# Patient Record
Sex: Female | Born: 1940
Health system: Southern US, Community
[De-identification: ages and names within clinical notes are randomized; demographics above are authoritative.]

## PROBLEM LIST (undated history)

## (undated) DIAGNOSIS — J301 Allergic rhinitis due to pollen: Secondary | ICD-10-CM

## (undated) DIAGNOSIS — H61019 Acute perichondritis of external ear, unspecified ear: Secondary | ICD-10-CM

## (undated) DIAGNOSIS — M899 Disorder of bone, unspecified: Secondary | ICD-10-CM

## (undated) DIAGNOSIS — Z79899 Other long term (current) drug therapy: Secondary | ICD-10-CM

## (undated) DIAGNOSIS — E039 Hypothyroidism, unspecified: Secondary | ICD-10-CM

## (undated) DIAGNOSIS — M949 Disorder of cartilage, unspecified: Secondary | ICD-10-CM

## (undated) DIAGNOSIS — F410 Panic disorder [episodic paroxysmal anxiety] without agoraphobia: Secondary | ICD-10-CM

## (undated) DIAGNOSIS — M1990 Unspecified osteoarthritis, unspecified site: Secondary | ICD-10-CM

## (undated) DIAGNOSIS — Z9289 Personal history of other medical treatment: Secondary | ICD-10-CM

## (undated) DIAGNOSIS — E785 Hyperlipidemia, unspecified: Secondary | ICD-10-CM

## (undated) DIAGNOSIS — L578 Other skin changes due to chronic exposure to nonionizing radiation: Secondary | ICD-10-CM

## (undated) HISTORY — DX: Disorder of cartilage, unspecified: M94.9

## (undated) HISTORY — DX: Other skin changes due to chronic exposure to nonionizing radiation: L57.8

## (undated) HISTORY — PX: OTHER SURGICAL HISTORY: SHX169

## (undated) HISTORY — DX: Allergic rhinitis due to pollen: J30.1

## (undated) HISTORY — DX: Personal history of other medical treatment: Z92.89

## (undated) HISTORY — DX: Hypothyroidism, unspecified: E03.9

## (undated) HISTORY — DX: Hyperlipidemia, unspecified: E78.5

## (undated) HISTORY — DX: Unspecified osteoarthritis, unspecified site: M19.90

## (undated) HISTORY — DX: Acute perichondritis of external ear, unspecified ear: H61.019

## (undated) HISTORY — DX: Other long term (current) drug therapy: Z79.899

## (undated) HISTORY — PX: COLONOSCOPY: SHX174

## (undated) HISTORY — DX: Panic disorder (episodic paroxysmal anxiety): F41.0

## (undated) HISTORY — DX: Disorder of bone, unspecified: M89.9

---
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
## 2009-01-03 LAB — HM DEXA SCAN

## 2011-02-03 LAB — HM MAMMOGRAPHY: HM Mammogram: NEGATIVE

## 2012-12-30 ENCOUNTER — Encounter: Payer: Self-pay | Admitting: *Deleted

## 2013-01-02 ENCOUNTER — Ambulatory Visit (INDEPENDENT_AMBULATORY_CARE_PROVIDER_SITE_OTHER): Payer: Medicare Other | Admitting: Internal Medicine

## 2013-01-02 ENCOUNTER — Other Ambulatory Visit: Payer: Self-pay | Admitting: *Deleted

## 2013-01-02 ENCOUNTER — Encounter: Payer: Self-pay | Admitting: Internal Medicine

## 2013-01-02 VITALS — BP 142/80 | HR 72 | Temp 97.5°F | Resp 18 | Ht 65.0 in | Wt 169.0 lb

## 2013-01-02 DIAGNOSIS — IMO0002 Reserved for concepts with insufficient information to code with codable children: Secondary | ICD-10-CM

## 2013-01-02 DIAGNOSIS — M170 Bilateral primary osteoarthritis of knee: Secondary | ICD-10-CM | POA: Insufficient documentation

## 2013-01-02 DIAGNOSIS — E785 Hyperlipidemia, unspecified: Secondary | ICD-10-CM | POA: Insufficient documentation

## 2013-01-02 DIAGNOSIS — M1710 Unilateral primary osteoarthritis, unspecified knee: Secondary | ICD-10-CM

## 2013-01-02 DIAGNOSIS — F329 Major depressive disorder, single episode, unspecified: Secondary | ICD-10-CM

## 2013-01-02 DIAGNOSIS — E039 Hypothyroidism, unspecified: Secondary | ICD-10-CM | POA: Insufficient documentation

## 2013-01-02 DIAGNOSIS — F3289 Other specified depressive episodes: Secondary | ICD-10-CM

## 2013-01-02 DIAGNOSIS — E559 Vitamin D deficiency, unspecified: Secondary | ICD-10-CM | POA: Insufficient documentation

## 2013-01-02 MED ORDER — LEVOTHYROXINE SODIUM 25 MCG PO TABS: ORAL_TABLET | ORAL | Status: DC

## 2013-01-02 NOTE — Progress Notes (Signed)
Patient ID: Patricia Cantu, female   DOB: 1941/05/01, 72 y.o.   MRN: [id number] Code Status: Has living will and husband, Rocky Link is designated HCPOA with her daughter as secondary  Allergies  Allergen Reactions  . Seasonal Ic (Cholestatin)     Chief Complaint  Patient presents with  . Knee Pain    for one month    HPI: Patient is a 72 y.o. female seen in the office today for bilateral knee pain for a month.  Used to be able to stoop down to weed.  A few days ago she was unable to get up without holding onto something.  Thinks now that maybe she overused her muscles during the snow with sledding with grandchildren.  Discussed whether this could be due to statin.  Knees also bothered her a little bit during her tai chi class.    Was concerned that her bp was at 142/80 here again today.  Has historically been in 120s.  Has not checked outside of the office--meant to but forgot.    Is up to date on pneumonia, tetanus and had her flu shot for the season.  No longer requires pap smears.  Is set up with [facility] to get her yellow fever, hepatitis series.    Review of Systems:  Review of Systems  Constitutional: Negative for fever, chills and weight loss.  Eyes: Negative for blurred vision.  Respiratory: Negative for cough and shortness of breath.   Cardiovascular: Negative for chest pain and palpitations.  Gastrointestinal: Negative for heartburn, nausea, vomiting, abdominal pain, diarrhea and constipation.  Genitourinary: Negative for dysuria, urgency and frequency.  Musculoskeletal: Positive for joint pain.  Skin: Negative for rash.       Only aging skin changes  Neurological: Negative for dizziness and headaches.  Psychiatric/Behavioral: Negative for depression.  All other systems reviewed and are negative.     Past Medical History  Diagnosis Date  . Acute perichondritis of pinna   . Unspecified hypothyroidism   . Mixed hyperlipidemia   . Anxiety state, unspecified   . Allergic  rhinitis due to pollen   . Unspecified dermatitis due to sun   . Disorder of bone and cartilage, unspecified   . Encounter for long-term (current) use of other medications    Past Surgical History  Procedure Laterality Date  . Tonsillectomy  1949    removed   Social History:   reports that she has never smoked. She has never used smokeless tobacco. She reports that she drinks about 1.2 ounces of alcohol per week. She reports that she does not use illicit drugs.  Family History  Problem Relation Age of Onset  . Stroke Mother   . Heart disease Father   . Hyperlipidemia Sister   . Heart disease Sister   . Thyroid disease Sister   . Heart disease Son   . Thyroid disease Son   . Thyroid disease Daughter     Medications: Patient's Medications  New Prescriptions   No medications on file  Previous Medications   ATORVASTATIN (LIPITOR) 10 MG TABLET    Take 10 mg by mouth daily. Take one tablet once a day at bedtime for cholesterol   CALCIUM CARBONATE 1250 MG CAPSULE    Take 1,250 mg by mouth 2 (two) times daily with a meal. Take one tablet once a day for calcium supplement   CHOLECALCIFEROL (VITAMIN D) 1000 UNITS TABLET    Take 1,000 Units by mouth daily.   CITALOPRAM (CELEXA) 20 MG TABLET  Take 20 mg by mouth daily. Take one tablet once a day for depression   FISH OIL-OMEGA-3 FATTY ACIDS 1000 MG CAPSULE    Take 2 g by mouth daily. Take one tablet once a day.   FLUTICASONE (FLONASE) 50 MCG/ACT NASAL SPRAY    Place 2 sprays into the nose daily. Use two sprays into each nostril daily   LEVOTHYROXINE (SYNTHROID, LEVOTHROID) 25 MCG TABLET    Take 25 mcg by mouth daily. Take one tablet twice a day for thyroid   MULTIPLE VITAMIN (MULTIVITAMIN) TABLET    Take 1 tablet by mouth daily. Take one tablet once a day  Modified Medications   No medications on file  Discontinued Medications   No medications on file     Physical Exam: Physical Exam  Constitutional: She is oriented to person,  place, and time. She appears well-developed and well-nourished. No distress.  HENT:  Head: Normocephalic and atraumatic.  Cardiovascular: Normal rate, regular rhythm, normal heart sounds and intact distal pulses.   Pulmonary/Chest: Effort normal and breath sounds normal.  Abdominal: Soft. Bowel sounds are normal.  Musculoskeletal: Normal range of motion. She exhibits no edema and no tenderness.  Bilateral knees with crepitus present but able to squat down and stand up again today without use of the counter.    Neurological: She is alert and oriented to person, place, and time.  Skin: Skin is warm and dry.  Psychiatric: She has a normal mood and affect. Her behavior is normal. Judgment and thought content normal.    Filed Vitals:   01/02/13 0853  BP: 142/80  Pulse: 72  Temp: 97.5 F (36.4 C)  TempSrc: Oral  Resp: 18  Height: 5\' 5"  (1.651 m)  Weight: 169 lb (76.658 kg)  SpO2: 99%    Labs reviewed:  All previous labs were in misys: 10/06/2012 CBC WBC 5.2, RBC 4.31, HGB 13.3 CMP; Glucose 86, BUN 22, Creatinine 0.99 Lipid Panel; Cholesterol 227, Triglycerides 113, HDL 63, LDL 141 TSH 2.750   Procedures:   2005-Colonoscopy--did have benign polyps, irritation, done in [place] 2010-Mammogram: Negative 03/08/2009-Pelvic Ultrasound: No significant abnormality identified on pelvic ultrasonography  2011-Pap Smear 01/2009-Bone Density- osteopenia 08/23/2010-Stress Test: No evidence of myocardial ischemia by exercise nuclear stress testing. Normal regional wall motion, calculated LVEF 66%. Exercise protocol reported separately. 2011-Echocardiogram 06/10/2010-Carotid Study: No evidence of significant stenosis of the internal carotid arteries. 02/2011-Mammogram: Negative 04/06/2012 - Mammogram  Assessment/Plan Osteoarthritis of both knees Increased stiffness after sledding with grandchildren.  Has now resolved.  I highly doubt this is statin-related due to its time-limited nature and lack of  associated pain.  Has known b/l knee osteoarthritis.  Is using aleve for the short-term for pain.  Should not be used long term  Hyperlipidemia LDL goal < 100 Continue on statin.  Recheck lipids as planned prior to July visit.  Has not been doing very well with her diet and exercise b/c she is saying she is stuck with the medicine anyway.  Says she will get back to it.  Unspecified vitamin D deficiency Continues on vitamin D.  Monitor levels.  Balance is fabulous between this and tai chi.    Unspecified hypothyroidism Continue synthroid po daily and f/u levels before routine visit in July.  Depressive disorder, not elsewhere classified Has improved dramatically, but she is afraid to go off her citalopram completely for fear of returning to having periods of anxiety and panic.  She has reduced to every other day with continued benefit and  no return of symptoms.     Labs/tests ordered:  CMP, FLP ordered before July visit from last routine visit.

## 2013-01-02 NOTE — Assessment & Plan Note (Signed)
Increased stiffness after sledding with grandchildren.  Has now resolved.  I highly doubt this is statin-related due to its time-limited nature and lack of associated pain.  Has known b/l knee osteoarthritis.  Is using aleve for the short-term for pain.  Should not be used long term

## 2013-01-02 NOTE — Assessment & Plan Note (Signed)
Continue synthroid po daily and f/u levels before routine visit in July.

## 2013-01-02 NOTE — Assessment & Plan Note (Signed)
Continues on vitamin D.  Monitor levels.  Balance is fabulous between this and tai chi.

## 2013-01-02 NOTE — Assessment & Plan Note (Signed)
Has improved dramatically, but she is afraid to go off her citalopram completely for fear of returning to having periods of anxiety and panic.  She has reduced to every other day with continued benefit and no return of symptoms.

## 2013-01-02 NOTE — Progress Notes (Deleted)
Patient ID: Patricia Cantu, female   DOB: 30-Nov-1940, 72 y.o.   MRN: [id number] Physical Exam

## 2013-01-02 NOTE — Assessment & Plan Note (Signed)
Continue on statin.  Recheck lipids as planned prior to July visit.  Has not been doing very well with her diet and exercise b/c she is saying she is stuck with the medicine anyway.  Says she will get back to it.

## 2013-02-10 ENCOUNTER — Other Ambulatory Visit: Payer: Self-pay | Admitting: *Deleted

## 2013-02-10 DIAGNOSIS — E782 Mixed hyperlipidemia: Secondary | ICD-10-CM

## 2013-02-10 DIAGNOSIS — Z0000 Encounter for general adult medical examination without abnormal findings: Secondary | ICD-10-CM

## 2013-02-10 DIAGNOSIS — E039 Hypothyroidism, unspecified: Secondary | ICD-10-CM

## 2013-02-17 ENCOUNTER — Ambulatory Visit (INDEPENDENT_AMBULATORY_CARE_PROVIDER_SITE_OTHER): Payer: Self-pay | Admitting: Internal Medicine

## 2013-02-17 DIAGNOSIS — Z789 Other specified health status: Secondary | ICD-10-CM

## 2013-02-17 DIAGNOSIS — Z23 Encounter for immunization: Secondary | ICD-10-CM

## 2013-02-17 MED ORDER — TYPHOID VACCINE PO CPDR: 1.0000 | DELAYED_RELEASE_CAPSULE | ORAL | Status: DC

## 2013-02-17 MED ORDER — AZITHROMYCIN 500 MG PO TABS: 500.0000 mg | ORAL_TABLET | Freq: Every day | ORAL | Status: DC

## 2013-02-17 NOTE — Progress Notes (Signed)
[facility] NOTE  RFV: [place] and [place] trip inc 14 day cruise Subjective:    Patient ID: Patricia Cantu, female    DOB: 1940-11-05, 72 y.o.   MRN: [id number]  HPI 72yo F leaving with her husband for a trip to [place] and [place] from oct 24th throu nov 10th, which includes a 14 day cruise.  Previous vac: flu, hep b  All: nkma Meds: citalopram, levothyroxin, artovastatin, mvi  Previous travel [place] [address], [place], [place], [place], [place], [place], [place], [place], [place], [place], [place],     Review of Systems     Objective:   Physical Exam        Assessment & Plan:  Provided pre travel counseling plus hep A and typhoid vaccination. No need for malaria or yellow fever  Traveler's diarrhea = gave rx for azithromycin and gave tips sheet

## 2013-02-20 ENCOUNTER — Encounter: Payer: Self-pay | Admitting: Internal Medicine

## 2013-02-20 ENCOUNTER — Ambulatory Visit (INDEPENDENT_AMBULATORY_CARE_PROVIDER_SITE_OTHER): Payer: Medicare Other | Admitting: Internal Medicine

## 2013-02-20 VITALS — BP 140/80 | HR 78 | Temp 98.2°F | Resp 18 | Ht 65.5 in | Wt 169.0 lb

## 2013-02-20 DIAGNOSIS — R55 Syncope and collapse: Secondary | ICD-10-CM

## 2013-02-20 DIAGNOSIS — I491 Atrial premature depolarization: Secondary | ICD-10-CM | POA: Insufficient documentation

## 2013-02-20 DIAGNOSIS — I499 Cardiac arrhythmia, unspecified: Secondary | ICD-10-CM

## 2013-02-20 DIAGNOSIS — T671XXA Heat syncope, initial encounter: Secondary | ICD-10-CM

## 2013-02-20 NOTE — Assessment & Plan Note (Signed)
Noted on EKG.  Doubt this is related to her near syncopal episode.  She is currently asymptomatic and does not even note palpitations.  Q-T interval was normal.

## 2013-02-20 NOTE — Assessment & Plan Note (Signed)
Suspect this was related to heat, overactivity and hypovolemia at the time.  No carotid bruits were noted.  I did note PACs during cardiac exam and EKG was done that revealed the same.  I encouraged hydration and not overdoing it when she goes to the gym.  She is to notify me if she has the return of any of the visual changes and feeling that she has to sit down.

## 2013-02-20 NOTE — Progress Notes (Signed)
Patient ID: Patricia Cantu, female   DOB: 1941-03-11, 72 y.o.   MRN: [id number] Code Status:  Has living will, husband is HCPOA  Allergies  Allergen Reactions  . Seasonal Ic (Cholestatin)     Chief Complaint  Patient presents with  . Acute Visit    hot spell and tunnel vision after exercising    HPI: Patient is a 72 y.o. white female seen in the office today for acute visit with hot spell and tunnel vision after exercising.  Walked 3 miles, worked in yard, ate, didn't drink a lot, went to tai chi ball, then a lot of upper body at her regular 45 min tai chi class at the club at [facility].  Felt like she had a black cloud around her eyes.  Went to bathroom to cool down.  Went back and it happened again.  Drank OJ and ate, sat a while. They wanted her to see someone about it.  They didn't have a bp cuff.  No dizziness, no pain.  No visual changes otherwise.  No speech changes.    Review of Systems:  Review of Systems  Constitutional: Negative for fever, chills, weight loss, malaise/fatigue and diaphoresis.  Eyes: Negative for blurred vision and double vision.  Respiratory: Negative for shortness of breath.   Cardiovascular: Negative for chest pain, palpitations, orthopnea, leg swelling and PND.  Gastrointestinal: Negative for constipation.  Genitourinary: Negative for dysuria.  Musculoskeletal: Negative for myalgias and falls.  Skin: Negative for rash.  Neurological: Negative for dizziness, loss of consciousness, weakness and headaches.       Presyncope  Psychiatric/Behavioral: Negative for depression and memory loss. The patient is not nervous/anxious and does not have insomnia.      Past Medical History  Diagnosis Date  . Acute perichondritis of pinna   . Unspecified hypothyroidism   . Mixed hyperlipidemia   . Anxiety state, unspecified   . Allergic rhinitis due to pollen   . Unspecified dermatitis due to sun   . Disorder of bone and cartilage, unspecified   . Encounter for  long-term (current) use of other medications    Past Surgical History  Procedure Laterality Date  . Tonsillectomy  1949    removed   Social History:   reports that she has never smoked. She has never used smokeless tobacco. She reports that she drinks about 1.2 ounces of alcohol per week. She reports that she does not use illicit drugs.  Family History  Problem Relation Age of Onset  . Stroke Mother   . Heart disease Father   . Hyperlipidemia Sister   . Heart disease Sister   . Thyroid disease Sister   . Heart disease Son   . Thyroid disease Son   . Thyroid disease Daughter     Medications: Patient's Medications  New Prescriptions   No medications on file  Previous Medications   ATORVASTATIN (LIPITOR) 10 MG TABLET    Take 10 mg by mouth daily. Take one tablet once a day at bedtime for cholesterol   AZITHROMYCIN (ZITHROMAX) 500 MG TABLET    Take 1 tablet (500 mg total) by mouth daily.   CALCIUM CARBONATE 1250 MG CAPSULE    Take 1,250 mg by mouth 2 (two) times daily with a meal. Take one tablet once a day for calcium supplement   CHOLECALCIFEROL (VITAMIN D) 1000 UNITS TABLET    Take 1,000 Units by mouth daily.   CITALOPRAM (CELEXA) 20 MG TABLET    Take 20 mg by  mouth daily. Take one tablet once a day for depression   FISH OIL-OMEGA-3 FATTY ACIDS 1000 MG CAPSULE    Take 2 g by mouth daily. Take one tablet once a day.   FLUTICASONE (FLONASE) 50 MCG/ACT NASAL SPRAY    Place 2 sprays into the nose daily. Use two sprays into each nostril daily   MULTIPLE VITAMIN (MULTIVITAMIN) TABLET    Take 1 tablet by mouth daily. Take one tablet once a day   TYPHOID (VIVOTIF BERNA VACCINE) DR CAPSULE    Take 1 capsule by mouth every other day.  Modified Medications   Modified Medication Previous Medication   LEVOTHYROXINE (SYNTHROID, LEVOTHROID) 25 MCG TABLET levothyroxine (SYNTHROID, LEVOTHROID) 25 MCG tablet      Take one and one half tablet once a day for thyroid    Take one tablet twice a day  for thyroid  Discontinued Medications   No medications on file    Physical Exam:  Filed Vitals:   02/20/13 1157  BP: 140/80  Pulse: 78  Temp: 98.2 F (36.8 C)  TempSrc: Oral  Resp: 18  Height: 5' 5.5" (1.664 m)  Weight: 169 lb (76.658 kg)  SpO2: 97%   Physical Exam  Constitutional: She is oriented to person, place, and time. She appears well-developed and well-nourished. No distress.  HENT:  Head: Normocephalic and atraumatic.  Eyes: Pupils are equal, round, and reactive to light.  Neck: No JVD present.  Cardiovascular: Normal rate, normal heart sounds and intact distal pulses.   No carotid bruits audible,  PACs audible  Pulmonary/Chest: Effort normal and breath sounds normal. No respiratory distress. She has no wheezes. She has no rales. She exhibits no tenderness.  Abdominal: Soft. Bowel sounds are normal.  Musculoskeletal: Normal range of motion.  Neurological: She is alert and oriented to person, place, and time. She has normal reflexes. No cranial nerve deficit.  Skin: Skin is warm and dry.  Psychiatric: She has a normal mood and affect. Her behavior is normal. Judgment and thought content normal.   Assessment/Plan Heat causing syncope Suspect this was related to heat, overactivity and hypovolemia at the time.  No carotid bruits were noted.  I did note PACs during cardiac exam and EKG was done that revealed the same.  I encouraged hydration and not overdoing it when she goes to the gym.  She is to notify me if she has the return of any of the visual changes and feeling that she has to sit down.    Premature atrial complexes Noted on EKG.  Doubt this is related to her near syncopal episode.  She is currently asymptomatic and does not even note palpitations.  Q-T interval was normal.   Labs/tests ordered:  EKG was done here today.  No further testing ordered at present.  Keep routine appt in July.

## 2013-03-06 ENCOUNTER — Other Ambulatory Visit: Payer: Self-pay | Admitting: *Deleted

## 2013-03-06 MED ORDER — ATORVASTATIN CALCIUM 10 MG PO TABS: 10.0000 mg | ORAL_TABLET | Freq: Every day | ORAL | Status: DC

## 2013-04-25 ENCOUNTER — Other Ambulatory Visit: Payer: Medicare Other

## 2013-04-25 ENCOUNTER — Other Ambulatory Visit: Payer: Self-pay | Admitting: Internal Medicine

## 2013-04-27 ENCOUNTER — Encounter: Payer: Self-pay | Admitting: Internal Medicine

## 2013-04-27 ENCOUNTER — Ambulatory Visit (INDEPENDENT_AMBULATORY_CARE_PROVIDER_SITE_OTHER): Payer: Medicare Other | Admitting: Internal Medicine

## 2013-04-27 VITALS — BP 138/78 | HR 64 | Temp 98.2°F | Resp 18 | Ht 65.5 in | Wt 165.8 lb

## 2013-04-27 DIAGNOSIS — E785 Hyperlipidemia, unspecified: Secondary | ICD-10-CM

## 2013-04-27 DIAGNOSIS — E039 Hypothyroidism, unspecified: Secondary | ICD-10-CM

## 2013-04-27 MED ORDER — ATORVASTATIN CALCIUM 10 MG PO TABS: 10.0000 mg | ORAL_TABLET | Freq: Every day | ORAL | Status: DC

## 2013-04-27 NOTE — Progress Notes (Signed)
Patient ID: Patricia Cantu, female   DOB: 05-05-1941, 72 y.o.   MRN: [id number] Location:  [facility] [place]  Code Status: Has living will and [name] is husband  Allergies  Allergen Reactions  . Seasonal Ic (Cholestatin)     Chief Complaint  Patient presents with  . Follow-up    HPI: Patient is a 72 y.o. white female seen in the office today for routine med mgt of chronic conditions. Had mammogram Monday--was normal. Had precancerous place on left cheek removed.   Lost a few lbs.  Goal is 155 lbs, she says.   BP borderline.   Review of Systems:  Review of Systems  Constitutional: Positive for weight loss. Negative for fever, chills and malaise/fatigue.  HENT: Negative for congestion.   Eyes: Negative for blurred vision.  Respiratory: Negative for shortness of breath.   Cardiovascular: Positive for palpitations. Negative for chest pain and leg swelling.  Gastrointestinal: Negative for abdominal pain, diarrhea, constipation, blood in stool and melena.  Genitourinary: Negative for dysuria.  Musculoskeletal: Negative for myalgias, back pain and falls.       Left foot toes overlap   Skin: Negative for rash.  Neurological: Negative for dizziness, weakness and headaches.  Psychiatric/Behavioral: Negative for depression and memory loss. The patient is not nervous/anxious and does not have insomnia.     Past Medical History  Diagnosis Date  . Acute perichondritis of pinna   . Unspecified hypothyroidism   . Mixed hyperlipidemia   . Anxiety state, unspecified   . Allergic rhinitis due to pollen   . Unspecified dermatitis due to sun   . Disorder of bone and cartilage, unspecified   . Encounter for long-term (current) use of other medications     Past Surgical History  Procedure Laterality Date  . Tonsillectomy  1949    removed    Social History:   reports that she has never smoked. She has never used smokeless tobacco. She reports that she  drinks about 1.2 ounces of alcohol per week. She reports that she does not use illicit drugs.  Family History  Problem Relation Age of Onset  . Stroke Mother   . Heart disease Father   . Hyperlipidemia Sister   . Heart disease Sister   . Thyroid disease Sister   . Heart disease Son   . Thyroid disease Son   . Thyroid disease Daughter     Medications: Patient's Medications  New Prescriptions   No medications on file  Previous Medications   ATORVASTATIN (LIPITOR) 10 MG TABLET    Take 1 tablet (10 mg total) by mouth daily. For cholesterol   AZITHROMYCIN (ZITHROMAX) 500 MG TABLET    Take 1 tablet (500 mg total) by mouth daily.   CALCIUM CARBONATE 1250 MG CAPSULE    Take 1,250 mg by mouth 2 (two) times daily with a meal. Take one tablet once a day for calcium supplement   CHOLECALCIFEROL (VITAMIN D) 1000 UNITS TABLET    Take 1,000 Units by mouth daily.   CITALOPRAM (CELEXA) 20 MG TABLET    Take 20 mg by mouth daily. Take one tablet once a day for depression   FISH OIL-OMEGA-3 FATTY ACIDS 1000 MG CAPSULE    Take 2 g by mouth daily. Take one tablet once a day.   FLUTICASONE (FLONASE) 50 MCG/ACT NASAL SPRAY    Place 2 sprays into the nose daily. Use two sprays into each nostril daily   LEVOTHYROXINE (SYNTHROID, LEVOTHROID) 25  MCG TABLET    Take one and one half tablet once a day for thyroid   MULTIPLE VITAMIN (MULTIVITAMIN) TABLET    Take 1 tablet by mouth daily. Take one tablet once a day   TYPHOID (VIVOTIF BERNA VACCINE) DR CAPSULE    Take 1 capsule by mouth every other day.  Modified Medications   No medications on file  Discontinued Medications   No medications on file     Physical Exam: Filed Vitals:   04/27/13 0821  BP: 138/78  Pulse: 64  Temp: 98.2 F (36.8 C)  TempSrc: Oral  Resp: 18  Height: 5' 5.5" (1.664 m)  Weight: 165 lb 12.8 oz (75.206 kg)  SpO2: 99%  Physical Exam  Constitutional: She is oriented to person, place, and time. She appears well-developed and  well-nourished. No distress.  HENT:  Head: Normocephalic and atraumatic.  Cardiovascular: Normal rate, regular rhythm, normal heart sounds and intact distal pulses.   Pulmonary/Chest: Effort normal and breath sounds normal. No respiratory distress.  Abdominal: Soft. Bowel sounds are normal. She exhibits no distension. There is no tenderness.  Musculoskeletal: Normal range of motion. She exhibits no edema and no tenderness.  Left second toe overlaps first  Neurological: She is alert and oriented to person, place, and time.  Skin: Skin is warm and dry.  Psychiatric: She has a normal mood and affect.   Labs reviewed:   04/25/13:  Cbc nl, cmp nl, FLP:  Tc 166, TG 133, HDL 65, LDL 74  Past Procedures: Mammogram Mon, 7/21--normal  Assessment/Plan 1. Hyperlipidemia LDL goal < 100 -at goal with medication, exercise - atorvastatin (LIPITOR) 10 MG tablet; Take 1 tablet (10 mg total) by mouth daily. For cholesterol  Dispense: 90 tablet; Refill: 5 - Basic metabolic panel; Future  2. Unspecified hypothyroidism - continue synthroid - TSH; Future  Labs/tests ordered:  Bmp, tsh before next visit Next appt:  6 mos

## 2013-05-04 LAB — COMPREHENSIVE METABOLIC PANEL
ALT: 14 IU/L (ref 0–32)
AST: 21 IU/L (ref 0–40)
Albumin/Globulin Ratio: 1.9 (ref 1.1–2.5)
Albumin: 4.1 g/dL (ref 3.5–4.8)
Alkaline Phosphatase: 54 IU/L (ref 39–117)
BUN/Creatinine Ratio: 20 (ref 11–26)
BUN: 20 mg/dL (ref 8–27)
CO2: 25 mmol/L (ref 18–29)
Calcium: 9.3 mg/dL (ref 8.6–10.2)
Chloride: 101 mmol/L (ref 97–108)
Creatinine, Ser: 0.99 mg/dL (ref 0.57–1.00)
GFR calc Af Amer: 67 mL/min/{1.73_m2} (ref >59)
GFR calc non Af Amer: 58 mL/min/{1.73_m2} — ABNORMAL LOW (ref >59)
Globulin, Total: 2.2 g/dL (ref 1.5–4.5)
Glucose: 82 mg/dL (ref 65–99)
Potassium: 4.3 mmol/L (ref 3.5–5.2)
Sodium: 140 mmol/L (ref 134–144)
Total Bilirubin: 0.7 mg/dL (ref 0.0–1.2)
Total Protein: 6.3 g/dL (ref 6.0–8.5)

## 2013-05-04 LAB — CBC WITH DIFFERENTIAL
Basophils Absolute: 0 10*3/uL (ref 0.0–0.2)
Basos: 1 % (ref 0–3)
Eos: 3 % (ref 0–5)
Eosinophils Absolute: 0.2 10*3/uL (ref 0.0–0.4)
HCT: 41 % (ref 34.0–46.6)
Hemoglobin: 13.9 g/dL (ref 11.1–15.9)
Immature Grans (Abs): 0 10*3/uL (ref 0.0–0.1)
Immature Granulocytes: 0 % (ref 0–2)
Lymphocytes Absolute: 1.9 10*3/uL (ref 0.7–3.1)
Lymphs: 24 % (ref 14–46)
MCH: 31.4 pg (ref 26.6–33.0)
MCHC: 33.9 g/dL (ref 31.5–35.7)
MCV: 93 fL (ref 79–97)
Monocytes Absolute: 0.8 10*3/uL (ref 0.1–0.9)
Monocytes: 10 % (ref 4–12)
Neutrophils Absolute: 4.8 10*3/uL (ref 1.4–7.0)
Neutrophils Relative %: 62 % (ref 40–74)
Platelets: 256 10*3/uL (ref 150–379)
RBC: 4.42 x10E6/uL (ref 3.77–5.28)
RDW: 13.8 % (ref 12.3–15.4)
WBC: 7.7 10*3/uL (ref 3.4–10.8)

## 2013-05-04 LAB — LIPID PANEL WITH LDL/HDL RATIO
Cholesterol, Total: 166 mg/dL (ref 100–199)
HDL: 65 mg/dL (ref >39)
LDL Calculated: 74 mg/dL (ref 0–99)
LDl/HDL Ratio: 1.1 ratio units (ref 0.0–3.2)
Triglycerides: 133 mg/dL (ref 0–149)
VLDL Cholesterol Cal: 27 mg/dL (ref 5–40)

## 2013-05-16 ENCOUNTER — Encounter: Payer: Self-pay | Admitting: Internal Medicine

## 2013-06-13 ENCOUNTER — Ambulatory Visit: Payer: Medicare Other

## 2013-06-15 ENCOUNTER — Ambulatory Visit (INDEPENDENT_AMBULATORY_CARE_PROVIDER_SITE_OTHER): Payer: Medicare Other

## 2013-06-15 DIAGNOSIS — Z23 Encounter for immunization: Secondary | ICD-10-CM

## 2013-09-11 ENCOUNTER — Other Ambulatory Visit: Payer: Self-pay | Admitting: *Deleted

## 2013-09-11 ENCOUNTER — Other Ambulatory Visit: Payer: Self-pay | Admitting: Internal Medicine

## 2013-09-11 MED ORDER — LEVOTHYROXINE SODIUM 25 MCG PO TABS: ORAL_TABLET | ORAL | Status: DC

## 2013-09-21 ENCOUNTER — Other Ambulatory Visit: Payer: Self-pay | Admitting: Nurse Practitioner

## 2013-10-10 ENCOUNTER — Other Ambulatory Visit: Payer: Medicare Other

## 2013-10-12 ENCOUNTER — Ambulatory Visit: Payer: Medicare Other | Admitting: Internal Medicine

## 2013-11-21 ENCOUNTER — Other Ambulatory Visit: Payer: Medicare Other

## 2013-11-23 ENCOUNTER — Ambulatory Visit (INDEPENDENT_AMBULATORY_CARE_PROVIDER_SITE_OTHER): Payer: Medicare Other | Admitting: Internal Medicine

## 2013-11-23 ENCOUNTER — Encounter: Payer: Self-pay | Admitting: Internal Medicine

## 2013-11-23 ENCOUNTER — Ambulatory Visit: Payer: Medicare Other | Admitting: Internal Medicine

## 2013-11-23 VITALS — BP 138/82 | HR 93 | Temp 99.0°F | Resp 18 | Ht 65.5 in | Wt 173.0 lb

## 2013-11-23 DIAGNOSIS — F3289 Other specified depressive episodes: Secondary | ICD-10-CM

## 2013-11-23 DIAGNOSIS — E785 Hyperlipidemia, unspecified: Secondary | ICD-10-CM

## 2013-11-23 DIAGNOSIS — B379 Candidiasis, unspecified: Secondary | ICD-10-CM

## 2013-11-23 DIAGNOSIS — E039 Hypothyroidism, unspecified: Secondary | ICD-10-CM

## 2013-11-23 DIAGNOSIS — G4700 Insomnia, unspecified: Secondary | ICD-10-CM | POA: Insufficient documentation

## 2013-11-23 DIAGNOSIS — E663 Overweight: Secondary | ICD-10-CM

## 2013-11-23 DIAGNOSIS — M1710 Unilateral primary osteoarthritis, unspecified knee: Secondary | ICD-10-CM

## 2013-11-23 DIAGNOSIS — M170 Bilateral primary osteoarthritis of knee: Secondary | ICD-10-CM

## 2013-11-23 DIAGNOSIS — E559 Vitamin D deficiency, unspecified: Secondary | ICD-10-CM

## 2013-11-23 DIAGNOSIS — F329 Major depressive disorder, single episode, unspecified: Secondary | ICD-10-CM

## 2013-11-23 DIAGNOSIS — IMO0002 Reserved for concepts with insufficient information to code with codable children: Secondary | ICD-10-CM

## 2013-11-23 MED ORDER — ZOLPIDEM TARTRATE 5 MG PO TABS: 2.5000 mg | ORAL_TABLET | Freq: Every evening | ORAL | Status: DC | PRN

## 2013-11-23 MED ORDER — NYSTATIN 100000 UNIT/GM EX CREA: 1.0000 "application " | TOPICAL_CREAM | Freq: Two times a day (BID) | CUTANEOUS | Status: DC

## 2013-11-23 NOTE — Progress Notes (Signed)
Patient ID: Patricia Cantu, female   DOB: 1941/04/12, 73 y.o.   MRN: [id number]   Location:  [facility]  Code Status: has living will and hcpoa--reviewed today  Allergies  Allergen Reactions  . Seasonal Ic [Cholestatin]     Chief Complaint  Patient presents with  . Medical Managment of Chronic Issues  . Acute Visit    needs sleeping pill    HPI: Patient is a 73 y.o. white female seen in the office today for medical mgt of chronic diseases.  Feels fine. Is doing well.  Had a bug, but got rid of it.   Asks about laser hair removal. Asks about rashes from bras.  Discussed nystatin cream. Left second toe hammer toe. Weight up a little, but is using fitbit--doing 10000 steps a day (5x per week), losing weight and eating right.  Was up 5 lbs over the holiday.   Did not sleep on plane on way back Benadryl and pms don't work Going to [place] next week, and wants to take a 1/2 [place] and see if she can sleep.  Needs to help with tour of people.   Concerned about lack of intimacy at this point.   Rarely using celexa now, but feels like walking more is helping her.  Sometimes uses 3 1/2 pills, sometimes none at all.   Needs physical next time. Knees improved with walking more and doing more knee-focused tai chi.  Going down steps is sometimes painful.  Review of Systems:  Review of Systems  Constitutional: Negative for fever, chills, weight loss and malaise/fatigue.  HENT: Negative for hearing loss.   Eyes: Negative for blurred vision.  Respiratory: Negative for shortness of breath.   Cardiovascular: Negative for chest pain.  Gastrointestinal: Negative for constipation.  Genitourinary: Negative for dysuria, urgency and frequency.  Musculoskeletal: Positive for joint pain. Negative for falls.       Knees  Skin: Negative for rash.  Neurological: Negative for dizziness and weakness.  Psychiatric/Behavioral: Negative for depression and memory  loss. The patient has insomnia.        During travel    Past Medical History  Diagnosis Date  . Acute perichondritis of pinna   . Unspecified hypothyroidism   . Mixed hyperlipidemia   . Anxiety state, unspecified   . Allergic rhinitis due to pollen   . Unspecified dermatitis due to sun   . Disorder of bone and cartilage, unspecified   . Encounter for long-term (current) use of other medications     Past Surgical History  Procedure Laterality Date  . Tonsillectomy  1949    removed    Social History:   reports that she has never smoked. She has never used smokeless tobacco. She reports that she drinks about 1.2 ounces of alcohol per week. She reports that she does not use illicit drugs.  Family History  Problem Relation Age of Onset  . Stroke Mother   . Heart disease Father   . Hyperlipidemia Sister   . Heart disease Sister   . Thyroid disease Sister   . Heart disease Son   . Thyroid disease Son   . Thyroid disease Daughter     Medications: Patient's Medications  New Prescriptions   NYSTATIN CREAM (MYCOSTATIN)    Apply 1 application topically 2 (two) times daily. To area beneath breasts as needed for rash   ZOLPIDEM (AMBIEN) 5 MG TABLET    Take 0.5 tablets (2.5 mg total) by mouth at  bedtime as needed for sleep.  Previous Medications   ATORVASTATIN (LIPITOR) 10 MG TABLET    Take 1 tablet (10 mg total) by mouth daily. For cholesterol   CALCIUM CARBONATE 1250 MG CAPSULE    Take 1,250 mg by mouth 2 (two) times daily with a meal. Take one tablet once a day for calcium supplement   CHOLECALCIFEROL (VITAMIN D) 1000 UNITS TABLET    Take 1,000 Units by mouth daily.   CITALOPRAM (CELEXA) 20 MG TABLET    TAKE 1 TABLET BY MOUTH ONCE DAILY.   FISH OIL-OMEGA-3 FATTY ACIDS 1000 MG CAPSULE    Take 2 g by mouth daily. Take one tablet once a day.   FLUTICASONE (FLONASE) 50 MCG/ACT NASAL SPRAY    INSTILL 2 SPRAYS IN EACH NOSTRIL ONCE DAILY.   LEVOTHYROXINE (SYNTHROID, LEVOTHROID) 25 MCG  TABLET    Take one and one half tablet once a day for thyroid   MULTIPLE VITAMIN (MULTIVITAMIN) TABLET    Take 1 tablet by mouth daily. Take one tablet once a day  Modified Medications   No medications on file  Discontinued Medications   AZITHROMYCIN (ZITHROMAX) 500 MG TABLET    Take 1 tablet (500 mg total) by mouth daily.   TYPHOID (VIVOTIF BERNA VACCINE) DR CAPSULE    Take 1 capsule by mouth every other day.     Physical Exam: Filed Vitals:   11/23/13 0738  BP: 138/82  Pulse: 93  Temp: 99 F (37.2 C)  TempSrc: Oral  Resp: 18  Height: 5' 5.5" (1.664 m)  Weight: 173 lb (78.472 kg)  SpO2: 96%  Physical Exam  Constitutional: She is oriented to person, place, and time. She appears well-developed and well-nourished. No distress.  Cardiovascular: Normal rate, regular rhythm, normal heart sounds and intact distal pulses.   Pulmonary/Chest: Effort normal and breath sounds normal. No respiratory distress.  Musculoskeletal: Normal range of motion. She exhibits no edema and no tenderness.  Hammer toe left second toe onto left great toe; crepitus of bilateral knees  Neurological: She is alert and oriented to person, place, and time.  Skin: Skin is warm and dry. There is pallor.  Psychiatric: She has a normal mood and affect.    Labs reviewed: Basic Metabolic Panel:  Recent Labs  04/25/13 0818  NA 140  K 4.3  CL 101  CO2 25  GLUCOSE 82  BUN 20  CREATININE 0.99  CALCIUM 9.3   Liver Function Tests:  Recent Labs  04/25/13 0818  AST 21  ALT 14  ALKPHOS 54  BILITOT 0.7  PROT 6.3   CBC:  Recent Labs  04/25/13 0818  WBC 7.7  NEUTROABS 4.8  HGB 13.9  HCT 41.0  MCV 93  PLT 256   Lipid Panel:  Recent Labs  04/25/13 0818  HDL 65  LDLCALC 74  TRIG 133   Past Procedures: Mammogram normal  Assessment/Plan Osteoarthritis of both knees Doing much better with increased walking.  Continue this for lubrication and cont tai chi also.    Hyperlipidemia LDL goal <  100 F/u flp today.  Had a bit of weight gain over the holidays but is now back on her diet. Cont atorvastatin.  Unspecified vitamin D deficiency F/u level today.  Con ca with D, and vit D 1000 units daily.  May add another 1000 units if levels remain below 40.  Unspecified hypothyroidism F/u tsh.  Continue current dose of synthroid.  Depressive disorder, not elsewhere classified Mood has been quite good  since walking more also and has not needed celexa.  She may stop this altogether due to rare use anyway--reviewed that this is not typically effective prn anyway.  Insomnia Unable to sleep on the plane when traveling to other countries--has upcoming trip where she needs to be alert and help with the tour in [place].  She requests just a few ambien tablets to take for that purpose only--otherwise sleeps well.     Labs/tests ordered:   Orders Placed This Encounter  Procedures  . HM MAMMOGRAPHY    This external order was created through the Results Console.  Marland Kitchen HM DEXA SCAN    This external order was created through the Results Console.  . TSH  . Lipid panel    Order Specific Question:  Has the patient fasted?    Answer:  Yes  . Hemoglobin A1c  . Comprehensive metabolic panel    Order Specific Question:  Has the patient fasted?    Answer:  Yes  . CBC with Differential  . Vitamin D, 25-hydroxy   Next appt:  6 months EV

## 2013-11-23 NOTE — Assessment & Plan Note (Signed)
F/u level today.  Con ca with D, and vit D 1000 units daily.  May add another 1000 units if levels remain below 40.

## 2013-11-23 NOTE — Assessment & Plan Note (Signed)
Unable to sleep on the plane when traveling to other countries--has upcoming trip where she needs to be alert and help with the tour in [place].  She requests just a few ambien tablets to take for that purpose only--otherwise sleeps well.

## 2013-11-23 NOTE — Assessment & Plan Note (Addendum)
F/u flp today.  Had a bit of weight gain over the holidays but is now back on her diet. Cont atorvastatin.

## 2013-11-23 NOTE — Assessment & Plan Note (Signed)
F/u tsh.  Continue current dose of synthroid.

## 2013-11-23 NOTE — Assessment & Plan Note (Signed)
Mood has been quite good since walking more also and has not needed celexa.  She may stop this altogether due to rare use anyway--reviewed that this is not typically effective prn anyway.

## 2013-11-23 NOTE — Assessment & Plan Note (Signed)
Doing much better with increased walking.  Continue this for lubrication and cont tai chi also.

## 2013-11-24 LAB — CBC WITH DIFFERENTIAL/PLATELET
Basophils Absolute: 0 10*3/uL (ref 0.0–0.2)
Basos: 1 %
Eos: 3 %
Eosinophils Absolute: 0.2 10*3/uL (ref 0.0–0.4)
HCT: 39.8 % (ref 34.0–46.6)
Hemoglobin: 13.6 g/dL (ref 11.1–15.9)
Immature Grans (Abs): 0 10*3/uL (ref 0.0–0.1)
Immature Granulocytes: 0 %
Lymphocytes Absolute: 1.4 10*3/uL (ref 0.7–3.1)
Lymphs: 23 %
MCH: 31.1 pg (ref 26.6–33.0)
MCHC: 34.2 g/dL (ref 31.5–35.7)
MCV: 91 fL (ref 79–97)
Monocytes Absolute: 0.8 10*3/uL (ref 0.1–0.9)
Monocytes: 13 %
Neutrophils Absolute: 3.7 10*3/uL (ref 1.4–7.0)
Neutrophils Relative %: 60 %
RBC: 4.38 x10E6/uL (ref 3.77–5.28)
RDW: 13.8 % (ref 12.3–15.4)
WBC: 6.1 10*3/uL (ref 3.4–10.8)

## 2013-11-24 LAB — COMPREHENSIVE METABOLIC PANEL
ALT: 10 IU/L (ref 0–32)
AST: 14 IU/L (ref 0–40)
Albumin/Globulin Ratio: 2 (ref 1.1–2.5)
Albumin: 4.1 g/dL (ref 3.5–4.8)
Alkaline Phosphatase: 62 IU/L (ref 39–117)
BUN/Creatinine Ratio: 15 (ref 11–26)
BUN: 14 mg/dL (ref 8–27)
CO2: 26 mmol/L (ref 18–29)
Calcium: 9.1 mg/dL (ref 8.7–10.3)
Chloride: 97 mmol/L (ref 97–108)
Creatinine, Ser: 0.96 mg/dL (ref 0.57–1.00)
GFR calc Af Amer: 69 mL/min/{1.73_m2} (ref >59)
GFR calc non Af Amer: 60 mL/min/{1.73_m2} (ref >59)
Globulin, Total: 2.1 g/dL (ref 1.5–4.5)
Glucose: 93 mg/dL (ref 65–99)
Potassium: 4.4 mmol/L (ref 3.5–5.2)
Sodium: 138 mmol/L (ref 134–144)
Total Bilirubin: 0.4 mg/dL (ref 0.0–1.2)
Total Protein: 6.2 g/dL (ref 6.0–8.5)

## 2013-11-24 LAB — LIPID PANEL
Chol/HDL Ratio: 2.7 ratio units (ref 0.0–4.4)
Cholesterol, Total: 167 mg/dL (ref 100–199)
HDL: 62 mg/dL (ref >39)
LDL Calculated: 68 mg/dL (ref 0–99)
Triglycerides: 184 mg/dL — ABNORMAL HIGH (ref 0–149)
VLDL Cholesterol Cal: 37 mg/dL (ref 5–40)

## 2013-11-24 LAB — TSH: TSH: 2.12 u[IU]/mL (ref 0.450–4.500)

## 2013-11-24 LAB — VITAMIN D 25 HYDROXY (VIT D DEFICIENCY, FRACTURES): Vit D, 25-Hydroxy: 29.1 ng/mL — ABNORMAL LOW (ref 30.0–100.0)

## 2013-11-24 LAB — HEMOGLOBIN A1C
Est. average glucose Bld gHb Est-mCnc: 120 mg/dL
Hgb A1c MFr Bld: 5.8 % — ABNORMAL HIGH (ref 4.8–5.6)

## 2013-12-13 ENCOUNTER — Other Ambulatory Visit: Payer: Medicare Other

## 2013-12-15 ENCOUNTER — Ambulatory Visit: Payer: Medicare Other | Admitting: Internal Medicine

## 2014-01-02 ENCOUNTER — Ambulatory Visit: Payer: Medicare Other

## 2014-01-18 ENCOUNTER — Ambulatory Visit (INDEPENDENT_AMBULATORY_CARE_PROVIDER_SITE_OTHER): Payer: Medicare Other | Admitting: *Deleted

## 2014-01-18 DIAGNOSIS — Z23 Encounter for immunization: Secondary | ICD-10-CM

## 2014-03-13 ENCOUNTER — Encounter (HOSPITAL_COMMUNITY): Payer: Self-pay | Admitting: Emergency Medicine

## 2014-03-13 ENCOUNTER — Emergency Department (HOSPITAL_COMMUNITY)
Admission: EM | Admit: 2014-03-13 | Discharge: 2014-03-13 | Disposition: A | Discharge status: Home / Self Care | Payer: Medicare Other | Attending: Emergency Medicine | Admitting: Emergency Medicine

## 2014-03-13 DIAGNOSIS — R110 Nausea: Secondary | ICD-10-CM | POA: Insufficient documentation

## 2014-03-13 DIAGNOSIS — R55 Syncope and collapse: Secondary | ICD-10-CM

## 2014-03-13 DIAGNOSIS — Z872 Personal history of diseases of the skin and subcutaneous tissue: Secondary | ICD-10-CM | POA: Insufficient documentation

## 2014-03-13 DIAGNOSIS — E860 Dehydration: Secondary | ICD-10-CM | POA: Insufficient documentation

## 2014-03-13 DIAGNOSIS — E039 Hypothyroidism, unspecified: Secondary | ICD-10-CM | POA: Insufficient documentation

## 2014-03-13 DIAGNOSIS — Z8659 Personal history of other mental and behavioral disorders: Secondary | ICD-10-CM | POA: Insufficient documentation

## 2014-03-13 DIAGNOSIS — Z8739 Personal history of other diseases of the musculoskeletal system and connective tissue: Secondary | ICD-10-CM | POA: Insufficient documentation

## 2014-03-13 DIAGNOSIS — E785 Hyperlipidemia, unspecified: Secondary | ICD-10-CM | POA: Insufficient documentation

## 2014-03-13 DIAGNOSIS — Z79899 Other long term (current) drug therapy: Secondary | ICD-10-CM | POA: Insufficient documentation

## 2014-03-13 LAB — CBC
HCT: 38.7 % (ref 36.0–46.0)
HEMOGLOBIN: 13.4 g/dL (ref 12.0–15.0)
MCH: 31.2 pg (ref 26.0–34.0)
MCHC: 34.6 g/dL (ref 30.0–36.0)
MCV: 90 fL (ref 78.0–100.0)
Platelets: 257 10*3/uL (ref 150–400)
RBC: 4.3 MIL/uL (ref 3.87–5.11)
RDW: 13.3 % (ref 11.5–15.5)
WBC: 12 10*3/uL — ABNORMAL HIGH (ref 4.0–10.5)

## 2014-03-13 LAB — URINALYSIS, ROUTINE W REFLEX MICROSCOPIC
GLUCOSE, UA: NEGATIVE mg/dL
HGB URINE DIPSTICK: NEGATIVE
Ketones, ur: 15 mg/dL — AB
Nitrite: NEGATIVE
PROTEIN: NEGATIVE mg/dL
SPECIFIC GRAVITY, URINE: 1.023 (ref 1.005–1.030)
UROBILINOGEN UA: 0.2 mg/dL (ref 0.0–1.0)
pH: 5 (ref 5.0–8.0)

## 2014-03-13 LAB — URINE MICROSCOPIC-ADD ON

## 2014-03-13 LAB — BASIC METABOLIC PANEL WITH GFR
BUN: 24 mg/dL — ABNORMAL HIGH (ref 6–23)
CO2: 26 meq/L (ref 19–32)
Calcium: 10.3 mg/dL (ref 8.4–10.5)
Chloride: 102 meq/L (ref 96–112)
Creatinine, Ser: 1.12 mg/dL — ABNORMAL HIGH (ref 0.50–1.10)
GFR calc Af Amer: 56 mL/min — ABNORMAL LOW
GFR calc non Af Amer: 48 mL/min — ABNORMAL LOW
Glucose, Bld: 88 mg/dL (ref 70–99)
Potassium: 4.2 meq/L (ref 3.7–5.3)
Sodium: 141 meq/L (ref 137–147)

## 2014-03-13 MED ORDER — SODIUM CHLORIDE 0.9 % IV BOLUS (SEPSIS): 1000.0000 mL | Freq: Once | INTRAVENOUS | Status: DC

## 2014-03-13 NOTE — Discharge Instructions (Signed)
Dehydration, Adult Dehydration is when you lose more fluids from the body than you take in. Vital organs like the kidneys, brain, and heart cannot function without a proper amount of fluids and salt. Any loss of fluids from the body can cause dehydration.  CAUSES   Vomiting.  Diarrhea.  Excessive sweating.  Excessive urine output.  Fever. SYMPTOMS  Mild dehydration  Thirst.  Dry lips.  Slightly dry mouth. Moderate dehydration  Very dry mouth.  Sunken eyes.  Skin does not bounce back quickly when lightly pinched and released.  Dark urine and decreased urine production.  Decreased tear production.  Headache. Severe dehydration  Very dry mouth.  Extreme thirst.  Rapid, weak pulse (more than 100 beats per minute at rest).  Cold hands and feet.  Not able to sweat in spite of heat and temperature.  Rapid breathing.  Blue lips.  Confusion and lethargy.  Difficulty being awakened.  Minimal urine production.  No tears. DIAGNOSIS  Your caregiver will diagnose dehydration based on your symptoms and your exam. Blood and urine tests will help confirm the diagnosis. The diagnostic evaluation should also identify the cause of dehydration. TREATMENT  Treatment of mild or moderate dehydration can often be done at home by increasing the amount of fluids that you drink. It is best to drink small amounts of fluid more often. Drinking too much at one time can make vomiting worse. Refer to the home care instructions below. Severe dehydration needs to be treated at the hospital where you will probably be given intravenous (IV) fluids that contain water and electrolytes. HOME CARE INSTRUCTIONS   Ask your caregiver about specific rehydration instructions.  Drink enough fluids to keep your urine clear or pale yellow.  Drink small amounts frequently if you have nausea and vomiting.  Eat as you normally do.  Avoid:  Foods or drinks high in sugar.  Carbonated  drinks.  Juice.  Extremely hot or cold fluids.  Drinks with caffeine.  Fatty, greasy foods.  Alcohol.  Tobacco.  Overeating.  Gelatin desserts.  Wash your hands well to avoid spreading bacteria and viruses.  Only take over-the-counter or prescription medicines for pain, discomfort, or fever as directed by your caregiver.  Ask your caregiver if you should continue all prescribed and over-the-counter medicines.  Keep all follow-up appointments with your caregiver. SEEK MEDICAL CARE IF:  You have abdominal pain and it increases or stays in one area (localizes).  You have a rash, stiff neck, or severe headache.  You are irritable, sleepy, or difficult to awaken.  You are weak, dizzy, or extremely thirsty. SEEK IMMEDIATE MEDICAL CARE IF:   You are unable to keep fluids down or you get worse despite treatment.  You have frequent episodes of vomiting or diarrhea.  You have blood or green matter (bile) in your vomit.  You have blood in your stool or your stool looks black and tarry.  You have not urinated in 6 to 8 hours, or you have only urinated a small amount of very dark urine.  You have a fever.  You faint. MAKE SURE YOU:   Understand these instructions.  Will watch your condition.  Will get help right away if you are not doing well or get worse. Document Released: 09/21/2005 Document Revised: 12/14/2011 Document Reviewed: 05/11/2011 [employer] Patient Information 2014 [employer].  

## 2014-03-13 NOTE — ED Provider Notes (Signed)
CSN: [id number]     Arrival date & time 03/13/14  1347 History   First MD Initiated Contact with Patient 03/13/14 1503     Chief Complaint  Patient presents with  . Near Syncope     (Consider location/radiation/quality/duration/timing/severity/associated sxs/prior Treatment) HPI 73 year old female presents about 1-2 hours after having near-syncope. She was playing 9 holes of golf (walking while pulling bag behind her). On 6th hole she started to feel overall weak and tired. Had not drank any water so tried this and finished the round. Was more weak and feeling lightheaded at the end of the round. Went inside and people told her to lay flat, put cold compresses on head and gave her fluids and fruit. She feels improved and no longer feels like she's going to pass out. MIldly weak now but is significantly improved. Temperature outside has been ~85 today. No chest pain, dyspnea, headaches or focal weakness. No urinary symptoms.   Past Medical History  Diagnosis Date  . Acute perichondritis of pinna   . Unspecified hypothyroidism   . Mixed hyperlipidemia   . Anxiety state, unspecified   . Allergic rhinitis due to pollen   . Unspecified dermatitis due to sun   . Disorder of bone and cartilage, unspecified   . Encounter for long-term (current) use of other medications    Past Surgical History  Procedure Laterality Date  . Tonsillectomy  1949    removed   Family History  Problem Relation Age of Onset  . Stroke Mother   . Heart disease Father   . Hyperlipidemia Sister   . Heart disease Sister   . Thyroid disease Sister   . Heart disease Son   . Thyroid disease Son   . Thyroid disease Daughter    History  Substance Use Topics  . Smoking status: Never Smoker   . Smokeless tobacco: Never Used  . Alcohol Use: 1.2 oz/week    2 Glasses of wine per week   OB History   Grav Para Term Preterm Abortions TAB SAB Ect Mult Living                 Review of Systems  Constitutional:  Positive for fatigue. Negative for fever.  Respiratory: Negative for shortness of breath.   Cardiovascular: Negative for chest pain.  Gastrointestinal: Positive for nausea (during the episode, none now). Negative for vomiting and abdominal pain.  Genitourinary: Negative for dysuria.  Neurological: Positive for weakness and light-headedness.  All other systems reviewed and are negative.     Allergies  Seasonal ic  Home Medications   Prior to Admission medications   Medication Sig Start Date End Date Taking? Authorizing Provider  atorvastatin (LIPITOR) 10 MG tablet Take 1 tablet (10 mg total) by mouth daily. For cholesterol 04/27/13  Yes Tiffany L Reed, DO  CALCIUM PO Take 1 tablet by mouth daily.   Yes Historical Provider, MD  cholecalciferol (VITAMIN D) 1000 UNITS tablet Take 1,000 Units by mouth daily.   Yes Historical Provider, MD  fish oil-omega-3 fatty acids 1000 MG capsule Take 2 g by mouth daily. Take one tablet once a day.   Yes Historical Provider, MD  levothyroxine (SYNTHROID, LEVOTHROID) 25 MCG tablet Take 25 mcg by mouth daily before breakfast.   Yes Historical Provider, MD  Multiple Vitamin (MULTIVITAMIN) tablet Take 1 tablet by mouth daily.    Yes Historical Provider, MD  nystatin cream (MYCOSTATIN) Apply 1 application topically 2 (two) times daily. To area beneath breasts  as needed for rash 11/23/13  Yes Tiffany L Reed, DO   BP 122/59  Pulse 64  Temp(Src) 97.7 F (36.5 C) (Oral)  Resp 15  Ht 5' 5.5" (1.664 m)  Wt 175 lb (79.379 kg)  BMI 28.67 kg/m2  SpO2 98% Physical Exam  Nursing note and vitals reviewed. Constitutional: She is oriented to person, place, and time. She appears well-developed and well-nourished. No distress.  HENT:  Head: Normocephalic and atraumatic.  Right Ear: External ear normal.  Left Ear: External ear normal.  Nose: Nose normal.  Mildly dry mucous membranes  Eyes: EOM are normal. Pupils are equal, round, and reactive to light. Right eye  exhibits no discharge. Left eye exhibits no discharge.  Cardiovascular: Normal rate, regular rhythm and normal heart sounds.   No murmur heard. Pulmonary/Chest: Effort normal and breath sounds normal.  Abdominal: Soft. There is no tenderness.  Neurological: She is alert and oriented to person, place, and time.  CN 2-12 grossly intact. 5/5 strength in all 4 extremities. Normal gait  Skin: Skin is warm and dry.    ED Course  Procedures (including critical care time) Labs Review Labs Reviewed  CBC - Abnormal; Notable for the following:    WBC 12.0 (*)    All other components within normal limits  BASIC METABOLIC PANEL - Abnormal; Notable for the following:    BUN 24 (*)    Creatinine, Ser 1.12 (*)    GFR calc non Af Amer 48 (*)    GFR calc Af Amer 56 (*)    All other components within normal limits  URINALYSIS, ROUTINE W REFLEX MICROSCOPIC - Abnormal; Notable for the following:    Color, Urine AMBER (*)    APPearance CLOUDY (*)    Bilirubin Urine SMALL (*)    Ketones, ur 15 (*)    Leukocytes, UA TRACE (*)    All other components within normal limits  URINE MICROSCOPIC-ADD ON - Abnormal; Notable for the following:    Squamous Epithelial / LPF FEW (*)    Casts GRANULAR CAST (*)    All other components within normal limits    Imaging Review No results found.   EKG Interpretation   Date/Time:  Tuesday March 13 2014 13:53:49 EDT Ventricular Rate:  69 PR Interval:  173 QRS Duration: 72 QT Interval:  383 QTC Calculation: 410 R Axis:   48 Text Interpretation:  Sinus rhythm `no acute st/t changes No previous  tracing Confirmed by Ashok Cordia  MD, Lennette Bihari ([id number]) on 03/13/2014 2:08:03 PM      MDM   Final diagnoses:  Near syncope  Dehydration    Patient's symptoms are consistent with near-syncope due to dehydration and heat exposure. The patient feels better after eating and drinking in the ER. I believe that at this time she can orally rehydrate. She has a mild bump in her  creatinine from 0.9-1.2, and I will encourage her to increase her fluid intake at home. No other concerning findings on her workup. Is not consistent with a cardiac or neurologic cause of near-syncope. Patient was advised to return precautions and will follow up with her PCP.    Ephraim Hamburger, MD 03/13/14 [phone]

## 2014-03-13 NOTE — ED Notes (Signed)
Pt to department via EMS- pt reports that she played 9 holes of golf and then started feeling dizzy and light headed. States that she felt like she was going to pass out. Bp was in the 100's but improved. Denies any pain at this time. HR-80 29g LAC.

## 2014-03-13 NOTE — ED Notes (Signed)
Pt given iced water and lunch bag

## 2014-03-13 NOTE — ED Notes (Signed)
Pt comfortable with discharge and follow up instructions. No prescriptions. 

## 2014-04-05 ENCOUNTER — Telehealth: Payer: Self-pay

## 2014-04-05 DIAGNOSIS — E559 Vitamin D deficiency, unspecified: Secondary | ICD-10-CM

## 2014-04-05 DIAGNOSIS — E785 Hyperlipidemia, unspecified: Secondary | ICD-10-CM

## 2014-04-05 DIAGNOSIS — T887XXA Unspecified adverse effect of drug or medicament, initial encounter: Secondary | ICD-10-CM

## 2014-04-05 DIAGNOSIS — E039 Hypothyroidism, unspecified: Secondary | ICD-10-CM

## 2014-04-05 NOTE — Telephone Encounter (Signed)
Please schedule her a lab appointment.

## 2014-04-05 NOTE — Telephone Encounter (Signed)
Patient called triage line indicating she has a pending appointment for a annual exam in August and does not have an appointment for labs. Patient questions if she should have labs prior to appointment? Patient states she was in the ER early June for dehydration and had some labs. Patient would like for Dr.Reed to take that into consideration when ordering labs for annual.  I have pended some orders, Dr.Reed please advise if any additional labs to be drawn?

## 2014-04-05 NOTE — Telephone Encounter (Signed)
Spoke with patient's husband. Scheduled appointment for Wednesday 05/16/14

## 2014-04-18 ENCOUNTER — Other Ambulatory Visit: Payer: Self-pay | Admitting: Internal Medicine

## 2014-04-25 ENCOUNTER — Telehealth: Payer: Self-pay | Admitting: *Deleted

## 2014-04-25 NOTE — Telephone Encounter (Signed)
Patient called and stated that she had an episode of a panic attack this morning. She has stopped taking Celexa. She use to be on Ativan. States that she needs to go back on it. She took some of her daughters this morning and it calmed her down. Patient is leaving to go out of town next Tuesday. Please Advise.

## 2014-04-26 MED ORDER — LORAZEPAM 0.5 MG PO TABS: ORAL_TABLET | ORAL | Status: DC

## 2014-04-26 NOTE — Telephone Encounter (Signed)
Per Dr. Herbert Pun 0.5mg  by mouth daily as needed for panic attacks.  Patient Notified and phoned in Rx into pharmacy.

## 2014-04-26 NOTE — Telephone Encounter (Signed)
Let's put her back on what she was taking in misys.  It appears she has not been on it since we've been on epic.  Just give a 30 day supply.

## 2014-04-26 NOTE — Telephone Encounter (Signed)
Printed Patient's Misy's medication list and gave to Dr. Mariea Clonts to review.

## 2014-05-12 ENCOUNTER — Other Ambulatory Visit: Payer: Self-pay | Admitting: Internal Medicine

## 2014-05-16 ENCOUNTER — Other Ambulatory Visit: Payer: Medicare Other

## 2014-05-16 DIAGNOSIS — E039 Hypothyroidism, unspecified: Secondary | ICD-10-CM

## 2014-05-16 DIAGNOSIS — E559 Vitamin D deficiency, unspecified: Secondary | ICD-10-CM

## 2014-05-16 DIAGNOSIS — E785 Hyperlipidemia, unspecified: Secondary | ICD-10-CM

## 2014-05-16 DIAGNOSIS — T887XXA Unspecified adverse effect of drug or medicament, initial encounter: Secondary | ICD-10-CM

## 2014-05-17 LAB — CBC WITH DIFFERENTIAL/PLATELET
Basophils Absolute: 0 10*3/uL (ref 0.0–0.2)
Basos: 1 %
Eos: 5 %
Eosinophils Absolute: 0.3 10*3/uL (ref 0.0–0.4)
HCT: 41.1 % (ref 34.0–46.6)
Hemoglobin: 13.8 g/dL (ref 11.1–15.9)
Immature Grans (Abs): 0 10*3/uL (ref 0.0–0.1)
Immature Granulocytes: 0 %
Lymphocytes Absolute: 1.9 10*3/uL (ref 0.7–3.1)
Lymphs: 34 %
MCH: 31.1 pg (ref 26.6–33.0)
MCHC: 33.6 g/dL (ref 31.5–35.7)
MCV: 93 fL (ref 79–97)
Monocytes Absolute: 0.5 10*3/uL (ref 0.1–0.9)
Monocytes: 9 %
Neutrophils Absolute: 2.8 10*3/uL (ref 1.4–7.0)
Neutrophils Relative %: 51 %
RBC: 4.44 x10E6/uL (ref 3.77–5.28)
RDW: 14.2 % (ref 12.3–15.4)
WBC: 5.6 10*3/uL (ref 3.4–10.8)

## 2014-05-17 LAB — COMPREHENSIVE METABOLIC PANEL
ALT: 17 IU/L (ref 0–32)
AST: 19 IU/L (ref 0–40)
Albumin/Globulin Ratio: 2.1 (ref 1.1–2.5)
Albumin: 4.2 g/dL (ref 3.5–4.8)
Alkaline Phosphatase: 56 IU/L (ref 39–117)
BUN/Creatinine Ratio: 19 (ref 11–26)
BUN: 18 mg/dL (ref 8–27)
CO2: 24 mmol/L (ref 18–29)
Calcium: 9.3 mg/dL (ref 8.7–10.3)
Chloride: 99 mmol/L (ref 97–108)
Creatinine, Ser: 0.95 mg/dL (ref 0.57–1.00)
GFR calc Af Amer: 70 mL/min/{1.73_m2} (ref >59)
GFR calc non Af Amer: 60 mL/min/{1.73_m2} (ref >59)
Globulin, Total: 2 g/dL (ref 1.5–4.5)
Glucose: 86 mg/dL (ref 65–99)
Potassium: 4.3 mmol/L (ref 3.5–5.2)
Sodium: 139 mmol/L (ref 134–144)
Total Bilirubin: 0.7 mg/dL (ref 0.0–1.2)
Total Protein: 6.2 g/dL (ref 6.0–8.5)

## 2014-05-17 LAB — LIPID PANEL
Chol/HDL Ratio: 2.4 ratio units (ref 0.0–4.4)
Cholesterol, Total: 170 mg/dL (ref 100–199)
HDL: 70 mg/dL (ref >39)
LDL Calculated: 76 mg/dL (ref 0–99)
Triglycerides: 119 mg/dL (ref 0–149)
VLDL Cholesterol Cal: 24 mg/dL (ref 5–40)

## 2014-05-17 LAB — TSH: TSH: 2.98 u[IU]/mL (ref 0.450–4.500)

## 2014-05-17 LAB — VITAMIN D 25 HYDROXY (VIT D DEFICIENCY, FRACTURES): Vit D, 25-Hydroxy: 37.7 ng/mL (ref 30.0–100.0)

## 2014-05-18 ENCOUNTER — Encounter: Payer: Medicare Other | Admitting: Internal Medicine

## 2014-05-18 ENCOUNTER — Other Ambulatory Visit: Payer: Self-pay | Admitting: Internal Medicine

## 2014-05-21 ENCOUNTER — Encounter: Payer: Self-pay | Admitting: Internal Medicine

## 2014-05-21 ENCOUNTER — Ambulatory Visit (INDEPENDENT_AMBULATORY_CARE_PROVIDER_SITE_OTHER): Payer: Medicare Other | Admitting: Internal Medicine

## 2014-05-21 VITALS — BP 126/68 | HR 85 | Temp 97.9°F | Ht 64.5 in | Wt 181.0 lb

## 2014-05-21 DIAGNOSIS — E039 Hypothyroidism, unspecified: Secondary | ICD-10-CM

## 2014-05-21 DIAGNOSIS — R55 Syncope and collapse: Secondary | ICD-10-CM

## 2014-05-21 DIAGNOSIS — Z9289 Personal history of other medical treatment: Secondary | ICD-10-CM

## 2014-05-21 DIAGNOSIS — E559 Vitamin D deficiency, unspecified: Secondary | ICD-10-CM

## 2014-05-21 DIAGNOSIS — J309 Allergic rhinitis, unspecified: Secondary | ICD-10-CM

## 2014-05-21 DIAGNOSIS — Z1211 Encounter for screening for malignant neoplasm of colon: Secondary | ICD-10-CM

## 2014-05-21 DIAGNOSIS — Z9189 Other specified personal risk factors, not elsewhere classified: Secondary | ICD-10-CM

## 2014-05-21 DIAGNOSIS — R739 Hyperglycemia, unspecified: Secondary | ICD-10-CM

## 2014-05-21 DIAGNOSIS — E785 Hyperlipidemia, unspecified: Secondary | ICD-10-CM

## 2014-05-21 DIAGNOSIS — R7309 Other abnormal glucose: Secondary | ICD-10-CM

## 2014-05-21 MED ORDER — LEVOTHYROXINE SODIUM 25 MCG PO TABS: ORAL_TABLET | ORAL | Status: DC

## 2014-05-21 MED ORDER — FLUTICASONE PROPIONATE 50 MCG/ACT NA SUSP: 1.0000 | Freq: Every day | NASAL | Status: DC

## 2014-05-21 MED ORDER — ATORVASTATIN CALCIUM 10 MG PO TABS: ORAL_TABLET | ORAL | Status: DC

## 2014-05-21 NOTE — Progress Notes (Signed)
Patient ID: Patricia Cantu, female   DOB: [birth year], 73 y.o.   MRN: [id number]   Location:  [facility]  Code Status: has 5 wishes scanned into her chart from 11/28/13  Allergies  Allergen Reactions  . Seasonal Ic [Cholestatin]     Chief Complaint  Patient presents with  . Annual Exam    physical & discuss labs (printed), due for colonoscopy(2005 last one)  . other    still having feeling faint/awful when walking during the heat (what's going on ?)    HPI: Patient is a 73 y.o.  seen in the office today for annual exam.    She is due for her colonoscopy.  Routine screening.  Her main concern is a second episode of presyncope when out golfing.  First episode sent her to ED.  Has played gold many times w/o problems.  Both episodes, she was walking.  First time, was getting hot.  Could not pick up golf clubs to put them in the car.  Was sweating profusely.  Went to hospital.  EKG was normal at the time.  Tuesday was second episode, it wasn't even 80 degrees--decided to walk again and drinking lots of water and some gatorade.  Played great, got off golf course, sweated profusely, was told she looked awful, had some feeling of darkness.  Grabbed lunch, went home and laid down.  No palpitations, does not feel like panic attack, but feels out of energy.  Did not wait an unusual time between breakfast and lunch when golfing.  Out 2-2.5 hrs (9 holes).  Last hba1c was 5.8 in February.    Week of 7/20, she and her husband had a bug--headache and diarrhea.  Got worn out.  Had a panic attack, lorazepam helped 1x.  No longer needed after that.  Had also stopped her celexa.  Drove to [facility] and back.  Had another bout of diarrhea 8/3.  Then had second presyncopal episode.  Had diarrhea again on 8/14 and this morning.  Has been eating more fruit.  Had a brother-in-law suddenly die after choking on a piece of candy.  Her sister found him in the garage, was devastated and she  didn't handle it well either.   Has gained weight and not eating right things with stress with her brother in [place] death.    Asks about [facility] diets.  Also uses my [employer].    Goes to [facility] for mammograms.  Wants to switch to a local place.  Agrees to switch to [employer], [facility].     Review of Systems:  Review of Systems  Constitutional: Negative for fever, chills, weight loss, malaise/fatigue and diaphoresis.  HENT: Negative for congestion and hearing loss.   Eyes: Negative for blurred vision.       New glasses  Respiratory: Negative for cough and shortness of breath.   Cardiovascular: Negative for chest pain, palpitations and leg swelling.  Gastrointestinal: Negative for heartburn, diarrhea, constipation, blood in stool and melena.  Genitourinary: Negative for dysuria, urgency and frequency.  Musculoskeletal: Negative for falls and myalgias.  Skin: Negative for rash.  Neurological: Positive for dizziness. Negative for sensory change, loss of consciousness, weakness and headaches.       Presyncope  Endo/Heme/Allergies: Does not bruise/bleed easily.  Psychiatric/Behavioral: Negative for depression and memory loss.    Past Medical History  Diagnosis Date  . Acute perichondritis of pinna   . Unspecified hypothyroidism   . Mixed hyperlipidemia   .  Anxiety state, unspecified   . Allergic rhinitis due to pollen   . Unspecified dermatitis due to sun   . Disorder of bone and cartilage, unspecified   . Encounter for long-term (current) use of other medications     Past Surgical History  Procedure Laterality Date  . Tonsillectomy  1949    removed    Social History:   reports that she has never smoked. She has never used smokeless tobacco. She reports that she drinks about 1.2 ounces of alcohol per week. She reports that she does not use illicit drugs.  Family History  Problem Relation Age of Onset  . Stroke Mother   . Heart disease  Father   . Hyperlipidemia Sister   . Heart disease Sister   . Thyroid disease Sister   . Heart disease Son   . Thyroid disease Son   . Thyroid disease Daughter     Medications: Patient's Medications  New Prescriptions   No medications on file  Previous Medications   CALCIUM PO    Take 1 tablet by mouth daily.   CHOLECALCIFEROL (VITAMIN D) 1000 UNITS TABLET    1,000 Units. Take 2 tablets daily   FISH OIL-OMEGA-3 FATTY ACIDS 1000 MG CAPSULE    Take 2 g by mouth daily. Take one tablet once a day.   LORAZEPAM (ATIVAN) 0.5 MG TABLET    Take one tablet by mouth once daily as needed for panic attacks   MULTIPLE VITAMIN (MULTIVITAMIN) TABLET    Take 1 tablet by mouth daily.    NYSTATIN CREAM (MYCOSTATIN)    Apply 1 application topically 2 (two) times daily. To area beneath breasts as needed for rash  Modified Medications   Modified Medication Previous Medication   ATORVASTATIN (LIPITOR) 10 MG TABLET atorvastatin (LIPITOR) 10 MG tablet      TAKE 1 TABLET BY MOUTH DAILY FOR CHOLESTEROL    TAKE 1 TABLET BY MOUTH DAILY FOR CHOLESTEROL   FLUTICASONE (FLONASE) 50 MCG/ACT NASAL SPRAY fluticasone (FLONASE) 50 MCG/ACT nasal spray      Place 1 spray into both nostrils daily.    Place into both nostrils daily.   LEVOTHYROXINE (SYNTHROID, LEVOTHROID) 25 MCG TABLET levothyroxine (SYNTHROID, LEVOTHROID) 25 MCG tablet      TAKE 1 AND 1/2 TABLETS BY MOUTH ONCE DAILY FOR THYROID.    TAKE 1 AND 1/2 TABLETS BY MOUTH ONCE DAILY FOR THYROID.  Discontinued Medications   LEVOTHYROXINE (SYNTHROID, LEVOTHROID) 25 MCG TABLET    Take 25 mcg by mouth daily before breakfast.     Physical Exam: Filed Vitals:   05/21/14 1349  BP: 126/68  Pulse: 85  Temp: 97.9 F (36.6 C)  TempSrc: Oral  Height: 5' 4.5" (1.638 m)  Weight: 181 lb (82.101 kg)  SpO2: 97%  Physical Exam  Constitutional: She is oriented to person, place, and time. She appears well-developed and well-nourished. No distress.  HENT:  Head:  Normocephalic and atraumatic.  Right Ear: External ear normal.  Left Ear: External ear normal.  Nose: Nose normal.  Mouth/Throat: Oropharynx is clear and moist. No oropharyngeal exudate.  Eyes: Conjunctivae and EOM are normal. Pupils are equal, round, and reactive to light.  Neck: Normal range of motion. Neck supple. No JVD present. No tracheal deviation present. No thyromegaly present.  Cardiovascular: Normal rate, regular rhythm, normal heart sounds and intact distal pulses.   Pulmonary/Chest: Effort normal and breath sounds normal. No respiratory distress. She exhibits no mass. Right breast exhibits no inverted nipple, no mass,  no nipple discharge, no skin change and no tenderness. Left breast exhibits no inverted nipple, no mass, no nipple discharge, no skin change and no tenderness.  Abdominal: Soft. Bowel sounds are normal. She exhibits no distension and no mass. There is no tenderness.  Musculoskeletal: Normal range of motion. She exhibits no edema and no tenderness.  Lymphadenopathy:    She has no cervical adenopathy.  Neurological: She is alert and oriented to person, place, and time. She has normal reflexes. No cranial nerve deficit.  Skin: Skin is warm and dry.  Psychiatric: She has a normal mood and affect. Her behavior is normal. Judgment and thought content normal.     Labs reviewed: Basic Metabolic Panel:  Recent Labs  11/23/13 0820 03/13/14 1532 05/16/14 0829  NA 138 141 139  K 4.4 4.2 4.3  CL 97 102 99  CO2 26 26 24   GLUCOSE 93 88 86  BUN 14 24* 18  CREATININE 0.96 1.12* 0.95  CALCIUM 9.1 10.3 9.3  TSH 2.120  --  2.980   Liver Function Tests:  Recent Labs  11/23/13 0820 05/16/14 0829  AST 14 19  ALT 10 17  ALKPHOS 62 56  BILITOT 0.4 0.7  PROT 6.2 6.2  CBC:  Recent Labs  11/23/13 0820 03/13/14 1532 05/16/14 0829  WBC 6.1 12.0* 5.6  NEUTROABS 3.7  --  2.8  HGB 13.6 13.4 13.8  HCT 39.8 38.7 41.1  MCV 91 90.0 93  PLT  --  257  --    Lipid  Panel:  Recent Labs  11/23/13 0820 05/16/14 0829  HDL 62 70  LDLCALC 68 76  TRIG 184* 119  CHOLHDL 2.7 2.4   Lab Results  Component Value Date   HGBA1C 5.9* 05/21/2014   Assessment/Plan 1. Near syncope - went to ED and workup was unremarkable there with normal EKG, labs -seems to me that she needed to eat something midway through the morning with her hyperglycemia on labs -f/u those labs before next visit: - Hemoglobin A1c; Future - CBC With differential/Platelet; Future - TSH; Future  2. Unspecified hypothyroidism -clinically euthyroid, cont current med - levothyroxine (SYNTHROID, LEVOTHROID) 25 MCG tablet; TAKE 1 AND 1/2 TABLETS BY MOUTH ONCE DAILY FOR THYROID.  Dispense: 135 tablet; Refill: 3 - TSH; Future  3. Other and unspecified hyperlipidemia - was at goal with lipitor, has been gaining weight--eating poorly  - atorvastatin (LIPITOR) 10 MG tablet; TAKE 1 TABLET BY MOUTH DAILY FOR CHOLESTEROL  Dispense: 90 tablet; Refill: 3 - Lipid panel; Future  4. Unspecified vitamin D deficiency -cont calcium and vitamin D supplements  5. Colon cancer screening - is due for her regular screening colonoscopy - Ambulatory referral to Gastroenterology  6. History of mammogram - also due for her mammogram - MM DIGITAL SCREENING BILATERAL; Future  7. Allergic rhinitis, unspecified allergic rhinitis type - continue nasal spray  - fluticasone (FLONASE) 50 MCG/ACT nasal spray; Place 1 spray into both nostrils daily.  Dispense: 16 g; Refill: 3  8. Hyperglycemia -agrees to work on her diet and exercise regimen to help with her weight and sugar -suspect she had a low glucose at the time of her presyncope, but she does not recall this being tested - Comprehensive metabolic panel; Future - Hemoglobin A1c; Future - Lipid panel; Future  Labs/tests ordered: Orders Placed This Encounter  Procedures  . MM DIGITAL SCREENING BILATERAL    [facility];04/24/2013, [facility] (IMAGES REQUESTED)            No  needs Fd/Dorothy        MEDICARE    Standing Status: Future     Number of Occurrences:      Standing Expiration Date: 07/21/2015    Order Specific Question:  Reason for exam:    Answer:  screening, previously went to [facility] in [place] Specific Question:  Preferred imaging location?    Answer:  [facility]  . Hemoglobin A1c    Standing Status: Future     Number of Occurrences: 1     Standing Expiration Date: 05/22/2015  . CBC With differential/Platelet    Standing Status: Future     Number of Occurrences:      Standing Expiration Date: 05/22/2015  . Comprehensive metabolic panel    Standing Status: Future     Number of Occurrences:      Standing Expiration Date: 05/22/2015  . Hemoglobin A1c    Standing Status: Future     Number of Occurrences:      Standing Expiration Date: 05/22/2015  . Lipid panel    Standing Status: Future     Number of Occurrences:      Standing Expiration Date: 05/22/2015  . TSH    Standing Status: Future     Number of Occurrences:      Standing Expiration Date: 05/22/2015  . Ambulatory referral to Gastroenterology    Referral Priority:  Routine    Referral Type:  Consultation    Referral Reason:  Specialty Services Required    Requested Specialty:  Gastroenterology    Number of Visits Requested:  1    Next appt:  6 mos with labs before

## 2014-05-21 NOTE — Patient Instructions (Signed)
Hydrate adequately (at least 6-8 8oz glasses of water per day) Take a small snack along when you go golfing like a banana or some watermelon

## 2014-05-22 LAB — HEMOGLOBIN A1C
Est. average glucose Bld gHb Est-mCnc: 123 mg/dL
Hgb A1c MFr Bld: 5.9 % — ABNORMAL HIGH (ref 4.8–5.6)

## 2014-05-25 ENCOUNTER — Encounter: Payer: Self-pay | Admitting: Internal Medicine

## 2014-06-13 ENCOUNTER — Ambulatory Visit
Admission: RE | Admit: 2014-06-13 | Discharge: 2014-06-13 | Disposition: A | Discharge status: Home / Self Care | Payer: Medicare Other | Source: Ambulatory Visit | Attending: Internal Medicine | Admitting: Internal Medicine

## 2014-06-13 DIAGNOSIS — Z9289 Personal history of other medical treatment: Secondary | ICD-10-CM

## 2014-07-11 ENCOUNTER — Ambulatory Visit (AMBULATORY_SURGERY_CENTER): Payer: Self-pay

## 2014-07-11 VITALS — Ht 64.75 in | Wt 184.4 lb

## 2014-07-11 DIAGNOSIS — Z1211 Encounter for screening for malignant neoplasm of colon: Secondary | ICD-10-CM

## 2014-07-11 MED ORDER — MOVIPREP 100 G PO SOLR: 1.0000 | Freq: Once | ORAL | Status: DC

## 2014-07-11 NOTE — Progress Notes (Signed)
No allergies to eggs or soy No past problems with anesthesia No home oxygen No diet/weight loss meds  Has email  Emmi instructions given for colonoscopy 

## 2014-07-17 ENCOUNTER — Encounter: Payer: Self-pay | Admitting: Internal Medicine

## 2014-07-25 ENCOUNTER — Ambulatory Visit (AMBULATORY_SURGERY_CENTER): Payer: Medicare Other | Admitting: Internal Medicine

## 2014-07-25 ENCOUNTER — Encounter: Payer: Self-pay | Admitting: Internal Medicine

## 2014-07-25 VITALS — BP 113/54 | HR 67 | Temp 97.6°F | Resp 18 | Ht 64.75 in | Wt 184.0 lb

## 2014-07-25 DIAGNOSIS — D124 Benign neoplasm of descending colon: Secondary | ICD-10-CM

## 2014-07-25 DIAGNOSIS — Z1211 Encounter for screening for malignant neoplasm of colon: Secondary | ICD-10-CM

## 2014-07-25 DIAGNOSIS — D123 Benign neoplasm of transverse colon: Secondary | ICD-10-CM

## 2014-07-25 DIAGNOSIS — K635 Polyp of colon: Secondary | ICD-10-CM

## 2014-07-25 MED ORDER — SODIUM CHLORIDE 0.9 % IV SOLN: 500.0000 mL | INTRAVENOUS | Status: DC

## 2014-07-25 NOTE — Op Note (Signed)
[facility]  [address] [place], [id number]   COLONOSCOPY PROCEDURE REPORT  PATIENT: Patricia, Cantu  MR#: [id number] BIRTHDATE: 10-15-1940 , 73  yrs. old GENDER: female ENDOSCOPIST: Jerene Bears, MD REFERRED DX:[id number] Reed, M.D. PROCEDURE DATE:  07/25/2014 PROCEDURE:   Colonoscopy with snare polypectomy First Screening Colonoscopy - Avg.  risk and is 50 yrs.  old or older - No.  Prior Negative Screening - Now for repeat screening. 10 or more years since last screening  History of Adenoma - Now for follow-up colonoscopy & has been > or = to 3 yrs.  N/A  Polyps Removed Today? Yes. ASA CLASS:   Class II INDICATIONS:average risk for colorectal cancer and last colonoscopy completed  10 years ago. MEDICATIONS: Propofol 350 mg IV and Monitored anesthesia care, lidocaine 40 mg IV  DESCRIPTION OF PROCEDURE:   After the risks benefits and alternatives of the procedure were thoroughly explained, informed consent was obtained.  The digital rectal exam revealed no rectal mass.   The LB [id number]  endoscope was introduced through the anus and advanced to the cecum, which was identified by both the appendix and ileocecal valve. No adverse events experienced. The quality of the prep was good, using MoviPrep  The instrument was then slowly withdrawn as the colon was fully examined.  COLON FINDINGS: A sessile polyp measuring 3 mm in size was found in the transverse colon.  A polypectomy was performed with a cold snare.   A flat polyp measuring 6 mm in size with a mucous cap was found in the descending colon.  A polypectomy was performed using snare cautery.   The examination was otherwise normal. Retroflexion was not performed due to a narrow rectal vault. The time to cecum=3 minutes 06 seconds.  Withdrawal time=12 minutes 26 seconds.  The scope was withdrawn and the procedure completed. COMPLICATIONS: There were no immediate complications.  ENDOSCOPIC IMPRESSION: 1.    Sessile polyp was found in the transverse colon; polypectomy was performed with a cold snare 2.   Flat polyp was found in the descending colon; polypectomy was performed using snare cautery 3.   The examination was otherwise normal  RECOMMENDATIONS: 1.  Avoid all NSAIDS for the next 2 weeks. 2.  Await pathology results 3.  If the polyps removed today are proven to be adenomatous (pre-cancerous) polyps, you will need a repeat colonoscopy in 5 years.  Otherwise you should continue to follow colorectal cancer screening guidelines for "routine risk" patients with colonoscopy in 10 years.  You will receive a letter within 1-2 weeks with the results of your biopsy as well as final recommendations.  Please call my office if you have not received a letter after 3 weeks.  eSigned:  Jerene Bears, MD 07/25/2014 11:53 AM  cc:  The Patient, [name]

## 2014-07-25 NOTE — Patient Instructions (Signed)
YOU HAD AN ENDOSCOPIC PROCEDURE TODAY AT [facility]: Refer to the procedure report that was given to you for any specific questions about what was found during the examination.  If the procedure report does not answer your questions, please call your gastroenterologist to clarify.  If you requested that your care partner not be given the details of your procedure findings, then the procedure report has been included in a sealed envelope for you to review at your convenience later.  YOU SHOULD EXPECT: Some feelings of bloating in the abdomen. Passage of more gas than usual.  Walking can help get rid of the air that was put into your GI tract during the procedure and reduce the bloating. If you had a lower endoscopy (such as a colonoscopy or flexible sigmoidoscopy) you may notice spotting of blood in your stool or on the toilet paper. If you underwent a bowel prep for your procedure, then you may not have a normal bowel movement for a few days.  DIET: Your first meal following the procedure should be a light meal and then it is ok to progress to your normal diet.  A half-sandwich or bowl of soup is an example of a good first meal.  Heavy or fried foods are harder to digest and may make you feel nauseous or bloated.  Likewise meals heavy in dairy and vegetables can cause extra gas to form and this can also increase the bloating.  Drink plenty of fluids but you should avoid alcoholic beverages for 24 hours.  ACTIVITY: Your care partner should take you home directly after the procedure.  You should plan to take it easy, moving slowly for the rest of the day.  You can resume normal activity the day after the procedure however you should NOT DRIVE or use heavy machinery for 24 hours (because of the sedation medicines used during the test).    SYMPTOMS TO REPORT IMMEDIATELY: A gastroenterologist can be reached at any hour.  During normal business hours, 8:30 AM to 5:00 PM Monday through Friday,  call [phone].  After hours and on weekends, please call the GI answering service at [phone] who will take a message and have the physician on call contact you.   Following lower endoscopy (colonoscopy or flexible sigmoidoscopy):  Excessive amounts of blood in the stool  Significant tenderness or worsening of abdominal pains  Swelling of the abdomen that is new, acute  Fever of 100F or higher   If any biopsies were taken you will be contacted by phone or by letter within the next 1-3 weeks.  Call your gastroenterologist if you have not heard about the biopsies in 3 weeks.  Our staff will call the home number listed on your records the next business day following your procedure to check on you and address any questions or concerns that you may have at that time regarding the information given to you following your procedure. This is a courtesy call and so if there is no answer at the home number and we have not heard from you through the emergency physician on call, we will assume that you have returned to your regular daily activities without incident.  SIGNATURES/CONFIDENTIALITY: You and/or your care partner have signed paperwork which will be entered into your electronic medical record.  These signatures attest to the fact that that the information above on your After Visit Summary has been reviewed and is understood.  Full responsibility of the confidentiality of this discharge  information lies with you and/or your care-partner.  Polyp information given.  No NSAIDS for 2 weeks.

## 2014-07-25 NOTE — Progress Notes (Signed)
Called to room to assist during endoscopic procedure.  Patient ID and intended procedure confirmed with present staff. Received instructions for my participation in the procedure from the performing physician.  

## 2014-07-25 NOTE — Progress Notes (Signed)
Stable to RR 

## 2014-07-26 ENCOUNTER — Telehealth: Payer: Self-pay

## 2014-07-26 NOTE — Telephone Encounter (Signed)
  Follow up Call-  Call back number 07/25/2014  Post procedure Call Back phone  # [phone]  Permission to leave phone message Yes     Patient questions:  Do you have a fever, pain , or abdominal swelling? No. Pain Score  0 *  Have you tolerated food without any problems? Yes.    Have you been able to return to your normal activities? Yes.    Do you have any questions about your discharge instructions: Diet   No. Medications  No. Follow up visit  No.  Do you have questions or concerns about your Care? No.  Actions: * If pain score is 4 or above: No action needed, pain <4.  No problems per the pt. maw

## 2014-08-01 ENCOUNTER — Encounter: Payer: Self-pay | Admitting: Internal Medicine

## 2014-08-03 ENCOUNTER — Ambulatory Visit (INDEPENDENT_AMBULATORY_CARE_PROVIDER_SITE_OTHER): Payer: Medicare Other

## 2014-08-03 DIAGNOSIS — Z23 Encounter for immunization: Secondary | ICD-10-CM

## 2014-09-13 ENCOUNTER — Telehealth: Payer: Self-pay | Admitting: *Deleted

## 2014-09-13 NOTE — Telephone Encounter (Signed)
It sounds like she needs an appointment so I can get more history about the diarrhea.  If her nerves are worse, we can restart the celexa and ativan.

## 2014-09-13 NOTE — Telephone Encounter (Signed)
Patient called and stated that she had another "episode" [place] [employer]. EMS checked her out and everything was fine. Patient stated that she has had more diarrhea these past 6 months. Patient wants to know if she should go back on the Celexa and Ativan. Patient does have the Rx's for those but wants to know if you think she should start taking again. Would also like your opinion regarding the diarrhea she has had on and off for the last 6 months. Please Advise.

## 2014-09-17 ENCOUNTER — Ambulatory Visit (INDEPENDENT_AMBULATORY_CARE_PROVIDER_SITE_OTHER): Payer: Medicare Other | Admitting: Internal Medicine

## 2014-09-17 ENCOUNTER — Encounter: Payer: Self-pay | Admitting: Internal Medicine

## 2014-09-17 VITALS — BP 130/80 | HR 80 | Temp 98.1°F | Resp 18 | Ht 64.0 in | Wt 188.4 lb

## 2014-09-17 DIAGNOSIS — F411 Generalized anxiety disorder: Secondary | ICD-10-CM

## 2014-09-17 DIAGNOSIS — K589 Irritable bowel syndrome without diarrhea: Secondary | ICD-10-CM

## 2014-09-17 DIAGNOSIS — F410 Panic disorder [episodic paroxysmal anxiety] without agoraphobia: Secondary | ICD-10-CM

## 2014-09-17 NOTE — Progress Notes (Signed)
Patient ID: Patricia Cantu, female   DOB: 1940/12/20, 73 y.o.   MRN: [id number]   Location:  [facility] / [facility]   No Known Allergies  Chief Complaint  Patient presents with  . Acute Visit    panic or anxiety, diarrhea     HPI: Patient is a 73 y.o. female seen in the office today for return of her panic attacks and anxiety.    Thursday, got up feeling antsy. Had to sit down 1/2way through tai chi.  Felt sense of passing out coming on.  EMTs checked her out and she was ok after eating lunch with her husband.  EKG, CBG and VS were all fine.    1995-2000 took celexa and lorazepam off and on, but stayed on for a while.  Tried to ease herself off 1.5 years ago.  In 01-25-2023 when her brother in law died.  Has granddaughter's wedding tomorrow. Brings up bad memories.  Has also had a lot of diarrhea past few months.  Had it just before the episode last thursday.  Has been unable to make a food correlation.  Wonders if she has diverticuli like her father or a nervous stomach.  Took an ativan 0.56m and 1/2 celexa also.   Also has another trip out of country upcoming.  Actually gets scared at times.  Was a patient of the [facility] for anxiety and panic disorder.    Diarrhea has mostly been in past 3-4 mos.   C scope was fine 07/25/14.    Has had a pinch off and on beneath left breast almost midline.  Feels warm.    Review of Systems:  Review of Systems  Gastrointestinal: Positive for diarrhea.  Psychiatric/Behavioral: Positive for depression. The patient is nervous/anxious.    Past Medical History  Diagnosis Date  . Acute perichondritis of pinna   . Unspecified hypothyroidism   . Mixed hyperlipidemia   . Anxiety state, unspecified   . Allergic rhinitis due to pollen   . Unspecified dermatitis due to sun   . Disorder of bone and cartilage, unspecified   . Encounter for long-term (current) use of other medications     Past Surgical History    Procedure Laterality Date  . Tonsillectomy  1949    removed  . Colonoscopy      x2  . Childbirth      x2 "blocks"    Social History:   reports that she has never smoked. She has never used smokeless tobacco. She reports that she drinks about 1.2 oz of alcohol per week. She reports that she does not use illicit drugs.  Family History  Problem Relation Age of Onset  . Stroke Mother   . Heart disease Father   . Hyperlipidemia Sister   . Heart disease Sister   . Thyroid disease Sister   . Heart disease Son   . Thyroid disease Son   . Thyroid disease Daughter   . Colon cancer Neg Hx     Medications: Patient's Medications  New Prescriptions   No medications on file  Previous Medications   ATORVASTATIN (LIPITOR) 10 MG TABLET    TAKE 1 TABLET BY MOUTH DAILY FOR CHOLESTEROL   CALCIUM PO    Take 1 tablet by mouth daily.   CHOLECALCIFEROL (VITAMIN D) 1000 UNITS TABLET    1,000 Units. Take 2 tablets daily   CITALOPRAM (CELEXA) 20 MG TABLET    Take 10 mg by mouth daily.  FISH OIL-OMEGA-3 FATTY ACIDS 1000 MG CAPSULE    Take 2 g by mouth daily. Take one tablet once a day.   FLUTICASONE (FLONASE) 50 MCG/ACT NASAL SPRAY    Place 1 spray into both nostrils daily.   LEVOTHYROXINE (SYNTHROID, LEVOTHROID) 25 MCG TABLET    TAKE 1 AND 1/2 TABLETS BY MOUTH ONCE DAILY FOR THYROID.   LORAZEPAM (ATIVAN) 0.5 MG TABLET    Take 0.5 mg by mouth every 8 (eight) hours.   MULTIPLE VITAMIN (MULTIVITAMIN) TABLET    Take 1 tablet by mouth daily.    NYSTATIN CREAM (MYCOSTATIN)    Apply 1 application topically 2 (two) times daily. To area beneath breasts as needed for rash  Modified Medications   No medications on file  Discontinued Medications   No medications on file     Physical Exam: Filed Vitals:   09/17/14 1327  BP: 130/80  Pulse: 80  Temp: 98.1 F (36.7 C)  TempSrc: Oral  Resp: 18  Height: 5' 4"  (1.626 m)  Weight: 188 lb 6.4 oz (85.458 kg)  SpO2: 96%  Physical Exam  Constitutional: She  is oriented to person, place, and time. She appears well-developed and well-nourished. No distress.  Cardiovascular: Normal rate, regular rhythm, normal heart sounds and intact distal pulses.   Pulmonary/Chest: Effort normal and breath sounds normal. No respiratory distress.  Abdominal: Soft. Bowel sounds are normal. She exhibits no distension and no mass. There is no tenderness.  Musculoskeletal: Normal range of motion.  Neurological: She is alert and oriented to person, place, and time.  Psychiatric:  Little bit of tremor (anxiety-related)    Labs reviewed: Basic Metabolic Panel:  Recent Labs  11/23/13 0820 03/13/14 1532 05/16/14 0829  NA 138 141 139  K 4.4 4.2 4.3  CL 97 102 99  CO2 26 26 24   GLUCOSE 93 88 86  BUN 14 24* 18  CREATININE 0.96 1.12* 0.95  CALCIUM 9.1 10.3 9.3  TSH 2.120  --  2.980   Liver Function Tests:  Recent Labs  11/23/13 0820 05/16/14 0829  AST 14 19  ALT 10 17  ALKPHOS 62 56  BILITOT 0.4 0.7  PROT 6.2 6.2  CBC:  Recent Labs  11/23/13 0820 03/13/14 1532 05/16/14 0829  WBC 6.1 12.0* 5.6  NEUTROABS 3.7  --  2.8  HGB 13.6 13.4 13.8  HCT 39.8 38.7 41.1  MCV 91 90.0 93  PLT  --  257  --    Lipid Panel:  Recent Labs  11/23/13 0820 05/16/14 0829  HDL 62 70  LDLCALC 68 76  TRIG 184* 119  CHOLHDL 2.7 2.4   Lab Results  Component Value Date   HGBA1C 5.9* 05/21/2014   Assessment/Plan 1. Panic attacks -restart ativan and celexa  2. Generalized anxiety disorder -restart ativan and celexa due to her recurrent panic attacks--seems all of this was brought on by her brother-in-law's death and visiting his empty home more recently, also has some other stressors with a younger relative with dyslexia and OCD  3. Irritable bowel syndrome -seems worsened with increased anxiety, but otherwise does not have symptoms -cscope was normal this year  Labs/tests ordered:  Reschedule for when return from trip Next appt: reschedule when  return  [place]. Tyneshia Stivers, D.O. [facility] [address], [place] [address] Cell Phone (Mon-Fri 8am-5pm):  [phone] On Call:  [phone] & follow prompts after 5pm & weekends Office Phone:  [phone] Office Fax:  [phone]

## 2014-09-17 NOTE — Telephone Encounter (Signed)
Appointment scheduled for today at 1:30 with Dr. Mariea Clonts.

## 2014-10-11 ENCOUNTER — Telehealth: Payer: Self-pay | Admitting: *Deleted

## 2014-10-11 NOTE — Telephone Encounter (Signed)
Patient called and stated the Celexa is not working. Taking Celexa 20mg  1/2 tablet daily for Panic Attack. It is not working. Please Advise. I spoke with Dr. Mariea Clonts and she informed me that patient can go ahead and take ONE tablet by mouth daily. Patient Notified and Agreed.

## 2014-10-15 ENCOUNTER — Other Ambulatory Visit: Payer: Self-pay | Admitting: Nurse Practitioner

## 2014-11-13 ENCOUNTER — Other Ambulatory Visit: Payer: Medicare Other

## 2014-11-15 ENCOUNTER — Ambulatory Visit: Payer: Medicare Other | Admitting: Internal Medicine

## 2014-11-26 ENCOUNTER — Other Ambulatory Visit: Payer: Medicare Other

## 2014-11-26 DIAGNOSIS — R739 Hyperglycemia, unspecified: Secondary | ICD-10-CM

## 2014-11-26 DIAGNOSIS — E785 Hyperlipidemia, unspecified: Secondary | ICD-10-CM

## 2014-11-26 DIAGNOSIS — E039 Hypothyroidism, unspecified: Secondary | ICD-10-CM

## 2014-11-26 DIAGNOSIS — R55 Syncope and collapse: Secondary | ICD-10-CM

## 2014-11-27 LAB — CBC WITH DIFFERENTIAL
Basophils Absolute: 0.1 10*3/uL (ref 0.0–0.2)
Basos: 1 %
Eos: 5 %
Eosinophils Absolute: 0.3 10*3/uL (ref 0.0–0.4)
HCT: 42.5 % (ref 34.0–46.6)
Hemoglobin: 14.6 g/dL (ref 11.1–15.9)
Immature Grans (Abs): 0 10*3/uL (ref 0.0–0.1)
Immature Granulocytes: 0 %
Lymphocytes Absolute: 1.9 10*3/uL (ref 0.7–3.1)
Lymphs: 32 %
MCH: 31.2 pg (ref 26.6–33.0)
MCHC: 34.4 g/dL (ref 31.5–35.7)
MCV: 91 fL (ref 79–97)
Monocytes Absolute: 0.6 10*3/uL (ref 0.1–0.9)
Monocytes: 10 %
Neutrophils Absolute: 3 10*3/uL (ref 1.4–7.0)
Neutrophils Relative %: 52 %
RBC: 4.68 x10E6/uL (ref 3.77–5.28)
RDW: 14 % (ref 12.3–15.4)
WBC: 5.8 10*3/uL (ref 3.4–10.8)

## 2014-11-27 LAB — HEMOGLOBIN A1C
Est. average glucose Bld gHb Est-mCnc: 117 mg/dL
Hgb A1c MFr Bld: 5.7 % — ABNORMAL HIGH (ref 4.8–5.6)

## 2014-11-27 LAB — COMPREHENSIVE METABOLIC PANEL
ALT: 19 IU/L (ref 0–32)
AST: 20 IU/L (ref 0–40)
Albumin/Globulin Ratio: 1.9 (ref 1.1–2.5)
Albumin: 4 g/dL (ref 3.5–4.8)
Alkaline Phosphatase: 50 IU/L (ref 39–117)
BUN/Creatinine Ratio: 14 (ref 11–26)
BUN: 15 mg/dL (ref 8–27)
Bilirubin Total: 0.6 mg/dL (ref 0.0–1.2)
CO2: 24 mmol/L (ref 18–29)
Calcium: 9 mg/dL (ref 8.7–10.3)
Chloride: 99 mmol/L (ref 97–108)
Creatinine, Ser: 1.04 mg/dL — ABNORMAL HIGH (ref 0.57–1.00)
GFR calc Af Amer: 62 mL/min/{1.73_m2} (ref >59)
GFR calc non Af Amer: 54 mL/min/{1.73_m2} — ABNORMAL LOW (ref >59)
Globulin, Total: 2.1 g/dL (ref 1.5–4.5)
Glucose: 86 mg/dL (ref 65–99)
Potassium: 4.4 mmol/L (ref 3.5–5.2)
Sodium: 138 mmol/L (ref 134–144)
Total Protein: 6.1 g/dL (ref 6.0–8.5)

## 2014-11-27 LAB — LIPID PANEL
Chol/HDL Ratio: 2.1 ratio units (ref 0.0–4.4)
Cholesterol, Total: 157 mg/dL (ref 100–199)
HDL: 76 mg/dL (ref >39)
LDL Calculated: 57 mg/dL (ref 0–99)
Triglycerides: 120 mg/dL (ref 0–149)
VLDL Cholesterol Cal: 24 mg/dL (ref 5–40)

## 2014-11-27 LAB — TSH: TSH: 4.65 u[IU]/mL — ABNORMAL HIGH (ref 0.450–4.500)

## 2014-11-29 ENCOUNTER — Ambulatory Visit (INDEPENDENT_AMBULATORY_CARE_PROVIDER_SITE_OTHER): Payer: Medicare Other | Admitting: Internal Medicine

## 2014-11-29 ENCOUNTER — Encounter: Payer: Self-pay | Admitting: Internal Medicine

## 2014-11-29 VITALS — BP 140/70 | HR 80 | Temp 98.1°F | Resp 18 | Ht 64.0 in | Wt 186.8 lb

## 2014-11-29 DIAGNOSIS — Z23 Encounter for immunization: Secondary | ICD-10-CM

## 2014-11-29 DIAGNOSIS — F411 Generalized anxiety disorder: Secondary | ICD-10-CM

## 2014-11-29 DIAGNOSIS — R739 Hyperglycemia, unspecified: Secondary | ICD-10-CM

## 2014-11-29 DIAGNOSIS — E039 Hypothyroidism, unspecified: Secondary | ICD-10-CM

## 2014-11-29 DIAGNOSIS — E785 Hyperlipidemia, unspecified: Secondary | ICD-10-CM

## 2014-11-29 MED ORDER — LEVOTHYROXINE SODIUM 50 MCG PO TABS: 50.0000 ug | ORAL_TABLET | Freq: Every day | ORAL | Status: DC

## 2014-11-29 NOTE — Progress Notes (Signed)
Patient ID: Patricia Cantu, female   DOB: [birth year], 74 y.o.   MRN: [id number]   Location:  [facility]  Code Status: Full Code.  No Known Allergies  Chief Complaint  Patient presents with  . Medical Management of Chronic Issues    HPI: Patient is a 74 y.o. female seen in the office today for followup of chronic medical management.   Noted recent labs showed elevated creatine. Denies urinary urgency, frequency, hematuria, cloudy or dark urine.   TSH was elevated. Patricia Cantu states that Patricia Cantu did not take Patricia Cantu synthroid on the day of testing. Denies missing doses or taking with food. Patricia Cantu had also just returned from a month long cruise to [place] and was readjusting to the time changes. Denies fatigue, malaise, constipation or appetite changes.   Mood is stable now with increase of Celexa. Denies depression, anxiety, suicidal ideations.   Hgb A1c decreased from 5.7 to 5.9. Patricia Cantu took a cruise but was careful not to gain weight. Patricia Cantu continues to exercise and wants to lose weight, but not really a priority right now.   Lipid levels were stable. Tolerating lipitor. No myalgias or joint pains.  C/o itching and dry skin in Patricia Cantu ears. Patricia Cantu has had this before and was given some type of oil for it.   Review of Systems:  Review of Systems  Constitutional: Negative for fever, chills, weight loss and malaise/fatigue.  Respiratory: Negative for cough and hemoptysis.   Cardiovascular: Negative for chest pain and palpitations.  Gastrointestinal: Negative for heartburn, nausea, vomiting, abdominal pain, diarrhea and constipation.  Genitourinary: Negative for dysuria, urgency, frequency and hematuria.  Musculoskeletal: Negative for myalgias, back pain and neck pain.  Neurological: Negative for headaches.    Past Medical History  Diagnosis Date  . Acute perichondritis of pinna   . Unspecified hypothyroidism   . Mixed hyperlipidemia   . Anxiety state,  unspecified   . Allergic rhinitis due to pollen   . Unspecified dermatitis due to sun   . Disorder of bone and cartilage, unspecified   . Encounter for long-term (current) use of other medications     Past Surgical History  Procedure Laterality Date  . Tonsillectomy  1949    removed  . Colonoscopy      x2  . Childbirth      x2 "blocks"    Social History:   reports that Patricia Cantu has never smoked. Patricia Cantu has never used smokeless tobacco. Patricia Cantu reports that Patricia Cantu drinks about 1.2 oz of alcohol per week. Patricia Cantu reports that Patricia Cantu does not use illicit drugs.  Family History  Problem Relation Age of Onset  . Stroke Mother   . Heart disease Father   . Hyperlipidemia Sister   . Heart disease Sister   . Thyroid disease Sister   . Heart disease Son   . Thyroid disease Son   . Thyroid disease Daughter   . Colon cancer Neg Hx     Medications: Patient's Medications  New Prescriptions   No medications on file  Previous Medications   ATORVASTATIN (LIPITOR) 10 MG TABLET    TAKE 1 TABLET BY MOUTH DAILY FOR CHOLESTEROL   CALCIUM PO    Take 1 tablet by mouth daily.   CHOLECALCIFEROL (VITAMIN D) 1000 UNITS TABLET    1,000 Units. Take 2 tablets daily   CITALOPRAM (CELEXA) 20 MG TABLET    TAKE 1 TABLET BY MOUTH ONCE DAILY.   FISH OIL-OMEGA-3 FATTY ACIDS 1000 MG CAPSULE  Take 2 g by mouth daily. Take one tablet once a day.   FLUTICASONE (FLONASE) 50 MCG/ACT NASAL SPRAY    Place 1 spray into both nostrils daily.   LEVOTHYROXINE (SYNTHROID, LEVOTHROID) 25 MCG TABLET    TAKE 1 AND 1/2 TABLETS BY MOUTH ONCE DAILY FOR THYROID.   LORAZEPAM (ATIVAN) 0.5 MG TABLET    Take 0.5 mg by mouth as needed.    MULTIPLE VITAMIN (MULTIVITAMIN) TABLET    Take 1 tablet by mouth daily.    NYSTATIN CREAM (MYCOSTATIN)    Apply 1 application topically 2 (two) times daily. To area beneath breasts as needed for rash  Modified Medications   No medications on file  Discontinued Medications   CITALOPRAM (CELEXA) 20 MG TABLET    Take  10 mg by mouth daily.     Physical Exam: Filed Vitals:   11/29/14 1545  BP: 140/70  Pulse: 80  Temp: 98.1 F (36.7 C)  TempSrc: Oral  Resp: 18  Height: 5\' 4"  (1.626 m)  Weight: 186 lb 12.8 oz (84.732 kg)  SpO2: 98%   Physical Exam  Constitutional: Patricia Cantu is oriented to person, place, and time. Patricia Cantu appears well-developed and well-nourished.  Cardiovascular: Normal rate, regular rhythm, normal heart sounds and intact distal pulses.  Exam reveals no gallop and no friction rub.   No murmur heard. Pulmonary/Chest: Effort normal and breath sounds normal. No respiratory distress. Patricia Cantu has no wheezes. Patricia Cantu has no rales. Patricia Cantu exhibits no tenderness.  Abdominal: Soft. Bowel sounds are normal. Patricia Cantu exhibits no distension. There is no tenderness.  Neurological: Patricia Cantu is alert and oriented to person, place, and time.  Skin: Skin is warm and dry.  Some flaky skin noted in left ear canal  Psychiatric: Patricia Cantu has a normal mood and affect. Patricia Cantu behavior is normal. Judgment and thought content normal.    Labs reviewed: Basic Metabolic Panel:  Recent Labs  03/13/14 1532 05/16/14 0829 11/26/14 0818  NA 141 139 138  K 4.2 4.3 4.4  CL 102 99 99  CO2 26 24 24   GLUCOSE 88 86 86  BUN 24* 18 15  CREATININE 1.12* 0.95 1.04*  CALCIUM 10.3 9.3 9.0  TSH  --  2.980 4.650*   Liver Function Tests:  Recent Labs  05/16/14 0829 11/26/14 0818  AST 19 20  ALT 17 19  ALKPHOS 56 50  BILITOT 0.7 0.6  PROT 6.2 6.1   No results for input(s): LIPASE, AMYLASE in the last 8760 hours. No results for input(s): AMMONIA in the last 8760 hours. CBC:  Recent Labs  03/13/14 1532 05/16/14 0829 11/26/14 0818  WBC 12.0* 5.6 5.8  NEUTROABS  --  2.8 3.0  HGB 13.4 13.8 14.6  HCT 38.7 41.1 42.5  MCV 90.0 93 91  PLT 257  --   --    Lipid Panel:  Recent Labs  05/16/14 0829 11/26/14 0818  HDL 70 76  LDLCALC 76 57  TRIG 119 120  CHOLHDL 2.4 2.1   Lab Results  Component Value Date   HGBA1C 5.7* 11/26/2014      Past Procedures:  Assessment/Plan 1. Hypothyroidism, unspecified hypothyroidism type TSH is elevated. Increase synthroid from 37.5 to 34mcg. Recheck TSH in six weeks.  Patricia Cantu has a 3 month supply of the 61mcg pills and will take two until Patricia Cantu uses them up.  2. Generalized anxiety disorder Improved. Continue current dose of celexa--has only used 2 ativan since I saw Patricia Cantu last.  3. Hyperglycemia Improved. Continue diet and exercise  interventions.  4. Hyperlipidemia with target LDL less than 100 Stable. Continue lipitor and exercise.  5.  Need for prevnar:  Given today  Labs/tests ordered:   Orders Placed This Encounter  Procedures  . Pneumococcal conjugate vaccine 13-valent  . TSH    Standing Status: Future     Number of Occurrences:      Standing Expiration Date: 11/30/2015  . CBC with Differential/Platelet    Standing Status: Future     Number of Occurrences:      Standing Expiration Date: 11/30/2015  . Comprehensive metabolic panel    Standing Status: Future     Number of Occurrences:      Standing Expiration Date: 11/30/2015    Order Specific Question:  Has the patient fasted?    Answer:  Yes  . Hemoglobin A1c    Standing Status: Future     Number of Occurrences:      Standing Expiration Date: 11/30/2015  . Lipid panel    Standing Status: Future     Number of Occurrences:      Standing Expiration Date: 11/30/2015    Order Specific Question:  Has the patient fasted?    Answer:  Yes  . TSH    Standing Status: Future     Number of Occurrences:      Standing Expiration Date: 11/30/2015    Next appt:  6 wks for tsh, then 6 mos for routine visit with labs before  Khyli Swaim L. Ziyon Cedotal, D.O. [facility] [address], [place] [address] Cell Phone (Mon-Fri 8am-5pm):  [phone] On Call:  [phone] & follow prompts after 5pm & weekends Office Phone:  [phone] Office Fax:  [phone]

## 2015-01-09 ENCOUNTER — Other Ambulatory Visit: Payer: Medicare Other

## 2015-01-09 DIAGNOSIS — E039 Hypothyroidism, unspecified: Secondary | ICD-10-CM

## 2015-01-10 LAB — TSH: TSH: 2.66 u[IU]/mL (ref 0.450–4.500)

## 2015-02-12 ENCOUNTER — Other Ambulatory Visit: Payer: Self-pay | Admitting: Internal Medicine

## 2015-03-22 ENCOUNTER — Telehealth: Payer: Self-pay | Admitting: *Deleted

## 2015-03-22 NOTE — Telephone Encounter (Signed)
Patient called with concerns with her arm. Stated that right arm is painful around wrist. Hurts when she works on computer but not when she golfs. Scheduled an appointment for Friday. Patient agreed.

## 2015-03-29 ENCOUNTER — Ambulatory Visit (INDEPENDENT_AMBULATORY_CARE_PROVIDER_SITE_OTHER): Payer: Medicare Other | Admitting: Internal Medicine

## 2015-03-29 ENCOUNTER — Encounter: Payer: Self-pay | Admitting: Internal Medicine

## 2015-03-29 ENCOUNTER — Other Ambulatory Visit: Payer: Self-pay | Admitting: Internal Medicine

## 2015-03-29 VITALS — BP 142/84 | HR 70 | Temp 97.6°F | Wt 189.0 lb

## 2015-03-29 DIAGNOSIS — R030 Elevated blood-pressure reading, without diagnosis of hypertension: Secondary | ICD-10-CM

## 2015-03-29 DIAGNOSIS — G5621 Lesion of ulnar nerve, right upper limb: Secondary | ICD-10-CM

## 2015-03-29 DIAGNOSIS — M25511 Pain in right shoulder: Secondary | ICD-10-CM | POA: Diagnosis not present

## 2015-03-29 NOTE — Progress Notes (Signed)
Patient ID: Patricia Cantu, female   DOB: [birth year], 74 y.o.   MRN: [id number]   Location:  [facility]  Goals of Care: Advanced Directive information Does patient have an advance directive?: Yes, Type of Advance Directive: [place];Living will, Does patient want to make changes to advanced directive?: No - Patient declined   Chief Complaint  Patient presents with  . Arm Problem    with right arm hurting after using it writing or using computer, for about month. Advil helps some.  Here with daughter Gwenette Greet    HPI: Patient is a 74 y.o. white femaleseen in the office today for acute visit due to arm pain.  Right arm and goes down into last three fingers, soemtimes on "bottom of arm" and sometimes into shoulder Hurts when writes or uses the computer for a while Can still golf Not tingly or numb Uncomfortable, just plain hurts Fingers start to bother her first--last two fingers get shaky at first Started about a month ago Takes advil some for pain Right handed Can feel a pull in the right shoulder when put her purse down on the floor Can't herself find a localized location for her pain Middle of forearm yesterday After gardening and using clippers for that was painful Right shoulder tender when she pushes on the superior aspect  Review of Systems:  Review of Systems  Musculoskeletal: Positive for myalgias and joint pain. Negative for falls.  Neurological: Positive for sensory change.    Past Medical History  Diagnosis Date  . Acute perichondritis of pinna   . Unspecified hypothyroidism   . Mixed hyperlipidemia   . Anxiety state, unspecified   . Allergic rhinitis due to pollen   . Unspecified dermatitis due to sun   . Disorder of bone and cartilage, unspecified   . Encounter for long-term (current) use of other medications     Past Surgical History  Procedure Laterality Date  . Tonsillectomy  1949    removed    . Colonoscopy      x2  . Childbirth      x2 "blocks"    No Known Allergies Medications: Patient's Medications  New Prescriptions   No medications on file  Previous Medications   ATORVASTATIN (LIPITOR) 10 MG TABLET    TAKE 1 TABLET BY MOUTH DAILY FOR CHOLESTEROL   CALCIUM PO    Take 1 tablet by mouth daily.   CHOLECALCIFEROL (VITAMIN D) 1000 UNITS TABLET    1,000 Units. Take 2 tablets daily   CITALOPRAM (CELEXA) 20 MG TABLET    TAKE 1 TABLET BY MOUTH ONCE DAILY.   FISH OIL-OMEGA-3 FATTY ACIDS 1000 MG CAPSULE    Take 2 g by mouth daily. Take one tablet once a day.   FLUTICASONE (FLONASE) 50 MCG/ACT NASAL SPRAY    Place 1 spray into both nostrils daily.   LEVOTHYROXINE (SYNTHROID, LEVOTHROID) 50 MCG TABLET    Take 1 tablet (50 mcg total) by mouth daily before breakfast.   LORAZEPAM (ATIVAN) 0.5 MG TABLET    Take 0.5 mg by mouth as needed.    MULTIPLE VITAMIN (MULTIVITAMIN) TABLET    Take 1 tablet by mouth daily.    NYSTATIN CREAM (MYCOSTATIN)    APPLY TO AREA BENEATH BREAST AS NEEDED FOR RASH TWICE A DAY.  Modified Medications   No medications on file  Discontinued Medications   No medications on file    Physical Exam: Filed Vitals:   03/29/15 0827  BP: 142/84  Pulse: 70  Temp: 97.6 F (36.4 C)  TempSrc: Oral  Weight: 189 lb (85.73 kg)  SpO2: 95%   Physical Exam  Constitutional: She is oriented to person, place, and time. She appears well-developed and well-nourished. No distress.  HENT:  Head: Normocephalic and atraumatic.  Musculoskeletal: Normal range of motion. She exhibits tenderness.  Over anterior aspect of right shoulder rotator cuff insertion, also small tender area on medial forearm in musculature; could not reproduce pain in ulnar region with typing or writing this am  Neurological: She is alert and oriented to person, place, and time. No cranial nerve deficit.    Labs reviewed: Basic Metabolic Panel:  Recent Labs  05/16/14 0829 11/26/14 0818  01/09/15 0813  NA 139 138  --   K 4.3 4.4  --   CL 99 99  --   CO2 24 24  --   GLUCOSE 86 86  --   BUN 18 15  --   CREATININE 0.95 1.04*  --   CALCIUM 9.3 9.0  --   TSH 2.980 4.650* 2.660   Liver Function Tests:  Recent Labs  05/16/14 0829 11/26/14 0818  AST 19 20  ALT 17 19  ALKPHOS 56 50  BILITOT 0.7 0.6  PROT 6.2 6.1   No results for input(s): LIPASE, AMYLASE in the last 8760 hours. No results for input(s): AMMONIA in the last 8760 hours. CBC:  Recent Labs  05/16/14 0829 11/26/14 0818  WBC 5.6 5.8  NEUTROABS 2.8 3.0  HGB 13.8 14.6  HCT 41.1 42.5  MCV 93 91   Lipid Panel:  Recent Labs  05/16/14 0829 11/26/14 0818  CHOL 170 157  HDL 70 76  LDLCALC 76 57  TRIG 119 120  CHOLHDL 2.4 2.1   Lab Results  Component Value Date   HGBA1C 5.7* 11/26/2014    Assessment/Plan 1. Right anterior shoulder pain - suspect she may have OA in shoulder with bone spurs causing irritation of rotator cuff tendons and possibly some tendonitis and nerve compression - DG Shoulder Right; Future    2. Ulnar neuropathy of right upper extremity -suspect the elbow region may be source of symptoms due to her golfing--? Tendonitis from this, but again, will r/o bony pathology first - DG Elbow Complete Right; Future  3. Blood pressure elevated without history of HTN -check bp some at home, avoid high sodium foods (ate chips last night which she normally doesn't eat)  Labs/tests ordered:   Orders Placed This Encounter  Procedures  . DG Shoulder Right    Standing Status: Future     Number of Occurrences:      Standing Expiration Date: 05/28/2016    Order Specific Question:  Reason for Exam (SYMPTOM  OR DIAGNOSIS REQUIRED)    Answer:  right shoulder tenderness on abduction; also painful last 3 digits with typing    Order Specific Question:  Preferred imaging location?    Answer:  [facility]  . DG Elbow Complete Right    Standing Status: Future     Number of  Occurrences:      Standing Expiration Date: 05/28/2016    Order Specific Question:  Reason for Exam (SYMPTOM  OR DIAGNOSIS REQUIRED)    Answer:  right last three digit pain with typing; pt golfs    Order Specific Question:  Preferred imaging location?    Answer:  [facility]    Next appt:  Keep August labs and appt  Freja Faro L.  Yamilee Harmes, D.O. [facility] [address], [place] [address] Cell Phone (Mon-Fri 8am-5pm):  [phone] On Call:  [phone] & follow prompts after 5pm & weekends Office Phone:  [phone] Office Fax:  [phone]

## 2015-03-30 ENCOUNTER — Encounter: Payer: Self-pay | Admitting: Internal Medicine

## 2015-04-01 ENCOUNTER — Ambulatory Visit
Admission: RE | Admit: 2015-04-01 | Discharge: 2015-04-01 | Disposition: A | Discharge status: Home / Self Care | Payer: Medicare Other | Source: Ambulatory Visit | Attending: Internal Medicine | Admitting: Internal Medicine

## 2015-04-01 DIAGNOSIS — M25511 Pain in right shoulder: Secondary | ICD-10-CM

## 2015-04-01 DIAGNOSIS — G5621 Lesion of ulnar nerve, right upper limb: Secondary | ICD-10-CM

## 2015-04-02 ENCOUNTER — Other Ambulatory Visit: Payer: Self-pay | Admitting: *Deleted

## 2015-04-16 ENCOUNTER — Other Ambulatory Visit: Payer: Self-pay | Admitting: *Deleted

## 2015-04-16 ENCOUNTER — Telehealth: Payer: Self-pay | Admitting: *Deleted

## 2015-04-16 DIAGNOSIS — M25511 Pain in right shoulder: Secondary | ICD-10-CM

## 2015-04-16 NOTE — Telephone Encounter (Signed)
Called patient regarding her MRI of her shoulder(right) informed her that the order has been placed and she should be getting a call to schedule this MRI.

## 2015-04-24 ENCOUNTER — Encounter: Payer: Self-pay | Admitting: Internal Medicine

## 2015-04-25 ENCOUNTER — Other Ambulatory Visit: Payer: Self-pay | Admitting: Internal Medicine

## 2015-04-25 DIAGNOSIS — E663 Overweight: Secondary | ICD-10-CM

## 2015-04-29 ENCOUNTER — Other Ambulatory Visit: Payer: Self-pay | Admitting: Internal Medicine

## 2015-05-01 ENCOUNTER — Ambulatory Visit
Admission: RE | Admit: 2015-05-01 | Discharge: 2015-05-01 | Disposition: A | Discharge status: Home / Self Care | Payer: Medicare Other | Source: Ambulatory Visit | Attending: Internal Medicine | Admitting: Internal Medicine

## 2015-05-01 ENCOUNTER — Other Ambulatory Visit: Payer: Self-pay | Admitting: *Deleted

## 2015-05-01 DIAGNOSIS — M25511 Pain in right shoulder: Secondary | ICD-10-CM

## 2015-05-01 DIAGNOSIS — S46811A Strain of other muscles, fascia and tendons at shoulder and upper arm level, right arm, initial encounter: Secondary | ICD-10-CM

## 2015-05-06 ENCOUNTER — Other Ambulatory Visit: Payer: Self-pay | Admitting: Internal Medicine

## 2015-05-06 DIAGNOSIS — S46211A Strain of muscle, fascia and tendon of other parts of biceps, right arm, initial encounter: Secondary | ICD-10-CM

## 2015-05-20 ENCOUNTER — Other Ambulatory Visit: Payer: Medicare Other

## 2015-05-20 ENCOUNTER — Other Ambulatory Visit: Payer: Self-pay

## 2015-05-20 DIAGNOSIS — R739 Hyperglycemia, unspecified: Secondary | ICD-10-CM

## 2015-05-20 DIAGNOSIS — E785 Hyperlipidemia, unspecified: Secondary | ICD-10-CM

## 2015-05-20 DIAGNOSIS — E039 Hypothyroidism, unspecified: Secondary | ICD-10-CM

## 2015-05-20 DIAGNOSIS — Z1231 Encounter for screening mammogram for malignant neoplasm of breast: Secondary | ICD-10-CM

## 2015-05-21 LAB — COMPREHENSIVE METABOLIC PANEL
ALT: 15 IU/L (ref 0–32)
AST: 18 IU/L (ref 0–40)
Albumin/Globulin Ratio: 2.1 (ref 1.1–2.5)
Albumin: 4 g/dL (ref 3.5–4.8)
Alkaline Phosphatase: 46 IU/L (ref 39–117)
BUN/Creatinine Ratio: 18 (ref 11–26)
BUN: 16 mg/dL (ref 8–27)
Bilirubin Total: 0.6 mg/dL (ref 0.0–1.2)
CO2: 23 mmol/L (ref 18–29)
Calcium: 9.1 mg/dL (ref 8.7–10.3)
Chloride: 102 mmol/L (ref 97–108)
Creatinine, Ser: 0.91 mg/dL (ref 0.57–1.00)
GFR calc Af Amer: 73 mL/min/{1.73_m2} (ref >59)
GFR calc non Af Amer: 63 mL/min/{1.73_m2} (ref >59)
Globulin, Total: 1.9 g/dL (ref 1.5–4.5)
Glucose: 93 mg/dL (ref 65–99)
Potassium: 4.8 mmol/L (ref 3.5–5.2)
Sodium: 140 mmol/L (ref 134–144)
Total Protein: 5.9 g/dL — ABNORMAL LOW (ref 6.0–8.5)

## 2015-05-21 LAB — CBC WITH DIFFERENTIAL/PLATELET
Basophils Absolute: 0.1 10*3/uL (ref 0.0–0.2)
Basos: 1 %
EOS (ABSOLUTE): 0.2 10*3/uL (ref 0.0–0.4)
Eos: 5 %
Hematocrit: 40.7 % (ref 34.0–46.6)
Hemoglobin: 13.5 g/dL (ref 11.1–15.9)
Immature Grans (Abs): 0 10*3/uL (ref 0.0–0.1)
Immature Granulocytes: 0 %
Lymphocytes Absolute: 1.5 10*3/uL (ref 0.7–3.1)
Lymphs: 31 %
MCH: 30.5 pg (ref 26.6–33.0)
MCHC: 33.2 g/dL (ref 31.5–35.7)
MCV: 92 fL (ref 79–97)
Monocytes Absolute: 0.5 10*3/uL (ref 0.1–0.9)
Monocytes: 11 %
Neutrophils Absolute: 2.5 10*3/uL (ref 1.4–7.0)
Neutrophils: 52 %
Platelets: 227 10*3/uL (ref 150–379)
RBC: 4.42 x10E6/uL (ref 3.77–5.28)
RDW: 13.9 % (ref 12.3–15.4)
WBC: 4.7 10*3/uL (ref 3.4–10.8)

## 2015-05-21 LAB — LIPID PANEL
Chol/HDL Ratio: 2.5 ratio units (ref 0.0–4.4)
Cholesterol, Total: 139 mg/dL (ref 100–199)
HDL: 56 mg/dL (ref >39)
LDL Calculated: 58 mg/dL (ref 0–99)
Triglycerides: 123 mg/dL (ref 0–149)
VLDL Cholesterol Cal: 25 mg/dL (ref 5–40)

## 2015-05-21 LAB — TSH: TSH: 1.58 u[IU]/mL (ref 0.450–4.500)

## 2015-05-21 LAB — HEMOGLOBIN A1C
Est. average glucose Bld gHb Est-mCnc: 114 mg/dL
Hgb A1c MFr Bld: 5.6 % (ref 4.8–5.6)

## 2015-05-23 ENCOUNTER — Other Ambulatory Visit: Payer: Self-pay | Admitting: Internal Medicine

## 2015-05-23 ENCOUNTER — Encounter: Payer: Self-pay | Admitting: Internal Medicine

## 2015-05-23 ENCOUNTER — Ambulatory Visit (INDEPENDENT_AMBULATORY_CARE_PROVIDER_SITE_OTHER): Payer: Medicare Other | Admitting: Internal Medicine

## 2015-05-23 VITALS — BP 130/78 | HR 72 | Temp 97.6°F | Resp 20 | Ht 64.0 in | Wt 179.5 lb

## 2015-05-23 DIAGNOSIS — G5621 Lesion of ulnar nerve, right upper limb: Secondary | ICD-10-CM

## 2015-05-23 DIAGNOSIS — E785 Hyperlipidemia, unspecified: Secondary | ICD-10-CM

## 2015-05-23 DIAGNOSIS — E039 Hypothyroidism, unspecified: Secondary | ICD-10-CM

## 2015-05-23 DIAGNOSIS — F411 Generalized anxiety disorder: Secondary | ICD-10-CM

## 2015-05-23 DIAGNOSIS — R739 Hyperglycemia, unspecified: Secondary | ICD-10-CM

## 2015-05-23 DIAGNOSIS — M25511 Pain in right shoulder: Secondary | ICD-10-CM | POA: Diagnosis not present

## 2015-05-23 MED ORDER — LEVOTHYROXINE SODIUM 50 MCG PO TABS: 50.0000 ug | ORAL_TABLET | Freq: Every day | ORAL | Status: DC

## 2015-05-23 NOTE — Progress Notes (Signed)
Patient ID: Patricia Cantu, female   DOB: 1941/01/13, 74 y.o.   MRN: [id number]   Location:  [facility]  Code Status: DNR Goals of Care: Advanced Directive information Does patient have an advance directive?: Yes, Type of Advance Directive: [place];Living will, Does patient want to make changes to advanced directive?: No - Patient declined   Chief Complaint  Patient presents with  . Medical Management of Chronic Issues    6 month follow-up, labs printed    HPI: Patient is a 74 y.o. white female seen in the office today for medical mgt of chronic diseases.    Went to see Dr. Mardelle Matte. When he reviewed the MRI, there was not a tear in the biceps tendon or her rotator cuff at this time.  He suspected it was more of a pinched nerve in her neck.  He gave her pain medication that she took two of and it stopped.  Has been working on her computer and drawing a lot.  Is taking celexa every other day.  Feels she was thrown into her anxious episodes due to her brother in [place] death.  Seemed like it got worse when she was about to see her sister.  Also was off the celexa altogether at that time.    Had some flu-like symptoms--was achy in her back.  Took an aleve and it got better.    Sugar average improved from prediabetic to normal range.  Has been taking white sugar out of her diet, husband also.  Had misbehaved after their 50 year anniversary.  Is down 10 lbs.  Would like to lose 10 more.  Eating more fruit.  Loves peaches.    Lipids are at goal.  Is taking lipitor.    Review of Systems:  Review of Systems  Constitutional: Negative for fever, chills and weight loss.  HENT: Negative for congestion and hearing loss.   Eyes: Negative for blurred vision.       Glasses  Respiratory: Negative for shortness of breath.   Cardiovascular: Negative for chest pain, palpitations and leg swelling.  Gastrointestinal: Negative for abdominal pain,  constipation, blood in stool and melena.  Genitourinary: Negative for dysuria, urgency and frequency.  Musculoskeletal: Negative for myalgias and falls.  Skin: Negative for itching and rash.  Neurological: Negative for dizziness, loss of consciousness and weakness.  Endo/Heme/Allergies: Bruises/bleeds easily.  Psychiatric/Behavioral: Negative for depression and memory loss. The patient is not nervous/anxious.        Anxiety currently under good control with celexa every other day    Past Medical History  Diagnosis Date  . Acute perichondritis of pinna   . Unspecified hypothyroidism   . Mixed hyperlipidemia   . Anxiety state, unspecified   . Allergic rhinitis due to pollen   . Unspecified dermatitis due to sun   . Disorder of bone and cartilage, unspecified   . Encounter for long-term (current) use of other medications     Past Surgical History  Procedure Laterality Date  . Tonsillectomy  1949    removed  . Colonoscopy      x2  . Childbirth      x2 "blocks"    No Known Allergies Medications: Patient's Medications  New Prescriptions   No medications on file  Previous Medications   ATORVASTATIN (LIPITOR) 10 MG TABLET    TAKE 1 TABLET BY MOUTH DAILY FOR CHOLESTEROL   CALCIUM PO    Take 1 tablet by mouth  daily.   CHOLECALCIFEROL (VITAMIN D) 1000 UNITS TABLET    1,000 Units. Take 2 tablets daily   CITALOPRAM (CELEXA) 20 MG TABLET    TAKE 1 TABLET BY MOUTH ONCE DAILY.   FISH OIL-OMEGA-3 FATTY ACIDS 1000 MG CAPSULE    Take 2 g by mouth daily. Take one tablet once a day.   FLUTICASONE (FLONASE) 50 MCG/ACT NASAL SPRAY    Place 1 spray into both nostrils daily.   LEVOTHYROXINE (SYNTHROID, LEVOTHROID) 25 MCG TABLET    TAKE 1 AND 1/2 TABLETS BY MOUTH ONCE DAILY FOR THYROID.   LEVOTHYROXINE (SYNTHROID, LEVOTHROID) 50 MCG TABLET    Take 1 tablet (50 mcg total) by mouth daily before breakfast.   LORAZEPAM (ATIVAN) 0.5 MG TABLET    Take 0.5 mg by mouth as needed.    MULTIPLE VITAMIN  (MULTIVITAMIN) TABLET    Take 1 tablet by mouth daily.    NYSTATIN CREAM (MYCOSTATIN)    APPLY TO AREA BENEATH BREAST AS NEEDED FOR RASH TWICE A DAY.  Modified Medications   No medications on file  Discontinued Medications   No medications on file    Physical Exam: Filed Vitals:   05/23/15 0827  BP: 130/78  Pulse: 72  Temp: 97.6 F (36.4 C)  TempSrc: Oral  Resp: 20  Height: 5\' 4"  (1.626 m)  Weight: 179 lb 8 oz (81.421 kg)  SpO2: 96%   Physical Exam  Constitutional: She is oriented to person, place, and time. She appears well-developed and well-nourished. No distress.  Cardiovascular: Normal rate, regular rhythm, normal heart sounds and intact distal pulses.   Pulmonary/Chest: Effort normal and breath sounds normal. No respiratory distress.  Abdominal: Soft. Bowel sounds are normal.  Musculoskeletal: Normal range of motion. She exhibits no edema or tenderness.  Neurological: She is alert and oriented to person, place, and time.  Skin: Skin is warm and dry.  Psychiatric: She has a normal mood and affect. Her behavior is normal. Judgment and thought content normal.    Labs reviewed: Basic Metabolic Panel:  Recent Labs  11/26/14 0818 01/09/15 0813 05/20/15 0813  NA 138  --  140  K 4.4  --  4.8  CL 99  --  102  CO2 24  --  23  GLUCOSE 86  --  93  BUN 15  --  16  CREATININE 1.04*  --  0.91  CALCIUM 9.0  --  9.1  TSH 4.650* 2.660 1.580   Liver Function Tests:  Recent Labs  11/26/14 0818 05/20/15 0813  AST 20 18  ALT 19 15  ALKPHOS 50 46  BILITOT 0.6 0.6  PROT 6.1 5.9*   No results for input(s): LIPASE, AMYLASE in the last 8760 hours. No results for input(s): AMMONIA in the last 8760 hours. CBC:  Recent Labs  11/26/14 0818 05/20/15 0819  WBC 5.8 4.7  NEUTROABS 3.0 2.5  HGB 14.6  --   HCT 42.5 40.7  MCV 91  --    Lipid Panel:  Recent Labs  11/26/14 0818 05/20/15 0813  CHOL 157 139  HDL 76 56  LDLCALC 57 58  TRIG 120 123  CHOLHDL 2.1 2.5    Lab Results  Component Value Date   HGBA1C 5.6 05/20/2015    Procedures since last visit: Reviewed her xrays, MRI and visit with Dr. Mardelle Matte.  Assessment/Plan 1. Right anterior shoulder pain -she does not have this anymore  -seems time and two pills from Dr. Mardelle Matte (unclear which pills) fixed this -still  unclear what brought it on--golf, other exercise? -Dr. Luanna Cole impression was that it was neck related  2. Ulnar neuropathy of right upper extremity -has also resolved -may have also originated from her neck  3. Hypothyroidism, unspecified hypothyroidism type - cont current synthroid 37.40mcg daily - TSH; Future  4. Generalized anxiety disorder -cont celexa 20mg  every other day -well controlled at this time  5. Hyperglycemia - cont dietary changes and appt with dietitian as scheduled  - Hemoglobin A1c; Future  6. Hyperlipidemia with target LDL less than 100 -cont dietary changes and lipitor 10mg  and dietitian appt upcoming - Comprehensive metabolic panel; Future - CBC with Differential/Platelet; Future - Lipid panel; Future  Labs/tests ordered:   Orders Placed This Encounter  Procedures  . Hemoglobin A1c    Standing Status: Future     Number of Occurrences:      Standing Expiration Date: 05/22/2016  . Comprehensive metabolic panel    Standing Status: Future     Number of Occurrences:      Standing Expiration Date: 05/22/2016    Order Specific Question:  Has the patient fasted?    Answer:  Yes  . CBC with Differential/Platelet    Standing Status: Future     Number of Occurrences:      Standing Expiration Date: 05/22/2016  . TSH    Standing Status: Future     Number of Occurrences:      Standing Expiration Date: 05/22/2016  . Lipid panel    Standing Status: Future     Number of Occurrences:      Standing Expiration Date: 05/22/2016    Order Specific Question:  Has the patient fasted?    Answer:  Yes    Next appt:  6 mos for annual exam with labs  before  [place]. Darcelle Herrada, D.O. [facility] [address], [place] [address] Cell Phone (Mon-Fri 8am-5pm):  [phone] On Call:  [phone] & follow prompts after 5pm & weekends Office Phone:  [phone] Office Fax:  [phone]

## 2015-05-24 ENCOUNTER — Other Ambulatory Visit: Payer: Self-pay | Admitting: Internal Medicine

## 2015-06-03 ENCOUNTER — Encounter: Payer: Self-pay | Admitting: Dietician

## 2015-06-03 ENCOUNTER — Encounter: Payer: Medicare Other | Attending: Internal Medicine | Admitting: Dietician

## 2015-06-03 DIAGNOSIS — Z713 Dietary counseling and surveillance: Secondary | ICD-10-CM | POA: Insufficient documentation

## 2015-06-03 DIAGNOSIS — E663 Overweight: Secondary | ICD-10-CM | POA: Insufficient documentation

## 2015-06-03 DIAGNOSIS — Z6830 Body mass index (BMI) 30.0-30.9, adult: Secondary | ICD-10-CM | POA: Insufficient documentation

## 2015-06-03 NOTE — Progress Notes (Signed)
  Medical Nutrition Therapy:  Appt start time: 4888 end time:  1130.   Assessment:  Primary concerns today: Patricia Cantu and her husband are here today to discuss increasing weight. They report that they have tried many diets over the last few decades. They would like to maintain/lose weight. They usually cook at home; they may eat out 1-2x a week. They come with many questions regarding types of sugar and insulin response.  Preferred Learning Style:   No preference indicated   Learning Readiness:   Ready   MEDICATIONS: see list   DIETARY INTAKE:  Usual eating pattern includes 3 meals and 1-2 snacks per day.  24-hr recall:  B ( AM): grapenuts with fruit and 1% milk OR 2 egg whites, toast, bacon, and fruit  Snk ( AM):   L ( PM): Light Greek yogurt with almonds, [name] and cheese sandwich OR salad OR soup/chili Snk ( PM): multigrain crackers D ( PM): pork tenderloin/chicken/scallops, sweet potato, salad OR pizza OR beef tacos OR lean turkey/beef burger Snk ( PM): sugar free popsicle or fresh fruit  Beverages: black coffee, water,   Usual physical activity: none  Estimated energy needs: 1400-1600 calories 158-180 g carbohydrates 105-120 g protein 39-44 g fat  Progress Towards Goal(s):  In progress.   Nutritional Diagnosis:  [place]-3.3 Overweight/obesity As related to large portion sizes and physical inactivity.  As evidenced by physician referral for overweight (patient declined an updated weight today).    Intervention:  Nutrition education provided. Answered the patient's questions regarding meal patterns and macronutrient balance. Discouraged fasting; recommended regular, balanced meals and exercise for weight loss/maintenance. Recommended breaking cycle of dieting and working to maintain a longterm healthy lifestyle.   Teaching Method Utilized:  Visual Auditory Hands on  Handouts given during visit include:  MyPlate  Meal planning card  Barriers to learning/adherence to  lifestyle change: none  Demonstrated degree of understanding via:  Teach Back   Monitoring/Evaluation:  Dietary intake, exercise, and body weight prn.

## 2015-06-06 ENCOUNTER — Encounter: Payer: Self-pay | Admitting: Internal Medicine

## 2015-06-06 ENCOUNTER — Other Ambulatory Visit: Payer: Self-pay

## 2015-06-06 DIAGNOSIS — Z780 Asymptomatic menopausal state: Secondary | ICD-10-CM

## 2015-06-26 ENCOUNTER — Ambulatory Visit: Payer: Medicare Other

## 2015-07-03 ENCOUNTER — Ambulatory Visit
Admission: RE | Admit: 2015-07-03 | Discharge: 2015-07-03 | Disposition: A | Discharge status: Home / Self Care | Payer: Medicare Other | Source: Ambulatory Visit

## 2015-07-03 DIAGNOSIS — Z1231 Encounter for screening mammogram for malignant neoplasm of breast: Secondary | ICD-10-CM

## 2015-07-12 ENCOUNTER — Telehealth: Payer: Self-pay | Admitting: *Deleted

## 2015-07-12 NOTE — Telephone Encounter (Signed)
Patient called and stated that she has Poison Oak/[place]vy since Monday after playing golf. Stated that it started on her legs and she is Itching and wants to know what she can take since she can't get into office today. Please Advise.

## 2015-07-12 NOTE — Telephone Encounter (Signed)
Topically, she can apply hydrocortisone cream over the counter from the pharmacy ie "cortisone 2".  Calamine lotion also can be helpful.   If it is severe, we can send in a prednisone pack to treat it (10mg  pack, use as directed).

## 2015-07-12 NOTE — Telephone Encounter (Signed)
Spoke with patient regarding her poison oak/ivy , I informed her that Dr. Mariea Clonts recommends that she try hydrocortisone cream or calamine lotion that she can get OTC and if severe we could call something in for her. She stated that she would try the OTC medications first and if it gets worse she will give the office a call back.

## 2015-07-16 MED ORDER — PREDNISONE 10 MG (21) PO TBPK: ORAL_TABLET | ORAL | Status: DC

## 2015-07-16 NOTE — Addendum Note (Signed)
Addended by: Rafael Bihari A on: 07/16/2015 10:48 AM   Modules accepted: Orders

## 2015-07-16 NOTE — Telephone Encounter (Signed)
Patient called and stated that she has tried the OTC itching creams and still the itching is persistent. Per Dr. Magdalene Molly note Prednisone Pack can be used. Faxed to pharmacy and patient notified.

## 2015-07-26 ENCOUNTER — Encounter (INDEPENDENT_AMBULATORY_CARE_PROVIDER_SITE_OTHER): Payer: Medicare Other | Admitting: Internal Medicine

## 2015-07-26 DIAGNOSIS — Z23 Encounter for immunization: Secondary | ICD-10-CM | POA: Diagnosis not present

## 2015-07-29 ENCOUNTER — Telehealth: Payer: Self-pay | Admitting: *Deleted

## 2015-07-29 NOTE — Telephone Encounter (Signed)
Recommend she waits on Dr Mariea Clonts for further f/u.

## 2015-07-29 NOTE — Telephone Encounter (Signed)
Patient called and stated that she is still itching around her elbows and knees. Dr. Mariea Clonts had called in a Prednisone pac and patient finished the 7 day coarse, but still complaining about itching. Would like to know what else can be done. Please Advise.

## 2015-07-30 MED ORDER — PREDNISONE 10 MG (21) PO TBPK: ORAL_TABLET | ORAL | Status: DC

## 2015-07-30 NOTE — Telephone Encounter (Signed)
Patient notified and agreed. Faxed Rx to pharmacy.  

## 2015-07-30 NOTE — Telephone Encounter (Signed)
Repeat pred pack for one more course.  If not better, call me back.

## 2015-08-12 ENCOUNTER — Other Ambulatory Visit: Payer: Self-pay | Admitting: Nurse Practitioner

## 2015-08-17 ENCOUNTER — Other Ambulatory Visit: Payer: Self-pay | Admitting: Internal Medicine

## 2015-10-22 ENCOUNTER — Ambulatory Visit
Admission: RE | Admit: 2015-10-22 | Discharge: 2015-10-22 | Disposition: A | Discharge status: Home / Self Care | Payer: Medicare Other | Source: Ambulatory Visit | Attending: Internal Medicine | Admitting: Internal Medicine

## 2015-10-22 DIAGNOSIS — Z780 Asymptomatic menopausal state: Secondary | ICD-10-CM

## 2015-11-14 ENCOUNTER — Other Ambulatory Visit: Payer: Self-pay | Admitting: Internal Medicine

## 2015-11-25 ENCOUNTER — Other Ambulatory Visit: Payer: Medicare Other

## 2015-11-25 DIAGNOSIS — R739 Hyperglycemia, unspecified: Secondary | ICD-10-CM

## 2015-11-25 DIAGNOSIS — E039 Hypothyroidism, unspecified: Secondary | ICD-10-CM | POA: Diagnosis not present

## 2015-11-25 DIAGNOSIS — E785 Hyperlipidemia, unspecified: Secondary | ICD-10-CM

## 2015-11-26 DIAGNOSIS — L570 Actinic keratosis: Secondary | ICD-10-CM | POA: Diagnosis not present

## 2015-11-26 LAB — CBC WITH DIFFERENTIAL/PLATELET
Basophils Absolute: 0.1 10*3/uL (ref 0.0–0.2)
Basos: 1 %
EOS (ABSOLUTE): 0.2 10*3/uL (ref 0.0–0.4)
Eos: 5 %
Hematocrit: 43 % (ref 34.0–46.6)
Hemoglobin: 14.3 g/dL (ref 11.1–15.9)
Immature Grans (Abs): 0 10*3/uL (ref 0.0–0.1)
Immature Granulocytes: 0 %
Lymphocytes Absolute: 1.6 10*3/uL (ref 0.7–3.1)
Lymphs: 32 %
MCH: 30.6 pg (ref 26.6–33.0)
MCHC: 33.3 g/dL (ref 31.5–35.7)
MCV: 92 fL (ref 79–97)
Monocytes Absolute: 0.5 10*3/uL (ref 0.1–0.9)
Monocytes: 10 %
Neutrophils Absolute: 2.7 10*3/uL (ref 1.4–7.0)
Neutrophils: 52 %
Platelets: 248 10*3/uL (ref 150–379)
RBC: 4.68 x10E6/uL (ref 3.77–5.28)
RDW: 13.2 % (ref 12.3–15.4)
WBC: 5.1 10*3/uL (ref 3.4–10.8)

## 2015-11-26 LAB — COMPREHENSIVE METABOLIC PANEL
ALT: 14 IU/L (ref 0–32)
AST: 18 IU/L (ref 0–40)
Albumin/Globulin Ratio: 1.9 (ref 1.1–2.5)
Albumin: 4 g/dL (ref 3.5–4.8)
Alkaline Phosphatase: 50 IU/L (ref 39–117)
BUN/Creatinine Ratio: 24 (ref 11–26)
BUN: 22 mg/dL (ref 8–27)
Bilirubin Total: 0.6 mg/dL (ref 0.0–1.2)
CO2: 27 mmol/L (ref 18–29)
Calcium: 9.2 mg/dL (ref 8.7–10.3)
Chloride: 99 mmol/L (ref 96–106)
Creatinine, Ser: 0.91 mg/dL (ref 0.57–1.00)
GFR calc Af Amer: 72 mL/min/{1.73_m2} (ref >59)
GFR calc non Af Amer: 63 mL/min/{1.73_m2} (ref >59)
Globulin, Total: 2.1 g/dL (ref 1.5–4.5)
Glucose: 93 mg/dL (ref 65–99)
Potassium: 4.5 mmol/L (ref 3.5–5.2)
Sodium: 140 mmol/L (ref 134–144)
Total Protein: 6.1 g/dL (ref 6.0–8.5)

## 2015-11-26 LAB — HEMOGLOBIN A1C
Est. average glucose Bld gHb Est-mCnc: 117 mg/dL
Hgb A1c MFr Bld: 5.7 % — ABNORMAL HIGH (ref 4.8–5.6)

## 2015-11-26 LAB — LIPID PANEL
Chol/HDL Ratio: 2.4 ratio units (ref 0.0–4.4)
Cholesterol, Total: 159 mg/dL (ref 100–199)
HDL: 66 mg/dL (ref >39)
LDL Calculated: 70 mg/dL (ref 0–99)
Triglycerides: 114 mg/dL (ref 0–149)
VLDL Cholesterol Cal: 23 mg/dL (ref 5–40)

## 2015-11-26 LAB — TSH: TSH: 1.63 u[IU]/mL (ref 0.450–4.500)

## 2015-11-28 ENCOUNTER — Ambulatory Visit (INDEPENDENT_AMBULATORY_CARE_PROVIDER_SITE_OTHER): Payer: Medicare Other | Admitting: Internal Medicine

## 2015-11-28 ENCOUNTER — Encounter: Payer: Self-pay | Admitting: Internal Medicine

## 2015-11-28 VITALS — BP 122/78 | HR 81 | Temp 97.7°F | Resp 20 | Ht 64.0 in | Wt 189.8 lb

## 2015-11-28 DIAGNOSIS — Z0000 Encounter for general adult medical examination without abnormal findings: Secondary | ICD-10-CM

## 2015-11-28 DIAGNOSIS — N3941 Urge incontinence: Secondary | ICD-10-CM | POA: Diagnosis not present

## 2015-11-28 DIAGNOSIS — E039 Hypothyroidism, unspecified: Secondary | ICD-10-CM | POA: Diagnosis not present

## 2015-11-28 DIAGNOSIS — E785 Hyperlipidemia, unspecified: Secondary | ICD-10-CM | POA: Diagnosis not present

## 2015-11-28 DIAGNOSIS — T2121XA Burn of second degree of chest wall, initial encounter: Secondary | ICD-10-CM | POA: Insufficient documentation

## 2015-11-28 DIAGNOSIS — F411 Generalized anxiety disorder: Secondary | ICD-10-CM

## 2015-11-28 DIAGNOSIS — R739 Hyperglycemia, unspecified: Secondary | ICD-10-CM

## 2015-11-28 NOTE — Patient Instructions (Signed)
Kegel Exercises  The goal of Kegel exercises is to isolate and exercise your pelvic floor muscles. These muscles act as a hammock that supports the rectum, vagina, small intestine, and uterus. As the muscles weaken, the hammock sags and these organs are displaced from their normal positions. Kegel exercises can strengthen your pelvic floor muscles and help you to improve bladder and bowel control, improve sexual response, and help reduce many problems and some discomfort during pregnancy. Kegel exercises can be done anywhere and at any time.  HOW TO PERFORM KEGEL EXERCISES  1. Locate your pelvic floor muscles. To do this, squeeze (contract) the muscles that you use when you try to stop the flow of urine. You will feel a tightness in the vaginal area (women) and a tight lift in the rectal area (men and women).  2. When you begin, contract your pelvic muscles tight for 2-5 seconds, then relax them for 2-5 seconds. This is one set. Do 4-5 sets with a short pause in between.  3. Contract your pelvic muscles for 8-10 seconds, then relax them for 8-10 seconds. Do 4-5 sets. If you cannot contract your pelvic muscles for 8-10 seconds, try 5-7 seconds and work your way up to 8-10 seconds. Your goal is 4-5 sets of 10 contractions each day.  Keep your stomach, buttocks, and legs relaxed during the exercises. Perform sets of both short and long contractions. Vary your positions. Perform these contractions 3-4 times per day. Perform sets while you are:    · Lying in bed in the morning.  · Standing at lunch.  · Sitting in the late afternoon.  · Lying in bed at night.   You should do 40-50 contractions per day. Do not perform more Kegel exercises per day than recommended. Overexercising can cause muscle fatigue. Continue these exercises for for at least 15-20 weeks or as directed by your caregiver.     This information is not intended to replace advice given to you by your health care provider. Make sure you discuss any questions  you have with your health care provider.     Document Released: 09/07/2012 Document Revised: 10/12/2014 Document Reviewed: 09/07/2012  [employer]vier Interactive Patient Education ©2016 [employer]

## 2015-11-28 NOTE — Progress Notes (Signed)
Patient ID: Patricia Cantu, female   DOB: 09/24/1941, 75 y.o.   MRN: [id number]  [facility] clinic Provider: Takeshi Teasdale L. Mariea Clonts, D.O., C.M.D.  Patient Care Team: Gayland Curry, DO as PCP - General (Geriatric Medicine)  Extended Emergency Contact Information Primary Emergency Contact: Frankie,Kenneth Address: [employer]          [place], [place] [address] [name] of [place] Phone: [phone] Relation: Spouse  Code Status: DNR Goals of Care: Advanced Directive information Advanced Directives 11/28/2015  Does patient have an advance directive? Yes  Type of Advance Directive Out of facility DNR (pink MOST or yellow form)  Does patient want to make changes to advanced directive? No - Patient declined  Copy of advanced directive(s) in chart? Yes   Chief Complaint  Patient presents with  . Annual Exam  . Medical Management of Chronic Issues    HPI: Patient is a 75 y.o. female seen in today for an annual wellness exam and med mgt of chronic diseases.    Depression screen [facility] 2/9 11/28/2015 06/03/2015 11/29/2014 11/23/2013 04/27/2013  Decreased Interest 0 0 0 0 0  Down, Depressed, Hopeless 0 0 0 0 0  PHQ - 2 Score 0 0 0 0 0    Fall Risk  11/28/2015 06/03/2015 05/23/2015 11/29/2014 05/21/2014  Falls in the past year? No No No No No   MMSE - Mini Mental State Exam 11/28/2015  Not completed: (No Data)  Orientation to time 5  Orientation to Place 5  Registration 3  Attention/ Calculation 5  Recall 2  Language- name 2 objects 2  Language- repeat 1  Language- follow 3 step command 3  Language- read & follow direction 1  Write a sentence 1  Copy design 1  Total score 29     Health Maintenance  Topic Date Due  . INFLUENZA VACCINE  05/05/2016  . MAMMOGRAM  07/02/2017  . TETANUS/TDAP  10/05/2017  . DEXA SCAN  Completed  . ZOSTAVAX  Completed  . PNA vac Low Risk Adult  Completed    Urinary incontinence? Small amts of urge incontinence--kegels recommended. Functional Status  Survey: Is the patient deaf or have difficulty hearing?: No Does the patient have difficulty seeing, even when wearing glasses/contacts?: No Does the patient have difficulty concentrating, remembering, or making decisions?: No Does the patient have difficulty walking or climbing stairs?: No Does the patient have difficulty dressing or bathing?: No Does the patient have difficulty doing errands alone such as visiting a doctor's office or shopping?: No Exercise?  Not exercising like she knows she should.   Diet?  Not following completely with her paleo diet.  She is still eating less bread. Didn't gain or lose.  Her husband is completely off the schedule.   Eyes:  Saw ophtho within the year.  Gets her pressure and visual field checks.  Has progressive lenses.     Hearing:  No problem. Dentition:  No difficulty with this. Chewing well.   Pain:  No pains.  [facility] [employer] seemed to be cause of her elbow pain.  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  Doing knee loosening things she learned at tai chi.    Sees [facility] for her face and arm areas--gets a medication on her and then a blue light put on her--[place].  Stays on for 27 mins.  Then there's a fan to keep her cool.    Past Medical History  Diagnosis Date  . Acute  perichondritis of pinna   . Unspecified hypothyroidism   . Mixed hyperlipidemia   . Anxiety state, unspecified   . Allergic rhinitis due to pollen   . Unspecified dermatitis due to sun   . Disorder of bone and cartilage, unspecified   . Encounter for long-term (current) use of other medications     Past Surgical History  Procedure Laterality Date  . Tonsillectomy  1949    removed  . Colonoscopy      x2  . Childbirth      x2 "blocks"    Social History   Social History  . Marital Status: Married    Spouse Name: N/A  . Number of Children: N/A  . Years of Education: N/A   Occupational History  . Not on file.   Social History Main Topics   . Smoking status: Never Smoker   . Smokeless tobacco: Never Used  . Alcohol Use: 1.2 oz/week    2 Glasses of wine per week  . Drug Use: No  . Sexual Activity: Yes   Other Topics Concern  . Not on file   Social History Narrative    No Known Allergies    Medication List       This list is accurate as of: 11/28/15  2:40 PM.  Always use your most recent med list.               atorvastatin 10 MG tablet  Commonly known as:  LIPITOR  TAKE 1 TABLET BY MOUTH DAILY FOR CHOLESTEROL     CALCIUM PO  Take 1 tablet by mouth daily.     cholecalciferol 1000 units tablet  Commonly known as:  VITAMIN D  1,000 Units. Take 2 tablets daily     citalopram 20 MG tablet  Commonly known as:  CELEXA  TAKE 1 TABLET BY MOUTH ONCE DAILY.     fish oil-omega-3 fatty acids 1000 MG capsule  Take 2 g by mouth daily. Take one tablet once a day.     fluticasone 50 MCG/ACT nasal spray  Commonly known as:  FLONASE  PLACE 1 SPRAY INTO BOTH NOSTRILS DAILY.     levothyroxine 50 MCG tablet  Commonly known as:  SYNTHROID, LEVOTHROID  TAKE 1 TABLET (50 MCG TOTAL) BY MOUTH DAILY BEFORE BREAKFAST.     LORazepam 0.5 MG tablet  Commonly known as:  ATIVAN  Take 0.5 mg by mouth as needed.     multivitamin tablet  Take 1 tablet by mouth daily.     nystatin cream  Commonly known as:  MYCOSTATIN  APPLY TO AREA BENEATH BREAST AS NEEDED FOR RASH TWICE A DAY.         Review of Systems:  Review of Systems  Constitutional: Negative for fever, activity change, appetite change, fatigue and unexpected weight change.  HENT: Negative for congestion and hearing loss.   Eyes: Negative for visual disturbance.  Respiratory: Negative for chest tightness and shortness of breath.   Cardiovascular: Negative for chest pain and leg swelling.  Gastrointestinal: Negative for nausea, vomiting, abdominal pain, diarrhea and constipation.  Endocrine: Negative for polyuria.  Genitourinary: Positive for urgency. Negative  for dysuria, frequency and difficulty urinating.  Musculoskeletal: Positive for arthralgias. Negative for myalgias, back pain, joint swelling and gait problem.  Skin: Negative for color change, pallor and rash.       Had actinic keratoses removed  Neurological: Negative for dizziness, weakness and numbness.       Numbness in finger resolved and  pain in elbow as well  Hematological: Negative for adenopathy.  Psychiatric/Behavioral: Negative for confusion and dysphoric mood. The patient is not nervous/anxious.        Anxiety well controlled with celexa    Physical Exam: Filed Vitals:   11/28/15 1345  BP: 122/78  Pulse: 81  Temp: 97.7 F (36.5 C)  TempSrc: Oral  Resp: 20  Height: 5' 4"  (1.626 m)  Weight: 189 lb 12.8 oz (86.093 kg)  SpO2: 97%   Body mass index is 32.56 kg/(m^2). Physical Exam  Constitutional: She is oriented to person, place, and time. She appears well-developed and well-nourished. No distress.  HENT:  Head: Normocephalic and atraumatic.  Right Ear: External ear normal.  Left Ear: External ear normal.  Nose: Nose normal.  Mouth/Throat: Oropharynx is clear and moist. No oropharyngeal exudate.  Eyes: Conjunctivae and EOM are normal. Pupils are equal, round, and reactive to light. No scleral icterus.  Neck: Normal range of motion. Neck supple. No JVD present. No tracheal deviation present. No thyromegaly present.  Cardiovascular: Normal rate, regular rhythm, normal heart sounds and intact distal pulses.  Exam reveals no gallop and no friction rub.   No murmur heard. Pulmonary/Chest: Effort normal and breath sounds normal. No respiratory distress. She has no wheezes. She has no rales. She exhibits no tenderness. Right breast exhibits no inverted nipple, no mass, no nipple discharge, no skin change and no tenderness. Left breast exhibits no inverted nipple, no mass, no nipple discharge, no skin change and no tenderness. Breasts are symmetrical.  Abdominal: Soft. Bowel  sounds are normal. She exhibits no distension and no mass. There is no tenderness. There is no rebound and no guarding.  Musculoskeletal: Normal range of motion. She exhibits no edema or tenderness.  Lymphadenopathy:    She has no cervical adenopathy.    She has no axillary adenopathy.       Right axillary: No pectoral adenopathy present.       Left axillary: No pectoral adenopathy present.      Right: No supraclavicular adenopathy present.       Left: No supraclavicular adenopathy present.  Neurological: She is alert and oriented to person, place, and time. She has normal reflexes. No cranial nerve deficit. She exhibits normal muscle tone. Coordination normal.  Skin: Skin is warm and dry. No rash noted. No erythema. No pallor.  Some areas of hyperpigmentation from sun exposure, AKs on back, chest  Psychiatric: She has a normal mood and affect. Her behavior is normal. Judgment normal.    Labs reviewed: Basic Metabolic Panel:  Recent Labs  01/09/15 0813 05/20/15 0813 11/25/15 0824  NA  --  140 140  K  --  4.8 4.5  CL  --  102 99  CO2  --  23 27  GLUCOSE  --  93 93  BUN  --  16 22  CREATININE  --  0.91 0.91  CALCIUM  --  9.1 9.2  TSH 2.660 1.580 1.630   Liver Function Tests:  Recent Labs  05/20/15 0813 11/25/15 0824  AST 18 18  ALT 15 14  ALKPHOS 46 50  BILITOT 0.6 0.6  PROT 5.9* 6.1  ALBUMIN 4.0 4.0   No results for input(s): LIPASE, AMYLASE in the last 8760 hours. No results for input(s): AMMONIA in the last 8760 hours. CBC:  Recent Labs  05/20/15 0819 11/25/15 0824  WBC 4.7 5.1  NEUTROABS 2.5 2.7  HCT 40.7 43.0  MCV 92 92  PLT 227 248  Lipid Panel:  Recent Labs  05/20/15 0813 11/25/15 0824  CHOL 139 159  HDL 56 66  LDLCALC 58 70  TRIG 123 114  CHOLHDL 2.5 2.4   Lab Results  Component Value Date   HGBA1C 5.7* 11/25/2015   EKG today NSR with HR 58, low voltage, no significant changes since 03/13/14  Assessment/Plan 1. Medicare annual  wellness visit, subsequent -up to date on all preventive care and vaccines -see hpi  2. Hypothyroidism, unspecified hypothyroidism type -stable, cont current synthroid 54mg  3. Generalized anxiety disorder -stable, cont celexa and has ativan on hand but has not needed it  4. Hyperglycemia -hba1c stable -cont to work on diet and exercise  5. Hyperlipidemia with target LDL less than 100 -LDL below goal at 70, HDL is high and protective, cont to work on diet and exercise  6.  Urge incontinence - given kegel exercise handout to work on this  Labs/tests ordered:   Orders Placed This Encounter  Procedures  . TSH    Standing Status: Future     Number of Occurrences:      Standing Expiration Date: 11/27/2016  . Hemoglobin A1c    Standing Status: Future     Number of Occurrences:      Standing Expiration Date: 11/27/2016  . Lipid panel    Standing Status: Future     Number of Occurrences:      Standing Expiration Date: 11/27/2016    Order Specific Question:  Has the patient fasted?    Answer:  Yes  . Basic metabolic panel    Standing Status: Future     Number of Occurrences:      Standing Expiration Date: 11/27/2016    Order Specific Question:  Has the patient fasted?    Answer:  Yes    Next appt:  6 mos with labs before  Leaann Nevils L. Payson Evrard, D.O. [employer] [address] [place] [address]Cell Phone (Mon-Fri 8am-5pm):  [phone] Call:  [phone]& follow prompts after 5pm & weekends Office Phone:  [phone] Fax:  [phone]

## 2015-12-13 ENCOUNTER — Other Ambulatory Visit: Payer: Self-pay

## 2015-12-13 MED ORDER — LEVOTHYROXINE SODIUM 50 MCG PO TABS: ORAL_TABLET | ORAL | Status: DC

## 2015-12-28 NOTE — Progress Notes (Signed)
This encounter was created in error - please disregard.

## 2015-12-30 NOTE — Addendum Note (Signed)
Addended by: Logan Bores on: 12/30/2015 08:47 AM   Modules accepted: Level of Service

## 2016-03-19 ENCOUNTER — Telehealth: Payer: Self-pay | Admitting: *Deleted

## 2016-03-19 MED ORDER — PREDNISONE 5 MG PO TABS: ORAL_TABLET | ORAL | Status: DC

## 2016-03-19 NOTE — Telephone Encounter (Signed)
Prednisone taper with 5mg  pills: 4pills for one day, 3 pills for one day, 2 pills for one day, 1 pill for one day, then stop #10

## 2016-03-19 NOTE — Telephone Encounter (Signed)
Patient notified and agreed.  

## 2016-03-19 NOTE — Telephone Encounter (Signed)
Patient called and stated that she has gotten into some Poison Ivy and itching. Has tried Benadryl and Calamine lotion with no relief. Would like to know if you can call her something in for this. Please Advise.

## 2016-03-24 DIAGNOSIS — L821 Other seborrheic keratosis: Secondary | ICD-10-CM | POA: Diagnosis not present

## 2016-03-24 DIAGNOSIS — L304 Erythema intertrigo: Secondary | ICD-10-CM | POA: Diagnosis not present

## 2016-03-24 DIAGNOSIS — D1801 Hemangioma of skin and subcutaneous tissue: Secondary | ICD-10-CM | POA: Diagnosis not present

## 2016-03-24 DIAGNOSIS — L570 Actinic keratosis: Secondary | ICD-10-CM | POA: Diagnosis not present

## 2016-03-24 DIAGNOSIS — L237 Allergic contact dermatitis due to plants, except food: Secondary | ICD-10-CM | POA: Diagnosis not present

## 2016-03-24 DIAGNOSIS — L814 Other melanin hyperpigmentation: Secondary | ICD-10-CM | POA: Diagnosis not present

## 2016-05-11 ENCOUNTER — Ambulatory Visit (INDEPENDENT_AMBULATORY_CARE_PROVIDER_SITE_OTHER): Payer: Medicare Other | Admitting: Internal Medicine

## 2016-05-11 ENCOUNTER — Encounter: Payer: Self-pay | Admitting: Internal Medicine

## 2016-05-11 VITALS — BP 130/80 | HR 59 | Temp 98.4°F | Wt 195.0 lb

## 2016-05-11 DIAGNOSIS — M7701 Medial epicondylitis, right elbow: Secondary | ICD-10-CM

## 2016-05-11 NOTE — Patient Instructions (Signed)
Medial Epicondylitis With Rehab Medial epicondylitis involves inflammation and pain around the inner (medial) portion of the elbow. This pain is caused by inflammation of the tendons in the forearm that flex (bring down) the wrist. Medial epicondylitis is also called golfer's elbow, because it is common among golfers. However, it may occur in any individual who flexes the wrist regularly. If medial epicondylitis is left untreated, it may become a chronic problem. SYMPTOMS   Pain, tenderness, or inflammation over the inner (medial) side of the elbow.  Pain or weakness with gripping activities.  Pain that increases with wrist twisting motions (using a screwdriver, playing golf, bowling). CAUSES  Medial epicondylitis is caused by inflammation of the tendons that flex the wrist. Causes of injury may include:  Chronic, repetitive stress and strain to the tendons that run from the wrist and forearm to the elbow.  Sudden strain on the forearm, including wrist snap when serving balls with racquet sports, or throwing a baseball. RISK INCREASES WITH:  Sports or occupations that require repetitive and/or strenuous forearm and wrist movements (pitching a baseball, golfing, carpentry).  Poor wrist and forearm strength and flexibility.  Failure to warm up properly before activity.  Resuming activity before healing, rehabilitation, and conditioning are complete. PREVENTION   Warm up and stretch properly before activity.  Maintain physical fitness:  Strength, flexibility, and endurance.  Cardiovascular fitness.  Wear and use properly fitted equipment.  Learn and use proper technique and have a coach correct improper technique.  Wear a tennis elbow (counterforce) brace. PROGNOSIS  The course of this condition depends on the degree of the injury. If treated properly, acute cases (symptoms lasting less than 4 weeks) are often resolved in 2 to 6 weeks. Chronic (longer lasting cases) often resolve  in 3 to 6 months, but may require physical therapy. RELATED COMPLICATIONS   Frequently recurring symptoms, resulting in a chronic problem. Properly treating the problem the first time decreases frequency of recurrence.  Chronic inflammation, scarring, and partial tendon tear, requiring surgery.  Delayed healing or resolution of symptoms. TREATMENT  Treatment first involves the use of ice and medicine, to reduce pain and inflammation. Strengthening and stretching exercises may reduce discomfort, if performed regularly. These exercises may be performed at home, if the condition is an acute injury. Chronic cases may require a referral to a physical therapist for evaluation and treatment. Your caregiver may advise a corticosteroid injection to help reduce inflammation. Rarely, surgery is needed. MEDICATION  If pain medicine is needed, nonsteroidal anti-inflammatory medicines (aspirin and ibuprofen), or other minor pain relievers (acetaminophen), are often advised.  Do not take pain medicine for 7 days before surgery.  Prescription pain relievers may be given, if your caregiver thinks they are needed. Use only as directed and only as much as you need.  Corticosteroid injections may be recommended. These injections should be reserved only for the most severe cases, because they can only be given a certain number of times. HEAT AND COLD  Cold treatment (icing) should be applied for 10 to 15 minutes every 2 to 3 hours for inflammation and pain, and immediately after activity that aggravates your symptoms. Use ice packs or an ice massage.  Heat treatment may be used before performing stretching and strengthening activities prescribed by your caregiver, physical therapist, or athletic trainer. Use a heat pack or a warm water soak. SEEK MEDICAL CARE IF: Symptoms get worse or do not improve in 2 weeks, despite treatment. EXERCISES  RANGE OF MOTION (ROM) AND   STRETCHING EXERCISES - Epicondylitis, Medial  (Golfer's Elbow) These exercises may help you when beginning to rehabilitate your injury. Your symptoms may go away with or without further involvement from your physician, physical therapist or athletic trainer. While completing these exercises, remember:   Restoring tissue flexibility helps normal motion to return to the joints. This allows healthier, less painful movement and activity.  An effective stretch should be held for at least 30 seconds.  A stretch should never be painful. You should only feel a gentle lengthening or release in the stretched tissue. RANGE OF MOTION - Wrist Flexion, Active-Assisted  Extend your right / left elbow with your fingers pointing down.*  Gently pull the back of your hand towards you, until you feel a gentle stretch on the top of your forearm.  Hold this position for __________ seconds. Repeat __________ times. Complete this exercise __________ times per day.  *If directed by your physician, physical therapist or athletic trainer, complete this stretch with your elbow bent, rather than extended. RANGE OF MOTION - Wrist Extension, Active-Assisted  Extend your right / left elbow and turn your palm upwards.*  Gently pull your palm and fingertips back, so your wrist extends and your fingers point more toward the ground.  You should feel a gentle stretch on the inside of your forearm.  Hold this position for __________ seconds. Repeat __________ times. Complete this exercise __________ times per day. *If directed by your physician, physical therapist or athletic trainer, complete this stretch with your elbow bent, rather than extended. STRETCH - Wrist Extension   Place your right / left fingertips on a tabletop leaving your elbow slightly bent. Your fingers should point backwards.  Gently press your fingers and palm down onto the table, by straightening your elbow. You should feel a stretch on the inside of your forearm.  Hold this position for  __________ seconds. Repeat __________ times. Complete this stretch __________ times per day.  STRENGTHENING EXERCISES - Epicondylitis, Medial (Golfer's Elbow) These exercises may help you when beginning to rehabilitate your injury. They may resolve your symptoms with or without further involvement from your physician, physical therapist or athletic trainer. While completing these exercises, remember:   Muscles can gain both the endurance and the strength needed for everyday activities through controlled exercises.  Complete these exercises as instructed by your physician, physical therapist or athletic trainer. Increase the resistance and repetitions only as guided.  You may experience muscle soreness or fatigue, but the pain or discomfort you are trying to eliminate should never worsen during these exercises. If this pain does get worse, stop and make sure you are following the directions exactly. If the pain is still present after adjustments, discontinue the exercise until you can discuss the trouble with your caregiver. STRENGTH - Wrist Flexors  Sit with your right / left forearm palm-up, and fully supported on a table or countertop. Your elbow should be resting below the height of your shoulder. Allow your wrist to extend over the edge of the surface.  Loosely holding a __________ weight, or a piece of rubber exercise band or tubing, slowly curl your hand up toward your forearm.  Hold this position for __________ seconds. Slowly lower the wrist back to the starting position in a controlled manner. Repeat __________ times. Complete this exercise __________ times per day.  STRENGTH - Wrist Extensors  Sit with your right / left forearm palm-down and fully supported. Your elbow should be resting below the height of your shoulder. Allow your  wrist to extend over the edge of the surface.  Loosely holding a __________ weight, or a piece of rubber exercise band or tubing, slowly curl your hand up  toward your forearm.  Hold this position for __________ seconds. Slowly lower the wrist back to the starting position in a controlled manner. Repeat __________ times. Complete this exercise __________ times per day.  STRENGTH - Ulnar Deviators  Stand with a ____________________ weight in your right / left hand, or sit while holding a rubber exercise band or tubing, with your healthy arm supported on a table or countertop.  Move your wrist so that your pinkie travels toward your forearm and your thumb moves away from your forearm.  Hold this position for __________ seconds and then slowly lower the wrist back to the starting position. Repeat __________ times. Complete this exercise __________ times per day STRENGTH - Grip   Grasp a tennis ball, a dense sponge, or a large, rolled sock in your hand.  Squeeze as hard as you can, without increasing any pain.  Hold this position for __________ seconds. Release your grip slowly. Repeat __________ times. Complete this exercise __________ times per day.  STRENGTH - Forearm Supinators   Sit with your right / left forearm supported on a table, keeping your elbow below shoulder height. Rest your hand over the edge, palm down.  Gently grip a hammer or a soup ladle.  Without moving your elbow, slowly turn your palm and hand upward to a "thumbs-up" position.  Hold this position for __________ seconds. Slowly return to the starting position. Repeat __________ times. Complete this exercise __________ times per day.  STRENGTH - Forearm Pronators  Sit with your right / left forearm supported on a table, keeping your elbow below shoulder height. Rest your hand over the edge, palm up.  Gently grip a hammer or a soup ladle.  Without moving your elbow, slowly turn your palm and hand upward to a "thumbs-up" position.  Hold this position for __________ seconds. Slowly return to the starting position. Repeat __________ times. Complete this exercise  __________ times per day.    This information is not intended to replace advice given to you by your health care provider. Make sure you discuss any questions you have with your health care provider.   Document Released: 09/21/2005 Document Revised: 10/12/2014 Document Reviewed: 01/03/2009 Elsevier Interactive Patient Education [employer].

## 2016-05-11 NOTE — Progress Notes (Signed)
Location:  [facility] clinic Provider: Keonta Alsip L. Mariea Clonts, D.O., C.M.D.  Code Status: DNR Goals of Care:  Advanced Directives 11/28/2015  Does patient have an advance directive? Yes  Type of Advance Directive Out of facility DNR (pink MOST or yellow form)  Does patient want to make changes to advanced directive? No - Patient declined  Copy of advanced directive(s) in chart? Yes     Chief Complaint  Patient presents with  . Acute Visit    right elbow pain since x4 days    HPI: Patient is a 75 y.o. female seen today for an acute visit for right elbow pain x 4 days. Was golfing, felt it on Friday, then bothered her all weekend.  Sneaks down to hand or up to shoulder sometimes.  Has never had that pain before.  Each time she hit the ball, she felt it.  She did not play Friday or Sat and used cold compresses, ibuprofen.  On Fri night, she felt she might go through the ceiling.    Past Medical History:  Diagnosis Date  . Acute perichondritis of pinna   . Allergic rhinitis due to pollen   . Anxiety state, unspecified   . Disorder of bone and cartilage, unspecified   . Encounter for long-term (current) use of other medications   . Mixed hyperlipidemia   . Unspecified dermatitis due to sun   . Unspecified hypothyroidism     Past Surgical History:  Procedure Laterality Date  . childbirth     x2 "blocks"  . COLONOSCOPY     x2  . TONSILLECTOMY  1949   removed    No Known Allergies    Medication List       Accurate as of 05/11/16 11:38 AM. Always use your most recent med list.          atorvastatin 10 MG tablet Commonly known as:  LIPITOR TAKE 1 TABLET BY MOUTH DAILY FOR CHOLESTEROL   CALCIUM PO Take 1 tablet by mouth daily.   cholecalciferol 1000 units tablet Commonly known as:  VITAMIN D 1,000 Units. Take 2 tablets daily   citalopram 20 MG tablet Commonly known as:  CELEXA Take 10-20 mg by mouth daily.   fish oil-omega-3 fatty acids 1000 MG capsule Take 2 g by mouth  daily. Take one tablet once a day.   fluticasone 50 MCG/ACT nasal spray Commonly known as:  FLONASE PLACE 1 SPRAY INTO BOTH NOSTRILS DAILY.   levothyroxine 50 MCG tablet Commonly known as:  SYNTHROID, LEVOTHROID Take one tablet 30 minutes prior to breakfast for thyroid   LORazepam 0.5 MG tablet Commonly known as:  ATIVAN Take 0.5 mg by mouth as needed.   multivitamin tablet Take 1 tablet by mouth daily.   nystatin cream Commonly known as:  MYCOSTATIN APPLY TO AREA BENEATH BREAST AS NEEDED FOR RASH TWICE A DAY.   triamcinolone cream 0.1 % Commonly known as:  KENALOG       Review of Systems:  Review of Systems  Constitutional: Negative for chills and fever.       Wt gain  Musculoskeletal: Positive for joint pain.       Right elbow  Psychiatric/Behavioral: Negative for depression.    Health Maintenance  Topic Date Due  . INFLUENZA VACCINE  05/05/2016  . MAMMOGRAM  07/02/2017  . TETANUS/TDAP  10/05/2017  . DEXA SCAN  Completed  . ZOSTAVAX  Completed  . PNA vac Low Risk Adult  Completed    Physical Exam: Vitals:  05/11/16 1056  BP: 130/80  Pulse: (!) 59  Temp: 98.4 F (36.9 C)  TempSrc: Oral  SpO2: 95%  Weight: 195 lb (88.5 kg)   Body mass index is 33.47 kg/m. Physical Exam  Constitutional: She is oriented to person, place, and time. She appears well-developed and well-nourished. No distress.  Musculoskeletal: Normal range of motion.  Pain over olecranon process and medial epicondyle with extension of her right arm posteriorly  Neurological: She is alert and oriented to person, place, and time.   Labs reviewed: Basic Metabolic Panel:  Recent Labs  05/20/15 0813 11/25/15 0824  NA 140 140  K 4.8 4.5  CL 102 99  CO2 23 27  GLUCOSE 93 93  BUN 16 22  CREATININE 0.91 0.91  CALCIUM 9.1 9.2  TSH 1.580 1.630   Liver Function Tests:  Recent Labs  05/20/15 0813 11/25/15 0824  AST 18 18  ALT 15 14  ALKPHOS 46 50  BILITOT 0.6 0.6  PROT 5.9*  6.1  ALBUMIN 4.0 4.0   No results for input(s): LIPASE, AMYLASE in the last 8760 hours. No results for input(s): AMMONIA in the last 8760 hours. CBC:  Recent Labs  05/20/15 0819 11/25/15 0824  WBC 4.7 5.1  NEUTROABS 2.5 2.7  HCT 40.7 43.0  MCV 92 92  PLT 227 248   Lipid Panel:  Recent Labs  05/20/15 0813 11/25/15 0824  CHOL 139 159  HDL 56 66  LDLCALC 58 70  TRIG 123 114  CHOLHDL 2.5 2.4   Lab Results  Component Value Date   HGBA1C 5.7 (H) 11/25/2015    Assessment/Plan 1. Golfer's elbow, right -medial epicondylitis--advised rest, ice, short term nsaids for about one week -if not getting better, would prefer she see PT before beginning any exercise program -keep regular appt as scheduled  Labs/tests ordered:  No orders of the defined types were placed in this encounter.  Next appt:  05/25/2016  Florena Kozma L. Arnulfo Batson, D.O. [facility] [address],  [address] Cell Phone (Mon-Fri 8am-5pm):  [phone] On Call:  [phone] & follow prompts after 5pm & weekends Office Phone:  [phone] Office Fax:  [phone]

## 2016-05-22 NOTE — Addendum Note (Signed)
Addended by: Kayson Bullis C on: 05/22/2016 03:07 PM   Modules accepted: Orders  

## 2016-05-25 ENCOUNTER — Other Ambulatory Visit: Payer: Medicare Other

## 2016-05-25 DIAGNOSIS — E039 Hypothyroidism, unspecified: Secondary | ICD-10-CM | POA: Diagnosis not present

## 2016-05-25 DIAGNOSIS — R739 Hyperglycemia, unspecified: Secondary | ICD-10-CM | POA: Diagnosis not present

## 2016-05-25 DIAGNOSIS — E785 Hyperlipidemia, unspecified: Secondary | ICD-10-CM

## 2016-05-25 LAB — BASIC METABOLIC PANEL
BUN: 15 mg/dL (ref 7–25)
CO2: 27 mmol/L (ref 20–31)
Calcium: 9.2 mg/dL (ref 8.6–10.4)
Chloride: 101 mmol/L (ref 98–110)
Creat: 1.07 mg/dL — ABNORMAL HIGH (ref 0.60–0.93)
Glucose, Bld: 98 mg/dL (ref 65–99)
Potassium: 4.5 mmol/L (ref 3.5–5.3)
Sodium: 138 mmol/L (ref 135–146)

## 2016-05-25 LAB — LIPID PANEL
Cholesterol: 131 mg/dL (ref 125–200)
HDL: 65 mg/dL (ref >=46)
LDL Cholesterol: 47 mg/dL (ref <130)
Total CHOL/HDL Ratio: 2 Ratio (ref <=5.0)
Triglycerides: 96 mg/dL (ref <150)
VLDL: 19 mg/dL (ref <30)

## 2016-05-25 LAB — TSH: TSH: 1.47 mIU/L

## 2016-05-26 ENCOUNTER — Encounter: Payer: Self-pay | Admitting: *Deleted

## 2016-05-26 ENCOUNTER — Other Ambulatory Visit: Payer: Self-pay | Admitting: Internal Medicine

## 2016-05-26 LAB — HEMOGLOBIN A1C
Hgb A1c MFr Bld: 5.3 % (ref <5.7)
Mean Plasma Glucose: 105 mg/dL

## 2016-05-28 ENCOUNTER — Encounter: Payer: Self-pay | Admitting: Internal Medicine

## 2016-05-28 ENCOUNTER — Ambulatory Visit (INDEPENDENT_AMBULATORY_CARE_PROVIDER_SITE_OTHER): Payer: Medicare Other | Admitting: Internal Medicine

## 2016-05-28 VITALS — BP 112/70 | HR 69 | Temp 98.0°F | Wt 188.0 lb

## 2016-05-28 DIAGNOSIS — Z23 Encounter for immunization: Secondary | ICD-10-CM

## 2016-05-28 DIAGNOSIS — M7701 Medial epicondylitis, right elbow: Secondary | ICD-10-CM | POA: Diagnosis not present

## 2016-05-28 DIAGNOSIS — E039 Hypothyroidism, unspecified: Secondary | ICD-10-CM

## 2016-05-28 DIAGNOSIS — E785 Hyperlipidemia, unspecified: Secondary | ICD-10-CM | POA: Diagnosis not present

## 2016-05-28 DIAGNOSIS — Z6832 Body mass index (BMI) 32.0-32.9, adult: Secondary | ICD-10-CM

## 2016-05-28 DIAGNOSIS — R739 Hyperglycemia, unspecified: Secondary | ICD-10-CM

## 2016-05-28 NOTE — Progress Notes (Signed)
Location:  [facility] clinic Provider:  Gabrielle Mester L. Mariea Clonts, D.O., C.M.D.  Code Status: DNR Goals of Care:  Advanced Directives 11/28/2015  Does patient have an advance directive? Yes  Type of Advance Directive Out of facility DNR (pink MOST or yellow form)  Does patient want to make changes to advanced directive? No - Patient declined  Copy of advanced directive(s) in chart? Yes   Chief Complaint  Patient presents with  . Medical Management of Chronic Issues    6 mth follow-up    HPI: Patient is a 75 y.o. female seen today for medical management of chronic diseases.    Golfers' elbow:  Elbow is still sore when she pulls up her bra strap.  Has not been playing golf, but still walking with friends and putting.  Says she's going to try to hit a longer club next week.  Also acts up if she is working on her laptop a long time.    Has lost 7 lbs just since 8/7.  Is trying to get more steps on her meter.  Has been eating better and using my fitness pal online.  Eats a lot of fruit and then sugar on the program goes up.    Does hydrate well.  Could not explain cr up to 1.07 from 0.91.  Had taken advil for her elbow.    Past Medical History:  Diagnosis Date  . Acute perichondritis of pinna   . Allergic rhinitis due to pollen   . Anxiety state, unspecified   . Disorder of bone and cartilage, unspecified   . Encounter for long-term (current) use of other medications   . Mixed hyperlipidemia   . Unspecified dermatitis due to sun   . Unspecified hypothyroidism     Past Surgical History:  Procedure Laterality Date  . childbirth     x2 "blocks"  . COLONOSCOPY     x2  . TONSILLECTOMY  1949   removed    No Known Allergies    Medication List       Accurate as of 05/28/16  8:13 AM. Always use your most recent med list.          atorvastatin 10 MG tablet Commonly known as:  LIPITOR TAKE 1 TABLET BY MOUTH DAILY FOR CHOLESTEROL   CALCIUM PO Take 1 tablet by mouth daily.     cholecalciferol 1000 units tablet Commonly known as:  VITAMIN D 1,000 Units. Take 2 tablets daily   citalopram 20 MG tablet Commonly known as:  CELEXA Take 10-20 mg by mouth daily.   fish oil-omega-3 fatty acids 1000 MG capsule Take 2 g by mouth daily. Take one tablet once a day.   fluticasone 50 MCG/ACT nasal spray Commonly known as:  FLONASE PLACE 1 SPRAY INTO BOTH NOSTRILS DAILY.   levothyroxine 50 MCG tablet Commonly known as:  SYNTHROID, LEVOTHROID Take one tablet 30 minutes prior to breakfast for thyroid   LORazepam 0.5 MG tablet Commonly known as:  ATIVAN Take 0.5 mg by mouth as needed.   multivitamin tablet Take 1 tablet by mouth daily.   nystatin cream Commonly known as:  MYCOSTATIN APPLY TO AREA BENEATH BREAST AS NEEDED FOR RASH TWICE A DAY.   triamcinolone cream 0.1 % Commonly known as:  KENALOG       Review of Systems:  Review of Systems  Constitutional: Negative for chills, fever and malaise/fatigue.  HENT: Negative for hearing loss.   Eyes: Negative for blurred vision.  Respiratory: Negative for shortness of  breath.   Cardiovascular: Negative for chest pain and palpitations.  Gastrointestinal: Negative for blood in stool, constipation, melena and nausea.  Genitourinary: Positive for urgency. Negative for dysuria and frequency.  Musculoskeletal: Positive for joint pain. Negative for falls and myalgias.       Right elbow  Skin: Negative for itching and rash.  Neurological: Negative for dizziness and weakness.  Endo/Heme/Allergies: Does not bruise/bleed easily.  Psychiatric/Behavioral: Negative for depression and memory loss. The patient is not nervous/anxious.     Health Maintenance  Topic Date Due  . INFLUENZA VACCINE  05/05/2016  . MAMMOGRAM  07/02/2017  . TETANUS/TDAP  10/05/2017  . DEXA SCAN  Completed  . ZOSTAVAX  Completed  . PNA vac Low Risk Adult  Completed    Physical Exam: Vitals:   05/28/16 0803  BP: 112/70  Pulse: 69  Temp:  98 F (36.7 C)  TempSrc: Oral  SpO2: 94%  Weight: 188 lb (85.3 kg)   Body mass index is 32.27 kg/m. Physical Exam  Constitutional: She is oriented to person, place, and time. She appears well-developed and well-nourished. No distress.  Cardiovascular: Normal rate, regular rhythm, normal heart sounds and intact distal pulses.   Pulmonary/Chest: Effort normal and breath sounds normal. No respiratory distress.  Abdominal: Bowel sounds are normal.  Musculoskeletal: Normal range of motion. She exhibits tenderness.  Of right elbow increased with flexion and lifting bra strap  Neurological: She is alert and oriented to person, place, and time.  Skin: Skin is warm and dry.  Psychiatric: She has a normal mood and affect.    Labs reviewed: Basic Metabolic Panel:  Recent Labs  11/25/15 0824 05/25/16 0829  NA 140 138  K 4.5 4.5  CL 99 101  CO2 27 27  GLUCOSE 93 98  BUN 22 15  CREATININE 0.91 1.07*  CALCIUM 9.2 9.2  TSH 1.630 1.47   Liver Function Tests:  Recent Labs  11/25/15 0824  AST 18  ALT 14  ALKPHOS 50  BILITOT 0.6  PROT 6.1  ALBUMIN 4.0   No results for input(s): LIPASE, AMYLASE in the last 8760 hours. No results for input(s): AMMONIA in the last 8760 hours. CBC:  Recent Labs  11/25/15 0824  WBC 5.1  NEUTROABS 2.7  HCT 43.0  MCV 92  PLT 248   Lipid Panel:  Recent Labs  11/25/15 0824 05/25/16 0829  CHOL 159 131  HDL 66 65  LDLCALC 70 47  TRIG 114 96  CHOLHDL 2.4 2.0   Lab Results  Component Value Date   HGBA1C 5.3 05/25/2016    Assessment/Plan 1. Golfer's elbow, right -some minor ongoing pain and is trying to rest -reports that she is willing to rest from golf for a month or two if needed to recover -discussed medrol dose pak or injection if not improving after another week or two  2. Hypothyroidism, unspecified hypothyroidism type -has been stable with current synthroid, cont to monitor  3. Hyperglycemia - control of glucose has  improved dramatically with dietary changes and walking more - Hemoglobin A1c; Future - Basic metabolic panel; Future  4. Hyperlipidemia with target LDL less than 100 - cont atorvastatin 10mg  po qhs for her cholesterol, also improved with diet and exercise - Lipid panel; Future  5. Need for immunization against influenza - Flu Vaccine QUAD 36+ mos PF IM (Fluarix & Fluzone Quad PF)  6. Body mass index (BMI) of 32.0-32.9 in adult -improving gradually with her weight loss from dietary changes primarily and  some increase in walking  Labs/tests ordered:   Orders Placed This Encounter  Procedures  . Flu Vaccine QUAD 36+ mos PF IM (Fluarix & Fluzone Quad PF)  . Hemoglobin A1c    Standing Status:   Future    Standing Expiration Date:   11/28/2016  . Lipid panel    Standing Status:   Future    Standing Expiration Date:   11/28/2016  . Basic metabolic panel    Standing Status:   Future    Standing Expiration Date:   11/28/2016   Next appt:  6 mos for annual with labs before   [place]. Bear Osten, D.O. [facility] [address], [place] [address] Cell Phone (Mon-Fri 8am-5pm):  [phone] On Call:  [phone] & follow prompts after 5pm & weekends Office Phone:  [phone] Office Fax:  [phone]

## 2016-06-19 ENCOUNTER — Other Ambulatory Visit: Payer: Self-pay | Admitting: Internal Medicine

## 2016-06-19 DIAGNOSIS — H40023 Open angle with borderline findings, high risk, bilateral: Secondary | ICD-10-CM | POA: Diagnosis not present

## 2016-06-19 DIAGNOSIS — Z1231 Encounter for screening mammogram for malignant neoplasm of breast: Secondary | ICD-10-CM

## 2016-06-19 DIAGNOSIS — H25813 Combined forms of age-related cataract, bilateral: Secondary | ICD-10-CM | POA: Diagnosis not present

## 2016-06-19 DIAGNOSIS — H04123 Dry eye syndrome of bilateral lacrimal glands: Secondary | ICD-10-CM | POA: Diagnosis not present

## 2016-07-06 ENCOUNTER — Ambulatory Visit
Admission: RE | Admit: 2016-07-06 | Discharge: 2016-07-06 | Disposition: A | Discharge status: Home / Self Care | Payer: Medicare Other | Source: Ambulatory Visit | Attending: Internal Medicine | Admitting: Internal Medicine

## 2016-07-06 DIAGNOSIS — Z1231 Encounter for screening mammogram for malignant neoplasm of breast: Secondary | ICD-10-CM | POA: Diagnosis not present

## 2016-07-08 ENCOUNTER — Other Ambulatory Visit: Payer: Self-pay | Admitting: Internal Medicine

## 2016-07-08 DIAGNOSIS — R928 Other abnormal and inconclusive findings on diagnostic imaging of breast: Secondary | ICD-10-CM

## 2016-07-13 ENCOUNTER — Other Ambulatory Visit: Payer: Self-pay | Admitting: Internal Medicine

## 2016-07-13 ENCOUNTER — Ambulatory Visit
Admission: RE | Admit: 2016-07-13 | Discharge: 2016-07-13 | Disposition: A | Discharge status: Home / Self Care | Payer: Medicare Other | Source: Ambulatory Visit | Attending: Internal Medicine | Admitting: Internal Medicine

## 2016-07-13 DIAGNOSIS — R928 Other abnormal and inconclusive findings on diagnostic imaging of breast: Secondary | ICD-10-CM

## 2016-07-13 DIAGNOSIS — N6312 Unspecified lump in the right breast, upper inner quadrant: Secondary | ICD-10-CM | POA: Diagnosis not present

## 2016-07-13 DIAGNOSIS — N6310 Unspecified lump in the right breast, unspecified quadrant: Secondary | ICD-10-CM

## 2016-07-14 ENCOUNTER — Ambulatory Visit
Admission: RE | Admit: 2016-07-14 | Discharge: 2016-07-14 | Disposition: A | Discharge status: Home / Self Care | Payer: Medicare Other | Source: Ambulatory Visit | Attending: Internal Medicine | Admitting: Internal Medicine

## 2016-07-14 ENCOUNTER — Other Ambulatory Visit: Payer: Self-pay | Admitting: Internal Medicine

## 2016-07-14 DIAGNOSIS — N6310 Unspecified lump in the right breast, unspecified quadrant: Secondary | ICD-10-CM

## 2016-07-14 DIAGNOSIS — N6312 Unspecified lump in the right breast, upper inner quadrant: Secondary | ICD-10-CM | POA: Diagnosis not present

## 2016-07-14 DIAGNOSIS — N6011 Diffuse cystic mastopathy of right breast: Secondary | ICD-10-CM | POA: Diagnosis not present

## 2016-07-14 HISTORY — PX: BREAST BIOPSY: SHX20

## 2016-07-15 ENCOUNTER — Ambulatory Visit
Admission: RE | Admit: 2016-07-15 | Discharge: 2016-07-15 | Disposition: A | Discharge status: Home / Self Care | Payer: Medicare Other | Source: Ambulatory Visit | Attending: Internal Medicine | Admitting: Internal Medicine

## 2016-07-15 ENCOUNTER — Other Ambulatory Visit: Payer: Self-pay | Admitting: Internal Medicine

## 2016-07-15 DIAGNOSIS — N6310 Unspecified lump in the right breast, unspecified quadrant: Secondary | ICD-10-CM

## 2016-07-15 DIAGNOSIS — N6011 Diffuse cystic mastopathy of right breast: Secondary | ICD-10-CM | POA: Diagnosis not present

## 2016-07-15 DIAGNOSIS — N6489 Other specified disorders of breast: Secondary | ICD-10-CM | POA: Diagnosis not present

## 2016-07-15 HISTORY — PX: BREAST BIOPSY: SHX20

## 2016-07-16 ENCOUNTER — Other Ambulatory Visit: Payer: Medicare Other

## 2016-09-09 DIAGNOSIS — H43393 Other vitreous opacities, bilateral: Secondary | ICD-10-CM | POA: Diagnosis not present

## 2016-09-09 DIAGNOSIS — H2512 Age-related nuclear cataract, left eye: Secondary | ICD-10-CM | POA: Diagnosis not present

## 2016-09-09 DIAGNOSIS — H25043 Posterior subcapsular polar age-related cataract, bilateral: Secondary | ICD-10-CM | POA: Diagnosis not present

## 2016-09-09 DIAGNOSIS — H25013 Cortical age-related cataract, bilateral: Secondary | ICD-10-CM | POA: Diagnosis not present

## 2016-09-09 DIAGNOSIS — H2513 Age-related nuclear cataract, bilateral: Secondary | ICD-10-CM | POA: Diagnosis not present

## 2016-10-08 ENCOUNTER — Other Ambulatory Visit: Payer: Self-pay | Admitting: Internal Medicine

## 2016-10-12 HISTORY — PX: OTHER SURGICAL HISTORY: SHX169

## 2016-10-13 DIAGNOSIS — H2512 Age-related nuclear cataract, left eye: Secondary | ICD-10-CM | POA: Diagnosis not present

## 2016-10-13 DIAGNOSIS — Z961 Presence of intraocular lens: Secondary | ICD-10-CM | POA: Diagnosis not present

## 2016-10-27 DIAGNOSIS — Z961 Presence of intraocular lens: Secondary | ICD-10-CM | POA: Diagnosis not present

## 2016-10-27 DIAGNOSIS — H2511 Age-related nuclear cataract, right eye: Secondary | ICD-10-CM | POA: Diagnosis not present

## 2016-12-10 IMAGING — MR MR SHOULDER*R* W/O CM
4 of 5 series · 19 of 40 positions shown · non-contrast
Comparison: Radiographs dated 04/01/2015

CLINICAL DATA: Shoulder pain.

EXAM:
MRI OF THE RIGHT SHOULDER WITHOUT CONTRAST
TECHNIQUE: Multiplanar, multisequence MR imaging of the shoulder was performed.
No intravenous contrast was administered.

[Series 3: T2 fat-sat · axial · 4.0mm · 0.27mm/px · z∈[-42,+30]mm · 5 of 20 slices shown (1 of 3)]
[im 1/20]
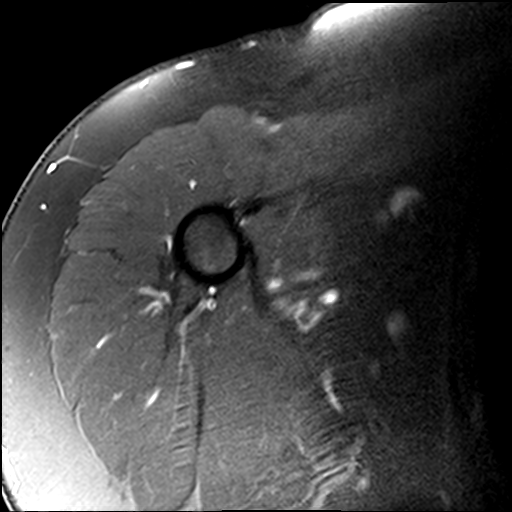
[im 3/20]
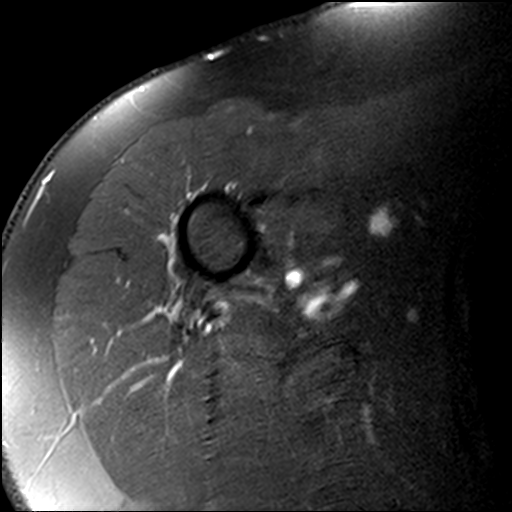
[im 6/20]
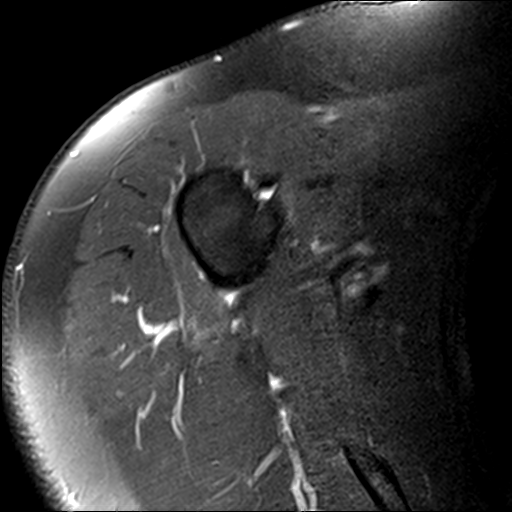
[im 11/20]
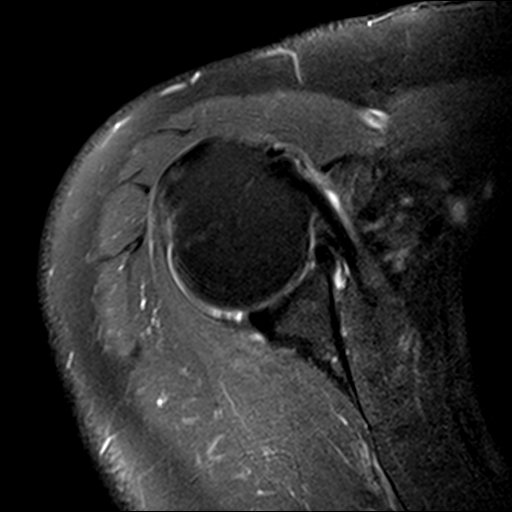
[im 17/20]
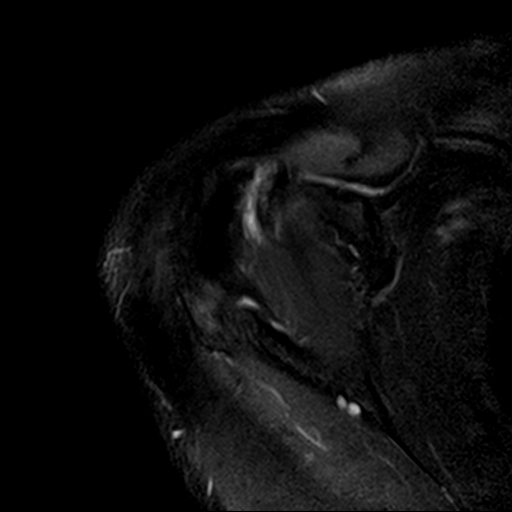

[Series 4: T2 fat-sat · oblique · 4.0mm · 0.31mm/px · 3 of 18 slices shown (2 of 3)]
[im 3/18]
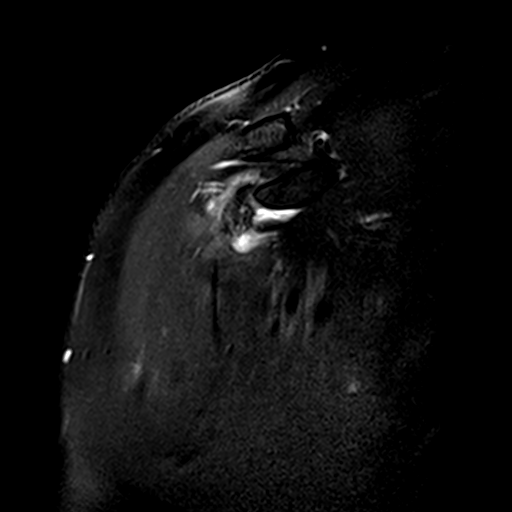
[im 10/18]
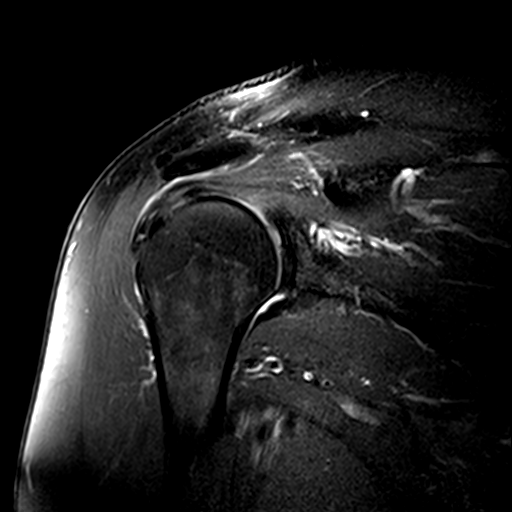
[im 15/18]
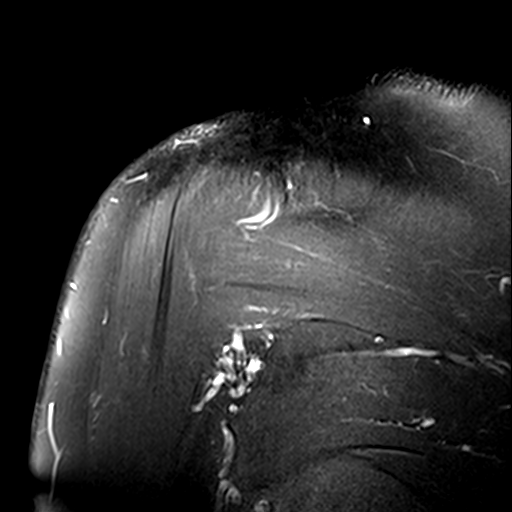

[Series 5: T2 fat-sat · oblique · 4.0mm · 0.31mm/px · 3 of 19 slices shown (3 of 3)]
[im 3/19]
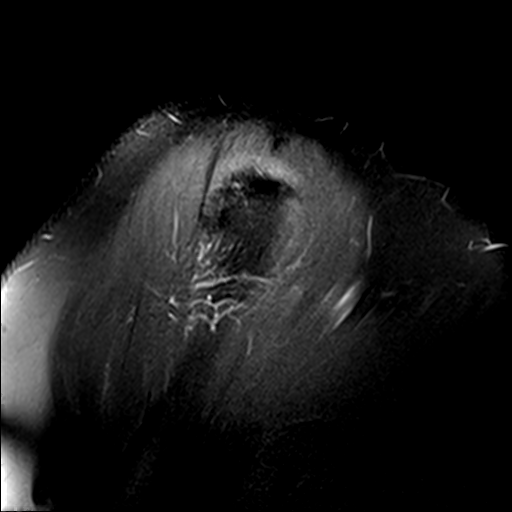
[im 11/19]
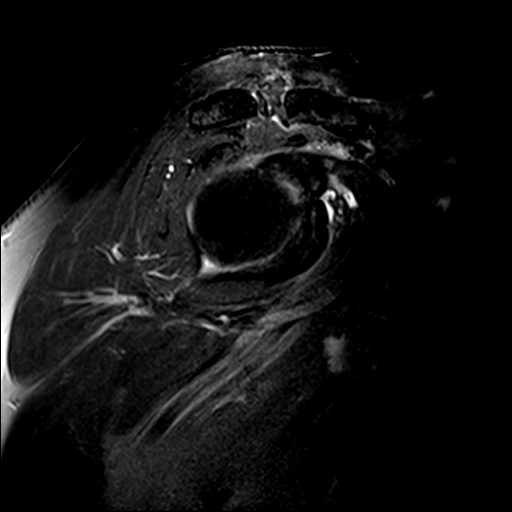
[im 16/19]
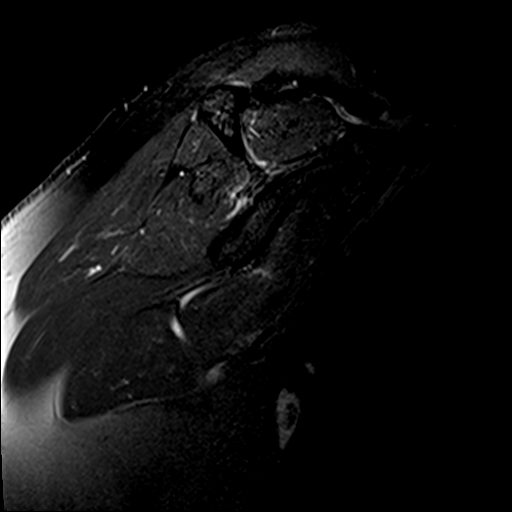

[Series 7: PD · oblique · 4.0mm · 0.27mm/px · 8 of 18 slices shown]
[im 1/18]
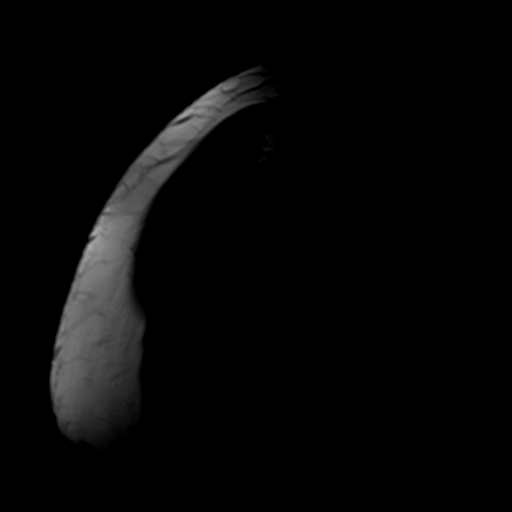
[im 3/18]
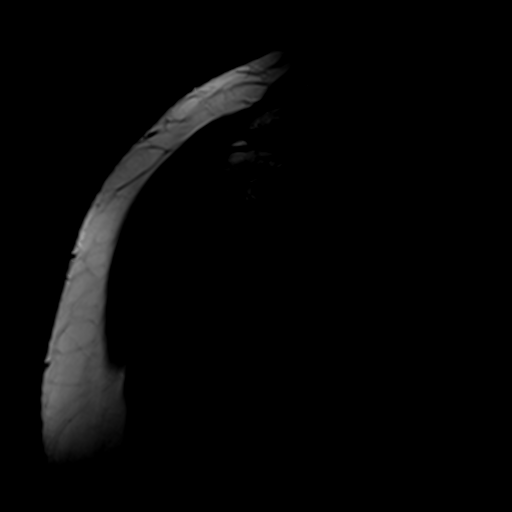
[im 5/18]
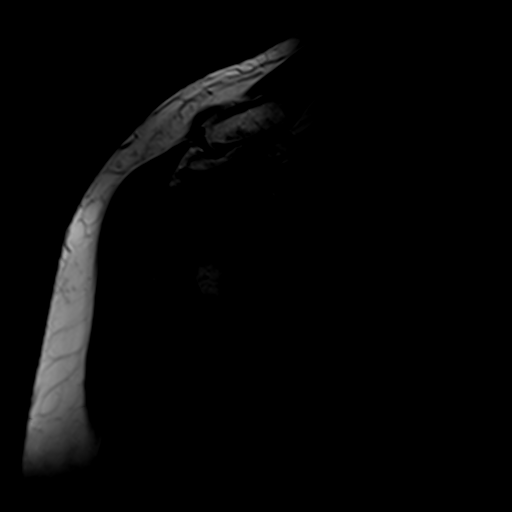
[im 8/18]
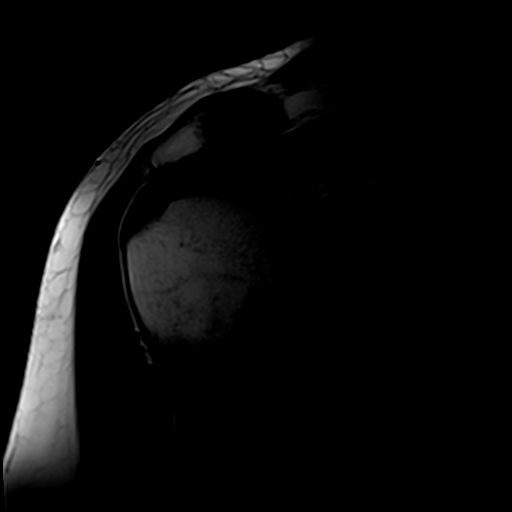
[im 10/18]
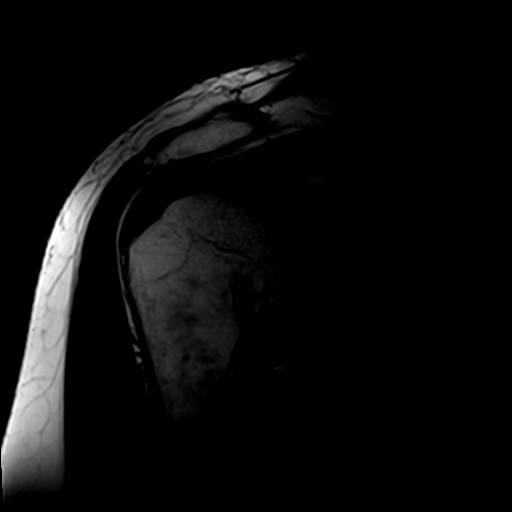
[im 13/18]
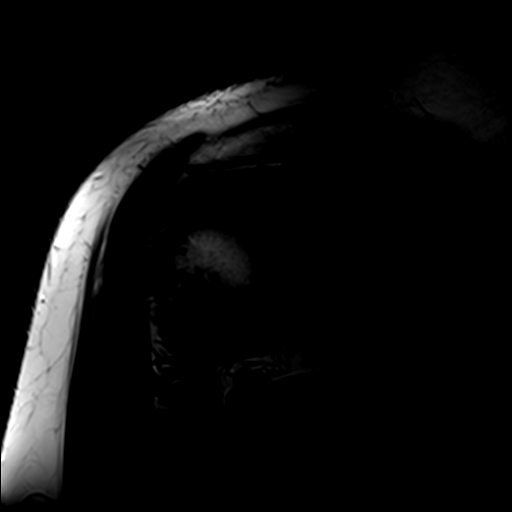
[im 15/18]
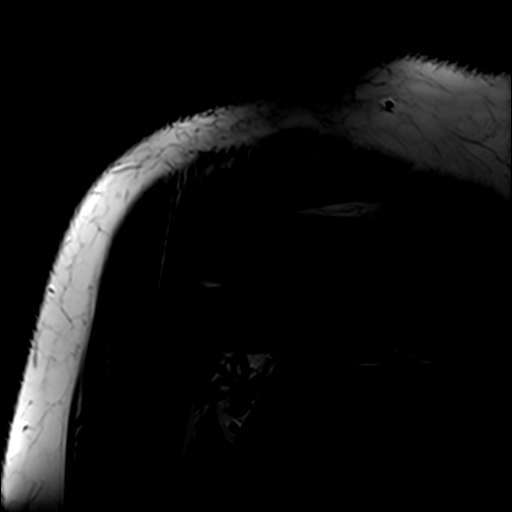
[im 18/18]
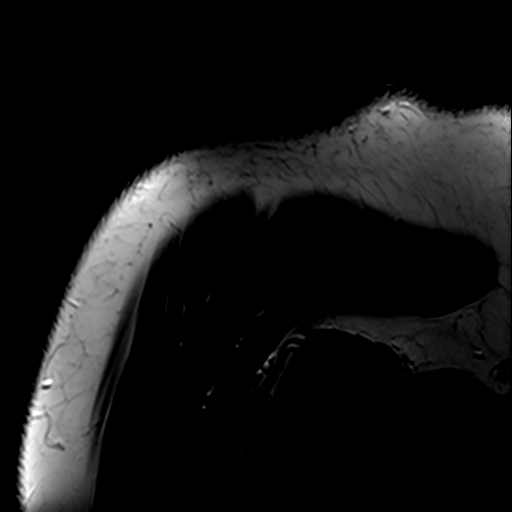

[19 of 40 positions shown; findings below may reference images not displayed]

FINDINGS: Rotator cuff:  Normal.

Muscles:  Normal.

Biceps long head: There appears to be a longitudinal split tear
involving the proximal long head of the biceps tendon at the level
of the superior aspect of the bicipital groove.

Acromioclavicular Joint: Minimal degenerative changes. Type 1
acromion. No bursitis.

Glenohumeral Joint: Diffuse thinning of the articular cartilage. No
joint effusion.

Labrum:  Intact.

Bones: Minimal degenerative changes of the greater tuberosity of the
proximal humerus.
IMPRESSION: Longitudinal split tear of the proximal long head of the biceps
tendon.

Slight arthritis of the glenohumeral joint as indicated by a
cartilage thinning.

Slight arthritic changes at the AC joint.

## 2016-12-14 ENCOUNTER — Other Ambulatory Visit: Payer: Medicare Other

## 2016-12-14 ENCOUNTER — Other Ambulatory Visit: Payer: Self-pay | Admitting: Internal Medicine

## 2016-12-15 ENCOUNTER — Other Ambulatory Visit: Payer: Medicare Other

## 2016-12-15 DIAGNOSIS — E785 Hyperlipidemia, unspecified: Secondary | ICD-10-CM | POA: Diagnosis not present

## 2016-12-15 DIAGNOSIS — R739 Hyperglycemia, unspecified: Secondary | ICD-10-CM | POA: Diagnosis not present

## 2016-12-15 LAB — LIPID PANEL
Cholesterol: 161 mg/dL (ref <200)
HDL: 69 mg/dL (ref >50)
LDL Cholesterol: 70 mg/dL (ref <100)
Total CHOL/HDL Ratio: 2.3 Ratio (ref <5.0)
Triglycerides: 110 mg/dL (ref <150)
VLDL: 22 mg/dL (ref <30)

## 2016-12-15 LAB — BASIC METABOLIC PANEL
BUN: 16 mg/dL (ref 7–25)
CO2: 28 mmol/L (ref 20–31)
Calcium: 9 mg/dL (ref 8.6–10.4)
Chloride: 105 mmol/L (ref 98–110)
Creat: 1.01 mg/dL — ABNORMAL HIGH (ref 0.60–0.93)
Glucose, Bld: 91 mg/dL (ref 65–99)
Potassium: 4.5 mmol/L (ref 3.5–5.3)
Sodium: 140 mmol/L (ref 135–146)

## 2016-12-16 LAB — HEMOGLOBIN A1C
Hgb A1c MFr Bld: 5.1 % (ref <5.7)
Mean Plasma Glucose: 100 mg/dL

## 2016-12-17 ENCOUNTER — Ambulatory Visit (INDEPENDENT_AMBULATORY_CARE_PROVIDER_SITE_OTHER): Payer: Medicare Other | Admitting: Internal Medicine

## 2016-12-17 ENCOUNTER — Encounter: Payer: Self-pay | Admitting: Internal Medicine

## 2016-12-17 ENCOUNTER — Encounter: Payer: Self-pay | Admitting: *Deleted

## 2016-12-17 VITALS — BP 138/78 | HR 65 | Temp 97.9°F | Ht 64.0 in | Wt 185.0 lb

## 2016-12-17 DIAGNOSIS — Z0000 Encounter for general adult medical examination without abnormal findings: Secondary | ICD-10-CM | POA: Diagnosis not present

## 2016-12-17 DIAGNOSIS — F419 Anxiety disorder, unspecified: Secondary | ICD-10-CM

## 2016-12-17 DIAGNOSIS — Z9849 Cataract extraction status, unspecified eye: Secondary | ICD-10-CM | POA: Diagnosis not present

## 2016-12-17 DIAGNOSIS — E785 Hyperlipidemia, unspecified: Secondary | ICD-10-CM

## 2016-12-17 DIAGNOSIS — E669 Obesity, unspecified: Secondary | ICD-10-CM

## 2016-12-17 NOTE — Progress Notes (Signed)
Location:  [facility] clinic Provider: Ranay Ketter L. Mariea Clonts, D.O., C.M.D.  Patient Care Team: Gayland Curry, DO as PCP - General (Geriatric Medicine)  Extended Emergency Contact Information Primary Emergency Contact: Wisnewski,Kenneth Address: [address]          [place], [place] [address] [name] of [place] Phone: [phone] Relation: Spouse  Code Status: DNR Goals of Care: Advanced Directive information Advanced Directives 12/17/2016  Does Patient Have a Medical Advance Directive? Yes  Type of Advance Directive Living will  Does patient want to make changes to medical advance directive? -  Copy of [place] in Chart? -   Chief Complaint  Patient presents with  . Annual Exam    wellness exam  . MMSE    30/30 passed clock    HPI: Patient is a 76 y.o. female seen in today for an annual wellness exam.    Had cataract surgery done and no longer needs any glasses.    Depression screen [facility] 2/9 12/17/2016 05/28/2016 05/11/2016 11/28/2015 06/03/2015  Decreased Interest 0 0 0 0 0  Down, Depressed, Hopeless 0 0 0 0 0  PHQ - 2 Score 0 0 0 0 0    Fall Risk  12/17/2016 05/28/2016 05/11/2016 11/28/2015 06/03/2015  Falls in the past year? No No No No No   MMSE - Mini Mental State Exam 12/17/2016 11/28/2015  Not completed: - (No Data)  Orientation to time 5 5  Orientation to Place 5 5  Registration 3 3  Attention/ Calculation 5 5  Recall 3 2  Language- name 2 objects 2 2  Language- repeat 1 1  Language- follow 3 step command 3 3  Language- read & follow direction 1 1  Write a sentence 1 1  Copy design 1 1  Total score 30 29     Health Maintenance  Topic Date Due  . TETANUS/TDAP  10/05/2017  . MAMMOGRAM  07/14/2018  . INFLUENZA VACCINE  Completed  . DEXA SCAN  Completed  . PNA vac Low Risk Adult  Completed     Functional Status Survey: Is the patient deaf or have difficulty hearing?: No (a touch when using the phone) Does the patient have difficulty  seeing, even when wearing glasses/contacts?: No Does the patient have difficulty concentrating, remembering, or making decisions?: No Does the patient have difficulty walking or climbing stairs?: No Does the patient have difficulty dressing or bathing?: No Does the patient have difficulty doing errands alone such as visiting a doctor's office or shopping?: No Current Exercise Habits: Home exercise routine, Type of exercise: walking, Time (Minutes): 60, Frequency (Times/Week): 5, Weekly Exercise (Minutes/Week): 300, Intensity: Moderate Exercise limited by: None identified  Diet? No exam data present Hearing: Dentition:  Past Medical History:  Diagnosis Date  . Acute perichondritis of pinna   . Allergic rhinitis due to pollen   . Anxiety state, unspecified   . Disorder of bone and cartilage, unspecified   . Encounter for long-term (current) use of other medications   . Mixed hyperlipidemia   . Unspecified dermatitis due to sun   . Unspecified hypothyroidism     Past Surgical History:  Procedure Laterality Date  . childbirth     x2 "blocks"  . COLONOSCOPY     x2  . TONSILLECTOMY  1949   removed    Family History  Problem Relation Age of Onset  . Stroke Mother   . Heart disease Father   . Hyperlipidemia Sister   .  Heart disease Sister   . Thyroid disease Sister   . Heart disease Son   . Thyroid disease Son   . Thyroid disease Daughter   . Colon cancer Neg Hx     Social History   Social History  . Marital status: Married    Spouse name: N/A  . Number of children: N/A  . Years of education: N/A   Social History Main Topics  . Smoking status: Never Smoker  . Smokeless tobacco: Never Used  . Alcohol use 1.2 oz/week    2 Glasses of wine per week  . Drug use: No  . Sexual activity: Yes   Other Topics Concern  . None   Social History Narrative  . None    reports that she has never smoked. She has never used smokeless tobacco. She reports that she drinks  about 1.2 oz of alcohol per week . She reports that she does not use drugs.  No Known Allergies  Allergies as of 12/17/2016   No Known Allergies     Medication List       Accurate as of 12/17/16  9:56 AM. Always use your most recent med list.          atorvastatin 10 MG tablet Commonly known as:  LIPITOR TAKE 1 TABLET BY MOUTH DAILY FOR CHOLESTEROL   CALCIUM PO Take 1 tablet by mouth daily.   cholecalciferol 1000 units tablet Commonly known as:  VITAMIN D 1,000 Units. Take 2 tablets daily   citalopram 20 MG tablet Commonly known as:  CELEXA TAKE 1 TABLET BY MOUTH ONCE DAILY.   fish oil-omega-3 fatty acids 1000 MG capsule Take 2 g by mouth daily. Take one tablet once a day.   fluticasone 50 MCG/ACT nasal spray Commonly known as:  FLONASE PLACE 1 SPRAY INTO BOTH NOSTRILS DAILY.   levothyroxine 50 MCG tablet Commonly known as:  SYNTHROID, LEVOTHROID TAKE 1 TABLET BY MOUTH ONCE DAILY 30 MINUTES PRIOR TO BREAKFAST FOR THYROID   LORazepam 0.5 MG tablet Commonly known as:  ATIVAN Take 0.5 mg by mouth as needed.   multivitamin tablet Take 1 tablet by mouth daily.   nystatin cream Commonly known as:  MYCOSTATIN APPLY TO AREA BENEATH BREAST AS NEEDED FOR RASH TWICE A DAY.   triamcinolone cream 0.1 % Commonly known as:  KENALOG        Review of Systems:  Review of Systems  Constitutional: Negative for chills, fever and malaise/fatigue.  HENT: Negative for congestion and hearing loss.   Eyes: Negative for blurred vision.       S/p cataract surgery  Respiratory: Negative for cough and shortness of breath.   Cardiovascular: Negative for chest pain, palpitations and leg swelling.  Gastrointestinal: Negative for abdominal pain, blood in stool, constipation, diarrhea, heartburn and melena.  Genitourinary: Negative for dysuria and urgency.       Some dripping  Musculoskeletal: Negative for falls, joint pain and myalgias.  Skin: Negative for itching and rash.    Neurological: Negative for dizziness, loss of consciousness and weakness.  Endo/Heme/Allergies: Does not bruise/bleed easily.  Psychiatric/Behavioral: Negative for depression and memory loss. The patient does not have insomnia.     Physical Exam: Vitals:   12/17/16 0910  BP: 138/78  Pulse: 65  Temp: 97.9 F (36.6 C)  TempSrc: Oral  SpO2: 95%  Weight: 185 lb (83.9 kg)  Height: 5\' 4"  (1.626 m)   Body mass index is 31.76 kg/m. Physical Exam  Constitutional: She is oriented  to person, place, and time. She appears well-developed and well-nourished. No distress.  HENT:  Head: Normocephalic and atraumatic.  Right Ear: External ear normal.  Left Ear: External ear normal.  Nose: Nose normal.  Mouth/Throat: Oropharynx is clear and moist. No oropharyngeal exudate.  Eyes: Conjunctivae and EOM are normal. Pupils are equal, round, and reactive to light.  Neck: Normal range of motion. Neck supple. No JVD present.  Cardiovascular: Normal rate, regular rhythm, normal heart sounds and intact distal pulses.   Pulmonary/Chest: Effort normal and breath sounds normal. No respiratory distress. Right breast exhibits no inverted nipple, no mass, no nipple discharge, no skin change and no tenderness. Left breast exhibits no inverted nipple, no mass, no nipple discharge, no skin change and no tenderness.  Abdominal: Soft. Bowel sounds are normal. She exhibits no distension. There is no tenderness.  Musculoskeletal: Normal range of motion.  Lymphadenopathy:    She has no cervical adenopathy.  Neurological: She is alert and oriented to person, place, and time. No cranial nerve deficit.  Skin: Skin is warm and dry.  Psychiatric: She has a normal mood and affect. Her behavior is normal. Judgment and thought content normal.    Labs reviewed: Basic Metabolic Panel:  Recent Labs  05/25/16 0829 12/15/16 0820  NA 138 140  K 4.5 4.5  CL 101 105  CO2 27 28  GLUCOSE 98 91  BUN 15 16  CREATININE 1.07*  1.01*  CALCIUM 9.2 9.0  TSH 1.47  --    Liver Function Tests: No results for input(s): AST, ALT, ALKPHOS, BILITOT, PROT, ALBUMIN in the last 8760 hours. No results for input(s): LIPASE, AMYLASE in the last 8760 hours. No results for input(s): AMMONIA in the last 8760 hours. CBC: No results for input(s): WBC, NEUTROABS, HGB, HCT, MCV, PLT in the last 8760 hours. Lipid Panel:  Recent Labs  05/25/16 0829 12/15/16 0820  CHOL 131 161  HDL 65 69  LDLCALC 47 70  TRIG 96 110  CHOLHDL 2.0 2.3   Lab Results  Component Value Date   HGBA1C 5.1 12/15/2016    EKG performed today:  NSR at 63bpm  Assessment/Plan 1. Medicare annual wellness visit, subsequent Performed today  2. Annual physical exam Performed today  3. Hyperlipidemia with target LDL less than 100 - lipid panel satisfactory and improved from past with diet from [place] and lipitor - EKG 12-Lead  4. Anxiety -ongoing, cont celexa and some ativan when needed   5. Status post cataract extraction, unspecified laterality -seeing very well with one near and one far eye  6. Obesity (BMI 30.0-34.9) -stable weight--seems to fluctuate 5 lbs around 180  Labs/tests ordered:  No new, will determine at appt if we need more (tsh possibly)  Next appt:  6 mos med mgt  Keagen Heinlen L. Cheridan Kibler, D.O. [facility] [address], [place] [address] Cell Phone (Mon-Fri 8am-5pm):  [phone] On Call:  [phone] & follow prompts after 5pm & weekends Office Phone:  [phone] Office Fax:  [phone]

## 2017-02-02 ENCOUNTER — Other Ambulatory Visit: Payer: Self-pay | Admitting: Internal Medicine

## 2017-02-02 NOTE — Telephone Encounter (Signed)
rx sent to pharmacy by e-script  

## 2017-02-25 ENCOUNTER — Other Ambulatory Visit: Payer: Self-pay | Admitting: Internal Medicine

## 2017-03-02 ENCOUNTER — Telehealth: Payer: Self-pay | Admitting: *Deleted

## 2017-03-02 MED ORDER — AZITHROMYCIN 250 MG PO TABS: ORAL_TABLET | ORAL | 0 refills | Status: DC

## 2017-03-02 NOTE — Addendum Note (Signed)
Addended by: Despina Hidden on: 03/02/2017 12:03 PM   Modules accepted: Orders

## 2017-03-02 NOTE — Telephone Encounter (Signed)
Patient called and stated that she has allergies with head/chest congestion, hacking cough, Sore Throat,  No fever. Coughing up greenish mucus. Has tried Mucinex and Tylenol and Sudafed and Theraflu with no relief. No available appointments till Monday. Patient wants to know if there is something else that she can take. Please Advise.

## 2017-03-02 NOTE — Telephone Encounter (Signed)
Appointment Scheduled. Patient aware.

## 2017-03-02 NOTE — Telephone Encounter (Signed)
I recommend she come see Janett Billow Thursday.  Unfortunately, I don't have an opening and it sounds like she needs antibiotics.

## 2017-03-04 ENCOUNTER — Encounter (INDEPENDENT_AMBULATORY_CARE_PROVIDER_SITE_OTHER): Payer: Self-pay

## 2017-03-04 ENCOUNTER — Encounter: Payer: Self-pay | Admitting: Nurse Practitioner

## 2017-03-04 ENCOUNTER — Ambulatory Visit (INDEPENDENT_AMBULATORY_CARE_PROVIDER_SITE_OTHER): Payer: Medicare Other | Admitting: Nurse Practitioner

## 2017-03-04 VITALS — BP 132/82 | HR 74 | Temp 98.0°F | Resp 18 | Ht 64.0 in | Wt 189.4 lb

## 2017-03-04 DIAGNOSIS — J0140 Acute pansinusitis, unspecified: Secondary | ICD-10-CM | POA: Diagnosis not present

## 2017-03-04 NOTE — Patient Instructions (Signed)
To take Claritin (loratidine) 10 mg daily for allergies  nettipot twice daily as needed

## 2017-03-04 NOTE — Progress Notes (Signed)
Careteam: Patient Care Team: Gayland Curry, DO as PCP - General (Geriatric Medicine)  Advanced Directive information Does Patient Have a Medical Advance Directive?: Yes, Type of Advance Directive: Living will  No Known Allergies  Chief Complaint  Patient presents with  . Acute Visit    Pt is being seen for cough/congestion x several weeks. Sore throat began 5 days ago. Pt has been taking zithromax for 2 days.     HPI: Patient is a 76 y.o. female seen in the office today due to cough and congestion. Mucous, cough and congestion for at least 2 weeks. Was started on Zithromax on 29th when symptoms began to worsen. She has taken 2 days of medication and now sore throat is gone and congestion in her throat is gone. Still having post nasal drip.   Review of Systems:  Review of Systems  Constitutional: Negative for chills, fever and malaise/fatigue.  HENT: Positive for congestion. Negative for hearing loss, sinus pain and sore throat.   Eyes: Negative for blurred vision.       S/p cataract surgery  Respiratory: Positive for cough (productive). Negative for shortness of breath and wheezing.   Cardiovascular: Negative for chest pain and palpitations.  Gastrointestinal: Negative for diarrhea, nausea and vomiting.  Genitourinary: Negative for dysuria.       Some dripping  Neurological: Negative for dizziness, loss of consciousness, weakness and headaches.  Endo/Heme/Allergies: Does not bruise/bleed easily.    Past Medical History:  Diagnosis Date  . Acute perichondritis of pinna   . Allergic rhinitis due to pollen   . Anxiety state, unspecified   . Disorder of bone and cartilage, unspecified   . Encounter for long-term (current) use of other medications   . Mixed hyperlipidemia   . Unspecified dermatitis due to sun   . Unspecified hypothyroidism    Past Surgical History:  Procedure Laterality Date  . cataract surgery Bilateral 10/12/2016  . childbirth     x2 "blocks"  .  COLONOSCOPY     x2  . TONSILLECTOMY  1949   removed   Social History:   reports that she has never smoked. She has never used smokeless tobacco. She reports that she drinks about 1.2 oz of alcohol per week . She reports that she does not use drugs.  Family History  Problem Relation Age of Onset  . Stroke Mother   . Heart disease Father   . Hyperlipidemia Sister   . Heart disease Sister   . Thyroid disease Sister   . Heart disease Son   . Thyroid disease Son   . Thyroid disease Daughter   . Colon cancer Neg Hx     Medications: Patient's Medications  New Prescriptions   No medications on file  Previous Medications   ATORVASTATIN (LIPITOR) 10 MG TABLET    TAKE 1 TABLET BY MOUTH DAILY FOR CHOLESTEROL   AZITHROMYCIN (ZITHROMAX) 250 MG TABLET    Day 1 take 2 tablets by mouth once, then after 1 tablet daily   CALCIUM PO    Take 1 tablet by mouth daily.   CHOLECALCIFEROL (VITAMIN D) 1000 UNITS TABLET    1,000 Units. Take 2 tablets daily   CITALOPRAM (CELEXA) 20 MG TABLET    TAKE 1 TABLET BY MOUTH ONCE DAILY.   FISH OIL-OMEGA-3 FATTY ACIDS 1000 MG CAPSULE    Take 2 g by mouth daily. Take one tablet once a day.   FLUTICASONE (FLONASE) 50 MCG/ACT NASAL SPRAY    PLACE  1 SPRAY INTO BOTH NOSTRILS DAILY.   LEVOTHYROXINE (SYNTHROID, LEVOTHROID) 50 MCG TABLET    TAKE 1 TABLET BY MOUTH ONCE DAILY 30 MINUTES PRIOR TO BREAKFAST FOR THYROID   LORAZEPAM (ATIVAN) 0.5 MG TABLET    Take 0.5 mg by mouth as needed.    MULTIPLE VITAMIN (MULTIVITAMIN) TABLET    Take 1 tablet by mouth daily.    NYSTATIN CREAM (MYCOSTATIN)    APPLY TO AREA BENEATH BREAST AS NEEDED FOR RASH TWICE A DAY.   TRIAMCINOLONE CREAM (KENALOG) 0.1 %      Modified Medications   No medications on file  Discontinued Medications   No medications on file     Physical Exam:  Vitals:   03/04/17 0958  BP: 132/82  Pulse: 74  Resp: 18  Temp: 98 F (36.7 C)  TempSrc: Oral  SpO2: 96%  Weight: 189 lb 6.4 oz (85.9 kg)  Height: 5'  4" (1.626 m)   Body mass index is 32.51 kg/m.  Physical Exam  Constitutional: She is oriented to person, place, and time. She appears well-developed and well-nourished. No distress.  HENT:  Head: Normocephalic and atraumatic.  Right Ear: External ear normal.  Left Ear: External ear normal.  Nose: Nose normal.  Mouth/Throat: Oropharynx is clear and moist. No oropharyngeal exudate.  Eyes: Conjunctivae and EOM are normal. Pupils are equal, round, and reactive to light.  Neck: Normal range of motion. Neck supple.  Cardiovascular: Normal rate, regular rhythm and normal heart sounds.   Pulmonary/Chest: Effort normal and breath sounds normal. No respiratory distress.  Neurological: She is alert and oriented to person, place, and time.  Skin: Skin is warm and dry.  Psychiatric: She has a normal mood and affect. Her behavior is normal. Judgment and thought content normal.    Labs reviewed: Basic Metabolic Panel:  Recent Labs  05/25/16 0829 12/15/16 0820  NA 138 140  K 4.5 4.5  CL 101 105  CO2 27 28  GLUCOSE 98 91  BUN 15 16  CREATININE 1.07* 1.01*  CALCIUM 9.2 9.0  TSH 1.47  --    Liver Function Tests: No results for input(s): AST, ALT, ALKPHOS, BILITOT, PROT, ALBUMIN in the last 8760 hours. No results for input(s): LIPASE, AMYLASE in the last 8760 hours. No results for input(s): AMMONIA in the last 8760 hours. CBC: No results for input(s): WBC, NEUTROABS, HGB, HCT, MCV, PLT in the last 8760 hours. Lipid Panel:  Recent Labs  05/25/16 0829 12/15/16 0820  CHOL 131 161  HDL 65 69  LDLCALC 47 70  TRIG 96 110  CHOLHDL 2.0 2.3   TSH:  Recent Labs  05/25/16 0829  TSH 1.47   A1C: Lab Results  Component Value Date   HGBA1C 5.1 12/15/2016     Assessment/Plan 1. Acute non-recurrent pansinusitis Doing much better on azithromycin. To cont full course of antibiotics.  -may use loratadine 10 mg daily for allergic rhinitis symptoms  Selestino Nila K. Harle Battiest  [facility] [phone] 8 am - 5 pm) [phone] (after hours)

## 2017-03-24 DIAGNOSIS — L814 Other melanin hyperpigmentation: Secondary | ICD-10-CM | POA: Diagnosis not present

## 2017-03-24 DIAGNOSIS — L570 Actinic keratosis: Secondary | ICD-10-CM | POA: Diagnosis not present

## 2017-03-24 DIAGNOSIS — L821 Other seborrheic keratosis: Secondary | ICD-10-CM | POA: Diagnosis not present

## 2017-03-24 DIAGNOSIS — D1801 Hemangioma of skin and subcutaneous tissue: Secondary | ICD-10-CM | POA: Diagnosis not present

## 2017-04-02 ENCOUNTER — Emergency Department (HOSPITAL_COMMUNITY)
Admission: EM | Admit: 2017-04-02 | Discharge: 2017-04-02 | Disposition: A | Discharge status: Home / Self Care | Payer: Medicare Other | Attending: Emergency Medicine | Admitting: Emergency Medicine

## 2017-04-02 ENCOUNTER — Ambulatory Visit: Payer: Self-pay | Admitting: Nurse Practitioner

## 2017-04-02 ENCOUNTER — Emergency Department (HOSPITAL_COMMUNITY): Payer: Medicare Other

## 2017-04-02 ENCOUNTER — Encounter (HOSPITAL_COMMUNITY): Payer: Self-pay | Admitting: *Deleted

## 2017-04-02 DIAGNOSIS — R531 Weakness: Secondary | ICD-10-CM | POA: Diagnosis present

## 2017-04-02 DIAGNOSIS — E860 Dehydration: Secondary | ICD-10-CM | POA: Diagnosis not present

## 2017-04-02 DIAGNOSIS — E782 Mixed hyperlipidemia: Secondary | ICD-10-CM | POA: Insufficient documentation

## 2017-04-02 DIAGNOSIS — E039 Hypothyroidism, unspecified: Secondary | ICD-10-CM | POA: Diagnosis not present

## 2017-04-02 DIAGNOSIS — R51 Headache: Secondary | ICD-10-CM | POA: Diagnosis not present

## 2017-04-02 DIAGNOSIS — M6281 Muscle weakness (generalized): Secondary | ICD-10-CM | POA: Diagnosis not present

## 2017-04-02 DIAGNOSIS — Z79899 Other long term (current) drug therapy: Secondary | ICD-10-CM | POA: Insufficient documentation

## 2017-04-02 DIAGNOSIS — R42 Dizziness and giddiness: Secondary | ICD-10-CM | POA: Diagnosis not present

## 2017-04-02 DIAGNOSIS — R404 Transient alteration of awareness: Secondary | ICD-10-CM | POA: Diagnosis not present

## 2017-04-02 DIAGNOSIS — F419 Anxiety disorder, unspecified: Secondary | ICD-10-CM | POA: Insufficient documentation

## 2017-04-02 LAB — COMPREHENSIVE METABOLIC PANEL
ALBUMIN: 3.9 g/dL (ref 3.5–5.0)
ALT: 16 U/L (ref 14–54)
AST: 26 U/L (ref 15–41)
Alkaline Phosphatase: 51 U/L (ref 38–126)
Anion gap: 9 (ref 5–15)
BUN: 13 mg/dL (ref 6–20)
CHLORIDE: 107 mmol/L (ref 101–111)
CO2: 24 mmol/L (ref 22–32)
CREATININE: 1.06 mg/dL — AB (ref 0.44–1.00)
Calcium: 9 mg/dL (ref 8.9–10.3)
GFR calc Af Amer: 58 mL/min — ABNORMAL LOW (ref >60)
GFR, EST NON AFRICAN AMERICAN: 50 mL/min — AB (ref >60)
Glucose, Bld: 104 mg/dL — ABNORMAL HIGH (ref 65–99)
POTASSIUM: 4 mmol/L (ref 3.5–5.1)
SODIUM: 140 mmol/L (ref 135–145)
Total Bilirubin: 0.7 mg/dL (ref 0.3–1.2)
Total Protein: 6.5 g/dL (ref 6.5–8.1)

## 2017-04-02 LAB — URINALYSIS, ROUTINE W REFLEX MICROSCOPIC
BILIRUBIN URINE: NEGATIVE
Glucose, UA: NEGATIVE mg/dL
Hgb urine dipstick: NEGATIVE
KETONES UR: NEGATIVE mg/dL
LEUKOCYTES UA: NEGATIVE
NITRITE: NEGATIVE
PH: 7 (ref 5.0–8.0)
PROTEIN: NEGATIVE mg/dL
Specific Gravity, Urine: 1.008 (ref 1.005–1.030)

## 2017-04-02 LAB — CBC WITH DIFFERENTIAL/PLATELET
BASOS ABS: 0.1 10*3/uL (ref 0.0–0.1)
BASOS PCT: 1 %
EOS ABS: 0.2 10*3/uL (ref 0.0–0.7)
EOS PCT: 2 %
HCT: 42.1 % (ref 36.0–46.0)
Hemoglobin: 13.9 g/dL (ref 12.0–15.0)
LYMPHS PCT: 18 %
Lymphs Abs: 1.4 10*3/uL (ref 0.7–4.0)
MCH: 31 pg (ref 26.0–34.0)
MCHC: 33 g/dL (ref 30.0–36.0)
MCV: 93.8 fL (ref 78.0–100.0)
MONO ABS: 0.5 10*3/uL (ref 0.1–1.0)
Monocytes Relative: 6 %
Neutro Abs: 6 10*3/uL (ref 1.7–7.7)
Neutrophils Relative %: 73 %
PLATELETS: 246 10*3/uL (ref 150–400)
RBC: 4.49 MIL/uL (ref 3.87–5.11)
RDW: 13.9 % (ref 11.5–15.5)
WBC: 8.1 10*3/uL (ref 4.0–10.5)

## 2017-04-02 LAB — I-STAT TROPONIN, ED
TROPONIN I, POC: 0 ng/mL (ref 0.00–0.08)
Troponin i, poc: 0.01 ng/mL (ref 0.00–0.08)

## 2017-04-02 MED ORDER — SODIUM CHLORIDE 0.9 % IV BOLUS (SEPSIS)
1000.0000 mL | Freq: Once | INTRAVENOUS | Status: AC
  Administered 2017-04-02: 1000 mL via INTRAVENOUS

## 2017-04-02 NOTE — ED Triage Notes (Signed)
Pt arrives from home via [facility]. Pt hasn't been drinking a lot of water, was in long sleeves and pants and was out in her yard pulling weeds in [place]. Pt states she began to feel extremely dizzy, sweaty and had one episode of diarrhea. Pt states she has had postural dizziness as well as positive orthostatic changes with EMS.  Pt has 250 mL NS PTA.

## 2017-04-02 NOTE — Discharge Instructions (Signed)
Read the information below.  You may return to the Emergency Department at any time for worsening condition or any new symptoms that concern you.   Please rest and drinking plenty of fluids today.    Follow up with your primary care provider and make an appointment with the cardiologists listed on this page.    If you develop worsening chest pain, shortness of breath, fever, weakness or numbness in your extremities, you pass out, or become weak or dizzy, return to the ER for a recheck.

## 2017-04-02 NOTE — ED Provider Notes (Signed)
[facility] DEPT Provider Note   CSN: [id number] Arrival date & time: 04/02/17  1058     History   Chief Complaint Chief Complaint  Patient presents with  . Dehydration  . Dizziness    HPI Patricia Cantu is a 76 y.o. female.  HPI   Pt p/w episode of right sided weakness, lightheadedness after working outside trimming hedges.  States she was outside in long sleeves and pants trimming hedges with the hedgetrimmer in her right hand.  She worked for 40 minutes in the front yard, was sweating profusely, came inside and had a large watery bowel movement.  She then worked doing the same in the backyard for 20 minutes, had another episode of feeling fatigued, another large watery bowel movement, then felt generally weak, particularly in her right arm and right torso.  States it felt like there was a weight on her right shoulder.  This occurred around 10:30.  She states she feels much better now.  Denies CP, SOB, cough, abdominal pain, dizziness (spinning), or near syncope.    Father died of MI in his 72s.  Mother lived into her 43s.    Past Medical History:  Diagnosis Date  . Acute perichondritis of pinna   . Allergic rhinitis due to pollen   . Anxiety state, unspecified   . Disorder of bone and cartilage, unspecified   . Encounter for long-term (current) use of other medications   . Mixed hyperlipidemia   . Unspecified dermatitis due to sun   . Unspecified hypothyroidism     Patient Active Problem List   Diagnosis Date Noted  . Insomnia 11/23/2013  . Heat causing syncope 02/20/2013  . Premature atrial complexes 02/20/2013  . Osteoarthritis of both knees 01/02/2013  . Hyperlipidemia with target LDL less than 100 01/02/2013  . Unspecified vitamin D deficiency 01/02/2013  . Unspecified hypothyroidism 01/02/2013  . Depressive disorder, not elsewhere classified 01/02/2013    Past Surgical History:  Procedure Laterality Date  . cataract surgery Bilateral 10/12/2016  .  childbirth     x2 "blocks"  . COLONOSCOPY     x2  . TONSILLECTOMY  1949   removed    OB History    No data available       Home Medications    Prior to Admission medications   Medication Sig Start Date End Date Taking? Authorizing Provider  atorvastatin (LIPITOR) 10 MG tablet TAKE 1 TABLET BY MOUTH DAILY FOR CHOLESTEROL 02/25/17  Yes Reed, Tiffany L, DO  CALCIUM PO Take 1 tablet by mouth daily.   Yes [provider]  cholecalciferol (VITAMIN D) 1000 UNITS tablet Take 2,000 Units by mouth daily. Take 2 tablets daily   Yes [provider]  citalopram (CELEXA) 20 MG tablet TAKE 1 TABLET BY MOUTH ONCE DAILY. Patient taking differently: take 10mg  by mouth once daily 02/02/17  Yes Reed, Tiffany L, DO  fish oil-omega-3 fatty acids 1000 MG capsule Take 2 g by mouth daily. Take one tablet once a day.   Yes [provider]  fluticasone (FLONASE) 50 MCG/ACT nasal spray PLACE 1 SPRAY INTO BOTH NOSTRILS DAILY. Patient taking differently: PLACE 1 SPRAY INTO BOTH NOSTRILS DAILY AS NEEDED FOR ALLERGIES 08/19/15  Yes Reed, Tiffany L, DO  ibuprofen (ADVIL,MOTRIN) 200 MG tablet Take 200 mg by mouth every 6 (six) hours as needed for moderate pain.   Yes [provider]  levothyroxine (SYNTHROID, LEVOTHROID) 50 MCG tablet TAKE 1 TABLET BY MOUTH ONCE DAILY 30 MINUTES PRIOR TO  BREAKFAST FOR THYROID 12/14/16  Yes Reed, Tiffany L, DO  loratadine (CLARITIN) 10 MG tablet Take 10 mg by mouth daily.   Yes [provider]  Multiple Vitamin (MULTIVITAMIN) tablet Take 1 tablet by mouth daily.    Yes [provider]  nystatin cream (MYCOSTATIN) APPLY TO AREA BENEATH BREAST AS NEEDED FOR RASH TWICE A DAY. Patient taking differently: APPLY TO AREA BENEATH BREAST AS NEEDED FOR RASH TWICE A DAY AS NEEDED FOR RASH 02/12/15  Yes Reed, Tiffany L, DO  triamcinolone cream (KENALOG) 0.1 % Apply 1 application topically daily as needed (itching).  03/24/16  Yes [provider]  azithromycin (ZITHROMAX) 250 MG tablet Day 1 take 2 tablets by mouth once, then after 1 tablet daily Patient not taking: Reported on 04/02/2017 03/02/17   Gayland Curry, DO    Family History Family History  Problem Relation Age of Onset  . Stroke Mother   . Heart disease Father   . Hyperlipidemia Sister   . Heart disease Sister   . Thyroid disease Sister   . Heart disease Son   . Thyroid disease Son   . Thyroid disease Daughter   . Colon cancer Neg Hx     Social History Social History  Substance Use Topics  . Smoking status: Never Smoker  . Smokeless tobacco: Never Used  . Alcohol use 1.2 oz/week    2 Glasses of wine per week     Allergies   Patient has no known allergies.   Review of Systems Review of Systems  All other systems reviewed and are negative.    Physical Exam Updated Vital Signs BP 132/82   Pulse 64   Temp 98.3 F (36.8 C) (Oral)   Resp 12   SpO2 95%   Physical Exam  Constitutional: She appears well-developed and well-nourished. No distress.  HENT:  Head: Normocephalic and atraumatic.  Neck: Neck supple.  Cardiovascular: Normal rate and regular rhythm.   Pulmonary/Chest: Effort normal and breath sounds normal. No respiratory distress. She has no wheezes. She has no rales.  Abdominal: Soft. She exhibits no distension. There is no tenderness. There is no rebound and no guarding.  Musculoskeletal: She exhibits no edema.  Neurological: She is alert.  CN II-XII intact, EOMs intact, no pronator drift, grip strengths equal bilaterally; strength 5/5 in all extremities, sensation intact in all extremities; finger to nose, heel to shin, rapid alternating movements normal.     Skin: She is not diaphoretic.  Nursing note and vitals reviewed.    ED Treatments / Results  Labs (all labs ordered are listed, but only abnormal results are displayed) Labs Reviewed  COMPREHENSIVE METABOLIC PANEL - Abnormal; Notable for the following:        Result Value   Glucose, Bld 104 (*)    Creatinine, Ser 1.06 (*)    GFR calc non Af Amer 50 (*)    GFR calc Af Amer 58 (*)    All other components within normal limits  URINALYSIS, ROUTINE W REFLEX MICROSCOPIC  CBC WITH DIFFERENTIAL/PLATELET  CBC WITH DIFFERENTIAL/PLATELET  I-STAT TROPOININ, ED  I-STAT TROPOININ, ED    EKG  EKG Interpretation  Date/Time:  Friday April 02 2017 15:43:35 EDT Ventricular Rate:  62 PR Interval:    QRS Duration: 76 QT Interval:  451 QTC Calculation: 458 R Axis:   58 Text Interpretation:  Sinus rhythm Low voltage, precordial leads No STEMI.  Confirmed by Nanda Quinton [phone]) on 04/02/2017 4:39:41 PM  Radiology Dg Chest 2 View  Result Date: 04/02/2017 CLINICAL DATA:  Right arm heaviness today.  Lightheadedness. EXAM: CHEST  2 VIEW COMPARISON:  None. FINDINGS: The heart size and mediastinal contours are within normal limits. Both lungs are clear. The visualized skeletal structures are unremarkable. IMPRESSION: No active cardiopulmonary disease. Electronically Signed   By: Lorriane Shire M.D.   On: 04/02/2017 12:47   Ct Head Wo Contrast  Result Date: 04/02/2017 CLINICAL DATA:  Right arm weakness and dizziness EXAM: CT HEAD WITHOUT CONTRAST TECHNIQUE: Contiguous axial images were obtained from the base of the skull through the vertex without intravenous contrast. COMPARISON:  None. FINDINGS: Brain: No evidence of acute infarction, hemorrhage, hydrocephalus, extra-axial collection or mass lesion/mass effect. Vascular: No hyperdense vessel or unexpected calcification. Skull: Normal. Negative for fracture or focal lesion. Sinuses/Orbits: No acute finding. Other: None. IMPRESSION: No acute abnormality noted. Electronically Signed   By: Inez Catalina M.D.   On: 04/02/2017 12:21    Procedures Procedures (including critical care time)  Medications Ordered in ED Medications  sodium chloride 0.9 % bolus 1,000 mL (0 mLs Intravenous Stopped 04/02/17 1546)      Initial Impression / Assessment and Plan / ED Course  I have reviewed the triage vital signs and the nursing notes.  Pertinent labs & imaging results that were available during my care of the patient were reviewed by me and considered in my medical decision making (see chart for details).    Afebrile, nontoxic patient with generalized fatigue but heaviness in the right arm and posterior shoulder after doing yard work in the heat using a [employer] in her right hand for approximately 1 hour.  Had little to drink and had two large watery bowel movements.  I suspect this is related to heat and dehydration, right arm heaviness/weakness may be related to muscle fatigue from using machine in right arm.  Workup reassuring.  No weakness on exam.  Doubt acute stroke.  Cardiac workup also reassuring, delta troponin negative.  Pt given IVF felt much better without symptoms throughout ED stay.  Pt also seen and examined by Dr Laverta Baltimore, discussed workup and plan with him.   D/C home with PCP, cardiology follow up.  Discussed result, findings, treatment, and follow up  with patient.  Pt given return precautions.  Pt verbalizes understanding and agrees with plan.       Final Clinical Impressions(s) / ED Diagnoses   Final diagnoses:  Dehydration  Generalized weakness    New Prescriptions Discharge Medication List as of 04/02/2017  4:16 PM       Clayton Bibles, Hershal Coria 04/02/17 Sheralyn Boatman, MD 04/02/17 2015

## 2017-04-02 NOTE — ED Notes (Signed)
Patient transported to CT 

## 2017-04-02 NOTE — ED Notes (Signed)
Pt ambulatory to the restroom without difficulty.

## 2017-04-12 ENCOUNTER — Ambulatory Visit (INDEPENDENT_AMBULATORY_CARE_PROVIDER_SITE_OTHER): Payer: Medicare Other | Admitting: Internal Medicine

## 2017-04-12 ENCOUNTER — Encounter: Payer: Self-pay | Admitting: Internal Medicine

## 2017-04-12 VITALS — BP 120/80 | HR 73 | Temp 98.1°F | Wt 192.0 lb

## 2017-04-12 DIAGNOSIS — Z8249 Family history of ischemic heart disease and other diseases of the circulatory system: Secondary | ICD-10-CM | POA: Insufficient documentation

## 2017-04-12 DIAGNOSIS — E860 Dehydration: Secondary | ICD-10-CM | POA: Diagnosis not present

## 2017-04-12 DIAGNOSIS — R29898 Other symptoms and signs involving the musculoskeletal system: Secondary | ICD-10-CM | POA: Insufficient documentation

## 2017-04-12 LAB — BASIC METABOLIC PANEL
BUN: 21 mg/dL (ref 7–25)
CO2: 26 mmol/L (ref 20–31)
Calcium: 8.8 mg/dL (ref 8.6–10.4)
Chloride: 103 mmol/L (ref 98–110)
Creat: 1.2 mg/dL — ABNORMAL HIGH (ref 0.60–0.93)
Glucose, Bld: 92 mg/dL (ref 65–99)
Potassium: 4.1 mmol/L (ref 3.5–5.3)
Sodium: 138 mmol/L (ref 135–146)

## 2017-04-12 NOTE — Progress Notes (Signed)
Location:  [facility] clinic Provider: Lebert Lovern L. Mariea Clonts, D.O., C.M.D.  Code Status: DNR, need copy of living will and hcpoa Goals of Care:  Advanced Directives 04/02/2017  Does Patient Have a Medical Advance Directive? No  Type of Advance Directive -  Does patient want to make changes to medical advance directive? -  Copy of [place] in Chart? -   Chief Complaint  Patient presents with  . Acute Visit    ED follow-up, weakness, dehydration    HPI: Patient is a 76 y.o. female seen today for an acute visit for ED f/u for dehydration, weakness.    She felt like her right side was weak and had pressure on it.  Troponin was negative.  Creatinine was elevated.  She was not dizzy.  She had been outside cleaning up the hedges and there was poison ivy so she was out for 1.5 hrs in long pants.  EKG was negative for any changes with NSR, low voltage, precordial leads, no STEMI.  It's recommended she have a stress test with cardiology.  Her daughter thought she was dehydrated.  Was using hedge trimmer with right arm.    Past Medical History:  Diagnosis Date  . Acute perichondritis of pinna   . Allergic rhinitis due to pollen   . Anxiety state, unspecified   . Disorder of bone and cartilage, unspecified   . Encounter for long-term (current) use of other medications   . Mixed hyperlipidemia   . Unspecified dermatitis due to sun   . Unspecified hypothyroidism     Past Surgical History:  Procedure Laterality Date  . cataract surgery Bilateral 10/12/2016  . childbirth     x2 "blocks"  . COLONOSCOPY     x2  . TONSILLECTOMY  1949   removed    No Known Allergies  Allergies as of 04/12/2017   No Known Allergies     Medication List       Accurate as of 04/12/17  3:57 PM. Always use your most recent med list.          atorvastatin 10 MG tablet Commonly known as:  LIPITOR TAKE 1 TABLET BY MOUTH DAILY FOR CHOLESTEROL   CALCIUM PO Take 1 tablet by mouth daily.     cholecalciferol 1000 units tablet Commonly known as:  VITAMIN D Take 2,000 Units by mouth daily. Take 2 tablets daily   citalopram 10 MG tablet Commonly known as:  CELEXA Take 10 mg by mouth daily.   fish oil-omega-3 fatty acids 1000 MG capsule Take 2 g by mouth daily. Take one tablet once a day.   fluticasone 50 MCG/ACT nasal spray Commonly known as:  FLONASE Place 1 spray into both nostrils daily.   ibuprofen 200 MG tablet Commonly known as:  ADVIL,MOTRIN Take 200 mg by mouth every 6 (six) hours as needed for moderate pain.   levothyroxine 50 MCG tablet Commonly known as:  SYNTHROID, LEVOTHROID TAKE 1 TABLET BY MOUTH ONCE DAILY 30 MINUTES PRIOR TO BREAKFAST FOR THYROID   loratadine 10 MG tablet Commonly known as:  CLARITIN Take 10 mg by mouth daily.   multivitamin tablet Take 1 tablet by mouth daily.   nystatin cream Commonly known as:  MYCOSTATIN Apply 1 application topically 2 (two) times daily. Under breast   triamcinolone cream 0.1 % Commonly known as:  KENALOG Apply 1 application topically daily as needed (itching).       Review of Systems:  Review of Systems  Constitutional: Negative  for chills, fever and malaise/fatigue.  HENT: Negative for hearing loss.   Eyes: Negative for blurred vision.  Respiratory: Negative for shortness of breath.   Cardiovascular: Negative for chest pain, palpitations and leg swelling.  Gastrointestinal: Negative for abdominal pain, blood in stool, constipation and melena.  Genitourinary: Negative for dysuria.  Musculoskeletal: Positive for back pain. Negative for falls, joint pain and myalgias.  Skin: Negative for itching and rash.  Neurological: Negative for dizziness and weakness.  Endo/Heme/Allergies: Does not bruise/bleed easily.  Psychiatric/Behavioral: Negative for depression and memory loss. The patient is nervous/anxious.     Health Maintenance  Topic Date Due  . INFLUENZA VACCINE  05/05/2017  . TETANUS/TDAP   10/05/2017  . MAMMOGRAM  07/14/2018  . DEXA SCAN  Completed  . PNA vac Low Risk Adult  Completed    Physical Exam: Vitals:   04/12/17 1536  BP: 120/80  Pulse: 73  Temp: 98.1 F (36.7 C)  TempSrc: Oral  SpO2: 98%  Weight: 192 lb (87.1 kg)   Body mass index is 32.96 kg/m. Physical Exam  Constitutional: She is oriented to person, place, and time. She appears well-developed and well-nourished. No distress.  Cardiovascular: Normal rate, regular rhythm, normal heart sounds and intact distal pulses.   Pulmonary/Chest: Effort normal and breath sounds normal. No respiratory distress.  Abdominal: Bowel sounds are normal.  Musculoskeletal: Normal range of motion.  Neurological: She is alert and oriented to person, place, and time.  Skin: Skin is warm and dry. Capillary refill takes less than 2 seconds.  Psychiatric: She has a normal mood and affect.    Labs reviewed: Basic Metabolic Panel:  Recent Labs  05/25/16 0829 12/15/16 0820 04/02/17 1142  NA 138 140 140  K 4.5 4.5 4.0  CL 101 105 107  CO2 27 28 24   GLUCOSE 98 91 104*  BUN 15 16 13   CREATININE 1.07* 1.01* 1.06*  CALCIUM 9.2 9.0 9.0  TSH 1.47  --   --    Liver Function Tests:  Recent Labs  04/02/17 1142  AST 26  ALT 16  ALKPHOS 51  BILITOT 0.7  PROT 6.5  ALBUMIN 3.9   No results for input(s): LIPASE, AMYLASE in the last 8760 hours. No results for input(s): AMMONIA in the last 8760 hours. CBC:  Recent Labs  04/02/17 1142  WBC 8.1  NEUTROABS 6.0  HGB 13.9  HCT 42.1  MCV 93.8  PLT 246   Lipid Panel:  Recent Labs  05/25/16 0829 12/15/16 0820  CHOL 131 161  HDL 65 69  LDLCALC 47 70  TRIG 96 110  CHOLHDL 2.0 2.3   Lab Results  Component Value Date   HGBA1C 5.1 12/15/2016    Procedures since last visit: Dg Chest 2 View  Result Date: 04/02/2017 CLINICAL DATA:  Right arm heaviness today.  Lightheadedness. EXAM: CHEST  2 VIEW COMPARISON:  None. FINDINGS: The heart size and mediastinal  contours are within normal limits. Both lungs are clear. The visualized skeletal structures are unremarkable. IMPRESSION: No active cardiopulmonary disease. Electronically Signed   By: Lorriane Shire M.D.   On: 04/02/2017 12:47   Ct Head Wo Contrast  Result Date: 04/02/2017 CLINICAL DATA:  Right arm weakness and dizziness EXAM: CT HEAD WITHOUT CONTRAST TECHNIQUE: Contiguous axial images were obtained from the base of the skull through the vertex without intravenous contrast. COMPARISON:  None. FINDINGS: Brain: No evidence of acute infarction, hemorrhage, hydrocephalus, extra-axial collection or mass lesion/mass effect. Vascular: No hyperdense vessel or unexpected  calcification. Skull: Normal. Negative for fracture or focal lesion. Sinuses/Orbits: No acute finding. Other: None. IMPRESSION: No acute abnormality noted. Electronically Signed   By: Inez Catalina M.D.   On: 04/02/2017 12:21    Assessment/Plan 1. Dehydration -f/u bmp after fluids from ED and several days of recovery after working outside all covered up in intense heat -educated on hydration extensively - Basic metabolic panel  2. Weakness of right arm -was transient, no deficits at ED, also had discomfort in that arm/shoulder area, but no other pains like chest pain, abdominal pain, no palpitations or dyspnea and negative cardiac workup in ED, but due to women presenting unusually with cardiac concerns, obesity, age, family history, will refer for stress testing to cardiology--reports she did have this in the past but it's been at least 5-6 years b/c she's been seeing me and we have not had a reason to do this - Ambulatory referral to Cardiology -my suspicion is that the feeling in her arm was from using the hedge trimmer making her arm shake--she has someone else helping to finish the hedges thankfully  3. Family history of MI (myocardial infarction) -father, see above  Labs/tests ordered:   Orders Placed This Encounter  Procedures    . Basic metabolic panel    Order Specific Question:   Has the patient fasted?    Answer:   Yes  . Ambulatory referral to Cardiology    Referral Priority:   Routine    Referral Type:   Consultation    Referral Reason:   Specialty Services Required    Requested Specialty:   Cardiology    Number of Visits Requested:   1    Next appt:  06/21/2017  Ollie Esty L. Tyanna Hach, D.O. [facility] [address], [place] [address] Cell Phone (Mon-Fri 8am-5pm):  [phone] On Call:  [phone] & follow prompts after 5pm & weekends Office Phone:  [phone] Office Fax:  [phone]

## 2017-04-13 DIAGNOSIS — E860 Dehydration: Secondary | ICD-10-CM | POA: Diagnosis not present

## 2017-04-16 ENCOUNTER — Telehealth: Payer: Self-pay

## 2017-04-16 ENCOUNTER — Other Ambulatory Visit: Payer: Self-pay | Admitting: Internal Medicine

## 2017-04-16 DIAGNOSIS — Z1231 Encounter for screening mammogram for malignant neoplasm of breast: Secondary | ICD-10-CM

## 2017-04-16 DIAGNOSIS — N289 Disorder of kidney and ureter, unspecified: Secondary | ICD-10-CM

## 2017-04-16 NOTE — Telephone Encounter (Signed)
Spoke with patient, patient will try tylenol. Scheduled appointment for next Friday for labs. Order placed for BMP

## 2017-04-16 NOTE — Telephone Encounter (Signed)
-----   Message from Gayland Curry, DO sent at 04/15/2017  4:50 PM EDT ----- Would prefer she try using tylenol instead of ibuprofen to see if she gets relief and reassess bmp in another week.  It should not be from allergy medication.

## 2017-04-23 ENCOUNTER — Other Ambulatory Visit: Payer: Medicare Other

## 2017-04-23 DIAGNOSIS — N289 Disorder of kidney and ureter, unspecified: Secondary | ICD-10-CM | POA: Diagnosis not present

## 2017-04-23 LAB — BASIC METABOLIC PANEL
BUN: 19 mg/dL (ref 7–25)
CO2: 24 mmol/L (ref 20–31)
Calcium: 8.7 mg/dL (ref 8.6–10.4)
Chloride: 103 mmol/L (ref 98–110)
Creat: 1.01 mg/dL — ABNORMAL HIGH (ref 0.60–0.93)
Glucose, Bld: 90 mg/dL (ref 65–99)
Potassium: 4.3 mmol/L (ref 3.5–5.3)
Sodium: 138 mmol/L (ref 135–146)

## 2017-04-26 ENCOUNTER — Encounter: Payer: Self-pay | Admitting: *Deleted

## 2017-05-12 ENCOUNTER — Encounter: Payer: Self-pay | Admitting: Physician Assistant

## 2017-05-12 ENCOUNTER — Ambulatory Visit (INDEPENDENT_AMBULATORY_CARE_PROVIDER_SITE_OTHER): Payer: Medicare Other | Admitting: Physician Assistant

## 2017-05-12 VITALS — BP 132/62 | HR 58 | Ht 65.0 in | Wt 189.0 lb

## 2017-05-12 DIAGNOSIS — M79601 Pain in right arm: Secondary | ICD-10-CM

## 2017-05-12 DIAGNOSIS — R55 Syncope and collapse: Secondary | ICD-10-CM

## 2017-05-12 DIAGNOSIS — E785 Hyperlipidemia, unspecified: Secondary | ICD-10-CM

## 2017-05-12 NOTE — Progress Notes (Signed)
Cardiology Office Note:    Date:  05/12/2017   ID:  Patricia Cantu, DOB 15-Mar-1941, MRN [id number]  PCP:  Gayland Curry, DO  Cardiologist:  New - Dr. Sherren Mocha    Referring MD: Gayland Curry, DO   Chief Complaint  Patient presents with  . Near Syncope  . Arm Pain    History of Present Illness:    Patricia Cantu is a 76 y.o. female with a hx of hyperlipidemia, panic disorder, hypothyroidism who is being seen today for the evaluation of near syncope and right arm pain at the request of Gayland Curry, DO.   Patricia Cantu went to the emergency room June 29 after working outside for 1-2 hours. Of note, she was wearing long sleeves and long pants due to a recent history of poison ivy. She was hedging bushes and using her right arm. She suddenly felt near syncopal and asked her husband to take her to the emergency room. Her right shoulder felt uncomfortable for about 20 minutes. She describes it as a pressure. It was noted that she had stopped sweating. Records indicate that she had negative troponins and an unremarkable ECG. Her creatinine was somewhat elevated and she was given IV fluids. She had one other episode of near syncope about 2 years ago. She had been playing golf and while walking back to her car, she felt as though she may pass out. That episode was much less severe compared to her most recent episode. She denies any chest discomfort or exertional dyspnea. She denies orthopnea, PND or edema. She denies any significant palpitations. She denies syncope.  PAD Screen 05/12/2017  Previous PAD dx? No  Previous surgical procedure? No  Pain with walking? No  Feet/toe relief with dangling? No  Painful, non-healing ulcers? No  Extremities discolored? No    Prior CV studies:   The following studies were reviewed today:  None   Past Medical History:  Diagnosis Date  . Acute perichondritis of pinna   . Allergic rhinitis due to pollen   . Disorder of bone and cartilage, unspecified   .  Encounter for long-term (current) use of other medications   . HLD (hyperlipidemia)   . Osteoarthritis   . Panic disorder   . Unspecified dermatitis due to sun   . Unspecified hypothyroidism     Past Surgical History:  Procedure Laterality Date  . cataract surgery Bilateral 10/12/2016  . childbirth     x2 "blocks"  . COLONOSCOPY     x2  . TONSILLECTOMY  1949   removed    Current Medications: Current Meds  Medication Sig  . atorvastatin (LIPITOR) 10 MG tablet TAKE 1 TABLET BY MOUTH DAILY FOR CHOLESTEROL  . CALCIUM PO Take 1 tablet by mouth daily.  . cholecalciferol (VITAMIN D) 1000 UNITS tablet Take 2,000 Units by mouth daily. Take 2 tablets daily  . citalopram (CELEXA) 10 MG tablet Take 10 mg by mouth daily.  . fish oil-omega-3 fatty acids 1000 MG capsule Take 2 g by mouth daily. Take one tablet once a day.  . fluticasone (FLONASE) 50 MCG/ACT nasal spray Place 1 spray into both nostrils daily.  Marland Kitchen ibuprofen (ADVIL,MOTRIN) 200 MG tablet Take 200 mg by mouth every 6 (six) hours as needed for moderate pain.  Marland Kitchen levothyroxine (SYNTHROID, LEVOTHROID) 50 MCG tablet TAKE 1 TABLET BY MOUTH ONCE DAILY 30 MINUTES PRIOR TO BREAKFAST FOR THYROID  . loratadine (CLARITIN) 10 MG tablet Take 10 mg by mouth daily.  Marland Kitchen  Multiple Vitamin (MULTIVITAMIN) tablet Take 1 tablet by mouth daily.   Marland Kitchen nystatin cream (MYCOSTATIN) Apply 1 application topically 2 (two) times daily. Under breast  . triamcinolone cream (KENALOG) 0.1 % Apply 1 application topically daily as needed (itching).      Allergies:   Patient has no known allergies.   Social History   Social History  . Marital status: Married    Spouse name: N/A  . Number of children: N/A  . Years of education: N/A   Occupational History  . retired    Social History Main Topics  . Smoking status: Never Smoker  . Smokeless tobacco: Never Used  . Alcohol use 1.2 oz/week    2 Glasses of wine per week  . Drug use: No  . Sexual activity: Yes    Other Topics Concern  . None   Social History Narrative   She is retired. 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 She is married and has 3 children and 6 grandchildren. She is originally from [place]. She has lived in [place]. She lived in [place] for 7 years. She denies tobacco abuse. She drinks alcohol on occasion.     Family Hx: The patient's family history includes Atrial fibrillation in her brother and sister; Heart attack (age of onset: 67) in her father; Heart disease in her son; Heart failure (age of onset: 51) in her mother; Hyperlipidemia in her sister; Hypertension in her mother; Stroke in her mother; Thyroid disease in her daughter, sister, and son. There is no history of Colon cancer.  ROS:   Please see the history of present illness.    ROS All other systems reviewed and are negative.    EKGs/Labs/Other Test Reviewed:    EKG:  EKG is  ordered today.  The ekg ordered today demonstrates Sinus bradycardia, HR 58, normal axis, QTC 424 ms  Recent Labs: 05/25/2016: TSH 1.47 04/02/2017: ALT 16; Hemoglobin 13.9; Platelets 246 04/23/2017: BUN 19; Creat 1.01; Potassium 4.3; Sodium 138   Recent Lipid Panel Lab Results  Component Value Date/Time   CHOL 161 12/15/2016 08:20 AM   CHOL 159 11/25/2015 08:24 AM   TRIG 110 12/15/2016 08:20 AM   HDL 69 12/15/2016 08:20 AM   HDL 66 11/25/2015 08:24 AM   CHOLHDL 2.3 12/15/2016 08:20 AM   LDLCALC 70 12/15/2016 08:20 AM   LDLCALC 70 11/25/2015 08:24 AM    Physical Exam:    VS:  BP 132/62   Pulse (!) 58   Ht 5\' 5"  (1.651 m)   Wt 189 lb (85.7 kg)   BMI 31.45 kg/m     Wt Readings from Last 3 Encounters:  05/12/17 189 lb (85.7 kg)  04/12/17 192 lb (87.1 kg)  03/04/17 189 lb 6.4 oz (85.9 kg)     Physical Exam  Constitutional: She is oriented to person, place, and time. She appears well-developed and well-nourished. No distress.  HENT:  Head: Normocephalic  and atraumatic.  Eyes: No scleral icterus.  Neck: Normal range of motion. No JVD present. Carotid bruit is not present.  Cardiovascular: Normal rate, regular rhythm, S1 normal and S2 normal.   Murmur heard.  Early systolic murmur is present with a grade of 1/6  at the upper right sternal border Pulses:      Dorsalis pedis pulses are 2+ on the right side, and 2+ on the left side.       Posterior tibial pulses  are 2+ on the right side, and 2+ on the left side.  Pulmonary/Chest: Breath sounds normal. She has no wheezes. She has no rhonchi. She has no rales.  Abdominal: Soft. There is no tenderness.  Musculoskeletal: She exhibits no edema.  Neurological: She is alert and oriented to person, place, and time.  Skin: Skin is warm and dry.  Psychiatric: She has a normal mood and affect.    ASSESSMENT:    1. Near syncope   2. Right arm pain   3. Hyperlipidemia, unspecified hyperlipidemia type    PLAN:    In order of problems listed above:  1. Near syncope -  She likely describes symptoms of dehydration. However, this is her second episode of near syncope. She does have what sounds like aortic sclerosis on exam. Echocardiogram will be obtained.  2. Right arm pain -  She has risk factors of family history, hyperlipidemia, age, gender. Her symptoms are atypical for ischemia. She has not had a stress test in many years. I have recommended proceeding with a plain exercise treadmill test.  3. Hyperlipidemia - Follow-up with primary care.   Dispo:  Return as needed or if testing is abnormal .   Medication Adjustments/Labs and Tests Ordered: Current medicines are reviewed at length with the patient today.  Concerns regarding medicines are outlined above.  Orders/Tests:  Orders Placed This Encounter  Procedures  . Exercise Tolerance Test  . EKG 12-Lead  . ECHOCARDIOGRAM COMPLETE   Medication changes: No orders of the defined types were placed in this encounter.  Signed, Richardson Dopp, PA-C  05/12/2017 5:36 PM    [facility] [address], [place], [place]  [address] Phone: [phone]; Fax: [phone]
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
## 2017-05-19 ENCOUNTER — Other Ambulatory Visit: Payer: Self-pay | Admitting: Internal Medicine

## 2017-05-20 ENCOUNTER — Other Ambulatory Visit: Payer: Self-pay | Admitting: *Deleted

## 2017-05-20 MED ORDER — CITALOPRAM HYDROBROMIDE 10 MG PO TABS: 10.0000 mg | ORAL_TABLET | Freq: Every day | ORAL | 3 refills | Status: DC

## 2017-05-20 NOTE — Telephone Encounter (Signed)
[facility] 

## 2017-06-05 DIAGNOSIS — Z9289 Personal history of other medical treatment: Secondary | ICD-10-CM

## 2017-06-05 HISTORY — DX: Personal history of other medical treatment: Z92.89

## 2017-06-17 ENCOUNTER — Telehealth: Payer: Self-pay | Admitting: Physician Assistant
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
## 2017-06-17 NOTE — Telephone Encounter (Signed)
New message    Pt is calling with questions about her medications before her test tomorrow. Please call.

## 2017-06-18 ENCOUNTER — Ambulatory Visit (INDEPENDENT_AMBULATORY_CARE_PROVIDER_SITE_OTHER): Payer: Medicare Other

## 2017-06-18 ENCOUNTER — Ambulatory Visit (HOSPITAL_COMMUNITY): Payer: Medicare Other | Attending: Cardiovascular Disease

## 2017-06-18 ENCOUNTER — Other Ambulatory Visit: Payer: Self-pay

## 2017-06-18 DIAGNOSIS — E785 Hyperlipidemia, unspecified: Secondary | ICD-10-CM | POA: Insufficient documentation

## 2017-06-18 DIAGNOSIS — R55 Syncope and collapse: Secondary | ICD-10-CM | POA: Diagnosis not present

## 2017-06-18 DIAGNOSIS — M79601 Pain in right arm: Secondary | ICD-10-CM

## 2017-06-18 DIAGNOSIS — Z8249 Family history of ischemic heart disease and other diseases of the circulatory system: Secondary | ICD-10-CM | POA: Insufficient documentation

## 2017-06-18 LAB — EXERCISE TOLERANCE TEST
CHL CUP MPHR: 146 {beats}/min
CHL CUP RESTING HR STRESS: 65 {beats}/min
CHL RATE OF PERCEIVED EXERTION: 18
CSEPEDS: 45 s
CSEPPHR: 162 {beats}/min
Estimated workload: 8.1 METS
Exercise duration (min): 6 min
Percent HR: 110 %

## 2017-06-20 ENCOUNTER — Encounter: Payer: Self-pay | Admitting: Physician Assistant

## 2017-06-21 ENCOUNTER — Ambulatory Visit (INDEPENDENT_AMBULATORY_CARE_PROVIDER_SITE_OTHER): Payer: Medicare Other | Admitting: Internal Medicine

## 2017-06-21 ENCOUNTER — Encounter: Payer: Self-pay | Admitting: Internal Medicine

## 2017-06-21 ENCOUNTER — Telehealth: Payer: Self-pay | Admitting: Physician Assistant

## 2017-06-21 VITALS — BP 138/80 | HR 62 | Temp 97.7°F | Wt 186.0 lb

## 2017-06-21 DIAGNOSIS — R739 Hyperglycemia, unspecified: Secondary | ICD-10-CM

## 2017-06-21 DIAGNOSIS — E039 Hypothyroidism, unspecified: Secondary | ICD-10-CM | POA: Diagnosis not present

## 2017-06-21 DIAGNOSIS — G4700 Insomnia, unspecified: Secondary | ICD-10-CM | POA: Diagnosis not present

## 2017-06-21 DIAGNOSIS — E785 Hyperlipidemia, unspecified: Secondary | ICD-10-CM

## 2017-06-21 DIAGNOSIS — R29898 Other symptoms and signs involving the musculoskeletal system: Secondary | ICD-10-CM

## 2017-06-21 DIAGNOSIS — Z23 Encounter for immunization: Secondary | ICD-10-CM

## 2017-06-21 NOTE — Progress Notes (Signed)
Location:   [place]   Place of Service:   Clinic   Provider: Rivan Siordia L. Mariea Clonts, D.O., C.M.D.  Code Status: DNR Goals of Care:  Advanced Directives 06/21/2017  Does Patient Have a Medical Advance Directive? Yes  Type of Advance Directive Living will  Does patient want to make changes to medical advance directive? -  Copy of [place] in Chart? -     Chief Complaint  Patient presents with  . Medical Management of Chronic Issues    32mth follow-up    HPI: Patient is a 76 y.o. female seen today for medical management of chronic diseases.   Hypertension-States that she has been taking her SBP at home and has been running around 130. Had an episode at the cardiologist on Friday with SBP 210 but had no symptoms.  Syncope-Has had no more episodes. Had an Echo on Friday with cardiology.   Tried to hydrate during her cruise.    Elbow pain/weakness- Has been doing well with no complaints. Has not played golf lately but is doing well.  Hasn't golfed for over 3 weeks, but didn't bother her just before her cruise (3 wks) either.    Weight loss- Wants to check thyroid levels. Has maintained her weight despite going on a cruise. Wants to lose.     Past Medical History:  Diagnosis Date  . Acute perichondritis of pinna   . Allergic rhinitis due to pollen   . Disorder of bone and cartilage, unspecified   . Encounter for long-term (current) use of other medications   . History of echocardiogram    Echo 9/18: EF 65-70  . History of exercise stress test 06/2017   ETT 9/18: no ischemia; hypertensive BP response  . HLD (hyperlipidemia)   . Osteoarthritis   . Panic disorder   . Unspecified dermatitis due to sun   . Unspecified hypothyroidism     Past Surgical History:  Procedure Laterality Date  . cataract surgery Bilateral 10/12/2016  . childbirth     x2 "blocks"  . COLONOSCOPY     x2  . TONSILLECTOMY  1949   removed    No Known Allergies  Outpatient Encounter  Prescriptions as of 06/21/2017  Medication Sig  . atorvastatin (LIPITOR) 10 MG tablet TAKE 1 TABLET BY MOUTH DAILY FOR CHOLESTEROL  . CALCIUM PO Take 1 tablet by mouth daily.  . cholecalciferol (VITAMIN D) 1000 UNITS tablet Take 2,000 Units by mouth daily. Take 2 tablets daily  . citalopram (CELEXA) 10 MG tablet Take 1 tablet (10 mg total) by mouth daily.  . fish oil-omega-3 fatty acids 1000 MG capsule Take 2 g by mouth daily. Take one tablet once a day.  . fluticasone (FLONASE) 50 MCG/ACT nasal spray Place 1 spray into both nostrils daily.  Marland Kitchen ibuprofen (ADVIL,MOTRIN) 200 MG tablet Take 200 mg by mouth every 6 (six) hours as needed for moderate pain.  Marland Kitchen levothyroxine (SYNTHROID, LEVOTHROID) 50 MCG tablet TAKE 1 TABLET BY MOUTH ONCE DAILY 30 MINUTES PRIOR TO BREAKFAST FOR THYROID  . loratadine (CLARITIN) 10 MG tablet Take 10 mg by mouth daily.  . Multiple Vitamin (MULTIVITAMIN) tablet Take 1 tablet by mouth daily.   Marland Kitchen nystatin cream (MYCOSTATIN) Apply 1 application topically 2 (two) times daily. Under breast  . triamcinolone cream (KENALOG) 0.1 % Apply 1 application topically daily as needed (itching).    No facility-administered encounter medications on file as of 06/21/2017.     Review of Systems:  Review of  Systems  Constitutional: Negative for malaise/fatigue.  Respiratory: Negative for cough, shortness of breath and wheezing.   Cardiovascular: Negative for chest pain, palpitations and leg swelling.  Gastrointestinal: Negative for constipation and diarrhea.  Genitourinary: Negative for dysuria, frequency and urgency.  Musculoskeletal: Negative for back pain, joint pain and neck pain.  Neurological: Negative for weakness.    Health Maintenance  Topic Date Due  . INFLUENZA VACCINE  05/05/2017  . TETANUS/TDAP  10/05/2017  . MAMMOGRAM  07/14/2018  . DEXA SCAN  Completed  . PNA vac Low Risk Adult  Completed    Physical Exam: Vitals:   06/21/17 0835  BP: 138/80  Pulse: 62  Temp:  97.7 F (36.5 C)  TempSrc: Oral  SpO2: 96%  Weight: 186 lb (84.4 kg)   Body mass index is 30.95 kg/m. Physical Exam  Constitutional: She appears well-developed and well-nourished.  HENT:  Head: Normocephalic and atraumatic.  Cardiovascular: Normal rate, regular rhythm, normal heart sounds and intact distal pulses.   Pulmonary/Chest: Effort normal and breath sounds normal.  Abdominal: Bowel sounds are normal.  Musculoskeletal: She exhibits no edema, tenderness or deformity.  Skin: Skin is warm and dry. Capillary refill takes less than 2 seconds.  Psychiatric: She has a normal mood and affect. Her behavior is normal. Judgment and thought content normal.   Labs reviewed: Basic Metabolic Panel:  Recent Labs  04/02/17 1142 04/12/17 1610 04/23/17 0815  NA 140 138 138  K 4.0 4.1 4.3  CL 107 103 103  CO2 24 26 24   GLUCOSE 104* 92 90  BUN 13 21 19   CREATININE 1.06* 1.20* 1.01*  CALCIUM 9.0 8.8 8.7   Liver Function Tests:  Recent Labs  04/02/17 1142  AST 26  ALT 16  ALKPHOS 51  BILITOT 0.7  PROT 6.5  ALBUMIN 3.9   No results for input(s): LIPASE, AMYLASE in the last 8760 hours. No results for input(s): AMMONIA in the last 8760 hours. CBC:  Recent Labs  04/02/17 1142  WBC 8.1  NEUTROABS 6.0  HGB 13.9  HCT 42.1  MCV 93.8  PLT 246   Lipid Panel:  Recent Labs  12/15/16 0820  CHOL 161  HDL 69  LDLCALC 70  TRIG 110  CHOLHDL 2.3   Lab Results  Component Value Date   HGBA1C 5.1 12/15/2016    Procedures since last visit: No results found.  Assessment/Plan 1. Hypothyroidism, unspecified type Checking TSH today. Not having any complaints or symptoms at this time except difficulty losing weight which is not new  2. Hyperlipidemia with target LDL less than 100 Check labs today, cont to work on diet and exercise  3. Insomnia, unspecified type No issues or changes at this time, celexa helps for anxiety  4. Weakness of right arm Not having issues at  this time. Continue plan.  Cardiac workup for this and near syncope was negative with normal echo and exercise stress test with cardiology.    5. Hyperglycemia Last A1C 5.1, continue to manage weight and diet  6.  Need for influenza vaccine -flu shot given  Labs/tests ordered:   Orders Placed This Encounter  Procedures  . CBC with Differential/Platelet  . COMPLETE METABOLIC PANEL WITH GFR  . Hemoglobin A1c  . TSH  . Lipid panel   Next appt:  6 mos CPE, AWV  Nedda Gains L. Sterlin Knightly, D.O. [facility] [address], [place] [address] Cell Phone (Mon-Fri 8am-5pm):  [phone] On Call:  [phone] &  follow prompts after 5pm & weekends Office Phone:  [phone] Office Fax:  [phone]

## 2017-06-21 NOTE — Addendum Note (Signed)
Addended by: Despina Hidden on: 06/21/2017 10:37 AM   Modules accepted: Orders

## 2017-06-21 NOTE — Telephone Encounter (Signed)
-----   Message from Liliane Shi, [place] sent at 06/20/2017  1:12 PM EDT ----- Please call the patient. The stress shows no evidence of ischemia (loss of blood flow from a blockage). However, her blood pressure did increase significantly with exercise and was elevated pre-test. Please ask patient to monitor BP over 2-3 weeks and send readings for review. Please fax a copy of this study result to her PCP:  Gayland Curry, DO  Thanks! Richardson Dopp, PA-C    06/20/2017 1:10 PM

## 2017-06-21 NOTE — Telephone Encounter (Signed)
Ptcb and has been notified of both Echo and GXT results by phone with verbal understanding. Pt aware BP high during exercise on GXT and to monitor BP over the next 2-3 weeks and call with readings. Pt is agreeable to plan of care. Pt thanked me for my call today.

## 2017-06-21 NOTE — Telephone Encounter (Signed)
Lmtcb to go over test results.  

## 2017-06-21 NOTE — Telephone Encounter (Signed)
New message ° ° ° ° ° °Returning a call to the nurse to get test results °

## 2017-06-22 LAB — COMPLETE METABOLIC PANEL WITH GFR
AG Ratio: 2 (calc) (ref 1.0–2.5)
ALT: 16 U/L (ref 6–29)
AST: 19 U/L (ref 10–35)
Albumin: 4.2 g/dL (ref 3.6–5.1)
Alkaline phosphatase (APISO): 50 U/L (ref 33–130)
BUN/Creatinine Ratio: 21 (calc) (ref 6–22)
BUN: 20 mg/dL (ref 7–25)
CO2: 30 mmol/L (ref 20–32)
Calcium: 9 mg/dL (ref 8.6–10.4)
Chloride: 101 mmol/L (ref 98–110)
Creat: 0.95 mg/dL — ABNORMAL HIGH (ref 0.60–0.93)
GFR, Est African American: 68 mL/min/{1.73_m2} (ref >=60)
GFR, Est Non African American: 59 mL/min/{1.73_m2} — ABNORMAL LOW (ref >=60)
Globulin: 2.1 g/dL (calc) (ref 1.9–3.7)
Glucose, Bld: 90 mg/dL (ref 65–99)
Potassium: 4.4 mmol/L (ref 3.5–5.3)
Sodium: 137 mmol/L (ref 135–146)
Total Bilirubin: 0.7 mg/dL (ref 0.2–1.2)
Total Protein: 6.3 g/dL (ref 6.1–8.1)

## 2017-06-22 LAB — CBC WITH DIFFERENTIAL/PLATELET
Basophils Absolute: 70 cells/uL (ref 0–200)
Basophils Relative: 1.1 %
Eosinophils Absolute: 218 cells/uL (ref 15–500)
Eosinophils Relative: 3.4 %
HCT: 43.2 % (ref 35.0–45.0)
Hemoglobin: 14.7 g/dL (ref 11.7–15.5)
Lymphs Abs: 1722 cells/uL (ref 850–3900)
MCH: 31.2 pg (ref 27.0–33.0)
MCHC: 34 g/dL (ref 32.0–36.0)
MCV: 91.7 fL (ref 80.0–100.0)
MPV: 10.4 fL (ref 7.5–12.5)
Monocytes Relative: 10.1 %
Neutro Abs: 3744 cells/uL (ref 1500–7800)
Neutrophils Relative %: 58.5 %
Platelets: 261 10*3/uL (ref 140–400)
RBC: 4.71 10*6/uL (ref 3.80–5.10)
RDW: 13 % (ref 11.0–15.0)
Total Lymphocyte: 26.9 %
WBC mixed population: 646 cells/uL (ref 200–950)
WBC: 6.4 10*3/uL (ref 3.8–10.8)

## 2017-06-22 LAB — LIPID PANEL
Cholesterol: 168 mg/dL (ref <200)
HDL: 64 mg/dL (ref >50)
LDL Cholesterol (Calc): 77 mg/dL (calc)
Non-HDL Cholesterol (Calc): 104 mg/dL (calc) (ref <130)
Total CHOL/HDL Ratio: 2.6 (calc) (ref <5.0)
Triglycerides: 167 mg/dL — ABNORMAL HIGH (ref <150)

## 2017-06-22 LAB — HEMOGLOBIN A1C
Hgb A1c MFr Bld: 5.4 % of total Hgb (ref <5.7)
Mean Plasma Glucose: 108 (calc)
eAG (mmol/L): 6 (calc)

## 2017-06-22 LAB — TSH: TSH: 1.94 mIU/L (ref 0.40–4.50)

## 2017-06-24 ENCOUNTER — Encounter: Payer: Self-pay | Admitting: *Deleted

## 2017-07-02 ENCOUNTER — Encounter: Payer: Self-pay | Admitting: Physician Assistant

## 2017-07-06 NOTE — Telephone Encounter (Signed)
Routed to PA in error. He has already replied to the pt with his recommendations. I see that the pt has already read the PA's recommendations.

## 2017-07-19 ENCOUNTER — Ambulatory Visit
Admission: RE | Admit: 2017-07-19 | Discharge: 2017-07-19 | Disposition: A | Discharge status: Home / Self Care | Payer: Medicare Other | Source: Ambulatory Visit | Attending: Internal Medicine | Admitting: Internal Medicine

## 2017-07-19 ENCOUNTER — Other Ambulatory Visit: Payer: Self-pay | Admitting: Internal Medicine

## 2017-07-19 DIAGNOSIS — Z1231 Encounter for screening mammogram for malignant neoplasm of breast: Secondary | ICD-10-CM | POA: Diagnosis not present

## 2017-07-19 DIAGNOSIS — R928 Other abnormal and inconclusive findings on diagnostic imaging of breast: Secondary | ICD-10-CM

## 2017-07-22 ENCOUNTER — Ambulatory Visit: Admission: RE | Admit: 2017-07-22 | Payer: Medicare Other | Source: Ambulatory Visit

## 2017-07-22 ENCOUNTER — Ambulatory Visit
Admission: RE | Admit: 2017-07-22 | Discharge: 2017-07-22 | Disposition: A | Discharge status: Home / Self Care | Payer: Medicare Other | Source: Ambulatory Visit | Attending: Internal Medicine | Admitting: Internal Medicine

## 2017-07-22 DIAGNOSIS — R928 Other abnormal and inconclusive findings on diagnostic imaging of breast: Secondary | ICD-10-CM | POA: Diagnosis not present

## 2017-10-06 ENCOUNTER — Other Ambulatory Visit: Payer: Self-pay | Admitting: Internal Medicine

## 2017-11-29 ENCOUNTER — Other Ambulatory Visit: Payer: Self-pay | Admitting: Internal Medicine

## 2017-12-20 ENCOUNTER — Encounter: Payer: Medicare Other | Admitting: Internal Medicine

## 2017-12-23 ENCOUNTER — Encounter: Payer: Self-pay | Admitting: Nurse Practitioner

## 2017-12-23 ENCOUNTER — Ambulatory Visit (INDEPENDENT_AMBULATORY_CARE_PROVIDER_SITE_OTHER): Payer: Medicare Other | Admitting: Nurse Practitioner

## 2017-12-23 VITALS — BP 132/76 | HR 76 | Temp 97.9°F | Ht 65.0 in | Wt 174.6 lb

## 2017-12-23 DIAGNOSIS — F411 Generalized anxiety disorder: Secondary | ICD-10-CM | POA: Diagnosis not present

## 2017-12-23 DIAGNOSIS — F410 Panic disorder [episodic paroxysmal anxiety] without agoraphobia: Secondary | ICD-10-CM | POA: Diagnosis not present

## 2017-12-23 MED ORDER — LORAZEPAM 0.5 MG PO TABS: 0.5000 mg | ORAL_TABLET | Freq: Two times a day (BID) | ORAL | 0 refills | Status: DC | PRN

## 2017-12-23 MED ORDER — CITALOPRAM HYDROBROMIDE 20 MG PO TABS: 20.0000 mg | ORAL_TABLET | Freq: Every day | ORAL | 2 refills | Status: DC

## 2017-12-23 NOTE — Progress Notes (Signed)
Careteam: Patient Care Team: Gayland Curry, DO as PCP - General (Geriatric Medicine)  Advanced Directive information    No Known Allergies  Chief Complaint  Patient presents with  . Acute Visit    Pt is being seen for recent acute panic attacks. Pt unsure of what is triggering them.      HPI: Patient is a 77 y.o. female seen in the office today due to return of panic attacks. Pt with hx of anxiety and panic attacks.  [id number] had increase in anxiety and panic- went to specialist, therapist which was very effective.  Another episode in 2015 but has been good since.  Reports in the last few days she has been hypervigilance and has had increase anxiety with panic.  Last time she had an episode could not find a therapist and it was very frustrating, eventually she got through the episode.  Has been on Celexa for a while, increase to 20 mg when she was having increase anxiety but then went back down to 10 mg and has been on this for while.  Pt has previously needing lorazepam 0.5 mg every 8 hours short term when she has panic attacks. Last filled in 2016 (January)   Recent travel, saw her sister who was 77 and appeared to be much older than the last time she saw her.  Increase in activity/pans  that she really does want to do which she states stresses her.  Can not understand why she is feeling this way and that causes her more anxiety because there has been no trigger.   Review of Systems:  Review of Systems  Constitutional: Negative for chills, fever and malaise/fatigue.  Respiratory: Negative for sputum production.   Cardiovascular: Negative for chest pain.  Neurological: Negative for weakness.  Psychiatric/Behavioral: Negative for depression. The patient is nervous/anxious. The patient does not have insomnia.     Past Medical History:  Diagnosis Date  . Acute perichondritis of pinna   . Allergic rhinitis due to pollen   . Disorder of bone and cartilage, unspecified     . Encounter for long-term (current) use of other medications   . History of echocardiogram    Echo 9/18: EF 65-70  . History of exercise stress test 06/2017   ETT 9/18: no ischemia; hypertensive BP response  . HLD (hyperlipidemia)   . Osteoarthritis   . Panic disorder   . Unspecified dermatitis due to sun   . Unspecified hypothyroidism    Past Surgical History:  Procedure Laterality Date  . BREAST BIOPSY Right 07/14/2016  . BREAST BIOPSY Right 07/15/2016  . cataract surgery Bilateral 10/12/2016  . childbirth     x2 "blocks"  . COLONOSCOPY     x2  . TONSILLECTOMY  1949   removed   Social History:   reports that she has never smoked. She has never used smokeless tobacco. She reports that she drinks about 1.2 oz of alcohol per week. She reports that she does not use drugs.  Family History  Problem Relation Age of Onset  . Stroke Mother   . Heart failure Mother 24  . Hypertension Mother   . Heart attack Father 33  . Hyperlipidemia Sister   . Atrial fibrillation Sister   . Thyroid disease Sister   . Heart disease Son   . Thyroid disease Son   . Thyroid disease Daughter   . Atrial fibrillation Brother   . Breast cancer Maternal Grandmother 14  . Colon cancer Neg Hx  Medications: Patient's Medications  New Prescriptions   No medications on file  Previous Medications   ATORVASTATIN (LIPITOR) 10 MG TABLET    TAKE 1 TABLET BY MOUTH DAILY FOR CHOLESTEROL   CALCIUM PO    Take 1 tablet by mouth daily.   CHOLECALCIFEROL (VITAMIN D) 1000 UNITS TABLET    Take 2,000 Units by mouth daily. Take 2 tablets daily   CITALOPRAM (CELEXA) 10 MG TABLET    TAKE 1 TABLET BY MOUTH DAILY.   FISH OIL-OMEGA-3 FATTY ACIDS 1000 MG CAPSULE    Take 2 g by mouth daily. Take one tablet once a day.   FLUTICASONE (FLONASE) 50 MCG/ACT NASAL SPRAY    Place 1 spray into both nostrils daily.   IBUPROFEN (ADVIL,MOTRIN) 200 MG TABLET    Take 200 mg by mouth every 6 (six) hours as needed for moderate  pain.   LEVOTHYROXINE (SYNTHROID, LEVOTHROID) 50 MCG TABLET    TAKE 1 TABLET BY MOUTH ONCE DAILY 30 MINUTES PRIOR TO BREAKFAST FOR THYROID   LORATADINE (CLARITIN) 10 MG TABLET    Take 10 mg by mouth daily.   MULTIPLE VITAMIN (MULTIVITAMIN) TABLET    Take 1 tablet by mouth daily.    NYSTATIN CREAM (MYCOSTATIN)    Apply 1 application topically 2 (two) times daily. Under breast   TRIAMCINOLONE CREAM (KENALOG) 0.1 %    Apply 1 application topically daily as needed (itching).   Modified Medications   No medications on file  Discontinued Medications   No medications on file     Physical Exam:  Vitals:   12/23/17 1543  BP: 132/76  Pulse: 76  Temp: 97.9 F (36.6 C)  TempSrc: Oral  SpO2: 99%  Weight: 174 lb 9.6 oz (79.2 kg)  Height: 5\' 5"  (1.651 m)   Body mass index is 29.05 kg/m.  Physical Exam  Constitutional: She appears well-developed and well-nourished.  Cardiovascular: Normal rate, regular rhythm and normal heart sounds.  Pulmonary/Chest: Effort normal and breath sounds normal.  Skin: Skin is warm and dry.  Psychiatric: Her speech is normal and behavior is normal. Thought content normal. Her mood appears anxious. She expresses no suicidal plans and no homicidal plans.    Labs reviewed: Basic Metabolic Panel: Recent Labs    04/12/17 1610 04/23/17 0815 06/21/17 0953  NA 138 138 137  K 4.1 4.3 4.4  CL 103 103 101  CO2 26 24 30   GLUCOSE 92 90 90  BUN 21 19 20   CREATININE 1.20* 1.01* 0.95*  CALCIUM 8.8 8.7 9.0  TSH  --   --  1.94   Liver Function Tests: Recent Labs    04/02/17 1142 06/21/17 0953  AST 26 19  ALT 16 16  ALKPHOS 51  --   BILITOT 0.7 0.7  PROT 6.5 6.3  ALBUMIN 3.9  --    No results for input(s): LIPASE, AMYLASE in the last 8760 hours. No results for input(s): AMMONIA in the last 8760 hours. CBC: Recent Labs    04/02/17 1142 06/21/17 0953  WBC 8.1 6.4  NEUTROABS 6.0 3,744  HGB 13.9 14.7  HCT 42.1 43.2  MCV 93.8 91.7  PLT 246 261    Lipid Panel: Recent Labs    06/21/17 0953  CHOL 168  HDL 64  LDLCALC 77  TRIG 167*  CHOLHDL 2.6   TSH: Recent Labs    06/21/17 0953  TSH 1.94   A1C: Lab Results  Component Value Date   HGBA1C 5.4 06/21/2017  Assessment/Plan 1. Generalized anxiety disorder with panic attacks -information provided for counseling services will also place Ambulatory referral to [facility] to also help her find a therapist.  - LORazepam (ATIVAN) 0.5 MG tablet; Take 1 tablet (0.5 mg total) by mouth 2 (two) times daily as needed for anxiety.  Dispense: 30 tablet; Refill: 0 Will increase celexa to 20 mg daily from 10 mg daily  - citalopram (CELEXA) 20 MG tablet; Take 1 tablet (20 mg total) by mouth daily.  Dispense: 30 tablet; Refill: 2  Next appt: to keep follow up with Dr Mariea Clonts next month. Sooner if needed.  Carlos American. [place], [facility] [phone]

## 2017-12-28 ENCOUNTER — Telehealth: Payer: Self-pay | Admitting: *Deleted

## 2017-12-28 NOTE — Telephone Encounter (Signed)
Patient called and stated that she is going to see Therapist, Sampson Goon and she suggested patient to switch from Ativan to Xanax for her anxiety. Would like for PCP to change. Patient is going to have therapist fax her notes to our office. Fax number given. Please Advise.

## 2017-12-29 NOTE — Telephone Encounter (Signed)
I will wait on the notes.  Generally I prefer lorazepam b/c it's got fewer side effects with age than xanax.

## 2017-12-30 NOTE — Telephone Encounter (Signed)
Received note from Burnard Leigh [phone] Fax:[phone] Placed in Dr. Cyndi Lennert folder to review.

## 2018-01-03 ENCOUNTER — Other Ambulatory Visit: Payer: Self-pay | Admitting: Internal Medicine

## 2018-01-03 DIAGNOSIS — F410 Panic disorder [episodic paroxysmal anxiety] without agoraphobia: Secondary | ICD-10-CM

## 2018-01-03 DIAGNOSIS — F411 Generalized anxiety disorder: Principal | ICD-10-CM

## 2018-01-03 MED ORDER — ALPRAZOLAM 0.25 MG PO TABS: 0.2500 mg | ORAL_TABLET | Freq: Two times a day (BID) | ORAL | 3 refills | Status: DC | PRN

## 2018-01-03 NOTE — Progress Notes (Signed)
Due to recommendations from pt's therapist, Windy Carina, I agree to changing her lorazepam to xanax low dose prn panic attacks.  New Rx sent to her pharmacy. Erum Cercone L. Brayden Brodhead, D.O. [facility] [address], [place] [address] Cell Phone (Mon-Fri 8am-5pm):  [phone] On Call:  [phone] & follow prompts after 5pm & weekends Office Phone:  [phone] Office Fax:  [phone]

## 2018-01-04 DIAGNOSIS — H26491 Other secondary cataract, right eye: Secondary | ICD-10-CM | POA: Diagnosis not present

## 2018-01-04 DIAGNOSIS — Z9842 Cataract extraction status, left eye: Secondary | ICD-10-CM | POA: Diagnosis not present

## 2018-01-04 DIAGNOSIS — Z9841 Cataract extraction status, right eye: Secondary | ICD-10-CM | POA: Diagnosis not present

## 2018-01-04 DIAGNOSIS — H5211 Myopia, right eye: Secondary | ICD-10-CM | POA: Diagnosis not present

## 2018-01-05 DIAGNOSIS — F410 Panic disorder [episodic paroxysmal anxiety] without agoraphobia: Secondary | ICD-10-CM | POA: Diagnosis not present

## 2018-01-11 DIAGNOSIS — F410 Panic disorder [episodic paroxysmal anxiety] without agoraphobia: Secondary | ICD-10-CM | POA: Diagnosis not present

## 2018-01-20 DIAGNOSIS — F410 Panic disorder [episodic paroxysmal anxiety] without agoraphobia: Secondary | ICD-10-CM | POA: Diagnosis not present

## 2018-01-27 ENCOUNTER — Encounter (INDEPENDENT_AMBULATORY_CARE_PROVIDER_SITE_OTHER): Payer: Self-pay

## 2018-01-27 ENCOUNTER — Encounter: Payer: Self-pay | Admitting: Internal Medicine

## 2018-01-27 ENCOUNTER — Ambulatory Visit (INDEPENDENT_AMBULATORY_CARE_PROVIDER_SITE_OTHER): Payer: Medicare Other | Admitting: Internal Medicine

## 2018-01-27 VITALS — BP 124/72 | HR 63 | Temp 98.0°F | Ht 65.0 in | Wt 172.8 lb

## 2018-01-27 DIAGNOSIS — E663 Overweight: Secondary | ICD-10-CM | POA: Diagnosis not present

## 2018-01-27 DIAGNOSIS — H6123 Impacted cerumen, bilateral: Secondary | ICD-10-CM | POA: Diagnosis not present

## 2018-01-27 DIAGNOSIS — Z23 Encounter for immunization: Secondary | ICD-10-CM | POA: Diagnosis not present

## 2018-01-27 DIAGNOSIS — E039 Hypothyroidism, unspecified: Secondary | ICD-10-CM | POA: Diagnosis not present

## 2018-01-27 DIAGNOSIS — E785 Hyperlipidemia, unspecified: Secondary | ICD-10-CM

## 2018-01-27 DIAGNOSIS — F411 Generalized anxiety disorder: Secondary | ICD-10-CM | POA: Insufficient documentation

## 2018-01-27 DIAGNOSIS — Z0000 Encounter for general adult medical examination without abnormal findings: Secondary | ICD-10-CM

## 2018-01-27 DIAGNOSIS — F410 Panic disorder [episodic paroxysmal anxiety] without agoraphobia: Secondary | ICD-10-CM

## 2018-01-27 LAB — LIPID PANEL
Cholesterol: 138 mg/dL (ref <200)
HDL: 60 mg/dL (ref >50)
LDL Cholesterol (Calc): 62 mg/dL (calc)
Non-HDL Cholesterol (Calc): 78 mg/dL (calc) (ref <130)
Total CHOL/HDL Ratio: 2.3 (calc) (ref <5.0)
Triglycerides: 75 mg/dL (ref <150)

## 2018-01-27 LAB — CBC WITH DIFFERENTIAL/PLATELET
Basophils Absolute: 51 cells/uL (ref 0–200)
Basophils Relative: 0.9 %
Eosinophils Absolute: 148 cells/uL (ref 15–500)
Eosinophils Relative: 2.6 %
HCT: 39.5 % (ref 35.0–45.0)
Hemoglobin: 13.5 g/dL (ref 11.7–15.5)
Lymphs Abs: 1226 cells/uL (ref 850–3900)
MCH: 31.1 pg (ref 27.0–33.0)
MCHC: 34.2 g/dL (ref 32.0–36.0)
MCV: 91 fL (ref 80.0–100.0)
MPV: 10.8 fL (ref 7.5–12.5)
Monocytes Relative: 10.9 %
Neutro Abs: 3654 cells/uL (ref 1500–7800)
Neutrophils Relative %: 64.1 %
Platelets: 263 10*3/uL (ref 140–400)
RBC: 4.34 10*6/uL (ref 3.80–5.10)
RDW: 13 % (ref 11.0–15.0)
Total Lymphocyte: 21.5 %
WBC mixed population: 621 cells/uL (ref 200–950)
WBC: 5.7 10*3/uL (ref 3.8–10.8)

## 2018-01-27 LAB — COMPLETE METABOLIC PANEL WITH GFR
AG Ratio: 1.7 (calc) (ref 1.0–2.5)
ALT: 13 U/L (ref 6–29)
AST: 21 U/L (ref 10–35)
Albumin: 4 g/dL (ref 3.6–5.1)
Alkaline phosphatase (APISO): 44 U/L (ref 33–130)
BUN/Creatinine Ratio: 20 (calc) (ref 6–22)
BUN: 20 mg/dL (ref 7–25)
CO2: 30 mmol/L (ref 20–32)
Calcium: 9.4 mg/dL (ref 8.6–10.4)
Chloride: 103 mmol/L (ref 98–110)
Creat: 1.02 mg/dL — ABNORMAL HIGH (ref 0.60–0.93)
GFR, Est African American: 62 mL/min/{1.73_m2} (ref >=60)
GFR, Est Non African American: 54 mL/min/{1.73_m2} — ABNORMAL LOW (ref >=60)
Globulin: 2.3 g/dL (calc) (ref 1.9–3.7)
Glucose, Bld: 93 mg/dL (ref 65–99)
Potassium: 4.8 mmol/L (ref 3.5–5.3)
Sodium: 138 mmol/L (ref 135–146)
Total Bilirubin: 0.9 mg/dL (ref 0.2–1.2)
Total Protein: 6.3 g/dL (ref 6.1–8.1)

## 2018-01-27 LAB — TSH: TSH: 1.27 mIU/L (ref 0.40–4.50)

## 2018-01-27 MED ORDER — NYSTATIN 100000 UNIT/GM EX CREA: 1.0000 "application " | TOPICAL_CREAM | Freq: Two times a day (BID) | CUTANEOUS | 3 refills | Status: DC

## 2018-01-27 MED ORDER — ZOSTER VAC RECOMB ADJUVANTED 50 MCG/0.5ML IM SUSR: 0.5000 mL | Freq: Once | INTRAMUSCULAR | 1 refills | Status: DC

## 2018-01-27 MED ORDER — TETANUS-DIPHTH-ACELL PERTUSSIS 5-2.5-18.5 LF-MCG/0.5 IM SUSP: 0.5000 mL | Freq: Once | INTRAMUSCULAR | 0 refills | Status: DC

## 2018-01-27 MED ORDER — FLUTICASONE PROPIONATE 50 MCG/ACT NA SUSP: 1.0000 | Freq: Every day | NASAL | 3 refills | Status: DC

## 2018-01-27 NOTE — Patient Instructions (Addendum)
Keep up the good work on your diet and exercise routine, as well as your self-care psychologically speaking.  You're doing great!  Enjoy your golf season.  Drink plenty of water while golfing.    Please go get your tdap at the pharmacy.    Also, start the series of shingles vaccinations at [facility].  Call first to be sure they have it available.   We'll hold off on your bone density for now b/c it is stable and even improving.

## 2018-01-27 NOTE — ACP (Advance Care Planning) (Signed)
Pt brought her 5 wishes form again today and I reviewed the copy in the system and it was poor so we copied it again to be rescanned.  Discussed current state--Reports Code Status: FULL CODE at this time; would only want temporary support, but if quality of life would be poor long-term (stroke with little hope of recovery), she would not want ongoing treatment; see also her 5 WISHES Living Will Form.

## 2018-01-27 NOTE — Progress Notes (Signed)
Provider:  [name], D.O., C.M.D. Location:   [facility]  Place of Service:   Clinic  Previous PCP: Reed, Tiffany L, DO Patient Care Team: Reed, Tiffany L, DO as PCP - General (Geriatric Medicine)  Extended Emergency Contact Information Primary Emergency Contact: Mccarney,Kenneth Address: [address]          [place], [place] [address] [place] Home Phone: [phone] Relation: Spouse  Code Status: FULL CODE at this time; would only want temporary support, but if quality of life would be poor long-term, she would not want ongoing treatment; see also her 5 WISHES Living Will Form  Goals of Care: Advanced Directive information Advanced Directives 06/21/2017  Does Patient Have a Medical Advance Directive? Yes  Type of Advance Directive Living will  Does patient want to make changes to medical advance directive? -  Copy of Healthcare Power of Attorney in Chart? -  Has 5 wishes form on file  Chief Complaint  Patient presents with  . Annual Exam    CPE     HPI: Patient is a 76 y.o. female seen today for an annual physical exam.  She is down 3 more lbs since 3/21.  Her goal is 160 lbs.   She feels better.  She's doing weight watchers along with her husband.    Her anxiety stuff had gone crazy.  She came in to see Jessica 3/21 and she upped her celexa and keep the lorazepam.    The therapist she saw recommended xanax to be faster acting for her panic attacks.  She is working with her and has only used two tablets since that was started.  They are working together to understand the triggers and work to calm herself.    It's golf season and she requests renewals of flonase and nystatin cream.    Second toes are moving medially over great toes.  OA of knees:  Doing fine.  Reports that she was overextending her squats during tai chi.  Doing Y silver sneakers.    No elbow pain so far.  Right shoulder tweaks a little when she swings wrong golfing, but otherwise ok most  of the time.  Past Medical History:  Diagnosis Date  . Acute perichondritis of pinna   . Allergic rhinitis due to pollen   . Disorder of bone and cartilage, unspecified   . Encounter for long-term (current) use of other medications   . History of echocardiogram    Echo 9/18: EF 65-70  . History of exercise stress test 06/2017   ETT 9/18: no ischemia; hypertensive BP response  . HLD (hyperlipidemia)   . Osteoarthritis   . Panic disorder   . Unspecified dermatitis due to sun   . Unspecified hypothyroidism    Past Surgical History:  Procedure Laterality Date  . BREAST BIOPSY Right 07/14/2016  . BREAST BIOPSY Right 07/15/2016  . cataract surgery Bilateral 10/12/2016  . childbirth     x2 "blocks"  . COLONOSCOPY     x2  . TONSILLECTOMY  1949   removed    reports that she has never smoked. She has never used smokeless tobacco. She reports that she drinks about 1.2 oz of alcohol per week. She reports that she does not use drugs.  Functional Status Survey:    Family History  Problem Relation Age of Onset  . Stroke Mother   . Heart failure Mother 98  . Hypertension Mother   . Heart attack Father 77  . Hyperlipidemia Sister   .   Atrial fibrillation Sister   . Thyroid disease Sister   . Heart disease Son   . Thyroid disease Son   . Thyroid disease Daughter   . Atrial fibrillation Brother   . Breast cancer Maternal Grandmother 24  . Colon cancer Neg Hx     Health Maintenance  Topic Date Due  . TETANUS/TDAP  10/05/2017  . INFLUENZA VACCINE  05/05/2018  . DEXA SCAN  Completed  . PNA vac Low Risk Adult  Completed    No Known Allergies  Outpatient Encounter Medications as of 01/27/2018  Medication Sig  . ALPRAZolam (XANAX) 0.25 MG tablet Take 1 tablet (0.25 mg total) by mouth 2 (two) times daily as needed (panic attacks).  Marland Kitchen atorvastatin (LIPITOR) 10 MG tablet TAKE 1 TABLET BY MOUTH DAILY FOR CHOLESTEROL  . CALCIUM PO Take 1 tablet by mouth daily.  . cholecalciferol  (VITAMIN D) 1000 UNITS tablet Take 2,000 Units by mouth daily. Take 2 tablets daily  . citalopram (CELEXA) 20 MG tablet Take 1 tablet (20 mg total) by mouth daily.  . fish oil-omega-3 fatty acids 1000 MG capsule Take 2 g by mouth daily. Take one tablet once a day.  . fluticasone (FLONASE) 50 MCG/ACT nasal spray Place 1 spray into both nostrils daily.  Marland Kitchen ibuprofen (ADVIL,MOTRIN) 200 MG tablet Take 200 mg by mouth every 6 (six) hours as needed for moderate pain.  Marland Kitchen levothyroxine (SYNTHROID, LEVOTHROID) 50 MCG tablet TAKE 1 TABLET BY MOUTH ONCE DAILY 30 MINUTES PRIOR TO BREAKFAST FOR THYROID  . loratadine (CLARITIN) 10 MG tablet Take 10 mg by mouth daily.  . Multiple Vitamin (MULTIVITAMIN) tablet Take 1 tablet by mouth daily.   Marland Kitchen nystatin cream (MYCOSTATIN) Apply 1 application topically 2 (two) times daily. Under breast  . triamcinolone cream (KENALOG) 0.1 % Apply 1 application topically daily as needed (itching).    No facility-administered encounter medications on file as of 01/27/2018.     Review of Systems  Constitutional: Negative for chills, fever and malaise/fatigue.  HENT: Negative for congestion and hearing loss.   Eyes: Negative for blurred vision.  Respiratory: Negative for cough and shortness of breath.   Cardiovascular: Negative for chest pain, palpitations and leg swelling.  Gastrointestinal: Negative for abdominal pain, blood in stool, constipation, diarrhea, heartburn, melena, nausea and vomiting.  Genitourinary: Positive for frequency and urgency. Negative for dysuria.  Musculoskeletal: Positive for joint pain. Negative for back pain, falls, myalgias and neck pain.  Skin: Negative for itching and rash.  Neurological: Negative for dizziness and loss of consciousness.  Endo/Heme/Allergies: Does not bruise/bleed easily.  Psychiatric/Behavioral: Negative for depression and memory loss. The patient is nervous/anxious. The patient does not have insomnia.     Vitals:   01/27/18  0859  BP: 124/72  Pulse: 63  Temp: 98 F (36.7 C)  TempSrc: Oral  SpO2: 95%  Weight: 172 lb 12.8 oz (78.4 kg)  Height: 5' 5" (1.651 m)   Body mass index is 28.76 kg/m. Physical Exam  Constitutional: She is oriented to person, place, and time. She appears well-developed and well-nourished. No distress.  HENT:  Head: Normocephalic and atraumatic.  Right Ear: External ear normal.  Left Ear: External ear normal.  Nose: Nose normal.  Mouth/Throat: Oropharynx is clear and moist. No oropharyngeal exudate.  Eyes: Pupils are equal, round, and reactive to light. Conjunctivae and EOM are normal.  Watery eyes today  Neck: Neck supple. No JVD present. No thyromegaly present.  Cardiovascular: Normal rate, regular rhythm, normal heart sounds  and intact distal pulses. Exam reveals no gallop and no friction rub.  No murmur heard. Pulmonary/Chest: Effort normal and breath sounds normal. No respiratory distress. Right breast exhibits no inverted nipple, no mass, no nipple discharge, no skin change and no tenderness. Left breast exhibits no inverted nipple, no mass, no nipple discharge, no skin change and no tenderness.  Abdominal: Soft. Bowel sounds are normal. She exhibits no distension and no mass. There is no tenderness. There is no rebound and no guarding.  Musculoskeletal: Normal range of motion. She exhibits no tenderness.  Left second toe overlapping great toe now slightly and very mild similar finding on right foot  Lymphadenopathy:    She has no cervical adenopathy.  Neurological: She is alert and oriented to person, place, and time. She displays normal reflexes. No cranial nerve deficit or sensory deficit. She exhibits normal muscle tone. Coordination normal.  Skin: Skin is warm and dry.  Psychiatric: She has a normal mood and affect.    Labs reviewed: Basic Metabolic Panel: Recent Labs    04/12/17 1610 04/23/17 0815 06/21/17 0953  NA 138 138 137  K 4.1 4.3 4.4  CL 103 103 101    CO2 _0 GLUCOSE 92 90 90  BUN _1 CREATININE 1.20* 1.01* 0.95*  CALCIUM 8.8 8.7 9.0   Liver Function Tests: Recent Labs    04/02/17 1142 06/21/17 0953  AST 26 19  ALT 16 16  ALKPHOS 51  --   BILITOT 0.7 0.7  PROT 6.5 6.3  ALBUMIN 3.9  --    No results for input(s): LIPASE, AMYLASE in the last 8760 hours. No results for input(s): AMMONIA in the last 8760 hours. CBC: Recent Labs    04/02/17 1142 06/21/17 0953  WBC 8.1 6.4  NEUTROABS 6.0 3,744  HGB 13.9 14.7  HCT 42.1 43.2  MCV 93.8 91.7  PLT 246 261   Cardiac Enzymes: No results for input(s): CKTOTAL, CKMB, CKMBINDEX, TROPONINI in the last 8760 hours. BNP: Invalid input(s): POCBNP Lab Results  Component Value Date   HGBA1C 5.4 06/21/2017   Lab Results  Component Value Date   TSH 1.94 06/21/2017   Assessment/Plan ANNUAL CPE PERFORMED TODAY AS ABOVE.  Decided to defer the bone density due to improving status (will plan to do at 4 years rather than 2).    1. Generalized anxiety disorder with panic attacks -doing better with regular counseling, celexa 32m daily and her xanax prn which she's used twice since I ordered it for her at her therapist's recommendation on 01/03/18  2. Hypothyroidism, unspecified type -cont levothyroxine 575m daily and f/u tsh today - TSH  3. Hyperlipidemia with target LDL less than 100 -last LDL 77 in September on lipitor 1068maily - recheck Lipid panel due to diet and exercise program--would be great if she would no longer need statin therapy  4. Overweight (BMI 25.0-29.9) - has lost 20 lbs with weightwatchers, diet and exercise - CBC with Differential/Platelet - COMPLETE METABOLIC PANEL WITH GFR - Lipid panel - TSH  5. Bilateral impacted cerumen -bilateral flushing with warm water and peroxide performed, no instrumentation used  6. [facility] Tdap ([place]-2.5-18.5 LF-MCG/0.5 injection; Inject 0.5 mLs into the  muscle once for 1 dose.  Dispense: 0.5 mL; Refill: 0  7. Need for shingles vaccine - Zoster Vaccine Adjuvanted ([facility]; Inject 0.5 mLs into the muscle once for 1 dose.  Dispense: 0.5 mL;  Refill: 1--instructed to get at [facility]  Labs/tests ordered:   Orders Placed This Encounter  Procedures  . CBC with Differential/Platelet  . COMPLETE METABOLIC PANEL WITH GFR  . Lipid panel  . TSH   F/u 6 mos med mgt, labs before  [name], D.O. Geriatrics [facility] [address] [place], [place] [address] Cell Phone (Mon-Fri 8am-5pm):  [phone] On Call:  [phone] & follow prompts after 5pm & weekends Office Phone:  [phone] Office Fax:  [phone]  

## 2018-01-28 MED ORDER — ZOSTER VAC RECOMB ADJUVANTED 50 MCG/0.5ML IM SUSR: 0.5000 mL | Freq: Once | INTRAMUSCULAR | 1 refills | Status: AC

## 2018-01-28 MED ORDER — TETANUS-DIPHTH-ACELL PERTUSSIS 5-2.5-18.5 LF-MCG/0.5 IM SUSP: 0.5000 mL | Freq: Once | INTRAMUSCULAR | 0 refills | Status: AC

## 2018-01-28 MED ORDER — TETANUS-DIPHTH-ACELL PERTUSSIS 5-2.5-18.5 LF-MCG/0.5 IM SUSP: 0.5000 mL | Freq: Once | INTRAMUSCULAR | 0 refills | Status: DC

## 2018-01-31 DIAGNOSIS — F410 Panic disorder [episodic paroxysmal anxiety] without agoraphobia: Secondary | ICD-10-CM | POA: Diagnosis not present

## 2018-02-10 DIAGNOSIS — F410 Panic disorder [episodic paroxysmal anxiety] without agoraphobia: Secondary | ICD-10-CM | POA: Diagnosis not present

## 2018-02-25 DIAGNOSIS — F410 Panic disorder [episodic paroxysmal anxiety] without agoraphobia: Secondary | ICD-10-CM | POA: Diagnosis not present

## 2018-03-22 DIAGNOSIS — F410 Panic disorder [episodic paroxysmal anxiety] without agoraphobia: Secondary | ICD-10-CM | POA: Diagnosis not present

## 2018-04-06 ENCOUNTER — Other Ambulatory Visit: Payer: Self-pay | Admitting: Nurse Practitioner

## 2018-04-06 DIAGNOSIS — F410 Panic disorder [episodic paroxysmal anxiety] without agoraphobia: Secondary | ICD-10-CM

## 2018-04-06 DIAGNOSIS — F411 Generalized anxiety disorder: Principal | ICD-10-CM

## 2018-04-18 ENCOUNTER — Telehealth: Payer: Self-pay

## 2018-04-18 NOTE — Telephone Encounter (Signed)
Patient called c/o right knee pain that radiates down leg. Pain onset Friday 04/15/18 following a golf game.. Patient tried Advil and cold compresses with minimal relief. Patient is requesting an afternoon appointment.  No afternoon appointments available this week in the afternoon, patient refused 11:00 am appointment for Wednesday with Dr.Carter.    Please advise

## 2018-04-18 NOTE — Telephone Encounter (Signed)
I recommend Rest, Ice, Compress with an ACE wrap, Elevate.  She may continue advil with food.  Please encourage her to take the next available appt.

## 2018-04-18 NOTE — Telephone Encounter (Signed)
Spoke with patient, patient verbalized Dr.Reed's recommendations. Patient scheduled appointment for next Tuesday with Dr.Hopper @ 1:00 pm

## 2018-04-26 ENCOUNTER — Encounter: Payer: Self-pay | Admitting: Internal Medicine

## 2018-04-26 ENCOUNTER — Ambulatory Visit (INDEPENDENT_AMBULATORY_CARE_PROVIDER_SITE_OTHER): Payer: Medicare Other | Admitting: Internal Medicine

## 2018-04-26 VITALS — BP 118/74 | HR 78 | Temp 98.0°F | Ht 65.0 in | Wt 170.0 lb

## 2018-04-26 DIAGNOSIS — M25461 Effusion, right knee: Secondary | ICD-10-CM

## 2018-04-26 DIAGNOSIS — M1731 Unilateral post-traumatic osteoarthritis, right knee: Secondary | ICD-10-CM

## 2018-04-26 DIAGNOSIS — F410 Panic disorder [episodic paroxysmal anxiety] without agoraphobia: Secondary | ICD-10-CM | POA: Diagnosis not present

## 2018-04-26 NOTE — Patient Instructions (Addendum)
Consider glucosamine sulfate 1500 mg daily for joint symptoms. Take this daily  for 3 months and then leave it off for 2 months. This will rehydrate the cartilages. If Voltaren gel is too expensive, use an anti-inflammatory cream such as Aspercreme or Zostrix cream twice a day to the affected area as needed. In lieu of this warm moist compresses or  hot water bottle can be used. Orthopedic referral if symptoms persist or progress.
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
## 2018-04-27 ENCOUNTER — Telehealth: Payer: Self-pay | Admitting: *Deleted

## 2018-04-27 ENCOUNTER — Encounter: Payer: Self-pay | Admitting: Internal Medicine

## 2018-04-27 DIAGNOSIS — M1731 Unilateral post-traumatic osteoarthritis, right knee: Secondary | ICD-10-CM

## 2018-04-27 DIAGNOSIS — M25461 Effusion, right knee: Secondary | ICD-10-CM

## 2018-04-27 MED ORDER — DICLOFENAC SODIUM 1 % TD GEL: 4.0000 g | Freq: Four times a day (QID) | TRANSDERMAL | 5 refills | Status: DC

## 2018-04-27 NOTE — Telephone Encounter (Signed)
Sent in Rx to [facility] for a standard dose voltaren gel.  Looks like it requires a prior [place].  It will be the generic.

## 2018-04-27 NOTE — Telephone Encounter (Signed)
Patient notified

## 2018-04-27 NOTE — Telephone Encounter (Signed)
Initiated Prior authorization through [facility]. Awaiting determination.  Key [id number] BIN: [id number] DQV:HQITU ID: [id number]

## 2018-04-27 NOTE — Telephone Encounter (Signed)
Patient called and stated that she saw Dr. Linna Darner yesterday for her knee pain. Stated that Dr. Linna Darner gave her a Rx for Voltaren Gel 16%. Patient stated that she took it to her pharmacy and they told her that it was a compound and she would have to take it to a compound pharmacy and told her [place] at [place]. Patient wants to know your advise towards this before she goes hunting a pharmacy to fill a Rx. Please Advise.

## 2018-04-28 NOTE — Telephone Encounter (Signed)
Received call from [employer] and Diclofenac Gel APPROVED for 1 year.

## 2018-05-26 ENCOUNTER — Ambulatory Visit: Payer: Self-pay | Admitting: Family

## 2018-05-27 ENCOUNTER — Telehealth: Payer: Self-pay | Admitting: *Deleted

## 2018-05-27 NOTE — Telephone Encounter (Signed)
Patient stated that the pain has eased off. No fever. No other symptoms noted. Scheduled an appointment for Monday to be evaluated. Patient will go to the Urgent Care if any new symptoms or worsening of symptom.

## 2018-05-27 NOTE — Telephone Encounter (Signed)
Patient called and left message on clinical intake and stated that she was having some Left Lower Abdominal pain. No other symptoms and stated other than that she felt good.   Tried calling patient back and LMOM to return call.

## 2018-05-30 ENCOUNTER — Encounter: Payer: Self-pay | Admitting: Internal Medicine

## 2018-05-30 ENCOUNTER — Ambulatory Visit (INDEPENDENT_AMBULATORY_CARE_PROVIDER_SITE_OTHER): Payer: Medicare Other | Admitting: Internal Medicine

## 2018-05-30 VITALS — BP 130/80 | HR 72 | Temp 98.2°F | Ht 65.0 in | Wt 169.0 lb

## 2018-05-30 DIAGNOSIS — M5416 Radiculopathy, lumbar region: Secondary | ICD-10-CM | POA: Diagnosis not present

## 2018-05-30 NOTE — Patient Instructions (Signed)
The best exercises for the low back include freestyle swimming, stretch aerobics, and yoga.Cybex & Nautilus machines rather than dead weights are better for the back.Salonpas topically is an excellent option for the lumbar radiculopathy if symptoms persist or progress. Please report warning signs as we discussed. Worrisome would be  radicular pain; leg numbness , tingling or weakness ; & incontinence of urine or stool.

## 2018-05-30 NOTE — Progress Notes (Signed)
   This is a Chief Financial Officer office visit  follow up for specific acute issue of LLQ abdominal pain.  Interim medical record and care since last [place] visit was updated with review of diagnostic studies and change in clinical status since last visit were documented.  HPI: Her symptoms began 05/25/2018 the day after playing golf.  She states she has been playing increased amounts of golf recently.  The pain was intermittent from 8/21-8/24, worse with standing.  Described as sharp to dull lasting seconds but with some minor discomfort fairly consistently.  She also noted left flank discomfort as well as discomfort in the right anterior thigh. She denies any cardiac, pulmonary, GI, or GU symptoms.  Review of systems: Constitutional: No fever, significant weight change, fatigue   Cardiovascular: No chest pain, palpitations, paroxysmal nocturnal dyspnea, claudication, edema  Respiratory: No cough, sputum production, hemoptysis, DOE, significant snoring, apnea   Gastrointestinal: No heartburn, dysphagia, nausea /vomiting, rectal bleeding, melena, change in bowels Genitourinary: No dysuria, hematuria, pyuria, incontinence, nocturia Musculoskeletal: No new joint stiffness, joint swelling, weakness, pain Dermatologic: No rash, pruritus, change in appearance of skin Neurologic: No dizziness, headache, syncope, seizures, numbness, tingling Endocrine: No change in hair/skin/nails, excessive thirst, excessive hunger, excessive urination  Hematologic/lymphatic: No significant bruising, lymphadenopathy, abnormal bleeding  Physical exam:  Pertinent or positive findings: She has minimal Anisocoria with the right pupil minimally larger than the left.  There is crepitus of the right knee without associated effusion.Minimal DIP changes R hand.  Straight leg raising is negative bilaterally. General appearance: Adequately nourished; no acute distress, increased work of breathing is present.   Lymphatic: No  lymphadenopathy about the head, neck, axilla. Eyes: No conjunctival inflammation or lid edema is present. There is no scleral icterus. Ears:  External ear exam shows no significant lesions or deformities.   Nose:  External nasal examination shows no deformity or inflammation. Nasal mucosa are pink and moist without lesions, exudates Oral exam:  Lips and gums are healthy appearing. There is no oropharyngeal erythema or exudate. Neck:  No thyromegaly, masses, tenderness noted.    Heart:  Normal rate and regular rhythm. S1 and S2 normal without gallop, murmur, click, rub .  Lungs: Chest clear to auscultation without wheezes, rhonchi, rales, rubs. Abdomen: Bowel sounds are normal. Abdomen is soft and nontender with no organomegaly, hernias, masses. GU: Deferred  Extremities:  No cyanosis, clubbing, edema  Neurologic exam : Strength equal  in  lower extremities Deep tendon reflexes are equal Gait normal Skin: Warm & dry w/o tenting. No significant lesions or rash.  #1 lumbar radiculopathy, L2 Plan: Stretch exercises pre golf & Salonpas prn

## 2018-06-09 ENCOUNTER — Other Ambulatory Visit: Payer: Self-pay | Admitting: Internal Medicine

## 2018-06-09 DIAGNOSIS — Z1231 Encounter for screening mammogram for malignant neoplasm of breast: Secondary | ICD-10-CM

## 2018-07-21 ENCOUNTER — Ambulatory Visit
Admission: RE | Admit: 2018-07-21 | Discharge: 2018-07-21 | Disposition: A | Discharge status: Home / Self Care | Payer: Medicare Other | Source: Ambulatory Visit | Attending: Internal Medicine | Admitting: Internal Medicine

## 2018-07-21 DIAGNOSIS — Z1231 Encounter for screening mammogram for malignant neoplasm of breast: Secondary | ICD-10-CM | POA: Diagnosis not present

## 2018-07-22 ENCOUNTER — Encounter: Payer: Self-pay | Admitting: *Deleted

## 2018-08-01 ENCOUNTER — Ambulatory Visit (INDEPENDENT_AMBULATORY_CARE_PROVIDER_SITE_OTHER): Payer: Medicare Other

## 2018-08-01 ENCOUNTER — Ambulatory Visit (INDEPENDENT_AMBULATORY_CARE_PROVIDER_SITE_OTHER): Payer: Medicare Other | Admitting: Internal Medicine

## 2018-08-01 ENCOUNTER — Encounter: Payer: Self-pay | Admitting: Internal Medicine

## 2018-08-01 VITALS — BP 128/70 | HR 68 | Temp 97.7°F | Ht 65.0 in | Wt 172.0 lb

## 2018-08-01 DIAGNOSIS — F411 Generalized anxiety disorder: Secondary | ICD-10-CM | POA: Diagnosis not present

## 2018-08-01 DIAGNOSIS — Z0000 Encounter for general adult medical examination without abnormal findings: Secondary | ICD-10-CM

## 2018-08-01 DIAGNOSIS — M1731 Unilateral post-traumatic osteoarthritis, right knee: Secondary | ICD-10-CM

## 2018-08-01 DIAGNOSIS — Z6828 Body mass index (BMI) 28.0-28.9, adult: Secondary | ICD-10-CM

## 2018-08-01 DIAGNOSIS — E039 Hypothyroidism, unspecified: Secondary | ICD-10-CM | POA: Diagnosis not present

## 2018-08-01 DIAGNOSIS — E785 Hyperlipidemia, unspecified: Secondary | ICD-10-CM | POA: Diagnosis not present

## 2018-08-01 DIAGNOSIS — E663 Overweight: Secondary | ICD-10-CM

## 2018-08-01 DIAGNOSIS — Z23 Encounter for immunization: Secondary | ICD-10-CM

## 2018-08-01 DIAGNOSIS — F410 Panic disorder [episodic paroxysmal anxiety] without agoraphobia: Secondary | ICD-10-CM
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
## 2018-08-01 NOTE — Patient Instructions (Signed)
Patricia Cantu , Thank you for taking time to come for your Medicare Wellness Visit. I appreciate your ongoing commitment to your health goals. Please review the following plan we discussed and let me know if I can assist you in the future.   Screening recommendations/referrals: Colonoscopy last one completed 07/25/2014, repeat 5 years Mammogram last one completed 07/21/2018, due 07/21/2020 Bone Density up to date Recommended yearly ophthalmology/optometry visit for glaucoma screening and checkup Recommended yearly dental visit for hygiene and checkup  Vaccinations: Influenza vaccine high dose given today Pneumococcal vaccine up to date, completed Tdap vaccine up to date, due 03/15/2028 Shingles vaccine up to date, completed    Advanced directives: In chart  Conditions/risks identified: none  Next appointment: [name], RN 08/03/2019 @ 12:45pm   Preventive Care 65 Years and Older, Female Preventive care refers to lifestyle choices and visits with your health care provider that can promote health and wellness. What does preventive care include?  A yearly physical exam. This is also called an annual well check.  Dental exams once or twice a year.  Routine eye exams. Ask your health care provider how often you should have your eyes checked.  Personal lifestyle choices, including:  Daily care of your teeth and gums.  Regular physical activity.  Eating a healthy diet.  Avoiding tobacco and drug use.  Limiting alcohol use.  Practicing safe sex.  Taking low-dose aspirin every day.  Taking vitamin and mineral supplements as recommended by your health care provider. What happens during an annual well check? The services and screenings done by your health care provider during your annual well check will depend on your age, overall health, lifestyle risk factors, and family history of disease. Counseling  Your health care provider may ask you questions about your:  Alcohol  use.  Tobacco use.  Drug use.  Emotional well-being.  Home and relationship well-being.  Sexual activity.  Eating habits.  History of falls.  Memory and ability to understand (cognition).  Work and work Statistician.  Reproductive health. Screening  You may have the following tests or measurements:  Height, weight, and BMI.  Blood pressure.  Lipid and cholesterol levels. These may be checked every 5 years, or more frequently if you are over 42 years old.  Skin check.  Lung cancer screening. You may have this screening every year starting at age 48 if you have a 30-pack-year history of smoking and currently smoke or have quit within the past 15 years.  Fecal occult blood test (FOBT) of the stool. You may have this test every year starting at age 9.  Flexible sigmoidoscopy or colonoscopy. You may have a sigmoidoscopy every 5 years or a colonoscopy every 10 years starting at age 46.  Hepatitis C blood test.  Hepatitis B blood test.  Sexually transmitted disease (STD) testing.  Diabetes screening. This is done by checking your blood sugar (glucose) after you have not eaten for a while (fasting). You may have this done every 1-3 years.  Bone density scan. This is done to screen for osteoporosis. You may have this done starting at age 46.  Mammogram. This may be done every 1-2 years. Talk to your health care provider about how often you should have regular mammograms. Talk with your health care provider about your test results, treatment options, and if necessary, the need for more tests. Vaccines  Your health care provider may recommend certain vaccines, such as:  Influenza vaccine. This is recommended every year.  Tetanus, diphtheria,  and acellular pertussis (Tdap, Td) vaccine. You may need a Td booster every 10 years.  Zoster vaccine. You may need this after age 81.  Pneumococcal 13-valent conjugate (PCV13) vaccine. One dose is recommended after age  29.  Pneumococcal polysaccharide (PPSV23) vaccine. One dose is recommended after age 35. Talk to your health care provider about which screenings and vaccines you need and how often you need them. This information is not intended to replace advice given to you by your health care provider. Make sure you discuss any questions you have with your health care provider. Document Released: 10/18/2015 Document Revised: 06/10/2016 Document Reviewed: 07/23/2015 Elsevier Interactive Patient Education  2017 [employer] Prevention in the Home Falls can cause injuries. They can happen to people of all ages. There are many things you can do to make your home safe and to help prevent falls. What can I do on the outside of my home?  Regularly fix the edges of walkways and driveways and fix any cracks.  Remove anything that might make you trip as you walk through a door, such as a raised step or threshold.  Trim any bushes or trees on the path to your home.  Use bright outdoor lighting.  Clear any walking paths of anything that might make someone trip, such as rocks or tools.  Regularly check to see if handrails are loose or broken. Make sure that both sides of any steps have handrails.  Any raised decks and porches should have guardrails on the edges.  Have any leaves, snow, or ice cleared regularly.  Use sand or salt on walking paths during winter.  Clean up any spills in your garage right away. This includes oil or grease spills. What can I do in the bathroom?  Use night lights.  Install grab bars by the toilet and in the tub and shower. Do not use towel bars as grab bars.  Use non-skid mats or decals in the tub or shower.  If you need to sit down in the shower, use a plastic, non-slip stool.  Keep the floor dry. Clean up any water that spills on the floor as soon as it happens.  Remove soap buildup in the tub or shower regularly.  Attach bath mats securely with double-sided  non-slip rug tape.  Do not have throw rugs and other things on the floor that can make you trip. What can I do in the bedroom?  Use night lights.  Make sure that you have a light by your bed that is easy to reach.  Do not use any sheets or blankets that are too big for your bed. They should not hang down onto the floor.  Have a firm chair that has side arms. You can use this for support while you get dressed.  Do not have throw rugs and other things on the floor that can make you trip. What can I do in the kitchen?  Clean up any spills right away.  Avoid walking on wet floors.  Keep items that you use a lot in easy-to-reach places.  If you need to reach something above you, use a strong step stool that has a grab bar.  Keep electrical cords out of the way.  Do not use floor polish or wax that makes floors slippery. If you must use wax, use non-skid floor wax.  Do not have throw rugs and other things on the floor that can make you trip. What can I do with my  stairs?  Do not leave any items on the stairs.  Make sure that there are handrails on both sides of the stairs and use them. Fix handrails that are broken or loose. Make sure that handrails are as long as the stairways.  Check any carpeting to make sure that it is firmly attached to the stairs. Fix any carpet that is loose or worn.  Avoid having throw rugs at the top or bottom of the stairs. If you do have throw rugs, attach them to the floor with carpet tape.  Make sure that you have a light switch at the top of the stairs and the bottom of the stairs. If you do not have them, ask someone to add them for you. What else can I do to help prevent falls?  Wear shoes that:  Do not have high heels.  Have rubber bottoms.  Are comfortable and fit you well.  Are closed at the toe. Do not wear sandals.  If you use a stepladder:  Make sure that it is fully opened. Do not climb a closed stepladder.  Make sure that both  sides of the stepladder are locked into place.  Ask someone to hold it for you, if possible.  Clearly mark and make sure that you can see:  Any grab bars or handrails.  First and last steps.  Where the edge of each step is.  Use tools that help you move around (mobility aids) if they are needed. These include:  Canes.  Walkers.  Scooters.  Crutches.  Turn on the lights when you go into a dark area. Replace any light bulbs as soon as they burn out.  Set up your furniture so you have a clear path. Avoid moving your furniture around.  If any of your floors are uneven, fix them.  If there are any pets around you, be aware of where they are.  Review your medicines with your doctor. Some medicines can make you feel dizzy. This can increase your chance of falling. Ask your doctor what other things that you can do to help prevent falls. This information is not intended to replace advice given to you by your health care provider. Make sure you discuss any questions you have with your health care provider. Document Released: 07/18/2009 Document Revised: 02/27/2016 Document Reviewed: 10/26/2014 Elsevier Interactive Patient Education  2017 [employer].

## 2018-08-01 NOTE — Progress Notes (Addendum)
Subjective:   Patricia Cantu is a 77 y.o. female who presents for Medicare Annual (Subsequent) preventive examination.  Last AWV 12/17/2016    Objective:     Vitals: BP 128/70 (BP Location: Left Arm, Patient Position: Sitting)   Pulse 68   Temp 97.7 F (36.5 C) (Oral)   Ht 5\' 5"  (1.651 m)   Wt 172 lb (78 kg)   SpO2 97%   BMI 28.62 kg/m   Body mass index is 28.62 kg/m.  Advanced Directives 08/01/2018 04/26/2018 06/21/2017 04/02/2017 03/04/2017 12/17/2016 11/28/2015  Does Patient Have a Medical Advance Directive? Yes Yes Yes No Yes Yes Yes  Type of Advance Directive [place] will - Living will Living will Out of facility DNR (pink MOST or yellow form)  Does patient want to make changes to medical advance directive? No - Patient declined - - - - - No - Patient declined  Copy of [place] in Chart? Yes - - - - - Yes    Tobacco Social History   Tobacco Use  Smoking Status Never Smoker  Smokeless Tobacco Never Used     Counseling given: Not Answered   Clinical Intake:  Pre-visit preparation completed: No  Pain : No/denies pain     Diabetes: No  How often do you need to have someone help you when you read instructions, pamphlets, or other written materials from your doctor or pharmacy?: 1 - Never What is the last grade level you completed in school?: masters  Interpreter Needed?: No  Information entered by :: [name], RN  Past Medical History:  Diagnosis Date  . Acute perichondritis of pinna   . Allergic rhinitis due to pollen   . Disorder of bone and cartilage, unspecified   . Encounter for long-term (current) use of other medications   . History of echocardiogram    Echo 9/18: EF 65-70  . History of exercise stress test 06/2017   ETT 9/18: no ischemia; hypertensive BP response  . HLD (hyperlipidemia)   . Osteoarthritis   . Panic disorder   . Unspecified dermatitis due to sun   . Unspecified hypothyroidism      Past Surgical History:  Procedure Laterality Date  . BREAST BIOPSY Right 07/14/2016  . BREAST BIOPSY Right 07/15/2016  . cataract surgery Bilateral 10/12/2016  . childbirth     x2 "blocks"  . COLONOSCOPY     x2  . TONSILLECTOMY  1949   removed   Family History  Problem Relation Age of Onset  . Stroke Mother   . Heart failure Mother 23  . Hypertension Mother   . Heart attack Father 25  . Hyperlipidemia Sister   . Atrial fibrillation Sister   . Thyroid disease Sister   . Heart disease Son   . Thyroid disease Son   . Thyroid disease Daughter   . Atrial fibrillation Brother   . Breast cancer Maternal Grandmother 41  . Colon cancer Neg Hx    Social History   Socioeconomic History  . Marital status: Married    Spouse name: Not on file  . Number of children: Not on file  . Years of education: Not on file  . Highest education level: Not on file  Occupational History  . Occupation: retired  Scientific laboratory technician  . Financial resource strain: Not hard at all  . Food insecurity:    Worry: Never true    Inability: Never true  . Transportation needs:  Medical: No    Non-medical: No  Tobacco Use  . Smoking status: Never Smoker  . Smokeless tobacco: Never Used  Substance and Sexual Activity  . Alcohol use: Yes    Alcohol/week: 2.0 standard drinks    Types: 2 Glasses of wine per week  . Drug use: No  . Sexual activity: Yes  Lifestyle  . Physical activity:    Days per week: 5 days    Minutes per session: 30 min  . Stress: Not at all  Relationships  . Social connections:    Talks on phone: More than three times a week    Gets together: More than three times a week    Attends religious service: More than 4 times per year    Active member of club or organization: Yes    Attends meetings of clubs or organizations: More than 4 times per year    Relationship status: Married  Other Topics Concern  . Not on file  Social History Narrative   She is retired. 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 She is married and has 3 children and 6 grandchildren. She is originally from [place]. She has lived in [place]. She lived in [place] for 7 years. She denies tobacco abuse. She drinks alcohol on occasion.    Outpatient Encounter Medications as of 08/01/2018  Medication Sig  . ALPRAZolam (XANAX) 0.25 MG tablet Take 1 tablet (0.25 mg total) by mouth 2 (two) times daily as needed (panic attacks).  Marland Kitchen atorvastatin (LIPITOR) 10 MG tablet TAKE 1 TABLET BY MOUTH DAILY FOR CHOLESTEROL  . CALCIUM PO Take 1 tablet by mouth daily.  . cholecalciferol (VITAMIN D) 1000 UNITS tablet Take 2,000 Units by mouth daily. Take 2 tablets daily  . citalopram (CELEXA) 20 MG tablet TAKE 1 TABLET BY MOUTH DAILY.  Marland Kitchen diclofenac sodium (VOLTAREN) 1 % GEL Apply 4 g topically 4 (four) times daily. To affected knee  . fish oil-omega-3 fatty acids 1000 MG capsule Take 2 g by mouth daily. Take one tablet once a day.  . fluticasone (FLONASE) 50 MCG/ACT nasal spray Place 1 spray into both nostrils daily.  Marland Kitchen ibuprofen (ADVIL,MOTRIN) 200 MG tablet Take 200 mg by mouth every 6 (six) hours as needed for moderate pain.  Marland Kitchen levothyroxine (SYNTHROID, LEVOTHROID) 50 MCG tablet TAKE 1 TABLET BY MOUTH ONCE DAILY 30 MINUTES PRIOR TO BREAKFAST FOR THYROID  . loratadine (CLARITIN) 10 MG tablet Take 10 mg by mouth daily.  . Multiple Vitamin (MULTIVITAMIN) tablet Take 1 tablet by mouth daily.   Marland Kitchen nystatin cream (MYCOSTATIN) Apply 1 application topically 2 (two) times daily. Under breast  . triamcinolone cream (KENALOG) 0.1 % Apply 1 application topically daily as needed (itching).    No facility-administered encounter medications on file as of 08/01/2018.     Activities of Daily Living In your present state of health, do you have any difficulty performing the following activities: 08/01/2018  Hearing? N  Vision? N  Difficulty concentrating or making  decisions? N  Walking or climbing stairs? N  Dressing or bathing? N  Doing errands, shopping? N  Preparing Food and eating ? N  Using the Toilet? N  In the past six months, have you accidently leaked urine? N  Comment leaking, wears pads  Do you have problems with loss of bowel control? N  Managing your Medications? N  Managing your Finances? N  Housekeeping or managing your  Housekeeping? N  Some recent data might be hidden    Patient Care Team: Gayland Curry, DO as PCP - General (Geriatric Medicine)    Assessment:   This is a routine wellness examination for Patricia Cantu.  Exercise Activities and Dietary recommendations Current Exercise Habits: Home exercise routine;Structured exercise class, Type of exercise: walking;strength training/weights;Other - see comments(golfing), Time (Minutes): 30, Frequency (Times/Week): 5, Weekly Exercise (Minutes/Week): 150, Intensity: Mild, Exercise limited by: None identified  Goals   None     Fall Risk Fall Risk  08/01/2018 05/30/2018 04/26/2018 01/27/2018 12/23/2017  Falls in the past year? No No No No No   Is the patient's home free of loose throw rugs in walkways, pet beds, electrical cords, etc?   yes      Grab bars in the bathroom? yes      Handrails on the stairs?   yes      Adequate lighting?   yes  Depression Screen PHQ 2/9 Scores 08/01/2018 05/30/2018 06/21/2017 04/12/2017  PHQ - 2 Score 0 0 0 0     Cognitive Function MMSE - Mini Mental State Exam 08/01/2018 12/17/2016 11/28/2015  Not completed: - - (No Data)  Orientation to time 5 5 5   Orientation to Place 5 5 5   Registration 3 3 3   Attention/ Calculation 5 5 5   Recall 3 3 2   Language- name 2 objects 2 2 2   Language- repeat 1 1 1   Language- follow 3 step command 3 3 3   Language- read & follow direction 1 1 1   Write a sentence 1 1 1   Copy design 1 1 1   Total score 30 30 29         Immunization History  Administered Date(s) Administered  . Hepatitis A 02/17/2013  .  Hepatitis A, Adult 01/18/2014  . Hepatitis B 07/22/2006, 08/24/2006, 01/21/2007  . Influenza, High Dose Seasonal PF 06/21/2017, 08/01/2018  . Influenza,inj,Quad PF,6+ Mos 06/15/2013, 08/03/2014, 07/26/2015, 05/28/2016  . Pneumococcal Conjugate-13 11/29/2014  . Pneumococcal-Unspecified 10/05/2009  . Td 10/06/2007  . Tdap 03/15/2018  . Zoster Recombinat (Shingrix) 02/02/2018, 04/25/2018    Qualifies for Shingles Vaccine? Up to date, completed  Screening Tests Health Maintenance  Topic Date Due  . TETANUS/TDAP  03/15/2028  . INFLUENZA VACCINE  Completed  . DEXA SCAN  Completed  . PNA vac Low Risk Adult  Completed    Cancer Screenings: Lung: Low Dose CT Chest recommended if Age 43-80 years, 30 pack-year currently smoking OR have quit w/in 15years. Patient does not qualify. Breast:  Up to date on Mammogram? Yes   Up to date of Bone Density/Dexa? Yes Colorectal: up to date  Additional Screenings:  Hepatitis C Screening: declined High dose flu vaccine due-given     Plan:  I have personally reviewed and addressed the Medicare Annual Wellness questionnaire and have noted the following in the patient's chart:  A. Medical and social history B. Use of alcohol, tobacco or illicit drugs  C. Current medications and supplements D. Functional ability and status E.  Nutritional status F.  Physical activity G. Advance directives H. List of other physicians I.  Hospitalizations, surgeries, and ER visits in previous 12 months J.  [place] to include hearing, vision, cognitive, depression L. Referrals and appointments - none  In addition, I have reviewed and discussed with patient certain preventive protocols, quality metrics, and best practice recommendations. A written personalized care plan for preventive services as well as general preventive health recommendations were provided to patient.  See attached scanned questionnaire for additional information.   Signed,    [name], RN Nurse Health Advisor  Patient Concerns: None

## 2018-08-24 ENCOUNTER — Other Ambulatory Visit: Payer: Self-pay | Admitting: Internal Medicine

## 2018-08-25 ENCOUNTER — Ambulatory Visit (INDEPENDENT_AMBULATORY_CARE_PROVIDER_SITE_OTHER): Payer: Medicare Other | Admitting: Internal Medicine

## 2018-08-25 ENCOUNTER — Encounter: Payer: Self-pay | Admitting: Internal Medicine

## 2018-08-25 VITALS — BP 130/80 | HR 61 | Temp 98.1°F | Ht 65.0 in | Wt 175.0 lb

## 2018-08-25 DIAGNOSIS — G8929 Other chronic pain: Secondary | ICD-10-CM

## 2018-08-25 DIAGNOSIS — M25562 Pain in left knee: Secondary | ICD-10-CM

## 2018-08-25 DIAGNOSIS — M25561 Pain in right knee: Secondary | ICD-10-CM

## 2018-08-25 NOTE — Patient Instructions (Signed)
Fat and Cholesterol Restricted Diet Getting too much fat and cholesterol in your diet may cause health problems. Following this diet helps keep your fat and cholesterol at normal levels. This can keep you from getting sick. What types of fat should I choose?  Choose monosaturated and polyunsaturated fats. These are found in foods such as olive oil, canola oil, flaxseeds, walnuts, almonds, and seeds.  Eat more omega-3 fats. Good choices include salmon, mackerel, sardines, tuna, flaxseed oil, and ground flaxseeds.  Limit saturated fats. These are in animal products such as meats, butter, and cream. They can also be in plant products such as palm oil, palm kernel oil, and coconut oil.  Avoid foods with partially hydrogenated oils in them. These contain trans fats. Examples of foods that have trans fats are stick margarine, some tub margarines, cookies, crackers, and other baked goods. What general guidelines do I need to follow?  Check food labels. Look for the words "trans fat" and "saturated fat."  When preparing a meal: ? Fill half of your plate with vegetables and green salads. ? Fill one fourth of your plate with whole grains. Look for the word "whole" as the first word in the ingredient list. ? Fill one fourth of your plate with lean protein foods.  Eat more foods that have fiber, like apples, carrots, beans, peas, and barley.  Eat more home-cooked foods. Eat less at restaurants and buffets.  Limit or avoid alcohol.  Limit foods high in starch and sugar.  Limit fried foods.  Cook foods without frying them. Baking, boiling, grilling, and broiling are all great options.  Lose weight if you are overweight. Losing even a small amount of weight can help your overall health. It can also help prevent diseases such as diabetes and heart disease. What foods can I eat? Grains Whole grains, such as whole wheat or whole grain breads, crackers, cereals, and pasta. Unsweetened oatmeal,  bulgur, barley, quinoa, or brown rice. Corn or whole wheat flour tortillas. Vegetables Fresh or frozen vegetables (raw, steamed, roasted, or grilled). Green salads. Fruits All fresh, canned (in natural juice), or frozen fruits. Meat and Other Protein Products Ground beef (85% or leaner), grass-fed beef, or beef trimmed of fat. Skinless chicken or turkey. Ground chicken or turkey. Pork trimmed of fat. All fish and seafood. Eggs. Dried beans, peas, or lentils. Unsalted nuts or seeds. Unsalted canned or dry beans. Dairy Low-fat dairy products, such as skim or 1% milk, 2% or reduced-fat cheeses, low-fat ricotta or cottage cheese, or plain low-fat yogurt. Fats and Oils Tub margarines without trans fats. Light or reduced-fat mayonnaise and salad dressings. Avocado. Olive, canola, sesame, or safflower oils. Natural peanut or almond butter (choose ones without added sugar and oil). The items listed above may not be a complete list of recommended foods or beverages. Contact your dietitian for more options. What foods are not recommended? Grains White bread. White pasta. White rice. Cornbread. Bagels, pastries, and croissants. Crackers that contain trans fat. Vegetables White potatoes. Corn. Creamed or fried vegetables. Vegetables in a cheese sauce. Fruits Dried fruits. Canned fruit in light or heavy syrup. Fruit juice. Meat and Other Protein Products Fatty cuts of meat. Ribs, chicken wings, bacon, sausage, bologna, salami, chitterlings, fatback, hot dogs, bratwurst, and packaged luncheon meats. Liver and organ meats. Dairy Whole or 2% milk, cream, half-and-half, and cream cheese. Whole milk cheeses. Whole-fat or sweetened yogurt. Full-fat cheeses. Nondairy creamers and whipped toppings. Processed cheese, cheese spreads, or cheese curds. Sweets and Desserts Corn   syrup, sugars, honey, and molasses. Candy. Jam and jelly. Syrup. Sweetened cereals. Cookies, pies, cakes, donuts, muffins, and ice  cream. Fats and Oils Butter, stick margarine, lard, shortening, ghee, or bacon fat. Coconut, palm kernel, or palm oils. Beverages Alcohol. Sweetened drinks (such as sodas, lemonade, and fruit drinks or punches). The items listed above may not be a complete list of foods and beverages to avoid. Contact your dietitian for more information. This information is not intended to replace advice given to you by your health care provider. Make sure you discuss any questions you have with your health care provider. Document Released: 03/22/2012 Document Revised: 05/28/2016 Document Reviewed: 12/21/2013 Elsevier Interactive Patient Education  2018 Elsevier Inc.  

## 2018-08-25 NOTE — Progress Notes (Signed)
Location:  [facility] clinic Provider: Julina Altmann L. Mariea Clonts, D.O., C.M.D.  Goals of Care:  Advanced Directives 08/01/2018  Does Patient Have a Medical Advance Directive? Yes  Type of Advance Directive [place]  Does patient want to make changes to medical advance directive? No - Patient declined  Copy of [place] in Chart? Yes   Chief Complaint  Patient presents with  . Acute Visit    left knee pain, asking for referral    HPI: Patient is a 77 y.o. female with h/o being overweight, anxiety, hypothyroidism seen today for an acute visit for bilateral knee pain.  She had a prior right knee injury when she was young and the knee clicks since.  The left has become painful both medially and laterally now.  She's been even more active than usual recently with painting her huge fence. She also had tried swimming and made the mistake of swimming breast stroke which made both her back and her knees hurt.  She since changed to water exercises and free stroke.  She continues to golf and do her yardwork.  She's been using some tylenol for pain.  Reviewed max is 3g per day.  She's nowhere near that.  She has not been using the voltaren gel b/c she found it hard to believe it would help.  She is willing to give it a try after I explained its' benefits to her.  We also discussed long term negative effects of oral nsaids (short term rare use post injury ok).  She has had friends who see Dr. Lynann Bologna and would like to be referred to her for further evaluation and management.    Past Medical History:  Diagnosis Date  . Acute perichondritis of pinna   . Allergic rhinitis due to pollen   . Disorder of bone and cartilage, unspecified   . Encounter for long-term (current) use of other medications   . History of echocardiogram    Echo 9/18: EF 65-70  . History of exercise stress test 06/2017   ETT 9/18: no ischemia; hypertensive BP response  . HLD (hyperlipidemia)   .  Osteoarthritis   . Panic disorder   . Unspecified dermatitis due to sun   . Unspecified hypothyroidism     Past Surgical History:  Procedure Laterality Date  . BREAST BIOPSY Right 07/14/2016  . BREAST BIOPSY Right 07/15/2016  . cataract surgery Bilateral 10/12/2016  . childbirth     x2 "blocks"  . COLONOSCOPY     x2  . TONSILLECTOMY  1949   removed    No Known Allergies  Outpatient Encounter Medications as of 08/25/2018  Medication Sig  . ALPRAZolam (XANAX) 0.25 MG tablet Take 1 tablet (0.25 mg total) by mouth 2 (two) times daily as needed (panic attacks).  Marland Kitchen atorvastatin (LIPITOR) 10 MG tablet TAKE 1 TABLET BY MOUTH DAILY FOR CHOLESTEROL  . CALCIUM PO Take 1 tablet by mouth daily.  . cholecalciferol (VITAMIN D) 1000 UNITS tablet Take 2,000 Units by mouth daily. Take 2 tablets daily  . citalopram (CELEXA) 20 MG tablet TAKE 1 TABLET BY MOUTH DAILY.  Marland Kitchen diclofenac sodium (VOLTAREN) 1 % GEL Apply 4 g topically 4 (four) times daily. To affected knee  . fish oil-omega-3 fatty acids 1000 MG capsule Take 2 g by mouth daily. Take one tablet once a day.  . fluticasone (FLONASE) 50 MCG/ACT nasal spray Place 1 spray into both nostrils daily.  Marland Kitchen ibuprofen (ADVIL,MOTRIN) 200 MG tablet Take 200  mg by mouth every 6 (six) hours as needed for moderate pain.  Marland Kitchen levothyroxine (SYNTHROID, LEVOTHROID) 50 MCG tablet TAKE 1 TABLET BY MOUTH ONCE DAILY 30 MINUTES PRIOR TO BREAKFAST FOR THYROID  . loratadine (CLARITIN) 10 MG tablet Take 10 mg by mouth daily.  . Multiple Vitamin (MULTIVITAMIN) tablet Take 1 tablet by mouth daily.   Marland Kitchen nystatin cream (MYCOSTATIN) Apply 1 application topically 2 (two) times daily. Under breast  . triamcinolone cream (KENALOG) 0.1 % Apply 1 application topically daily as needed (itching).    No facility-administered encounter medications on file as of 08/25/2018.     Review of Systems:  Review of Systems  Constitutional: Negative for chills and fever.  HENT: Negative for  congestion.   Eyes: Negative for blurred vision.  Respiratory: Negative for shortness of breath.   Cardiovascular: Negative for chest pain, palpitations and leg swelling.  Gastrointestinal: Negative for abdominal pain.  Genitourinary: Negative for dysuria.  Musculoskeletal: Positive for joint pain. Negative for falls.  Neurological: Negative for dizziness and loss of consciousness.  Psychiatric/Behavioral: Negative for depression and memory loss. The patient is nervous/anxious. The patient does not have insomnia.     Health Maintenance  Topic Date Due  . TETANUS/TDAP  03/15/2028  . INFLUENZA VACCINE  Completed  . DEXA SCAN  Completed  . PNA vac Low Risk Adult  Completed    Physical Exam: Vitals:   08/25/18 0916  BP: 130/80  Pulse: 61  Temp: 98.1 F (36.7 C)  TempSrc: Oral  SpO2: 97%  Weight: 175 lb (79.4 kg)  Height: 5\' 5"  (1.651 m)   Body mass index is 29.12 kg/m. Physical Exam  Constitutional: She is oriented to person, place, and time. She appears well-developed and well-nourished. No distress.  Cardiovascular: Normal rate, regular rhythm, normal heart sounds and intact distal pulses.  Pulmonary/Chest: Effort normal and breath sounds normal. No respiratory distress.  Musculoskeletal: Normal range of motion. She exhibits tenderness.  Tender today just over proximal lateral aspect of left knee; no visible effusion, erythema or warmth; crepitus in right knee, but no tenderness, effusion, erythema or warmth; normal ROM; no deformity  Neurological: She is alert and oriented to person, place, and time.  Skin: Skin is warm and dry.  Psychiatric: She has a normal mood and affect.    Labs reviewed: Basic Metabolic Panel: Recent Labs    01/27/18 1038  NA 138  K 4.8  CL 103  CO2 30  GLUCOSE 93  BUN 20  CREATININE 1.02*  CALCIUM 9.4  TSH 1.27   Liver Function Tests: Recent Labs    01/27/18 1038  AST 21  ALT 13  BILITOT 0.9  PROT 6.3   No results for  input(s): LIPASE, AMYLASE in the last 8760 hours. No results for input(s): AMMONIA in the last 8760 hours. CBC: Recent Labs    01/27/18 1038  WBC 5.7  NEUTROABS 3,654  HGB 13.5  HCT 39.5  MCV 91.0  PLT 263   Lipid Panel: Recent Labs    01/27/18 1038  CHOL 138  HDL 60  LDLCALC 62  TRIG 75  CHOLHDL 2.3   Lab Results  Component Value Date   HGBA1C 5.4 06/21/2017    Assessment/Plan 1. Chronic pain of both knees - has been working on losing weight -cont tylenol use, discouraged regular nsaids orally, encouraged voltaren gel, referral entered at her request to Dr. Lynann Bologna for further investigation--suspect some post traumatic right knee OA and ordinary left knee OA; does not  have signs of autoimmune disease outside of her hypothyroidism - AMB referral to orthopedics  Labs/tests ordered:   Orders Placed This Encounter  Procedures  . AMB referral to orthopedics    Referral Priority:   Routine    Referral Type:   Consultation    Referred to Provider:   Almedia Balls, MD    Number of Visits Requested:   1    Next appt:  Keep June appt and prn  Jaskarn Schweer L. Cortlandt Capuano, D.O. [facility] [address], [place] [address] Cell Phone (Mon-Fri 8am-5pm):  [phone] On Call:  [phone] & follow prompts after 5pm & weekends Office Phone:  [phone] Office Fax:  [phone]

## 2018-09-05 DIAGNOSIS — M170 Bilateral primary osteoarthritis of knee: Secondary | ICD-10-CM | POA: Diagnosis not present

## 2018-10-12 ENCOUNTER — Encounter: Payer: Self-pay | Admitting: Internal Medicine

## 2018-10-31 ENCOUNTER — Other Ambulatory Visit: Payer: Self-pay | Admitting: Nurse Practitioner

## 2018-10-31 DIAGNOSIS — F411 Generalized anxiety disorder: Principal | ICD-10-CM

## 2018-10-31 DIAGNOSIS — F410 Panic disorder [episodic paroxysmal anxiety] without agoraphobia: Secondary | ICD-10-CM

## 2018-10-31 NOTE — Telephone Encounter (Signed)
High drug interaction with Ibuprofen

## 2018-11-28 ENCOUNTER — Other Ambulatory Visit: Payer: Self-pay | Admitting: Internal Medicine

## 2018-11-30 ENCOUNTER — Telehealth: Payer: Self-pay | Admitting: *Deleted

## 2018-11-30 NOTE — Telephone Encounter (Signed)
Patient called and stated that she would like for you to put out your recommendations of what patient's should have in their home, Precautions and things to expect for the "virus" that might hit the [place]. Stated she values your input.  Patient stated that she would like your intake on this. Please Advise.

## 2018-12-06 NOTE — Telephone Encounter (Signed)
I recommend #1, best handwashing and avoiding touching the face and nose. In the event that this reaches pandemic level, it's recommended that you are prepared in advance with at least a week of pantry items that you could prepare meals with to avoid leaving the home. Masks are not recommended as primary prevention at this point.   I hope that helps.  If she has other specific questions, let me know.

## 2018-12-06 NOTE — Telephone Encounter (Signed)
Patient notified and agreed.  

## 2018-12-10 ENCOUNTER — Encounter: Payer: Self-pay | Admitting: Internal Medicine

## 2019-01-30 ENCOUNTER — Telehealth: Payer: Self-pay | Admitting: *Deleted

## 2019-01-30 NOTE — Telephone Encounter (Signed)
Patient called and stated that she read it was good to have a pulse Ox around. Patient wants to know what you think. Stated that she will purchase one if you think it is a good idea to have one. Please Advise.

## 2019-01-31 NOTE — Telephone Encounter (Signed)
It is a good item in terms of monitoring for covid-19 because oxygen saturations for some patients decline before other notable symptoms develop.  They are also quite affordable so it's not a bad idea to get one.  Plus, we are moving toward more patient self-monitoring over time.

## 2019-01-31 NOTE — Telephone Encounter (Signed)
Patient husband notified and agreed and stated that she has already ordered one.

## 2019-02-20 DIAGNOSIS — H5211 Myopia, right eye: Secondary | ICD-10-CM | POA: Diagnosis not present

## 2019-02-20 DIAGNOSIS — Z9841 Cataract extraction status, right eye: Secondary | ICD-10-CM | POA: Diagnosis not present

## 2019-02-20 DIAGNOSIS — Z9842 Cataract extraction status, left eye: Secondary | ICD-10-CM | POA: Diagnosis not present

## 2019-03-06 ENCOUNTER — Other Ambulatory Visit: Payer: Self-pay

## 2019-03-06 DIAGNOSIS — E039 Hypothyroidism, unspecified: Secondary | ICD-10-CM

## 2019-03-06 DIAGNOSIS — E785 Hyperlipidemia, unspecified: Secondary | ICD-10-CM

## 2019-03-06 DIAGNOSIS — Z6828 Body mass index (BMI) 28.0-28.9, adult: Secondary | ICD-10-CM

## 2019-03-06 DIAGNOSIS — E663 Overweight: Secondary | ICD-10-CM

## 2019-03-07 ENCOUNTER — Other Ambulatory Visit: Payer: Medicare Other

## 2019-03-07 ENCOUNTER — Other Ambulatory Visit: Payer: Self-pay

## 2019-03-07 DIAGNOSIS — Z6828 Body mass index (BMI) 28.0-28.9, adult: Secondary | ICD-10-CM | POA: Diagnosis not present

## 2019-03-07 DIAGNOSIS — E785 Hyperlipidemia, unspecified: Secondary | ICD-10-CM | POA: Diagnosis not present

## 2019-03-07 DIAGNOSIS — E663 Overweight: Secondary | ICD-10-CM | POA: Diagnosis not present

## 2019-03-07 DIAGNOSIS — E039 Hypothyroidism, unspecified: Secondary | ICD-10-CM | POA: Diagnosis not present

## 2019-03-08 LAB — COMPLETE METABOLIC PANEL WITH GFR
AG Ratio: 1.7 (calc) (ref 1.0–2.5)
ALT: 13 U/L (ref 6–29)
AST: 20 U/L (ref 10–35)
Albumin: 4 g/dL (ref 3.6–5.1)
Alkaline phosphatase (APISO): 41 U/L (ref 37–153)
BUN/Creatinine Ratio: 21 (calc) (ref 6–22)
BUN: 21 mg/dL (ref 7–25)
CO2: 27 mmol/L (ref 20–32)
Calcium: 9.1 mg/dL (ref 8.6–10.4)
Chloride: 103 mmol/L (ref 98–110)
Creat: 1.01 mg/dL — ABNORMAL HIGH (ref 0.60–0.93)
GFR, Est African American: 63 mL/min/{1.73_m2} (ref >=60)
GFR, Est Non African American: 54 mL/min/{1.73_m2} — ABNORMAL LOW (ref >=60)
Globulin: 2.3 g/dL (calc) (ref 1.9–3.7)
Glucose, Bld: 89 mg/dL (ref 65–139)
Potassium: 4.6 mmol/L (ref 3.5–5.3)
Sodium: 137 mmol/L (ref 135–146)
Total Bilirubin: 0.7 mg/dL (ref 0.2–1.2)
Total Protein: 6.3 g/dL (ref 6.1–8.1)

## 2019-03-08 LAB — CBC WITH DIFFERENTIAL/PLATELET
Absolute Monocytes: 512 cells/uL (ref 200–950)
Basophils Absolute: 61 cells/uL (ref 0–200)
Basophils Relative: 1.3 %
Eosinophils Absolute: 263 cells/uL (ref 15–500)
Eosinophils Relative: 5.6 %
HCT: 40.1 % (ref 35.0–45.0)
Hemoglobin: 13.1 g/dL (ref 11.7–15.5)
Lymphs Abs: 1889 cells/uL (ref 850–3900)
MCH: 31.1 pg (ref 27.0–33.0)
MCHC: 32.7 g/dL (ref 32.0–36.0)
MCV: 95.2 fL (ref 80.0–100.0)
MPV: 10.7 fL (ref 7.5–12.5)
Monocytes Relative: 10.9 %
Neutro Abs: 1974 cells/uL (ref 1500–7800)
Neutrophils Relative %: 42 %
Platelets: 252 10*3/uL (ref 140–400)
RBC: 4.21 10*6/uL (ref 3.80–5.10)
RDW: 12.9 % (ref 11.0–15.0)
Total Lymphocyte: 40.2 %
WBC: 4.7 10*3/uL (ref 3.8–10.8)

## 2019-03-08 LAB — LIPID PANEL
Cholesterol: 157 mg/dL (ref <200)
HDL: 63 mg/dL (ref >=50)
LDL Cholesterol (Calc): 76 mg/dL (calc)
Non-HDL Cholesterol (Calc): 94 mg/dL (calc) (ref <130)
Total CHOL/HDL Ratio: 2.5 (calc) (ref <5.0)
Triglycerides: 101 mg/dL (ref <150)

## 2019-03-08 LAB — HEMOGLOBIN A1C
Hgb A1c MFr Bld: 5.2 % of total Hgb (ref <5.7)
Mean Plasma Glucose: 103 (calc)
eAG (mmol/L): 5.7 (calc)

## 2019-03-08 LAB — TSH: TSH: 1.49 mIU/L (ref 0.40–4.50)

## 2019-03-09 ENCOUNTER — Ambulatory Visit (INDEPENDENT_AMBULATORY_CARE_PROVIDER_SITE_OTHER): Payer: Medicare Other | Admitting: Internal Medicine

## 2019-03-09 ENCOUNTER — Other Ambulatory Visit: Payer: Self-pay

## 2019-03-09 ENCOUNTER — Encounter: Payer: Self-pay | Admitting: Internal Medicine

## 2019-03-09 VITALS — BP 138/70 | HR 63 | Temp 97.9°F | Ht 65.0 in | Wt 179.0 lb

## 2019-03-09 DIAGNOSIS — Z0000 Encounter for general adult medical examination without abnormal findings: Secondary | ICD-10-CM | POA: Diagnosis not present

## 2019-03-09 DIAGNOSIS — E785 Hyperlipidemia, unspecified: Secondary | ICD-10-CM | POA: Diagnosis not present

## 2019-03-09 DIAGNOSIS — Z6829 Body mass index (BMI) 29.0-29.9, adult: Secondary | ICD-10-CM

## 2019-03-09 DIAGNOSIS — E039 Hypothyroidism, unspecified: Secondary | ICD-10-CM | POA: Diagnosis not present

## 2019-03-09 DIAGNOSIS — E663 Overweight: Secondary | ICD-10-CM

## 2019-03-09 DIAGNOSIS — F411 Generalized anxiety disorder: Secondary | ICD-10-CM

## 2019-03-09 DIAGNOSIS — F410 Panic disorder [episodic paroxysmal anxiety] without agoraphobia: Secondary | ICD-10-CM

## 2019-03-09 NOTE — Progress Notes (Signed)
Provider:  Rexene Edison. Mariea Clonts, D.O., C.M.D. Location:      Place of Service:     Previous PCP: Gayland Curry, DO Patient Care Team: Gayland Curry, DO as PCP - General (Geriatric Medicine)  Extended Emergency Contact Information Primary Emergency Contact: Crisanto,Kenneth Address: [address]          [place], [place] [address] [name] of [place] Phone: [phone] Relation: Spouse  Code Status: DNR Goals of Care: Advanced Directive information Advanced Directives 08/01/2018  Does Patient Have a Medical Advance Directive? Yes  Type of Advance Directive [place]  Does patient want to make changes to medical advance directive? No - Patient declined  Copy of [place] in Chart? Yes   Chief Complaint  Patient presents with  . Annual Exam    CPE    HPI: Patient is a 78 y.o. female seen today for an annual physical exam.  Other than covid, it's nice that life has slowed down.  She's played a lot more golf and she's been doing zoom meetings.  We reviewed importance of hydration in the heat.    They've enjoyed eating more--there've been some pecan rolls and cookies baked and eaten.  BMI up a little.  Past Medical History:  Diagnosis Date  . Acute perichondritis of pinna   . Allergic rhinitis due to pollen   . Disorder of bone and cartilage, unspecified   . Encounter for long-term (current) use of other medications   . History of echocardiogram    Echo 9/18: EF 65-70  . History of exercise stress test 06/2017   ETT 9/18: no ischemia; hypertensive BP response  . HLD (hyperlipidemia)   . Osteoarthritis   . Panic disorder   . Unspecified dermatitis due to sun   . Unspecified hypothyroidism    Past Surgical History:  Procedure Laterality Date  . BREAST BIOPSY Right 07/14/2016  . BREAST BIOPSY Right 07/15/2016  . cataract surgery Bilateral 10/12/2016  . childbirth     x2 "blocks"  . COLONOSCOPY     x2  . TONSILLECTOMY   1949   removed    reports that she has never smoked. She has never used smokeless tobacco. She reports current alcohol use of about 2.0 standard drinks of alcohol per week. She reports that she does not use drugs.  Functional Status Survey:    Family History  Problem Relation Age of Onset  . Stroke Mother   . Heart failure Mother 76  . Hypertension Mother   . Heart attack Father 69  . Hyperlipidemia Sister   . Atrial fibrillation Sister   . Thyroid disease Sister   . Heart disease Son   . Thyroid disease Son   . Thyroid disease Daughter   . Atrial fibrillation Brother   . Breast cancer Maternal Grandmother 14  . Colon cancer Neg Hx     Health Maintenance  Topic Date Due  . INFLUENZA VACCINE  05/06/2019  . TETANUS/TDAP  03/15/2028  . DEXA SCAN  Completed  . PNA vac Low Risk Adult  Completed    No Known Allergies  Outpatient Encounter Medications as of 03/09/2019  Medication Sig  . ALPRAZolam (XANAX) 0.25 MG tablet Take 1 tablet (0.25 mg total) by mouth 2 (two) times daily as needed (panic attacks).  Marland Kitchen atorvastatin (LIPITOR) 10 MG tablet TAKE 1 TABLET BY MOUTH DAILY FOR CHOLESTEROL  . CALCIUM PO Take 1 tablet by mouth daily.  . cholecalciferol (VITAMIN D)  1000 UNITS tablet Take 2,000 Units by mouth daily. Take 2 tablets daily  . citalopram (CELEXA) 20 MG tablet TAKE 1 TABLET BY MOUTH DAILY.  Marland Kitchen diclofenac sodium (VOLTAREN) 1 % GEL Apply 4 g topically 4 (four) times daily. To affected knee  . fish oil-omega-3 fatty acids 1000 MG capsule Take 2 g by mouth daily. Take one tablet once a day.  . fluticasone (FLONASE) 50 MCG/ACT nasal spray Place 1 spray into both nostrils daily.  Marland Kitchen ibuprofen (ADVIL,MOTRIN) 200 MG tablet Take 200 mg by mouth every 6 (six) hours as needed for moderate pain.  Marland Kitchen levothyroxine (SYNTHROID, LEVOTHROID) 50 MCG tablet TAKE 1 TABLET BY MOUTH ONCE DAILY 30 MINUTES PRIOR TO BREAKFAST FOR THYROID  . loratadine (CLARITIN) 10 MG tablet Take 10 mg by mouth  daily.  . Multiple Vitamin (MULTIVITAMIN) tablet Take 1 tablet by mouth daily.   Marland Kitchen nystatin cream (MYCOSTATIN) Apply 1 application topically 2 (two) times daily. Under breast  . triamcinolone cream (KENALOG) 0.1 % Apply 1 application topically daily as needed (itching).    No facility-administered encounter medications on file as of 03/09/2019.     Review of Systems  Constitutional: Negative for chills, fever and malaise/fatigue.  HENT: Negative for congestion and hearing loss.   Eyes: Negative for blurred vision.  Respiratory: Negative for cough and shortness of breath.   Cardiovascular: Negative for chest pain, palpitations and leg swelling.  Gastrointestinal: Negative for abdominal pain, blood in stool, constipation and diarrhea.  Genitourinary: Negative for dysuria.  Musculoskeletal: Positive for joint pain. Negative for falls.       Knees doing well  Skin: Negative for itching and rash.  Neurological: Negative for dizziness, sensory change and loss of consciousness.  Endo/Heme/Allergies: Does not bruise/bleed easily.  Psychiatric/Behavioral: Negative for depression and memory loss. The patient is nervous/anxious. The patient does not have insomnia.     Vitals:   03/09/19 0901  BP: 138/70  Pulse: 63  Temp: 97.9 F (36.6 C)  TempSrc: Oral  SpO2: 97%  Weight: 179 lb (81.2 kg)  Height: 5\' 5"  (1.651 m)   Body mass index is 29.79 kg/m. Physical Exam Vitals signs reviewed.  Constitutional:      Appearance: Normal appearance.  HENT:     Head: Normocephalic and atraumatic.     Right Ear: Tympanic membrane, ear canal and external ear normal.     Left Ear: Tympanic membrane, ear canal and external ear normal.     Nose: Nose normal.     Mouth/Throat:     Mouth: Mucous membranes are moist.     Pharynx: Oropharynx is clear.  Eyes:     Extraocular Movements: Extraocular movements intact.     Conjunctiva/sclera: Conjunctivae normal.     Pupils: Pupils are equal, round, and  reactive to light.  Neck:     Musculoskeletal: Neck supple. No neck rigidity or muscular tenderness.  Cardiovascular:     Rate and Rhythm: Normal rate and regular rhythm.     Pulses: Normal pulses.     Heart sounds: Normal heart sounds.  Pulmonary:     Effort: Pulmonary effort is normal.     Breath sounds: Normal breath sounds. No wheezing, rhonchi or rales.  Chest:     Breasts:        Right: No swelling, bleeding, inverted nipple, mass, nipple discharge, skin change or tenderness.        Left: No swelling, bleeding, inverted nipple, mass, nipple discharge, skin change or tenderness.  Abdominal:  General: Bowel sounds are normal. There is no distension.     Palpations: Abdomen is soft.     Tenderness: There is no abdominal tenderness. There is no guarding or rebound.  Musculoskeletal: Normal range of motion.        General: No swelling or tenderness.  Lymphadenopathy:     Cervical: No cervical adenopathy.     Upper Body:     Right upper body: No supraclavicular or axillary adenopathy.     Left upper body: No supraclavicular or axillary adenopathy.  Skin:    General: Skin is warm and dry.     Capillary Refill: Capillary refill takes less than 2 seconds.  Neurological:     General: No focal deficit present.     Mental Status: She is alert and oriented to person, place, and time. Mental status is at baseline.     Cranial Nerves: No cranial nerve deficit.     Motor: No weakness.     Gait: Gait normal.  Psychiatric:        Mood and Affect: Mood normal.        Behavior: Behavior normal.        Thought Content: Thought content normal.        Judgment: Judgment normal.     Labs reviewed: Basic Metabolic Panel: Recent Labs    03/07/19 0805  NA 137  K 4.6  CL 103  CO2 27  GLUCOSE 89  BUN 21  CREATININE 1.01*  CALCIUM 9.1   Liver Function Tests: Recent Labs    03/07/19 0805  AST 20  ALT 13  BILITOT 0.7  PROT 6.3   No results for input(s): LIPASE, AMYLASE in  the last 8760 hours. No results for input(s): AMMONIA in the last 8760 hours. CBC: Recent Labs    03/07/19 0805  WBC 4.7  NEUTROABS 1,974  HGB 13.1  HCT 40.1  MCV 95.2  PLT 252   Cardiac Enzymes: No results for input(s): CKTOTAL, CKMB, CKMBINDEX, TROPONINI in the last 8760 hours. BNP: Invalid input(s): POCBNP Lab Results  Component Value Date   HGBA1C 5.2 03/07/2019   Lab Results  Component Value Date   TSH 1.49 03/07/2019   No results found for: VITAMINB12 No results found for: FOLATE No results found for: IRON, TIBC, FERRITIN   Assessment/Plan 1. Annual physical exam -performed today, doing well and up to date on all preventive care   2. Hyperlipidemia with target LDL less than 100 -lipids at goal - EKG 12-Lead NSR w/o acute ischemic changes or infarct  3. Hypothyroidism, unspecified type -cont current levothyroxine  4. BMI 29.0-29.9,adult -slight increased weight from last visit due to some sweets she's been eating, but has been more active  5. Overweight (BMI 25.0-29.9) -counseled on healthy eating and walking  6. Generalized anxiety disorder with panic attacks -cont prn anxiety meds  Labs/tests ordered:   Orders Placed This Encounter  Procedures  . EKG 12-Lead   F/u in 6 months for med mgt, no labs unless something changes  Arwa Yero L. Marian Meneely, D.O. [facility] [address], [place] [address] Cell Phone (Mon-Fri 8am-5pm):  [phone] On Call:  [phone] & follow prompts after 5pm & weekends Office Phone:  [phone] Office Fax:  [phone]

## 2019-03-21 ENCOUNTER — Telehealth: Payer: Self-pay | Admitting: *Deleted

## 2019-03-21 NOTE — Telephone Encounter (Signed)
Patient called and stated that she is having stomach cramping.Started yesterday. Tried to relate it to eating a bunch of blueberries. Started having gas today, which has helped.  No diarrhea. No constipation. No fever. Just cramping. Wanted to know if there is anything OTC she can take. Please Advise.

## 2019-03-21 NOTE — Telephone Encounter (Signed)
Patient notified and agreed.  

## 2019-03-21 NOTE — Telephone Encounter (Signed)
Magnesium 400mg  over the counter may be helpful for cramping.  Simethicone is good for gas.  She should monitor her temperature carefully.  She could be developing diverticulitis.

## 2019-03-22 ENCOUNTER — Encounter: Payer: Self-pay | Admitting: Internal Medicine

## 2019-04-04 ENCOUNTER — Telehealth: Payer: Self-pay | Admitting: *Deleted

## 2019-04-04 NOTE — Telephone Encounter (Signed)
Patient called and stated that her Kids were planning on coming down from [place] after 7/17 to visit. Patient is wondering if she and her Husband should get tested for COVID 19 to just be safe before her family comes down. Please Advise.  (Patient wants message sent to Dr. Mariea Clonts.)

## 2019-04-05 ENCOUNTER — Other Ambulatory Visit: Payer: Self-pay | Admitting: Pediatric Intensive Care

## 2019-04-05 DIAGNOSIS — Z20822 Contact with and (suspected) exposure to covid-19: Secondary | ICD-10-CM

## 2019-04-05 NOTE — Progress Notes (Signed)
lab

## 2019-04-05 NOTE — Telephone Encounter (Signed)
Patient decided to go ahead and get tested today for safety reasons before her kids come into town.

## 2019-04-11 LAB — NOVEL CORONAVIRUS, NAA: SARS-CoV-2, NAA: NOT DETECTED

## 2019-04-13 ENCOUNTER — Encounter: Payer: Self-pay | Admitting: Family

## 2019-04-13 ENCOUNTER — Other Ambulatory Visit: Payer: Self-pay

## 2019-04-13 ENCOUNTER — Ambulatory Visit (INDEPENDENT_AMBULATORY_CARE_PROVIDER_SITE_OTHER): Payer: Medicare Other | Admitting: Family

## 2019-04-13 DIAGNOSIS — R110 Nausea: Secondary | ICD-10-CM

## 2019-04-13 MED ORDER — ONDANSETRON HCL 4 MG PO TABS: 4.0000 mg | ORAL_TABLET | Freq: Three times a day (TID) | ORAL | 0 refills | Status: DC | PRN

## 2019-04-13 NOTE — Progress Notes (Addendum)
Provider: Gracielynn Birkel FNP-C  Gayland Curry, DO  Patient Care Team: Gayland Curry, DO as PCP - General (Geriatric Medicine)  Extended Emergency Contact Information Primary Emergency Contact: Melikian,Kenneth Address: [address]          [place], [place] [address] [name] of [place] Phone: [phone] Relation: Spouse  Code Status:  Goals of care: Advanced Directive information Advanced Directives 08/01/2018  Does Patient Have a Medical Advance Directive? Yes  Type of Advance Directive [place]  Does patient want to make changes to medical advance directive? No - Patient declined  Copy of [place] in Chart? Yes     Chief Complaint  Patient presents with  . Medical Management of Chronic Issues    Patient complaining of nausea and decreased energy that comes and goes.    This service is provided via telemedicine  No vital signs collected/recorded due to the encounter was a telemedicine visit.   Location of patient (ex: home, work):  Home  Patient consents to a telephone visit:  Yes  Location of the provider (ex: office, home):  Office  Name of any referring provider:  Dr. Mariea Clonts  Names of all persons participating in the telemedicine service and their role in the encounter:  Bonney Leitz, Gy[place]; Arielys Wandersee, NP; Rae Mar.  Time spent on call:  5 minutes   HPI:  Pt is a 78 y.o. female seen today for an acute visit for evaluation of nausea x several months.she states nausea occurs intermittently sometimes in the morning when she wakes up,before meals and at times after meals.nausea comes at times when playing golf with friends.she has had no vomiting with the nausea.she denies any abdominal pain/distension,bloating,constipation or diarrhea.she has had stomach cramping in the past but none lately.she denies any fever,chills cough or acid reflux.she does have post nasal drip which is controlled on Flonase spray  daily.states up to date with her colonoscopy.     Past Medical History:  Diagnosis Date  . Acute perichondritis of pinna   . Allergic rhinitis due to pollen   . Disorder of bone and cartilage, unspecified   . Encounter for long-term (current) use of other medications   . History of echocardiogram    Echo 9/18: EF 65-70  . History of exercise stress test 06/2017   ETT 9/18: no ischemia; hypertensive BP response  . HLD (hyperlipidemia)   . Osteoarthritis   . Panic disorder   . Unspecified dermatitis due to sun   . Unspecified hypothyroidism    Past Surgical History:  Procedure Laterality Date  . BREAST BIOPSY Right 07/14/2016  . BREAST BIOPSY Right 07/15/2016  . cataract surgery Bilateral 10/12/2016  . childbirth     x2 "blocks"  . COLONOSCOPY     x2  . TONSILLECTOMY  1949   removed    No Known Allergies  Outpatient Encounter Medications as of 04/13/2019  Medication Sig  . ALPRAZolam (XANAX) 0.25 MG tablet Take 1 tablet (0.25 mg total) by mouth 2 (two) times daily as needed (panic attacks).  Marland Kitchen atorvastatin (LIPITOR) 10 MG tablet TAKE 1 TABLET BY MOUTH DAILY FOR CHOLESTEROL  . CALCIUM PO Take 1 tablet by mouth daily.  . cholecalciferol (VITAMIN D) 1000 UNITS tablet Take 2,000 Units by mouth daily. Take 2 tablets daily  . citalopram (CELEXA) 20 MG tablet TAKE 1 TABLET BY MOUTH DAILY.  Marland Kitchen diclofenac sodium (VOLTAREN) 1 % GEL Apply 4 g topically 4 (four) times daily.  To affected knee  . fish oil-omega-3 fatty acids 1000 MG capsule Take 2 g by mouth daily. Take one tablet once a day.  . fluticasone (FLONASE) 50 MCG/ACT nasal spray Place 1 spray into both nostrils daily.  Marland Kitchen ibuprofen (ADVIL,MOTRIN) 200 MG tablet Take 200 mg by mouth every 6 (six) hours as needed for moderate pain.  Marland Kitchen levothyroxine (SYNTHROID, LEVOTHROID) 50 MCG tablet TAKE 1 TABLET BY MOUTH ONCE DAILY 30 MINUTES PRIOR TO BREAKFAST FOR THYROID  . loratadine (CLARITIN) 10 MG tablet Take 10 mg by mouth daily.  .  Multiple Vitamin (MULTIVITAMIN) tablet Take 1 tablet by mouth daily.   Marland Kitchen nystatin cream (MYCOSTATIN) Apply 1 application topically 2 (two) times daily. Under breast  . triamcinolone cream (KENALOG) 0.1 % Apply 1 application topically daily as needed (itching).    No facility-administered encounter medications on file as of 04/13/2019.     Review of Systems  Constitutional: Negative for appetite change, chills, fatigue and fever.  HENT: Positive for postnasal drip. Negative for congestion, rhinorrhea, sinus pressure, sinus pain, sneezing and sore throat.   Respiratory: Negative for cough, chest tightness, shortness of breath and wheezing.   Cardiovascular: Negative for chest pain, palpitations and leg swelling.  Gastrointestinal: Negative for abdominal distention, abdominal pain, anal bleeding, constipation, diarrhea and vomiting.       Chronic nausea   Genitourinary: Negative for difficulty urinating, dyspareunia, dysuria, flank pain, frequency, hematuria and urgency.  Skin: Negative for color change, pallor and rash.  Neurological: Negative for dizziness, light-headedness and headaches.  Psychiatric/Behavioral: Negative for agitation, confusion and sleep disturbance. The patient is not nervous/anxious.     Immunization History  Administered Date(s) Administered  . Hepatitis A 02/17/2013  . Hepatitis A, Adult 01/18/2014  . Hepatitis B 07/22/2006, 08/24/2006, 01/21/2007  . Influenza, High Dose Seasonal PF 06/21/2017, 08/01/2018  . Influenza,inj,Quad PF,6+ Mos 06/15/2013, 08/03/2014, 07/26/2015, 05/28/2016  . Pneumococcal Conjugate-13 11/29/2014  . Pneumococcal-Unspecified 10/05/2009  . Td 10/06/2007  . Tdap 03/15/2018  . Zoster Recombinat (Shingrix) 02/02/2018, 04/25/2018   Pertinent  Health Maintenance Due  Topic Date Due  . INFLUENZA VACCINE  05/06/2019  . DEXA SCAN  Completed  . PNA vac Low Risk Adult  Completed   Fall Risk  03/09/2019 03/09/2019 08/01/2018 05/30/2018 04/26/2018   Falls in the past year? 0 0 No No No  Number falls in past yr: 0 0 - - -  Injury with Fall? 0 0 - - -   There were no vitals filed for this visit. There is no height or weight on file to calculate BMI. Physical Exam  Unable to assess on telephone visit.  Labs reviewed: Recent Labs    03/07/19 0805  NA 137  K 4.6  CL 103  CO2 27  GLUCOSE 89  BUN 21  CREATININE 1.01*  CALCIUM 9.1   Recent Labs    03/07/19 0805  AST 20  ALT 13  BILITOT 0.7  PROT 6.3   Recent Labs    03/07/19 0805  WBC 4.7  NEUTROABS 1,974  HGB 13.1  HCT 40.1  MCV 95.2  PLT 252   Lab Results  Component Value Date   TSH 1.49 03/07/2019   Lab Results  Component Value Date   HGBA1C 5.2 03/07/2019   Lab Results  Component Value Date   CHOL 157 03/07/2019   HDL 63 03/07/2019   LDLCALC 76 03/07/2019   TRIG 101 03/07/2019   CHOLHDL 2.5 03/07/2019    Significant Diagnostic Results in  last 30 days:  No results found.  Assessment/Plan  Nausea without vomiting Reports no abdominal pain,distention,voimting,constipation or diarrhea.Nausea is intermittent now for several months.she has not taken any medication since does not have any vomiting but has affected her diet if nausea occurs before meals.No previous colonoscopy for reviewed though patient states up to date.   -start on Zofran 4 mg tablet one by mouth every 8 hours as needed. - Refer to ambulatory Gastroenterology for further evaluation.  Family/ staff Communication: Reviewed plan of care with patient.  Labs/tests ordered: None   Time spent with patient 15 minutes >50% time spent counseling; reviewing medical record; tests; labs; and developing future plan of care   Sandrea Hughs, NP

## 2019-05-02 ENCOUNTER — Telehealth: Payer: Self-pay | Admitting: Internal Medicine

## 2019-05-02 NOTE — Telephone Encounter (Signed)
Discussed with pt that the color change should be related to something she ate and it should change back to normal. Pt verbalized understanding.

## 2019-05-17 ENCOUNTER — Other Ambulatory Visit: Payer: Self-pay | Admitting: Internal Medicine

## 2019-05-17 DIAGNOSIS — F410 Panic disorder [episodic paroxysmal anxiety] without agoraphobia: Secondary | ICD-10-CM

## 2019-05-24 ENCOUNTER — Other Ambulatory Visit: Payer: Self-pay | Admitting: Internal Medicine

## 2019-07-31 ENCOUNTER — Other Ambulatory Visit: Payer: Self-pay | Admitting: Internal Medicine

## 2019-07-31 DIAGNOSIS — Z1231 Encounter for screening mammogram for malignant neoplasm of breast: Secondary | ICD-10-CM

## 2019-08-03 ENCOUNTER — Encounter: Payer: Self-pay | Admitting: Family

## 2019-08-03 ENCOUNTER — Ambulatory Visit: Payer: Self-pay

## 2019-08-04 ENCOUNTER — Encounter: Payer: Self-pay | Admitting: Family

## 2019-08-07 ENCOUNTER — Other Ambulatory Visit: Payer: Self-pay | Admitting: Internal Medicine

## 2019-08-07 DIAGNOSIS — L578 Other skin changes due to chronic exposure to nonionizing radiation: Secondary | ICD-10-CM | POA: Diagnosis not present

## 2019-08-07 DIAGNOSIS — L300 Nummular dermatitis: Secondary | ICD-10-CM | POA: Diagnosis not present

## 2019-08-07 DIAGNOSIS — D225 Melanocytic nevi of trunk: Secondary | ICD-10-CM | POA: Diagnosis not present

## 2019-08-07 DIAGNOSIS — L821 Other seborrheic keratosis: Secondary | ICD-10-CM | POA: Diagnosis not present

## 2019-08-08 ENCOUNTER — Other Ambulatory Visit: Payer: Self-pay

## 2019-08-08 ENCOUNTER — Ambulatory Visit (INDEPENDENT_AMBULATORY_CARE_PROVIDER_SITE_OTHER): Payer: Medicare Other | Admitting: Family

## 2019-08-08 ENCOUNTER — Encounter: Payer: Self-pay | Admitting: Family

## 2019-08-08 VITALS — BP 128/72 | HR 84 | Temp 97.5°F | Ht 65.0 in | Wt 177.2 lb

## 2019-08-08 DIAGNOSIS — Z23 Encounter for immunization: Secondary | ICD-10-CM

## 2019-08-08 DIAGNOSIS — Z0000 Encounter for general adult medical examination without abnormal findings: Secondary | ICD-10-CM

## 2019-08-08 NOTE — Patient Instructions (Signed)
Patricia Cantu , Thank you for taking time to come for your Medicare Wellness Visit. I appreciate your ongoing commitment to your health goals. Please review the following plan we discussed and let me know if I can assist you in the future.   Screening recommendations/referrals: Colonoscopy:  Mammogram:  Up to date  Bone Density : Up to date  Recommended yearly ophthalmology/optometry visit for glaucoma screening and checkup Recommended yearly dental visit for hygiene and checkup  Vaccinations: Influenza vaccine : Up to date  Pneumococcal vaccine : Up to date  Tdap vaccine:  Up to date due 6/11//2029  Shingles vaccine:Up to date   Advanced directives: Yes   Conditions/risks identified: Advance age > 77 yrs,Dyslipidemia   Next appointment: 1 year   Preventive Care 77 Years and Older, Female Preventive care refers to lifestyle choices and visits with your health care provider that can promote health and wellness. What does preventive care include?  A yearly physical exam. This is also called an annual well check.  Dental exams once or twice a year.  Routine eye exams. Ask your health care provider how often you should have your eyes checked.  Personal lifestyle choices, including:  Daily care of your teeth and gums.  Regular physical activity.  Eating a healthy diet.  Avoiding tobacco and drug use.  Limiting alcohol use.  Practicing safe sex.  Taking low-dose aspirin every day.  Taking vitamin and mineral supplements as recommended by your health care provider. What happens during an annual well check? The services and screenings done by your health care provider during your annual well check will depend on your age, overall health, lifestyle risk factors, and family history of disease. Counseling  Your health care provider may ask you questions about your:  Alcohol use.  Tobacco use.  Drug use.  Emotional well-being.  Home and relationship well-being.  Sexual  activity.  Eating habits.  History of falls.  Memory and ability to understand (cognition).  Work and work Statistician.  Reproductive health. Screening  You may have the following tests or measurements:  Height, weight, and BMI.  Blood pressure.  Lipid and cholesterol levels. These may be checked every 5 years, or more frequently if you are over 57 years old.  Skin check.  Lung cancer screening. You may have this screening every year starting at age 66 if you have a 30-pack-year history of smoking and currently smoke or have quit within the past 15 years.  Fecal occult blood test (FOBT) of the stool. You may have this test every year starting at age 75.  Flexible sigmoidoscopy or colonoscopy. You may have a sigmoidoscopy every 5 years or a colonoscopy every 10 years starting at age 13.  Hepatitis C blood test.  Hepatitis B blood test.  Sexually transmitted disease (STD) testing.  Diabetes screening. This is done by checking your blood sugar (glucose) after you have not eaten for a while (fasting). You may have this done every 1-3 years.  Bone density scan. This is done to screen for osteoporosis. You may have this done starting at age 56.  Mammogram. This may be done every 1-2 years. Talk to your health care provider about how often you should have regular mammograms. Talk with your health care provider about your test results, treatment options, and if necessary, the need for more tests. Vaccines  Your health care provider may recommend certain vaccines, such as:  Influenza vaccine. This is recommended every year.  Tetanus, diphtheria, and acellular pertussis (  Tdap, Td) vaccine. You may need a Td booster every 10 years.  Zoster vaccine. You may need this after age 20.  Pneumococcal 13-valent conjugate (PCV13) vaccine. One dose is recommended after age 8.  Pneumococcal polysaccharide (PPSV23) vaccine. One dose is recommended after age 7. Talk to your health care  provider about which screenings and vaccines you need and how often you need them. This information is not intended to replace advice given to you by your health care provider. Make sure you discuss any questions you have with your health care provider. Document Released: 10/18/2015 Document Revised: 06/10/2016 Document Reviewed: 07/23/2015 Elsevier Interactive Patient Education  2017 [place] Prevention in the Home Falls can cause injuries. They can happen to people of all ages. There are many things you can do to make your home safe and to help prevent falls. What can I do on the outside of my home?  Regularly fix the edges of walkways and driveways and fix any cracks.  Remove anything that might make you trip as you walk through a door, such as a raised step or threshold.  Trim any bushes or trees on the path to your home.  Use bright outdoor lighting.  Clear any walking paths of anything that might make someone trip, such as rocks or tools.  Regularly check to see if handrails are loose or broken. Make sure that both sides of any steps have handrails.  Any raised decks and porches should have guardrails on the edges.  Have any leaves, snow, or ice cleared regularly.  Use sand or salt on walking paths during winter.  Clean up any spills in your garage right away. This includes oil or grease spills. What can I do in the bathroom?  Use night lights.  Install grab bars by the toilet and in the tub and shower. Do not use towel bars as grab bars.  Use non-skid mats or decals in the tub or shower.  If you need to sit down in the shower, use a plastic, non-slip stool.  Keep the floor dry. Clean up any water that spills on the floor as soon as it happens.  Remove soap buildup in the tub or shower regularly.  Attach bath mats securely with double-sided non-slip rug tape.  Do not have throw rugs and other things on the floor that can make you trip. What can I do in  the bedroom?  Use night lights.  Make sure that you have a light by your bed that is easy to reach.  Do not use any sheets or blankets that are too big for your bed. They should not hang down onto the floor.  Have a firm chair that has side arms. You can use this for support while you get dressed.  Do not have throw rugs and other things on the floor that can make you trip. What can I do in the kitchen?  Clean up any spills right away.  Avoid walking on wet floors.  Keep items that you use a lot in easy-to-reach places.  If you need to reach something above you, use a strong step stool that has a grab bar.  Keep electrical cords out of the way.  Do not use floor polish or wax that makes floors slippery. If you must use wax, use non-skid floor wax.  Do not have throw rugs and other things on the floor that can make you trip. What can I do with my stairs?  Do  not leave any items on the stairs.  Make sure that there are handrails on both sides of the stairs and use them. Fix handrails that are broken or loose. Make sure that handrails are as long as the stairways.  Check any carpeting to make sure that it is firmly attached to the stairs. Fix any carpet that is loose or worn.  Avoid having throw rugs at the top or bottom of the stairs. If you do have throw rugs, attach them to the floor with carpet tape.  Make sure that you have a light switch at the top of the stairs and the bottom of the stairs. If you do not have them, ask someone to add them for you. What else can I do to help prevent falls?  Wear shoes that:  Do not have high heels.  Have rubber bottoms.  Are comfortable and fit you well.  Are closed at the toe. Do not wear sandals.  If you use a stepladder:  Make sure that it is fully opened. Do not climb a closed stepladder.  Make sure that both sides of the stepladder are locked into place.  Ask someone to hold it for you, if possible.  Clearly mark and  make sure that you can see:  Any grab bars or handrails.  First and last steps.  Where the edge of each step is.  Use tools that help you move around (mobility aids) if they are needed. These include:  Canes.  Walkers.  Scooters.  Crutches.  Turn on the lights when you go into a dark area. Replace any light bulbs as soon as they burn out.  Set up your furniture so you have a clear path. Avoid moving your furniture around.  If any of your floors are uneven, fix them.  If there are any pets around you, be aware of where they are.  Review your medicines with your doctor. Some medicines can make you feel dizzy. This can increase your chance of falling. Ask your doctor what other things that you can do to help prevent falls. This information is not intended to replace advice given to you by your health care provider. Make sure you discuss any questions you have with your health care provider. Document Released: 07/18/2009 Document Revised: 02/27/2016 Document Reviewed: 10/26/2014 Elsevier Interactive Patient Education  2017 [employer].

## 2019-08-08 NOTE — Progress Notes (Signed)
Subjective:   Patricia Cantu is a 78 y.o. female who presents for Medicare Annual (Subsequent) preventive examination.  Review of Systems:  Cardiac Risk Factors include: advanced age (>77men, >57 women);dyslipidemia     Objective:     Vitals: BP 128/72   Pulse 84   Temp (!) 97.5 F (36.4 C) (Temporal)   Ht 5\' 5"  (1.651 m)   Wt 177 lb 3.2 oz (80.4 kg)   SpO2 95%   BMI 29.49 kg/m   Body mass index is 29.49 kg/m.  Advanced Directives 08/01/2018 04/26/2018 06/21/2017 04/02/2017 03/04/2017 12/17/2016 11/28/2015  Does Patient Have a Medical Advance Directive? Yes Yes Yes No Yes Yes Yes  Type of Advance Directive [place] will - Living will Living will Out of facility DNR (pink MOST or yellow form)  Does patient want to make changes to medical advance directive? No - Patient declined - - - - - No - Patient declined  Copy of [place] in Chart? Yes - - - - - Yes    Tobacco Social History   Tobacco Use  Smoking Status Never Smoker  Smokeless Tobacco Never Used     Counseling given: Not Answered   Clinical Intake:  Pre-visit preparation completed: No  Pain : No/denies pain     BMI - recorded: 29.49 Nutritional Status: BMI 25 -29 Overweight Nutritional Risks: None Diabetes: No  How often do you need to have someone help you when you read instructions, pamphlets, or other written materials from your doctor or pharmacy?: 1 - Never What is the last grade level you completed in school?: Master Degrees  Interpreter Needed?: No  Information entered by :: Candace Begue FNP-C  Past Medical History:  Diagnosis Date  . Acute perichondritis of pinna   . Allergic rhinitis due to pollen   . Disorder of bone and cartilage, unspecified   . Encounter for long-term (current) use of other medications   . History of echocardiogram    Echo 9/18: EF 65-70  . History of exercise stress test 06/2017   ETT 9/18: no ischemia; hypertensive BP  response  . HLD (hyperlipidemia)   . Osteoarthritis   . Panic disorder   . Unspecified dermatitis due to sun   . Unspecified hypothyroidism    Past Surgical History:  Procedure Laterality Date  . BREAST BIOPSY Right 07/14/2016  . BREAST BIOPSY Right 07/15/2016  . cataract surgery Bilateral 10/12/2016  . childbirth     x2 "blocks"  . COLONOSCOPY     x2  . TONSILLECTOMY  1949   removed   Family History  Problem Relation Age of Onset  . Stroke Mother   . Heart failure Mother 70  . Hypertension Mother   . Heart attack Father 37  . Hyperlipidemia Sister   . Atrial fibrillation Sister   . Thyroid disease Sister   . Heart disease Son   . Thyroid disease Son   . Thyroid disease Daughter   . Atrial fibrillation Brother   . Breast cancer Maternal Grandmother 6  . Colon cancer Neg Hx    Social History   Socioeconomic History  . Marital status: Married    Spouse name: Not on file  . Number of children: Not on file  . Years of education: Not on file  . Highest education level: Not on file  Occupational History  . Occupation: retired  Scientific laboratory technician  . Financial resource strain: Not hard at all  . Food  insecurity    Worry: Never true    Inability: Never true  . Transportation needs    Medical: No    Non-medical: No  Tobacco Use  . Smoking status: Never Smoker  . Smokeless tobacco: Never Used  Substance and Sexual Activity  . Alcohol use: Yes    Alcohol/week: 2.0 standard drinks    Types: 2 Glasses of wine per week  . Drug use: No  . Sexual activity: Yes  Lifestyle  . Physical activity    Days per week: 5 days    Minutes per session: 30 min  . Stress: Not at all  Relationships  . Social connections    Talks on phone: More than three times a week    Gets together: More than three times a week    Attends religious service: More than 4 times per year    Active member of club or organization: Yes    Attends meetings of clubs or organizations: More than 4 times per  year    Relationship status: Married  Other Topics Concern  . Not on file  Social History Narrative   She is retired. 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 She is married and has 3 children and 6 grandchildren. She is originally from [place]. She has lived in [place]. She lived in [place] for 7 years. She denies tobacco abuse. She drinks alcohol on occasion.    Outpatient Encounter Medications as of 08/08/2019  Medication Sig  . ALPRAZolam (XANAX) 0.25 MG tablet Take 1 tablet (0.25 mg total) by mouth 2 (two) times daily as needed (panic attacks).  Marland Kitchen atorvastatin (LIPITOR) 10 MG tablet TAKE 1 TABLET BY MOUTH DAILY FOR CHOLESTEROL  . CALCIUM PO Take 1 tablet by mouth daily.  . cholecalciferol (VITAMIN D) 1000 UNITS tablet Take 2,000 Units by mouth daily. Take 2 tablets daily  . citalopram (CELEXA) 20 MG tablet TAKE 1 TABLET BY MOUTH DAILY.  Marland Kitchen diclofenac sodium (VOLTAREN) 1 % GEL Apply 4 g topically 4 (four) times daily. To affected knee  . fish oil-omega-3 fatty acids 1000 MG capsule Take 2 g by mouth daily. Take one tablet once a day.  . fluticasone (FLONASE) 50 MCG/ACT nasal spray Place 1 spray into both nostrils daily.  Marland Kitchen ibuprofen (ADVIL,MOTRIN) 200 MG tablet Take 200 mg by mouth every 6 (six) hours as needed for moderate pain.  Marland Kitchen levothyroxine (SYNTHROID) 50 MCG tablet TAKE 1 TABLET BY MOUTH ONCE DAILY 30 MINUTES PRIOR TO BREAKFAST FOR THYROID  . loratadine (CLARITIN) 10 MG tablet Take 10 mg by mouth daily as needed.   . Multiple Vitamin (MULTIVITAMIN) tablet Take 1 tablet by mouth daily.   Marland Kitchen nystatin cream (MYCOSTATIN) APPLY TO AFFECTED AREA UNDER BREAST TWICE A DAY  . triamcinolone cream (KENALOG) 0.1 % Apply 1 application topically daily as needed (itching).   . [DISCONTINUED] ondansetron (ZOFRAN) 4 MG tablet Take 1 tablet (4 mg total) by mouth every 8 (eight) hours as needed for nausea or vomiting.   No  facility-administered encounter medications on file as of 08/08/2019.     Activities of Daily Living In your present state of health, do you have any difficulty performing the following activities: 08/08/2019  Hearing? N  Vision? N  Difficulty concentrating or making decisions? N  Walking or climbing stairs? N  Dressing or bathing? N  Doing errands, shopping? N  Preparing Food and eating ? N  Using the Toilet? N  In the past six months, have you accidently leaked urine? Y  Comment wears a pad  Do you have problems with loss of bowel control? N  Managing your Medications? N  Managing your Finances? N  Housekeeping or managing your Housekeeping? N  Some recent data might be hidden    Patient Care Team: Gayland Curry, DO as PCP - General (Geriatric Medicine)    Assessment:   This is a routine wellness examination for [place].  Exercise Activities and Dietary recommendations Current Exercise Habits: Structured exercise class, Type of exercise: walking;yoga;Other - see comments(plays golf), Time (Minutes): 45, Frequency (Times/Week): 5, Weekly Exercise (Minutes/Week): 225, Intensity: Moderate, Exercise limited by: None identified  Goals    . Weight (lb) < 200 lb (90.7 kg)     1.I would like to loss 10 lbs less  2. Would like to live healthy like my mother did.        Fall Risk Fall Risk  08/08/2019 03/09/2019 03/09/2019 08/01/2018 05/30/2018  Falls in the past year? 0 0 0 No No  Number falls in past yr: 0 0 0 - -  Injury with Fall? 0 0 0 - -   Is the patient's home free of loose throw rugs in walkways, pet beds, electrical cords, etc?   no      Grab bars in the bathroom? yes      Handrails on the stairs?   no  Stairs       Adequate lighting?   yes  Depression Screen PHQ 2/9 Scores 08/08/2019 08/01/2018 05/30/2018 06/21/2017  PHQ - 2 Score 0 0 0 0     Cognitive Function MMSE - Mini Mental State Exam 08/08/2019 08/01/2018 12/17/2016 11/28/2015  Not completed: - - - (No Data)   Orientation to time 5 5 5 5   Orientation to Place 5 5 5 5   Registration 3 3 3 3   Attention/ Calculation 5 5 5 5   Recall 3 3 3 2   Language- name 2 objects 2 2 2 2   Language- repeat 1 1 1 1   Language- follow 3 step command 3 3 3 3   Language- read & follow direction 1 1 1 1   Write a sentence 1 1 1 1   Copy design 1 1 1 1   Total score 30 30 30 29         Immunization History  Administered Date(s) Administered  . Fluad Quad(high Dose 65+) 08/08/2019  . Hepatitis A 02/17/2013  . Hepatitis A, Adult 01/18/2014  . Hepatitis B 07/22/2006, 08/24/2006, 01/21/2007  . Influenza, High Dose Seasonal PF 06/21/2017, 08/01/2018  . Influenza,inj,Quad PF,6+ Mos 06/15/2013, 08/03/2014, 07/26/2015, 05/28/2016  . Pneumococcal Conjugate-13 11/29/2014  . Pneumococcal-Unspecified 10/05/2009  . Td 10/06/2007  . Tdap 03/15/2018  . Zoster Recombinat (Shingrix) 02/02/2018, 04/25/2018    Qualifies for Shingles Vaccine? Up to date   Screening Tests Health Maintenance  Topic Date Due  . TETANUS/TDAP  03/15/2028  . INFLUENZA VACCINE  Completed  . DEXA SCAN  Completed  . PNA vac Low Risk Adult  Completed    Cancer Screenings: Lung: Low Dose CT Chest recommended if Age 82-80 years, 30 pack-year currently smoking OR have quit w/in 15years. Patient does not qualify. Breast: Up to date on Mammogram? Yes   Up to date of Bone Density/Dexa? Yes Colorectal: Up to date   Additional Screenings: Hepatitis C Screening: Low Risk   Plan:   I have personally reviewed and noted the following in the patient's  chart:   . Medical and social history . Use of alcohol, tobacco or illicit drugs  . Current medications and supplements . Functional ability and status . Nutritional status . Physical activity . Advanced directives . List of other physicians . Hospitalizations, surgeries, and ER visits in previous 12 months . Vitals . Screenings to include cognitive, depression, and falls . Referrals and  appointments  In addition, I have reviewed and discussed with patient certain preventive protocols, quality metrics, and best practice recommendations. A written personalized care plan for preventive services as well as general preventive health recommendations were provided to patient.  Sandrea Hughs, NP  08/08/2019

## 2019-08-29 ENCOUNTER — Other Ambulatory Visit: Payer: Self-pay

## 2019-08-29 ENCOUNTER — Ambulatory Visit
Admission: RE | Admit: 2019-08-29 | Discharge: 2019-08-29 | Disposition: A | Discharge status: Home / Self Care | Payer: Medicare Other | Source: Ambulatory Visit | Attending: Family | Admitting: Family

## 2019-08-29 ENCOUNTER — Encounter: Payer: Self-pay | Admitting: Family

## 2019-08-29 ENCOUNTER — Ambulatory Visit (INDEPENDENT_AMBULATORY_CARE_PROVIDER_SITE_OTHER): Payer: Medicare Other | Admitting: Family

## 2019-08-29 DIAGNOSIS — R05 Cough: Secondary | ICD-10-CM

## 2019-08-29 DIAGNOSIS — R059 Cough, unspecified: Secondary | ICD-10-CM

## 2019-08-29 NOTE — Progress Notes (Signed)
This service is provided via telemedicine  No vital signs collected/recorded due to the encounter was a telemedicine visit.   Location of patient (ex: home, work):  Home   Patient consents to a telephone visit:  Yes  Location of the provider (ex: office, home):  Office  Name of any referring provider:  [name], D.O   Names of all persons participating in the telemedicine service and their role in the encounter: Marlowe Sax, NP, Ruthell Rummage CMA, and patient   Time spent on call:  Ruthell Rummage CMA spent 11 minutes on phone with patient    Provider: Braylin Formby FNP-C  Gayland Curry, DO  Patient Care Team: Gayland Curry, DO as PCP - General (Geriatric Medicine)  Extended Emergency Contact Information Primary Emergency Contact: Pickel,Kenneth Address: [address]          [place], [place] [address] [name] of [place] Phone: [phone] Relation: Spouse  Code Status:  Full Code  Goals of care: Advanced Directive information Advanced Directives 08/29/2019  Does Patient Have a Medical Advance Directive? Yes  Type of Advance Directive [place]  Does patient want to make changes to medical advance directive? No - Patient declined  Copy of [place] in Chart? Yes - validated most recent copy scanned in chart (See row information)     Chief Complaint  Patient presents with  . Acute Visit    Patient c/o of sinus drainage and cough reports it hurts in back. Patient states cough started yesterday. Patient states saturday she had shrimp scampi and was sick throwing up with diarrhea.   . Medication Management    patient denies taking anything     HPI:  Pt is a 78 y.o. female seen today for an acute visit for evaluation of cough.she states back hurts when she cough since Sunday.bilatera upper back and under the breast hurts with coughing.Cough is non-productive in general but once in a while coughs up her chronic  nasal drainage.she denies any fever or chills.Nausea,vomiting and diarrhea has stopped.Appetite  Nausea and vomiting and diarrhea on Saturday after eating shrimp.she denies any contact with sick person with COVID-19.she states tries to stay home with husband and wears mask whenever they are out. Patient's husband present during video call holding the phone for provider to examine patient.    Past Medical History:  Diagnosis Date  . Acute perichondritis of pinna   . Allergic rhinitis due to pollen   . Disorder of bone and cartilage, unspecified   . Encounter for long-term (current) use of other medications   . History of echocardiogram    Echo 9/18: EF 65-70  . History of exercise stress test 06/2017   ETT 9/18: no ischemia; hypertensive BP response  . HLD (hyperlipidemia)   . Osteoarthritis   . Panic disorder   . Unspecified dermatitis due to sun   . Unspecified hypothyroidism    Past Surgical History:  Procedure Laterality Date  . BREAST BIOPSY Right 07/14/2016  . BREAST BIOPSY Right 07/15/2016  . cataract surgery Bilateral 10/12/2016  . childbirth     x2 "blocks"  . COLONOSCOPY     x2  . TONSILLECTOMY  1949   removed    No Known Allergies  Outpatient Encounter Medications as of 08/29/2019  Medication Sig  . ALPRAZolam (XANAX) 0.25 MG tablet Take 1 tablet (0.25 mg total) by mouth 2 (two) times daily as needed (panic attacks).  Marland Kitchen atorvastatin (LIPITOR) 10 MG tablet  TAKE 1 TABLET BY MOUTH DAILY FOR CHOLESTEROL  . CALCIUM PO Take 1 tablet by mouth daily.  . cholecalciferol (VITAMIN D) 1000 UNITS tablet Take 2,000 Units by mouth daily. Take 2 tablets daily  . citalopram (CELEXA) 20 MG tablet TAKE 1 TABLET BY MOUTH DAILY.  Marland Kitchen diclofenac Sodium (VOLTAREN) 1 % GEL Apply 4 g topically 4 (four) times daily as needed.  . fish oil-omega-3 fatty acids 1000 MG capsule Take 2 g by mouth daily. Take one tablet once a day.  . fluticasone (FLONASE) 50 MCG/ACT nasal spray Place 1 spray into  both nostrils as needed for allergies or rhinitis.  Marland Kitchen ibuprofen (ADVIL,MOTRIN) 200 MG tablet Take 200 mg by mouth every 6 (six) hours as needed for moderate pain.  Marland Kitchen levothyroxine (SYNTHROID) 50 MCG tablet TAKE 1 TABLET BY MOUTH ONCE DAILY 30 MINUTES PRIOR TO BREAKFAST FOR THYROID  . loratadine (CLARITIN) 10 MG tablet Take 10 mg by mouth daily as needed.   . Multiple Vitamin (MULTIVITAMIN) tablet Take 1 tablet by mouth daily.   Marland Kitchen nystatin cream (MYCOSTATIN) Apply 1 application topically 2 (two) times daily as needed.  . triamcinolone cream (KENALOG) 0.1 % Apply 1 application topically daily as needed (itching).   . [DISCONTINUED] diclofenac sodium (VOLTAREN) 1 % GEL Apply 4 g topically 4 (four) times daily. To affected knee (Patient taking differently: Apply 4 g topically 4 (four) times daily as needed. To affected knee)  . [DISCONTINUED] fluticasone (FLONASE) 50 MCG/ACT nasal spray Place 1 spray into both nostrils daily. (Patient taking differently: Place 1 spray into both nostrils as needed. )  . [DISCONTINUED] nystatin cream (MYCOSTATIN) APPLY TO AFFECTED AREA UNDER BREAST TWICE A DAY (Patient taking differently: As needed)   No facility-administered encounter medications on file as of 08/29/2019.     Review of Systems  Constitutional: Negative for appetite change, chills, fatigue and fever.  HENT: Negative for congestion, sinus pressure, sinus pain, sneezing and sore throat.        Chronic nasal drainage.  Eyes: Negative for discharge, redness, itching and visual disturbance.  Respiratory: Negative for cough, chest tightness, shortness of breath and wheezing.   Cardiovascular: Negative for chest pain, palpitations and leg swelling.  Gastrointestinal: Negative for abdominal distention, abdominal pain, constipation, diarrhea, nausea and vomiting.  Genitourinary: Negative for decreased urine volume, difficulty urinating, dysuria, flank pain, frequency and urgency.  Musculoskeletal: Negative  for arthralgias, gait problem and myalgias.  Skin: Negative for color change, pallor and rash.  Neurological: Negative for dizziness, syncope, weakness, light-headedness, numbness and headaches.  Hematological: Does not bruise/bleed easily.  Psychiatric/Behavioral: Negative for agitation, confusion and sleep disturbance. The patient is not nervous/anxious.     Immunization History  Administered Date(s) Administered  . Fluad Quad(high Dose 65+) 08/08/2019  . Hepatitis A 02/17/2013  . Hepatitis A, Adult 01/18/2014  . Hepatitis B 07/22/2006, 08/24/2006, 01/21/2007  . Influenza, High Dose Seasonal PF 06/21/2017, 08/01/2018  . Influenza,inj,Quad PF,6+ Mos 06/15/2013, 08/03/2014, 07/26/2015, 05/28/2016  . Pneumococcal Conjugate-13 11/29/2014  . Pneumococcal-Unspecified 10/05/2009  . Td 10/06/2007  . Tdap 03/15/2018  . Zoster Recombinat (Shingrix) 02/02/2018, 04/25/2018   Pertinent  Health Maintenance Due  Topic Date Due  . INFLUENZA VACCINE  Completed  . DEXA SCAN  Completed  . PNA vac Low Risk Adult  Completed   Fall Risk  08/29/2019 08/08/2019 03/09/2019 03/09/2019 08/01/2018  Falls in the past year? 0 0 0 0 No  Number falls in past yr: 0 0 0 0 -  Injury  with Fall? 0 0 0 0 -    There were no vitals filed for this visit. There is no height or weight on file to calculate BMI. Physical Exam Constitutional:      General: She is not in acute distress.    Appearance: She is not ill-appearing.  HENT:     Head: Normocephalic.     Mouth/Throat:     Mouth: Mucous membranes are moist.  Eyes:     General: No scleral icterus.       Right eye: No discharge.        Left eye: No discharge.     Conjunctiva/sclera: Conjunctivae normal.  Musculoskeletal:     Right lower leg: No edema.     Left lower leg: No edema.  Skin:    Coloration: Skin is not pale.     Findings: No bruising or erythema.  Neurological:     Mental Status: She is alert and oriented to person, place, and time.      Cranial Nerves: No cranial nerve deficit.     Motor: No weakness.     Gait: Gait normal.  Psychiatric:        Mood and Affect: Mood normal.        Behavior: Behavior normal.        Thought Content: Thought content normal.        Judgment: Judgment normal.    Labs reviewed: Recent Labs    03/07/19 0805  NA 137  K 4.6  CL 103  CO2 27  GLUCOSE 89  BUN 21  CREATININE 1.01*  CALCIUM 9.1   Recent Labs    03/07/19 0805  AST 20  ALT 13  BILITOT 0.7  PROT 6.3   Recent Labs    03/07/19 0805  WBC 4.7  NEUTROABS 1,974  HGB 13.1  HCT 40.1  MCV 95.2  PLT 252   Lab Results  Component Value Date   TSH 1.49 03/07/2019   Lab Results  Component Value Date   HGBA1C 5.2 03/07/2019   Lab Results  Component Value Date   CHOL 157 03/07/2019   HDL 63 03/07/2019   LDLCALC 76 03/07/2019   TRIG 101 03/07/2019   CHOLHDL 2.5 03/07/2019    Significant Diagnostic Results in last 30 days:  No results found.  Assessment/Plan  Cough in adult Afebrile.Non-productive cough.No wheezing,chest pain or shortness of breath.Has some dyspnea with cough.No swelling on the legs.will get imaging to rule out acute abnormalities. - DG Chest 2 View; Future at [facility] in [place] patient states familiar with imaging place but wrote down the address.    Family/ staff Communication: Reviewed plan of care with patient and Husband.   Labs/tests ordered: - DG Chest 2 View; Future  Spent 15 minutes of face to face with patient     Sandrea Hughs, NP

## 2019-08-30 ENCOUNTER — Other Ambulatory Visit: Payer: Self-pay | Admitting: Family

## 2019-08-30 ENCOUNTER — Ambulatory Visit
Admission: RE | Admit: 2019-08-30 | Discharge: 2019-08-30 | Disposition: A | Discharge status: Home / Self Care | Payer: Medicare Other | Source: Ambulatory Visit | Attending: Family | Admitting: Family

## 2019-08-30 DIAGNOSIS — R05 Cough: Secondary | ICD-10-CM | POA: Diagnosis not present

## 2019-08-30 DIAGNOSIS — R059 Cough, unspecified: Secondary | ICD-10-CM

## 2019-09-05 ENCOUNTER — Ambulatory Visit (INDEPENDENT_AMBULATORY_CARE_PROVIDER_SITE_OTHER): Payer: Medicare Other | Admitting: Family

## 2019-09-05 ENCOUNTER — Encounter: Payer: Self-pay | Admitting: Family

## 2019-09-05 ENCOUNTER — Other Ambulatory Visit: Payer: Self-pay

## 2019-09-05 VITALS — Ht 65.0 in | Wt 177.2 lb

## 2019-09-05 DIAGNOSIS — R911 Solitary pulmonary nodule: Secondary | ICD-10-CM | POA: Diagnosis not present

## 2019-09-05 NOTE — Progress Notes (Signed)
This service is provided via telemedicine  No vital signs collected/recorded due to the encounter was a telemedicine visit.   Location of patient (ex: home, work):  Home  Patient consents to a telephone visit:  Yes  Location of the provider (ex: office, home):  [place] office  Name of any referring provider:  [name], DO   Names of all persons participating in the telemedicine service and their role in the encounter:  Bonney Leitz, [place]; Marlowe Sax, NP; patient  Time spent on call:  6.29 minutes - CMA time only    Provider: Saif Peter FNP-C  Gayland Curry, DO  Patient Care Team: Gayland Curry, DO as PCP - General (Geriatric Medicine)  Extended Emergency Contact Information Primary Emergency Contact: Silbaugh,Kenneth Address: [address]          [place], [place] [address] [name] of [place] Phone: [phone] Relation: Spouse  Code Status: Full Code  Goals of care: Advanced Directive information Advanced Directives 09/05/2019  Does Patient Have a Medical Advance Directive? -  Type of Advance Directive Living will;Healthcare Power of Attorney  Does patient want to make changes to medical advance directive? No - Patient declined  Copy of [place] in Chart? Yes - validated most recent copy scanned in chart (See row information)     Chief Complaint  Patient presents with  . Follow-up    Wants to followup on chest x-ray results from 08/30/19.    HPI:  Pt is a 78 y.o. female seen today for an acute visit for follow up chest X-ray results done 08/30/2019. She states recent cough and right side pain with cough has resolved.chest X-ray done 08/29/2019 indicated questionable nodular opacity on right upper lung thought to represent overlapping shadows repeat chest X-ray was recommended.also had mild bibasilar subsegmental atelectasis and or scarring. Her repeat chest X-ray showed faint persistent density on the right upper lobe CT scan was  recommended to rule out potential neoplasm.discuss results with patient and she indicates " would like to know what it is just for her a peace of mind".she states has no history of smoking but lived with sorority sisters who smoked around her. 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  On chart review last mammogram done 07/21/2018 showed no evidence of malignancy.She denies any cough,shortness of breath.. No contact with person with COVID-19.    Past Medical History:  Diagnosis Date  . Acute perichondritis of pinna   . Allergic rhinitis due to pollen   . Disorder of bone and cartilage, unspecified   . Encounter for long-term (current) use of other medications   . History of echocardiogram    Echo 9/18: EF 65-70  . History of exercise stress test 06/2017   ETT 9/18: no ischemia; hypertensive BP response  . HLD (hyperlipidemia)   . Osteoarthritis   . Panic disorder   . Unspecified dermatitis due to sun   . Unspecified hypothyroidism    Past Surgical History:  Procedure Laterality Date  . BREAST BIOPSY Right 07/14/2016  . BREAST BIOPSY Right 07/15/2016  . cataract surgery Bilateral 10/12/2016  . childbirth     x2 "blocks"  . COLONOSCOPY     x2  . TONSILLECTOMY  1949   removed    No Known Allergies  Outpatient Encounter Medications as of 09/05/2019  Medication Sig  . ALPRAZolam (XANAX) 0.25  MG tablet Take 1 tablet (0.25 mg total) by mouth 2 (two) times daily as needed (panic attacks).  Marland Kitchen atorvastatin (LIPITOR) 10 MG tablet TAKE 1 TABLET BY MOUTH DAILY FOR CHOLESTEROL  . CALCIUM PO Take 1,000 mg by mouth daily.   . cholecalciferol (VITAMIN D) 1000 UNITS tablet Take 1,000 Units by mouth daily.   . citalopram (CELEXA) 20 MG tablet TAKE 1 TABLET BY MOUTH DAILY.  Marland Kitchen diclofenac Sodium (VOLTAREN) 1 % GEL Apply 4 g topically 4 (four) times daily as needed.  . fish  oil-omega-3 fatty acids 1000 MG capsule Take 1 g by mouth daily.   . fluticasone (FLONASE) 50 MCG/ACT nasal spray Place 1 spray into both nostrils as needed for allergies or rhinitis.  Marland Kitchen ibuprofen (ADVIL,MOTRIN) 200 MG tablet Take 200 mg by mouth every 6 (six) hours as needed for moderate pain.  Marland Kitchen levothyroxine (SYNTHROID) 50 MCG tablet TAKE 1 TABLET BY MOUTH ONCE DAILY 30 MINUTES PRIOR TO BREAKFAST FOR THYROID  . loratadine (CLARITIN) 10 MG tablet Take 10 mg by mouth daily as needed.   . Multiple Vitamin (MULTIVITAMIN) tablet Take 1 tablet by mouth daily.   Marland Kitchen nystatin cream (MYCOSTATIN) Apply 1 application topically 2 (two) times daily as needed.  . triamcinolone cream (KENALOG) 0.1 % Apply 1 application topically daily as needed (itching).    No facility-administered encounter medications on file as of 09/05/2019.     Review of Systems  Constitutional: Negative for appetite change, chills, fatigue and fever.  HENT: Negative for congestion, sinus pressure, sinus pain, sneezing and sore throat.   Respiratory: Negative for cough, chest tightness, shortness of breath and wheezing.   Cardiovascular: Negative for chest pain, palpitations and leg swelling.  Gastrointestinal: Negative for abdominal distention, abdominal pain, constipation, diarrhea, nausea and vomiting.  Musculoskeletal: Negative for arthralgias and gait problem.  Neurological: Negative for dizziness, light-headedness and headaches.  Psychiatric/Behavioral: Negative for agitation, confusion and sleep disturbance. The patient is not nervous/anxious.     Immunization History  Administered Date(s) Administered  . Fluad Quad(high Dose 65+) 08/08/2019  . Hepatitis A 02/17/2013  . Hepatitis A, Adult 01/18/2014  . Hepatitis B 07/22/2006, 08/24/2006, 01/21/2007  . Influenza, High Dose Seasonal PF 06/21/2017, 08/01/2018  . Influenza,inj,Quad PF,6+ Mos 06/15/2013, 08/03/2014, 07/26/2015, 05/28/2016  . Pneumococcal Conjugate-13  11/29/2014  . Pneumococcal-Unspecified 10/05/2009  . Td 10/06/2007  . Tdap 03/15/2018  . Zoster Recombinat (Shingrix) 02/02/2018, 04/25/2018   Pertinent  Health Maintenance Due  Topic Date Due  . INFLUENZA VACCINE  Completed  . DEXA SCAN  Completed  . PNA vac Low Risk Adult  Completed   Fall Risk  09/05/2019 08/29/2019 08/08/2019 03/09/2019 03/09/2019  Falls in the past year? 0 0 0 0 0  Number falls in past yr: - 0 0 0 0  Injury with Fall? - 0 0 0 0    Vitals:   09/05/19 0857  Weight: 177 lb 3.2 oz (80.4 kg)  Height: 5\' 5"  (1.651 m)   Body mass index is 29.49 kg/m. Physical Exam Constitutional:      General: She is not in acute distress.    Appearance: She is not ill-appearing.  HENT:     Mouth/Throat:     Mouth: Mucous membranes are moist.  Eyes:     General: No scleral icterus.       Right eye: No discharge.        Left eye: No discharge.     Conjunctiva/sclera: Conjunctivae normal.  Pulmonary:  Effort: Pulmonary effort is normal. No respiratory distress.  Musculoskeletal: Normal range of motion.     Right lower leg: No edema.     Left lower leg: No edema.  Neurological:     Mental Status: She is alert and oriented to person, place, and time.     Gait: Gait normal.  Psychiatric:        Mood and Affect: Mood normal.        Behavior: Behavior normal.        Thought Content: Thought content normal.        Judgment: Judgment normal.    Labs reviewed: Recent Labs    03/07/19 0805  NA 137  K 4.6  CL 103  CO2 27  GLUCOSE 89  BUN 21  CREATININE 1.01*  CALCIUM 9.1   Recent Labs    03/07/19 0805  AST 20  ALT 13  BILITOT 0.7  PROT 6.3   Recent Labs    03/07/19 0805  WBC 4.7  NEUTROABS 1,974  HGB 13.1  HCT 40.1  MCV 95.2  PLT 252   Lab Results  Component Value Date   TSH 1.49 03/07/2019   Lab Results  Component Value Date   HGBA1C 5.2 03/07/2019   Lab Results  Component Value Date   CHOL 157 03/07/2019   HDL 63 03/07/2019   LDLCALC 76  03/07/2019   TRIG 101 03/07/2019   CHOLHDL 2.5 03/07/2019    Significant Diagnostic Results in last 30 days:  Dg Chest 2 View  Result Date: 08/30/2019 CLINICAL DATA:  Cough. EXAM: CHEST - 2 VIEW COMPARISON:  August 29, 2019. FINDINGS: The heart size and mediastinal contours are within normal limits. No pneumothorax or pleural effusion is noted. Faint persistent density remains in the right upper lobe. Left lung is clear. The visualized skeletal structures are unremarkable. IMPRESSION: Faint persistent density remains in right upper lobe. CT scan of the chest is recommended for further evaluation to rule out potential neoplasm. Electronically Signed   By: Marijo Conception M.D.   On: 08/30/2019 16:01   Dg Chest 2 View  Result Date: 08/29/2019 CLINICAL DATA:  Cough. EXAM: CHEST - 2 VIEW COMPARISON:  Chest x-ray 04/02/2017. FINDINGS: Mediastinum and hilar structures normal. Questionable nodular opacity right upper lung. This could represent overlapping shadows. Repeat PA and lateral chest x-ray suggested. If this nodular opacity persist nonenhanced chest CT suggested for further evaluation. Mild bibasilar subsegmental atelectasis and or scarring. No pleural effusion or pneumothorax. IMPRESSION: 1. Questionable nodular opacity right upper lung. This could represent overlapping shadows. Repeat PA lateral chest x-ray suggested. This nodular opacity persists nonenhanced chest CT suggested for further evaluation. 2.  Mild bibasilar subsegmental atelectasis and or scarring. Electronically Signed   By: Marcello Moores  Register   On: 08/29/2019 15:55    Assessment/Plan   Nodule of upper lobe of right lung Asymptomatic.previous cough and dyspnea has resolved.Her recent chest X-ray indicated faint persistent density on the right upper lobe CT scan was recommended to rule out potential neoplasm.Discussed with patient pros and cons of whether to do a CT scan or not.Patient would like CT scan ordered for a peace of her  mind.Address given for [facility] over [facility].Patient instructed that Imaging place will call her  for the appointment.   - [place]; Future  Family/ staff Communication: Reviewed plan of care with patient.   Labs/tests ordered: - CT CHEST W CONTRAST; Future  Spent 19  minutes of face to  face on video call with patient   Next appointment : 09/14/2019 with Dr.Reed for 6 months follow up.     Sandrea Hughs, NP

## 2019-09-05 NOTE — Patient Instructions (Signed)
Please get CT scan of the chest done at [facility] over [facility].Imaging place will call you for the appointment.

## 2019-09-14 ENCOUNTER — Ambulatory Visit: Payer: Medicare Other | Admitting: Internal Medicine

## 2019-09-15 ENCOUNTER — Ambulatory Visit
Admission: RE | Admit: 2019-09-15 | Discharge: 2019-09-15 | Disposition: A | Discharge status: Home / Self Care | Payer: Medicare Other | Source: Ambulatory Visit | Attending: Family | Admitting: Family

## 2019-09-15 ENCOUNTER — Other Ambulatory Visit: Payer: Self-pay

## 2019-09-15 DIAGNOSIS — R918 Other nonspecific abnormal finding of lung field: Secondary | ICD-10-CM | POA: Diagnosis not present

## 2019-09-15 DIAGNOSIS — R911 Solitary pulmonary nodule: Secondary | ICD-10-CM

## 2019-09-15 MED ORDER — IOPAMIDOL (ISOVUE-300) INJECTION 61%
60.0000 mL | Freq: Once | INTRAVENOUS | Status: AC | PRN
  Administered 2019-09-15: 60 mL via INTRAVENOUS

## 2019-09-18 ENCOUNTER — Other Ambulatory Visit: Payer: Self-pay

## 2019-09-18 ENCOUNTER — Ambulatory Visit
Admission: RE | Admit: 2019-09-18 | Discharge: 2019-09-18 | Disposition: A | Discharge status: Home / Self Care | Payer: Medicare Other | Source: Ambulatory Visit | Attending: Internal Medicine | Admitting: Internal Medicine

## 2019-09-18 DIAGNOSIS — Z1231 Encounter for screening mammogram for malignant neoplasm of breast: Secondary | ICD-10-CM

## 2019-09-21 ENCOUNTER — Ambulatory Visit (INDEPENDENT_AMBULATORY_CARE_PROVIDER_SITE_OTHER): Payer: Medicare Other | Admitting: Internal Medicine

## 2019-09-21 ENCOUNTER — Other Ambulatory Visit: Payer: Self-pay

## 2019-09-21 ENCOUNTER — Encounter: Payer: Self-pay | Admitting: Internal Medicine

## 2019-09-21 VITALS — BP 128/68 | HR 84 | Temp 98.6°F | Ht 65.0 in | Wt 176.0 lb

## 2019-09-21 DIAGNOSIS — R05 Cough: Secondary | ICD-10-CM

## 2019-09-21 DIAGNOSIS — R6884 Jaw pain: Secondary | ICD-10-CM | POA: Diagnosis not present

## 2019-09-21 DIAGNOSIS — R058 Other specified cough: Secondary | ICD-10-CM

## 2019-09-21 DIAGNOSIS — R918 Other nonspecific abnormal finding of lung field: Secondary | ICD-10-CM

## 2019-09-21 DIAGNOSIS — E039 Hypothyroidism, unspecified: Secondary | ICD-10-CM

## 2019-09-21 DIAGNOSIS — E785 Hyperlipidemia, unspecified: Secondary | ICD-10-CM

## 2019-09-21 DIAGNOSIS — E663 Overweight: Secondary | ICD-10-CM

## 2019-09-21 DIAGNOSIS — R911 Solitary pulmonary nodule: Secondary | ICD-10-CM

## 2019-09-21 NOTE — Progress Notes (Signed)
Location:  [facility] clinic  Provider: Dr. [name]   Goals of Care:  Advanced Directives 09/05/2019  Does Patient Have a Medical Advance Directive? -  Type of Advance Directive Living will;Healthcare Power of Attorney  Does patient want to make changes to medical advance directive? No - Patient declined  Copy of [place] in Chart? Yes - validated most recent copy scanned in chart (See row information)     Chief Complaint  Patient presents with  . Medical Management of Chronic Issues    53mth follow-up, discuss CT, TMJ, seafood allergy, cough (hack)    HPI: Patient is a 78 y.o. female seen today for medical management of chronic diseases and imaging results.   In the past few months, she has had a morning cough with colored sputum. The sputum is green/white in color. The mucus is thin and easy to cough up. She describes the cough as a hacking cough. Denies any pain or discomfort to chest or trouble breathing. Claims her mother and grandmother had the same type of cough in the past. Has been taking clairtin and flonase to help with the cough. The cough and congestion subside by lunchtime every day. She suspects it is allergy related, but does not admit to having worsening allergies this season. She does not think dryness is the cause of her cough. Changes the air filters in her home monthly.   Denies history of smoking or prolonged exposure to second-hand smoke. She has worked with wood and was exposed to wood dust for a brief period of time in the past.   She is taking her statin daily and trying to follow a low fat diet. Eats three meals a day and limits snacking. She exercises a few times a week playing golf or zoom exercise classes.   No recent injuries or falls.   Seen by dermatology, [facility] within the past month. She had a few questionable moles removed.   She complains of left sided jaw pain that occurs a few times a month. She  will notice the pain in the morning after sleeping. Not sure if she is clenching her teeth at night. Husband has not reported any sounds or changes in her sleep. She was seen by her dentist yesterday and neglected to discuss her jaw pain. When these episodes occur, she will take tylenol for pain.   Family will be coming from [place] for [place]. They will have a covid-19 test before visiting. Worried about getting covid, but will mask, distance and practice frequent handwashing while family is in town.         Past Medical History:  Diagnosis Date  . Acute perichondritis of pinna   . Allergic rhinitis due to pollen   . Disorder of bone and cartilage, unspecified   . Encounter for long-term (current) use of other medications   . History of echocardiogram    Echo 9/18: EF 65-70  . History of exercise stress test 06/2017   ETT 9/18: no ischemia; hypertensive BP response  . HLD (hyperlipidemia)   . Osteoarthritis   . Panic disorder   . Unspecified dermatitis due to sun   . Unspecified hypothyroidism     Past Surgical History:  Procedure Laterality Date  . BREAST BIOPSY Right 07/14/2016  . BREAST BIOPSY Right 07/15/2016  . cataract surgery Bilateral 10/12/2016  . childbirth     x2 "blocks"  . COLONOSCOPY     x2  .  TONSILLECTOMY  1949   removed    No Known Allergies  Outpatient Encounter Medications as of 09/21/2019  Medication Sig  . ALPRAZolam (XANAX) 0.25 MG tablet Take 1 tablet (0.25 mg total) by mouth 2 (two) times daily as needed (panic attacks).  Marland Kitchen atorvastatin (LIPITOR) 10 MG tablet TAKE 1 TABLET BY MOUTH DAILY FOR CHOLESTEROL  . CALCIUM PO Take 1,000 mg by mouth daily.   . cholecalciferol (VITAMIN D) 1000 UNITS tablet Take 1,000 Units by mouth daily.   . citalopram (CELEXA) 20 MG tablet TAKE 1 TABLET BY MOUTH DAILY.  Marland Kitchen diclofenac Sodium (VOLTAREN) 1 % GEL Apply 4 g topically 4 (four) times daily as needed.  . fish oil-omega-3 fatty acids 1000 MG capsule  Take 1 g by mouth daily.   . fluticasone (FLONASE) 50 MCG/ACT nasal spray Place 1 spray into both nostrils as needed for allergies or rhinitis.  Marland Kitchen ibuprofen (ADVIL,MOTRIN) 200 MG tablet Take 200 mg by mouth every 6 (six) hours as needed for moderate pain.  Marland Kitchen levothyroxine (SYNTHROID) 50 MCG tablet TAKE 1 TABLET BY MOUTH ONCE DAILY 30 MINUTES PRIOR TO BREAKFAST FOR THYROID  . loratadine (CLARITIN) 10 MG tablet Take 10 mg by mouth daily as needed.   . Multiple Vitamin (MULTIVITAMIN) tablet Take 1 tablet by mouth daily.   Marland Kitchen nystatin cream (MYCOSTATIN) Apply 1 application topically 2 (two) times daily as needed.  . triamcinolone cream (KENALOG) 0.1 % Apply 1 application topically daily as needed (itching).    No facility-administered encounter medications on file as of 09/21/2019.    Review of Systems:  Review of Systems  Constitutional: Negative for activity change, appetite change and fatigue.  HENT: Negative for dental problem, hearing loss and trouble swallowing.        Left sided jaw pain  Eyes: Negative for photophobia and visual disturbance.  Respiratory: Positive for cough. Negative for shortness of breath and wheezing.        Chest congestion in morning  Cardiovascular: Negative for chest pain and leg swelling.  Gastrointestinal: Negative for abdominal pain, constipation, diarrhea and nausea.  Endocrine: Negative for polydipsia, polyphagia and polyuria.  Genitourinary: Negative for dysuria, frequency, hematuria and vaginal bleeding.  Musculoskeletal: Negative.   Skin: Negative.   Neurological: Negative for dizziness, weakness and headaches.  Psychiatric/Behavioral: Negative for dysphoric mood and sleep disturbance. The patient is not nervous/anxious.     Health Maintenance  Topic Date Due  . TETANUS/TDAP  03/15/2028  . INFLUENZA VACCINE  Completed  . DEXA SCAN  Completed  . PNA vac Low Risk Adult  Completed    Physical Exam: Vitals:   09/21/19 0858  BP: 128/68  Pulse:  84  Temp: 98.6 F (37 C)  TempSrc: Oral  SpO2: 97%  Weight: 176 lb (79.8 kg)  Height: 5\' 5"  (1.651 m)   Body mass index is 29.29 kg/m. Physical Exam Vitals reviewed.  HENT:     Head: Normocephalic.     Jaw: No trismus, tenderness, swelling, pain on movement or malocclusion.  Neck:     Thyroid: No thyroid mass, thyromegaly or thyroid tenderness.  Cardiovascular:     Rate and Rhythm: Normal rate and regular rhythm.     Pulses: Normal pulses.     Heart sounds: Normal heart sounds. No murmur.  Pulmonary:     Effort: Pulmonary effort is normal. No respiratory distress.     Breath sounds: Normal breath sounds. No wheezing.  Abdominal:     General: Bowel sounds are normal.  Palpations: Abdomen is soft.  Musculoskeletal:        General: Normal range of motion.     Cervical back: Normal range of motion.     Right lower leg: No edema.     Left lower leg: No edema.  Skin:    General: Skin is warm and dry.     Capillary Refill: Capillary refill takes less than 2 seconds.  Neurological:     General: No focal deficit present.     Mental Status: She is alert and oriented to person, place, and time. Mental status is at baseline.  Psychiatric:        Mood and Affect: Mood normal.        Behavior: Behavior normal.        Thought Content: Thought content normal.        Judgment: Judgment normal.     Labs reviewed: Basic Metabolic Panel: Recent Labs    03/07/19 0805  NA 137  K 4.6  CL 103  CO2 27  GLUCOSE 89  BUN 21  CREATININE 1.01*  CALCIUM 9.1  TSH 1.49   Liver Function Tests: Recent Labs    03/07/19 0805  AST 20  ALT 13  BILITOT 0.7  PROT 6.3   No results for input(s): LIPASE, AMYLASE in the last 8760 hours. No results for input(s): AMMONIA in the last 8760 hours. CBC: Recent Labs    03/07/19 0805  WBC 4.7  NEUTROABS 1,974  HGB 13.1  HCT 40.1  MCV 95.2  PLT 252   Lipid Panel: Recent Labs    03/07/19 0805  CHOL 157  HDL 63  LDLCALC 76  TRIG  101  CHOLHDL 2.5   Lab Results  Component Value Date   HGBA1C 5.2 03/07/2019    Procedures since last visit: DG Chest 2 View  Result Date: 08/30/2019 CLINICAL DATA:  Cough. EXAM: CHEST - 2 VIEW COMPARISON:  August 29, 2019. FINDINGS: The heart size and mediastinal contours are within normal limits. No pneumothorax or pleural effusion is noted. Faint persistent density remains in the right upper lobe. Left lung is clear. The visualized skeletal structures are unremarkable. IMPRESSION: Faint persistent density remains in right upper lobe. CT scan of the chest is recommended for further evaluation to rule out potential neoplasm. Electronically Signed   By: Marijo Conception M.D.   On: 08/30/2019 16:01   DG Chest 2 View  Result Date: 08/29/2019 CLINICAL DATA:  Cough. EXAM: CHEST - 2 VIEW COMPARISON:  Chest x-ray 04/02/2017. FINDINGS: Mediastinum and hilar structures normal. Questionable nodular opacity right upper lung. This could represent overlapping shadows. Repeat PA and lateral chest x-ray suggested. If this nodular opacity persist nonenhanced chest CT suggested for further evaluation. Mild bibasilar subsegmental atelectasis and or scarring. No pleural effusion or pneumothorax. IMPRESSION: 1. Questionable nodular opacity right upper lung. This could represent overlapping shadows. Repeat PA lateral chest x-ray suggested. This nodular opacity persists nonenhanced chest CT suggested for further evaluation. 2.  Mild bibasilar subsegmental atelectasis and or scarring. Electronically Signed   By: Marcello Moores  Register   On: 08/29/2019 15:55   CT CHEST W CONTRAST  Result Date: 09/15/2019 CLINICAL DATA:  Chest x-ray with possible lung nodule. Abnormal right upper lobe lesion. EXAM: CT CHEST WITH CONTRAST TECHNIQUE: Multidetector CT imaging of the chest was performed during intravenous contrast administration. CONTRAST:  23mL ISOVUE-300 IOPAMIDOL (ISOVUE-300) INJECTION 61% COMPARISON:  Chest x-ray dated  event twenty-fifth 2020 FINDINGS: Cardiovascular: The heart size is normal. There  is no evidence for an aortic dissection. Atherosclerotic changes are noted of the thoracic aorta. There is an aberrant right subclavian artery, a normal variant. The arch vessels are otherwise unremarkable. Mediastinum/Nodes: --No mediastinal or hilar lymphadenopathy. --No axillary lymphadenopathy. --No supraclavicular lymphadenopathy. --Normal thyroid gland. --The esophagus is unremarkable Lungs/Pleura: There is a pulmonary opacity in the right upper lobe measuring approximately 1 cm. There may be small calcifications associated with this finding. Atelectasis versus scarring is noted in the right middle lobe and lingula. There is a 5 mm pulmonary nodule in the right lower lobe (axial series 3, image 127). There is no pneumothorax. No large pleural effusion. The trachea is unremarkable. Upper Abdomen: No acute abnormality. Musculoskeletal: No chest wall abnormality. No acute or significant osseous findings. IMPRESSION: 1. There is a 1 cm pulmonary opacity in the right upper lobe, corresponding well to the finding on the recent chest x-ray. There may be small calcifications associated with this finding. This finding is nonspecific and could represent an area of scarring, however, a true pulmonary nodule is not excluded. Follow-up CT in 3 months is recommended. 2. There is a 5 mm pulmonary nodule in the right lower lobe. Attention on follow-up. 3. Aberrant right subclavian artery, a normal variant. Aortic Atherosclerosis (ICD10-I70.0). Electronically Signed   By: Constance Holster M.D.   On: 09/15/2019 21:03   MM 3D SCREEN BREAST BILATERAL  Result Date: 09/19/2019 CLINICAL DATA:  Screening. EXAM: DIGITAL SCREENING BILATERAL MAMMOGRAM WITH TOMO AND CAD COMPARISON:  Previous exam(s). ACR Breast Density Category b: There are scattered areas of fibroglandular density. FINDINGS: There are no findings suspicious for malignancy. Images  were processed with CAD. IMPRESSION: No mammographic evidence of malignancy. A result letter of this screening mammogram will be mailed directly to the patient. RECOMMENDATION: Screening mammogram in one year. (Code:[id number]-B-01Y) BI-RADS CATEGORY  1: Negative. Electronically Signed   By: Abelardo Diesel M.D.   On: 09/19/2019 10:04    Assessment/Plan 1. Pulmonary nodules -radiology recommends 3 month follow up CT - will order CT chest with contrast- 3 months  2. Cough productive of clear sputum - will r/o allergies - may trial xyzal or zyrtec for 2 weeks to see if AM congestion subsides   3. Jaw pain - pain is infrequent at this time and resolved with tylenol - continue tylenol PRN for pain -may purchase OTC mouth guard if pain becomes more frequent    Labs/tests ordered:  Referral for CT of chest with contrast in 3 months, complete blood count with differential/platelets, complete metabolic panel with GFR, hemoglobin A1C, TSH, lipid panel - in 6 months Next appt:  6 month follow up

## 2019-09-21 NOTE — Patient Instructions (Addendum)
Try xyzal or zyrtec 10mg  for 2 weeks to see if that will help with your cough/am congestion.    Stay safe over the holidays and follow the 3 Ws.  Merry Christmas!

## 2019-09-25 ENCOUNTER — Ambulatory Visit: Payer: Medicare Other | Attending: Internal Medicine

## 2019-09-25 DIAGNOSIS — Z20822 Contact with and (suspected) exposure to covid-19: Secondary | ICD-10-CM

## 2019-09-25 DIAGNOSIS — Z20828 Contact with and (suspected) exposure to other viral communicable diseases: Secondary | ICD-10-CM | POA: Diagnosis not present

## 2019-09-26 LAB — NOVEL CORONAVIRUS, NAA: SARS-CoV-2, NAA: NOT DETECTED

## 2019-10-17 ENCOUNTER — Ambulatory Visit: Payer: Medicare Other | Attending: Internal Medicine

## 2019-10-17 DIAGNOSIS — Z23 Encounter for immunization: Secondary | ICD-10-CM | POA: Insufficient documentation

## 2019-10-17 NOTE — Progress Notes (Signed)
   Covid-19 Vaccination Clinic  Name:  Patricia Cantu    MRN: [id number] DOB: [birth year]  10/17/2019  Patricia Cantu was observed post Covid-19 immunization for 15 minutes without incidence. She was provided with Vaccine Information Sheet and instruction to access the V-Safe system.   Patricia Cantu was instructed to call 911 with any severe reactions post vaccine: Marland Kitchen Difficulty breathing  . Swelling of your face and throat  . A fast heartbeat  . A bad rash all over your body  . Dizziness and weakness    Immunizations Administered    Name Date Dose VIS Date Route   Pfizer COVID-19 Vaccine 10/17/2019  8:50 AM 0.3 mL 09/15/2019 Intramuscular   Manufacturer: [employer]   Lot: [id number]   [place]: [id number]

## 2019-11-03 ENCOUNTER — Other Ambulatory Visit: Payer: Self-pay

## 2019-11-03 ENCOUNTER — Encounter: Payer: Self-pay | Admitting: Family

## 2019-11-03 ENCOUNTER — Ambulatory Visit (INDEPENDENT_AMBULATORY_CARE_PROVIDER_SITE_OTHER): Payer: Medicare Other | Admitting: Family

## 2019-11-03 DIAGNOSIS — K5229 Other allergic and dietetic gastroenteritis and colitis: Secondary | ICD-10-CM | POA: Diagnosis not present

## 2019-11-03 DIAGNOSIS — R112 Nausea with vomiting, unspecified: Secondary | ICD-10-CM

## 2019-11-03 NOTE — Progress Notes (Signed)
Patient ID: Patricia Cantu, female   DOB: 08-Sep-1941, 79 y.o.   MRN: [id number] This service is provided via telemedicine  No vital signs collected/recorded due to the encounter was a telemedicine visit.   Location of patient (ex: home, work):  HOME  Patient consents to a telephone visit:  YES  Location of the provider (ex: office, home):  OFFICE  Name of any referring provider:  TIFFANY REED, DO  Names of all persons participating in the telemedicine service and their role in the encounter: PATIENT, Edwin Dada, [place], [facility], NP   Time spent on call:  3:15  Provider: Dinah Ngetich FNP-C  Gayland Curry, DO  Patient Care Team: Gayland Curry, DO as PCP - General (Geriatric Medicine)  Extended Emergency Contact Information Primary Emergency Contact: Davern,Kenneth Address: [address]          [place], [place] [address] [name] of [place] Phone: [phone] Relation: Spouse  Code Status:  Full code  Goals of care: Advanced Directive information Advanced Directives 09/05/2019  Does Patient Have a Medical Advance Directive? -  Type of Advance Directive Living will;Healthcare Power of Attorney  Does patient want to make changes to medical advance directive? No - Patient declined  Copy of [place] in Chart? Yes - validated most recent copy scanned in chart (See row information)     Chief Complaint  Patient presents with  . Acute Visit    NAUSEA, VOMITING AFTER EATING SHELLFISH    HPI:  Pt is a 79 y.o. female seen today for an acute visit for evaluation of nausea and vomiting x 1 day.she had shrimp last night after about an hour and a half had nausea and vomiting which lasted for about two hours.she denies any rash, fever,chills,abdominal pain or cramping.Also denies any swelling of the tongue,wheezing or shortness of breath.Has had similar symptoms in the past when she had lobster roll.No reaction when she eats salmon.   She would like  a referral to allergist to evaluate whether she is really allergic to sea food    Past Medical History:  Diagnosis Date  . Acute perichondritis of pinna   . Allergic rhinitis due to pollen   . Disorder of bone and cartilage, unspecified   . Encounter for long-term (current) use of other medications   . History of echocardiogram    Echo 9/18: EF 65-70  . History of exercise stress test 06/2017   ETT 9/18: no ischemia; hypertensive BP response  . HLD (hyperlipidemia)   . Osteoarthritis   . Panic disorder   . Unspecified dermatitis due to sun   . Unspecified hypothyroidism    Past Surgical History:  Procedure Laterality Date  . BREAST BIOPSY Right 07/14/2016  . BREAST BIOPSY Right 07/15/2016  . cataract surgery Bilateral 10/12/2016  . childbirth     x2 "blocks"  . COLONOSCOPY     x2  . TONSILLECTOMY  1949   removed    No Known Allergies  Outpatient Encounter Medications as of 11/03/2019  Medication Sig  . ALPRAZolam (XANAX) 0.25 MG tablet Take 1 tablet (0.25 mg total) by mouth 2 (two) times daily as needed (panic attacks).  Marland Kitchen atorvastatin (LIPITOR) 10 MG tablet TAKE 1 TABLET BY MOUTH DAILY FOR CHOLESTEROL  . CALCIUM PO Take 1,000 mg by mouth daily.   . cholecalciferol (VITAMIN D) 1000 UNITS tablet Take 1,000 Units by mouth daily.   . citalopram (CELEXA) 20 MG tablet TAKE 1 TABLET BY MOUTH DAILY.  Marland Kitchen  diclofenac Sodium (VOLTAREN) 1 % GEL Apply 4 g topically 4 (four) times daily as needed.  . fish oil-omega-3 fatty acids 1000 MG capsule Take 1 g by mouth daily.   . fluticasone (FLONASE) 50 MCG/ACT nasal spray Place 1 spray into both nostrils as needed for allergies or rhinitis.  Marland Kitchen ibuprofen (ADVIL,MOTRIN) 200 MG tablet Take 200 mg by mouth every 6 (six) hours as needed for moderate pain.  Marland Kitchen levothyroxine (SYNTHROID) 50 MCG tablet TAKE 1 TABLET BY MOUTH ONCE DAILY 30 MINUTES PRIOR TO BREAKFAST FOR THYROID  . loratadine (CLARITIN) 10 MG tablet Take 10 mg by mouth daily as needed.    . Multiple Vitamin (MULTIVITAMIN) tablet Take 1 tablet by mouth daily.   Marland Kitchen nystatin cream (MYCOSTATIN) Apply 1 application topically 2 (two) times daily as needed.  . triamcinolone cream (KENALOG) 0.1 % Apply 1 application topically daily as needed (itching).    No facility-administered encounter medications on file as of 11/03/2019.    Review of Systems  Constitutional: Negative for appetite change, chills and fever.       Feels tired from vomiting last night.   HENT: Negative for congestion, rhinorrhea, sinus pressure, sinus pain, sneezing, sore throat, tinnitus and trouble swallowing.   Eyes: Negative for pain, discharge, redness and itching.  Respiratory: Negative for cough, chest tightness, shortness of breath and wheezing.   Cardiovascular: Negative for chest pain, palpitations and leg swelling.  Gastrointestinal: Negative for abdominal distention, abdominal pain, constipation and diarrhea.       Had nausea, vomiting and lots of gas last night after eating shrimp.   Endocrine: Negative for cold intolerance, heat intolerance, polyphagia and polyuria.  Genitourinary: Negative for decreased urine volume, difficulty urinating, dysuria, flank pain, frequency, hematuria and urgency.  Musculoskeletal: Negative for arthralgias and myalgias.  Skin: Negative for color change, pallor and rash.  Neurological: Negative for dizziness, speech difficulty, weakness, light-headedness, numbness and headaches.  Hematological: Does not bruise/bleed easily.  Psychiatric/Behavioral: Negative for agitation and sleep disturbance. The patient is not nervous/anxious.     Immunization History  Administered Date(s) Administered  . Fluad Quad(high Dose 65+) 08/08/2019  . Hepatitis A 02/17/2013  . Hepatitis A, Adult 01/18/2014  . Hepatitis B 07/22/2006, 08/24/2006, 01/21/2007  . Influenza, High Dose Seasonal PF 06/21/2017, 08/01/2018  . Influenza,inj,Quad PF,6+ Mos 06/15/2013, 08/03/2014, 07/26/2015,  05/28/2016  . PFIZER SARS-COV-2 Vaccination 10/17/2019  . Pneumococcal Conjugate-13 11/29/2014  . Pneumococcal-Unspecified 10/05/2009  . Td 10/06/2007  . Tdap 03/15/2018  . Zoster Recombinat (Shingrix) 02/02/2018, 04/25/2018   Pertinent  Health Maintenance Due  Topic Date Due  . INFLUENZA VACCINE  Completed  . DEXA SCAN  Completed  . PNA vac Low Risk Adult  Completed   Fall Risk  11/03/2019 09/21/2019 09/05/2019 08/29/2019 08/08/2019  Falls in the past year? 0 0 0 0 0  Number falls in past yr: 0 0 - 0 0  Injury with Fall? 0 0 - 0 0   There were no vitals filed for this visit. There is no height or weight on file to calculate BMI. Physical Exam Unable to complete on Telephone visit.   Labs reviewed: Recent Labs    03/07/19 0805  NA 137  K 4.6  CL 103  CO2 27  GLUCOSE 89  BUN 21  CREATININE 1.01*  CALCIUM 9.1   Recent Labs    03/07/19 0805  AST 20  ALT 13  BILITOT 0.7  PROT 6.3   Recent Labs    03/07/19 0805  WBC 4.7  NEUTROABS 1,974  HGB 13.1  HCT 40.1  MCV 95.2  PLT 252   Lab Results  Component Value Date   TSH 1.49 03/07/2019   Lab Results  Component Value Date   HGBA1C 5.2 03/07/2019   Lab Results  Component Value Date   CHOL 157 03/07/2019   HDL 63 03/07/2019   LDLCALC 76 03/07/2019   TRIG 101 03/07/2019   CHOLHDL 2.5 03/07/2019    Significant Diagnostic Results in last 30 days:  No results found.  Assessment/Plan  1. Non-intractable vomiting with nausea, unspecified vomiting type Afebrile.Lasted about two hours after eating seafood.symptoms resolved. - Avoid sea foods until evaluated by specialist  - Read food labels to avoid  food manufactured in factories that [employer].  - Referral placed today for Allergist.specialist office will call you with appointment. - Call 9-1-1 or go to ED if you develop a reaction to sea food like throat closing up,swelling of the tongue or shortness of breath. - Ambulatory referral to  Allergy  2. Gastrointestinal food allergy Unclear if allergic to sea food.Has had nausea and vomiting in the past after eating sea food.will refer to Allergist specialist for further evaluation. - Ambulatory referral to Allergy  Family/ staff Communication: Reviewed plan of care with patient  Labs/tests ordered: None   Next Appointment: as needed if symptoms worsen.   Spent 11 minutes of non-face to face with patient    Sandrea Hughs, NP

## 2019-11-03 NOTE — Patient Instructions (Signed)
- Avoid sea foods until evaluated by specialist  - Read food labels to avoid  food manufactured in factories that [employer].  - Referral placed today for Allergist.specialist office will call you with appointment. - Call 9-1-1 or go to ED if you develop a reaction to sea food like throat closing up,swelling of the tongue or shortness of breath.   Food Allergy  A food allergy is when your body reacts to a food in a way that is not normal. The reaction can be mild or very bad. A very bad allergic reaction is called an anaphylactic reaction (anaphylaxis). A very bad reaction is an emergency. What are the causes? Common foods that can cause a reaction are:  Milk.  Eggs.  Peanuts.  Tree nuts. These include pecans, walnuts, and cashews.  Seafood.  Wheat.  Soy. What are the signs or symptoms? Signs of a mild reaction  Stuffy nose.  Tingling in the mouth.  An itchy, red rash.  Throwing up (vomiting).  Watery poop (diarrhea). Signs of a very bad reaction  Itchy, red, swollen areas of skin (hives).  Swelling of your: ? Eyes. ? Lips. ? Face. ? Mouth. ? Tongue. ? Throat.  Trouble with: ? Breathing. ? Talking. ? Swallowing.  Noisy breathing (wheezing).  Passing out (fainting).  Having any of these feelings: ? Warmth in your face (flushed). ? Dizziness. ? Light-headedness.  Pain in your belly. Follow these instructions at home: If you are being tested for an allergy:  Avoid foods as told by your doctor (elimination diet).  Write down what you eat and drink in a notebook (food diary). Each day, write: ? What you eat and drink and when. ? What problems you have and when. If you have a very bad allergy:   Wear a bracelet or necklace that says you have an allergy.  Carry your allergy kit (anaphylaxis kit) or an allergy shot (epinephrine injection) with you all the time. Use them as told by your doctor.  Make sure that you, your family, and your  boss know: ? The signs of a very bad reaction. ? How to use your allergy kit. ? How to give an allergy shot.  If you use an allergy shot: ? Get more right away in case you have another reaction. ? Get help. You can have a life-threatening reaction after taking the medicine (rebound anaphylaxis). General instructions  Avoid the foods that you are allergic to.  Read food labels. Look for ingredients that you are allergic to.  When you are at a restaurant, tell your server that you have an allergy. Ask if your meal has an ingredient that you are allergic to.  Take medicines only as told by your doctor. Do not drive until the medicine has worn off, unless your doctor says it is okay.  Tell all people who care for you that you have a food allergy. This includes your doctor and dentist.  If you think that you might be allergic to something else, talk with your doctor. Do not eat a food to see if you are allergic to it without talking with your doctor first. Contact a doctor if you:  Have signs of a reaction that have not gone away after 2 days.  Get worse.  Have new signs of a reaction. Get help right away if you have signs of a very bad reaction:  Itchy, red, swollen areas of skin.  Swelling of your: ? Eyes. ? Lips. ? Face. ?  Mouth. ? Tongue. ? Throat.  Trouble with: ? Breathing. ? Talking. ? Swallowing.  Noisy breathing (wheezing).  Passing out (fainting).  Having any of these feelings: ? Warmth in your face (flushed). ? Dizziness. ? Light-headedness. ? Pain in your belly. These signs may be an emergency. Do not wait to see if the signs will go away. Use your allergy shot or allergy kit as you have been told. Get medical help right away. Call your local emergency services (911 in the [place] Do not drive yourself to the hospital. If you had to use your allergy pen, you must go to the emergency room even if the medicine seems to be working. This is important because  another allergic reaction may happen within 3 days. Summary  A food allergy is when your body reacts to a food in a way that is not normal.  Avoid the foods that you are allergic to.  Wear a bracelet or necklace that says you have an allergy.  Carry your allergy kit (anaphylaxis kit) or an allergy shot (epinephrine injection) with you all the time. Use them as told by your doctor. This information is not intended to replace advice given to you by your health care provider. Make sure you discuss any questions you have with your health care provider. Document Revised: 09/21/2017 Document Reviewed: 09/21/2017 Elsevier Patient Education  [place].

## 2019-11-06 ENCOUNTER — Ambulatory Visit: Payer: Medicare Other | Attending: Internal Medicine

## 2019-11-06 DIAGNOSIS — Z23 Encounter for immunization: Secondary | ICD-10-CM | POA: Insufficient documentation

## 2019-11-06 NOTE — Progress Notes (Signed)
   Covid-19 Vaccination Clinic  Name:  Nasir Stoltman    MRN: [id number] DOB: [birth year]  11/06/2019  Ms. Mellema was observed post Covid-19 immunization for 15 minutes without incidence. She was provided with Vaccine Information Sheet and instruction to access the V-Safe system.   Ms. Mereness was instructed to call 911 with any severe reactions post vaccine: Marland Kitchen Difficulty breathing  . Swelling of your face and throat  . A fast heartbeat  . A bad rash all over your body  . Dizziness and weakness    Immunizations Administered    Name Date Dose VIS Date Route   Pfizer COVID-19 Vaccine 11/06/2019  8:29 AM 0.3 mL 09/15/2019 Intramuscular   Manufacturer: [place]   Lot: [id number]   [place]: [id number]

## 2019-11-20 ENCOUNTER — Other Ambulatory Visit: Payer: Self-pay | Admitting: Internal Medicine

## 2019-11-28 ENCOUNTER — Other Ambulatory Visit: Payer: Self-pay | Admitting: Internal Medicine

## 2019-11-30 ENCOUNTER — Other Ambulatory Visit: Payer: Self-pay | Admitting: Internal Medicine

## 2019-11-30 DIAGNOSIS — F410 Panic disorder [episodic paroxysmal anxiety] without agoraphobia: Secondary | ICD-10-CM

## 2019-11-30 DIAGNOSIS — F411 Generalized anxiety disorder: Secondary | ICD-10-CM

## 2019-12-20 ENCOUNTER — Ambulatory Visit
Admission: RE | Admit: 2019-12-20 | Discharge: 2019-12-20 | Disposition: A | Discharge status: Home / Self Care | Payer: Medicare Other | Source: Ambulatory Visit | Attending: Internal Medicine | Admitting: Internal Medicine

## 2019-12-20 DIAGNOSIS — R918 Other nonspecific abnormal finding of lung field: Secondary | ICD-10-CM | POA: Diagnosis not present

## 2019-12-20 DIAGNOSIS — R05 Cough: Secondary | ICD-10-CM

## 2019-12-20 DIAGNOSIS — R911 Solitary pulmonary nodule: Secondary | ICD-10-CM

## 2019-12-20 DIAGNOSIS — T781XXA Other adverse food reactions, not elsewhere classified, initial encounter: Secondary | ICD-10-CM | POA: Diagnosis not present

## 2019-12-20 DIAGNOSIS — R21 Rash and other nonspecific skin eruption: Secondary | ICD-10-CM | POA: Diagnosis not present

## 2019-12-20 DIAGNOSIS — J309 Allergic rhinitis, unspecified: Secondary | ICD-10-CM | POA: Diagnosis not present

## 2019-12-20 DIAGNOSIS — T781XXD Other adverse food reactions, not elsewhere classified, subsequent encounter: Secondary | ICD-10-CM | POA: Diagnosis not present

## 2019-12-20 DIAGNOSIS — R058 Other specified cough: Secondary | ICD-10-CM

## 2019-12-21 NOTE — Progress Notes (Signed)
No new nodules.  Nodules present are stable.  Annual follow-up is recommended until we reach 5 years from initial notation of the nodule.

## 2020-03-18 ENCOUNTER — Other Ambulatory Visit: Payer: Medicare Other | Admitting: Internal Medicine

## 2020-03-20 ENCOUNTER — Other Ambulatory Visit: Payer: Medicare Other

## 2020-03-20 ENCOUNTER — Other Ambulatory Visit: Payer: Self-pay

## 2020-03-20 DIAGNOSIS — E785 Hyperlipidemia, unspecified: Secondary | ICD-10-CM | POA: Diagnosis not present

## 2020-03-20 DIAGNOSIS — E663 Overweight: Secondary | ICD-10-CM

## 2020-03-20 DIAGNOSIS — E039 Hypothyroidism, unspecified: Secondary | ICD-10-CM

## 2020-03-21 ENCOUNTER — Other Ambulatory Visit: Payer: Self-pay

## 2020-03-21 ENCOUNTER — Encounter: Payer: Self-pay | Admitting: Internal Medicine

## 2020-03-21 ENCOUNTER — Ambulatory Visit (INDEPENDENT_AMBULATORY_CARE_PROVIDER_SITE_OTHER): Payer: Medicare Other | Admitting: Internal Medicine

## 2020-03-21 VITALS — BP 118/72 | HR 62 | Temp 98.7°F | Ht 65.0 in | Wt 165.6 lb

## 2020-03-21 DIAGNOSIS — F411 Generalized anxiety disorder: Secondary | ICD-10-CM | POA: Diagnosis not present

## 2020-03-21 DIAGNOSIS — F410 Panic disorder [episodic paroxysmal anxiety] without agoraphobia: Secondary | ICD-10-CM

## 2020-03-21 DIAGNOSIS — R918 Other nonspecific abnormal finding of lung field: Secondary | ICD-10-CM

## 2020-03-21 DIAGNOSIS — E785 Hyperlipidemia, unspecified: Secondary | ICD-10-CM | POA: Diagnosis not present

## 2020-03-21 DIAGNOSIS — Z0000 Encounter for general adult medical examination without abnormal findings: Secondary | ICD-10-CM | POA: Diagnosis not present

## 2020-03-21 DIAGNOSIS — Z1159 Encounter for screening for other viral diseases: Secondary | ICD-10-CM

## 2020-03-21 DIAGNOSIS — E663 Overweight: Secondary | ICD-10-CM

## 2020-03-21 LAB — COMPLETE METABOLIC PANEL WITH GFR
AG Ratio: 1.7 (calc) (ref 1.0–2.5)
ALT: 21 U/L (ref 6–29)
AST: 22 U/L (ref 10–35)
Albumin: 3.8 g/dL (ref 3.6–5.1)
Alkaline phosphatase (APISO): 54 U/L (ref 37–153)
BUN/Creatinine Ratio: 20 (calc) (ref 6–22)
BUN: 22 mg/dL (ref 7–25)
CO2: 25 mmol/L (ref 20–32)
Calcium: 8.9 mg/dL (ref 8.6–10.4)
Chloride: 103 mmol/L (ref 98–110)
Creat: 1.08 mg/dL — ABNORMAL HIGH (ref 0.60–0.93)
GFR, Est African American: 57 mL/min/{1.73_m2} — ABNORMAL LOW (ref >=60)
GFR, Est Non African American: 49 mL/min/{1.73_m2} — ABNORMAL LOW (ref >=60)
Globulin: 2.3 g/dL (calc) (ref 1.9–3.7)
Glucose, Bld: 92 mg/dL (ref 65–99)
Potassium: 4.4 mmol/L (ref 3.5–5.3)
Sodium: 136 mmol/L (ref 135–146)
Total Bilirubin: 0.6 mg/dL (ref 0.2–1.2)
Total Protein: 6.1 g/dL (ref 6.1–8.1)

## 2020-03-21 LAB — CBC WITH DIFFERENTIAL/PLATELET
Absolute Monocytes: 662 cells/uL (ref 200–950)
Basophils Absolute: 39 cells/uL (ref 0–200)
Basophils Relative: 0.8 %
Eosinophils Absolute: 181 cells/uL (ref 15–500)
Eosinophils Relative: 3.7 %
HCT: 38.7 % (ref 35.0–45.0)
Hemoglobin: 12.9 g/dL (ref 11.7–15.5)
Lymphs Abs: 1627 cells/uL (ref 850–3900)
MCH: 31 pg (ref 27.0–33.0)
MCHC: 33.3 g/dL (ref 32.0–36.0)
MCV: 93 fL (ref 80.0–100.0)
MPV: 10.4 fL (ref 7.5–12.5)
Monocytes Relative: 13.5 %
Neutro Abs: 2391 cells/uL (ref 1500–7800)
Neutrophils Relative %: 48.8 %
Platelets: 263 10*3/uL (ref 140–400)
RBC: 4.16 10*6/uL (ref 3.80–5.10)
RDW: 13 % (ref 11.0–15.0)
Total Lymphocyte: 33.2 %
WBC: 4.9 10*3/uL (ref 3.8–10.8)

## 2020-03-21 LAB — LIPID PANEL
Cholesterol: 117 mg/dL (ref <200)
HDL: 47 mg/dL — ABNORMAL LOW (ref >=50)
LDL Cholesterol (Calc): 56 mg/dL (calc)
Non-HDL Cholesterol (Calc): 70 mg/dL (calc) (ref <130)
Total CHOL/HDL Ratio: 2.5 (calc) (ref <5.0)
Triglycerides: 64 mg/dL (ref <150)

## 2020-03-21 LAB — HEMOGLOBIN A1C
Hgb A1c MFr Bld: 5.2 % of total Hgb (ref <5.7)
Mean Plasma Glucose: 103 (calc)
eAG (mmol/L): 5.7 (calc)

## 2020-03-21 LAB — TSH: TSH: 2.02 mIU/L (ref 0.40–4.50)

## 2020-03-21 MED ORDER — ATORVASTATIN CALCIUM 10 MG PO TABS: ORAL_TABLET | ORAL | 1 refills | Status: DC

## 2020-03-21 MED ORDER — CITALOPRAM HYDROBROMIDE 20 MG PO TABS: 20.0000 mg | ORAL_TABLET | Freq: Every day | ORAL | 1 refills | Status: DC

## 2020-03-21 MED ORDER — LEVOTHYROXINE SODIUM 50 MCG PO TABS: ORAL_TABLET | ORAL | 1 refills | Status: DC

## 2020-03-21 MED ORDER — ALPRAZOLAM 0.25 MG PO TABS: 0.2500 mg | ORAL_TABLET | Freq: Two times a day (BID) | ORAL | 3 refills | Status: DC | PRN

## 2020-03-21 NOTE — Progress Notes (Signed)
Provider:  Rexene Edison. Mariea Clonts, D.O., C.M.D. Location:   [place]  Place of Service:   clinic  Previous PCP: Gayland Curry, DO Patient Care Team: Gayland Curry, DO as PCP - General (Geriatric Medicine)  Extended Emergency Contact Information Primary Emergency Contact: Kok,Kenneth Address: [address]          [place], [place] [address] [name] of [place] Phone: [phone] Relation: Spouse  Code Status: DNR Goals of Care: Advanced Directive information Advanced Directives 03/21/2020  Does Patient Have a Medical Advance Directive? Yes  Type of Advance Directive [place]  Does patient want to make changes to medical advance directive? No - Patient declined  Copy of [place] in Chart? -      Chief Complaint  Patient presents with  . Annual Exam    Annual Physical Exam     HPI: Patient is a 80 y.o. female seen today for an annual physical exam.    She has developed an intolerance to shrimp and lobster--vomits until her ribs hurt and then diarrhea.  Shrimp happened twice and lobster once.  She was told she could try just plain lobster meat or two plain grilled shrimp small to see if she could tolerate.  She misses the seafood part of her diet.  She has lost 11 lbs since dec.  She wants to lose 5 more lbs.  We continue to watch the place in her lung--she's due in march next year for a recheck.  She thinks it's from the [employer] work she did.  She had been coughing quite a bit and that's how that got found incidentally.  She's been feeling good.  Has continued to play golf all throughout.  She has a new book coming--Holy Moly God You Must Have Been Kidding.  She's glad she got it out of her system.  Talks about "where the [place] sings" book.    She and her husband hope to live independently as long as possible.    She uses tylenol only for pain.  She is pushing her golf swing and that is when she uses it.  Has an area in her  back when she vacuums.    Past Medical History:  Diagnosis Date  . Acute perichondritis of pinna   . Allergic rhinitis due to pollen   . Disorder of bone and cartilage, unspecified   . Encounter for long-term (current) use of other medications   . History of echocardiogram    Echo 9/18: EF 65-70  . History of exercise stress test 06/2017   ETT 9/18: no ischemia; hypertensive BP response  . HLD (hyperlipidemia)   . Osteoarthritis   . Panic disorder   . Unspecified dermatitis due to sun   . Unspecified hypothyroidism    Past Surgical History:  Procedure Laterality Date  . BREAST BIOPSY Right 07/14/2016  . BREAST BIOPSY Right 07/15/2016  . cataract surgery Bilateral 10/12/2016  . childbirth     x2 "blocks"  . COLONOSCOPY     x2  . TONSILLECTOMY  1949   removed    reports that she has never smoked. She has never used smokeless tobacco. She reports current alcohol use of about 2.0 standard drinks of alcohol per week. She reports that she does not use drugs.  Functional Status Survey:    Family History  Problem Relation Age of Onset  . Stroke Mother   . Heart failure Mother 23  . Hypertension Mother   .  Heart attack Father 51  . Hyperlipidemia Sister   . Atrial fibrillation Sister   . Thyroid disease Sister   . Heart disease Son   . Thyroid disease Son   . Thyroid disease Daughter   . Atrial fibrillation Brother   . Breast cancer Maternal Grandmother 33  . Colon cancer Neg Hx     Health Maintenance  Topic Date Due  . Hepatitis C Screening  Never done  . INFLUENZA VACCINE  05/05/2020  . TETANUS/TDAP  03/15/2028  . DEXA SCAN  Completed  . COVID-19 Vaccine  Completed  . PNA vac Low Risk Adult  Completed    No Known Allergies  Outpatient Encounter Medications as of 03/21/2020  Medication Sig  . ALPRAZolam (XANAX) 0.25 MG tablet Take 1 tablet (0.25 mg total) by mouth 2 (two) times daily as needed (panic attacks).  Marland Kitchen atorvastatin (LIPITOR) 10 MG tablet TAKE 1  TABLET BY MOUTH DAILY FOR CHOLESTEROL  . CALCIUM PO Take 1,000 mg by mouth daily.   . cholecalciferol (VITAMIN D) 1000 UNITS tablet Take 1,000 Units by mouth daily.   . citalopram (CELEXA) 20 MG tablet Take 1 tablet (20 mg total) by mouth daily.  . diclofenac Sodium (VOLTAREN) 1 % GEL Apply 4 g topically 4 (four) times daily as needed.  . fish oil-omega-3 fatty acids 1000 MG capsule Take 1 g by mouth daily.   . fluticasone (FLONASE) 50 MCG/ACT nasal spray PLACE 1 SPRAY INTO BOTH NOSTRILS DAILY.  Marland Kitchen levothyroxine (SYNTHROID) 50 MCG tablet TAKE 1 TABLET BY MOUTH ONCE DAILY 30 MINUTES PRIOR TO BREAKFAST FOR THYROID  . Multiple Vitamin (MULTIVITAMIN) tablet Take 1 tablet by mouth daily.   Marland Kitchen nystatin cream (MYCOSTATIN) Apply 1 application topically 2 (two) times daily as needed.  . triamcinolone cream (KENALOG) 0.1 % Apply 1 application topically daily as needed (itching).   . [DISCONTINUED] ALPRAZolam (XANAX) 0.25 MG tablet Take 1 tablet (0.25 mg total) by mouth 2 (two) times daily as needed (panic attacks).  . [DISCONTINUED] atorvastatin (LIPITOR) 10 MG tablet TAKE 1 TABLET BY MOUTH DAILY FOR CHOLESTEROL  . [DISCONTINUED] citalopram (CELEXA) 20 MG tablet TAKE 1 TABLET BY MOUTH DAILY.  . [DISCONTINUED] levothyroxine (SYNTHROID) 50 MCG tablet TAKE 1 TABLET BY MOUTH ONCE DAILY 30 MINUTES PRIOR TO BREAKFAST FOR THYROID  . [DISCONTINUED] loratadine (CLARITIN) 10 MG tablet Take 10 mg by mouth daily as needed.   . [DISCONTINUED] ibuprofen (ADVIL,MOTRIN) 200 MG tablet Take 200 mg by mouth every 6 (six) hours as needed for moderate pain.   No facility-administered encounter medications on file as of 03/21/2020.    Review of Systems  Constitutional: Positive for weight loss. Negative for chills, fever and malaise/fatigue.  HENT: Negative for congestion, hearing loss and sore throat.   Eyes: Negative for blurred vision.  Respiratory: Negative for cough and shortness of breath.   Cardiovascular: Negative  for chest pain, palpitations and leg swelling.  Gastrointestinal: Negative for abdominal pain, blood in stool, constipation and melena.       Occasionally will go longer than normal b/w bms, but no concerning patterns or changes  Genitourinary:       Leakage, works on [employer]  Musculoskeletal: Negative for falls and joint pain.  Skin: Negative for itching and rash.  Neurological: Negative for dizziness and loss of consciousness.       Some numbness of right great toe; deviation of left great toe laterally  Psychiatric/Behavioral: Negative for depression and memory loss. The patient is nervous/anxious. The  patient does not have insomnia.        Has occasional periods of anxiety, but recently no problem--likes to have xanax on hand in case    Vitals:   03/21/20 0903  BP: 118/72  Pulse: 62  Temp: 98.7 F (37.1 C)  TempSrc: Temporal  SpO2: 97%  Weight: 165 lb 9.6 oz (75.1 kg)  Height: 5\' 5"  (1.651 m)   Body mass index is 27.56 kg/m. Physical Exam Vitals reviewed.  Constitutional:      General: She is not in acute distress.    Appearance: Normal appearance. She is not toxic-appearing.  HENT:     Head: Normocephalic and atraumatic.     Right Ear: Tympanic membrane, ear canal and external ear normal.     Left Ear: Tympanic membrane, ear canal and external ear normal.     Ears:     Comments: Small amount cerumen left    Nose: Nose normal.     Mouth/Throat:     Pharynx: Oropharynx is clear.  Eyes:     Extraocular Movements: Extraocular movements intact.     Conjunctiva/sclera: Conjunctivae normal.     Pupils: Pupils are equal, round, and reactive to light.  Cardiovascular:     Rate and Rhythm: Normal rate and regular rhythm.     Pulses: Normal pulses.     Heart sounds: Normal heart sounds.  Pulmonary:     Effort: Pulmonary effort is normal.     Breath sounds: Normal breath sounds. No wheezing, rhonchi or rales.  Chest:     Breasts:        Right: Normal. No inverted nipple,  mass, nipple discharge, skin change or tenderness.        Left: Normal. No inverted nipple, mass, nipple discharge, skin change or tenderness.  Abdominal:     General: Bowel sounds are normal. There is no distension.     Palpations: There is no mass.     Tenderness: There is no abdominal tenderness. There is no guarding or rebound.  Musculoskeletal:        General: Normal range of motion.     Cervical back: Neck supple. No tenderness.     Right lower leg: No edema.     Left lower leg: No edema.  Lymphadenopathy:     Cervical: No cervical adenopathy.     Upper Body:     Right upper body: No supraclavicular, axillary or pectoral adenopathy.     Left upper body: No supraclavicular, axillary or pectoral adenopathy.  Skin:    General: Skin is warm and dry.     Capillary Refill: Capillary refill takes less than 2 seconds.  Neurological:     General: No focal deficit present.     Mental Status: She is alert and oriented to person, place, and time.     Cranial Nerves: No cranial nerve deficit.     Sensory: No sensory deficit.     Motor: No weakness.     Gait: Gait normal.  Psychiatric:        Mood and Affect: Mood normal.        Behavior: Behavior normal.        Thought Content: Thought content normal.        Judgment: Judgment normal.     Labs reviewed: Basic Metabolic Panel: Recent Labs    03/20/20 0834  NA 136  K 4.4  CL 103  CO2 25  GLUCOSE 92  BUN 22  CREATININE 1.08*  CALCIUM 8.9  Liver Function Tests: Recent Labs    03/20/20 0834  AST 22  ALT 21  BILITOT 0.6  PROT 6.1   No results for input(s): LIPASE, AMYLASE in the last 8760 hours. No results for input(s): AMMONIA in the last 8760 hours. CBC: Recent Labs    03/20/20 0834  WBC 4.9  NEUTROABS 2,391  HGB 12.9  HCT 38.7  MCV 93.0  PLT 263   Cardiac Enzymes: No results for input(s): CKTOTAL, CKMB, CKMBINDEX, TROPONINI in the last 8760 hours. BNP: Invalid input(s): POCBNP Lab Results  Component  Value Date   HGBA1C 5.2 03/20/2020   Lab Results  Component Value Date   TSH 2.02 03/20/2020   No results found for: VITAMINB12 No results found for: FOLATE No results found for: IRON, TIBC, FERRITIN  Imaging and Procedures Recently: Reviewed last CT chest from march with nodules--next due in 3/22  Assessment/Plan 1. Annual physical exam -performed today - Basic metabolic panel; Future  2. Generalized anxiety disorder with panic attacks - doing well recently  -cont same regimen - citalopram (CELEXA) 20 MG tablet; Take 1 tablet (20 mg total) by mouth daily.  Dispense: 90 tablet; Refill: 1 - ALPRAZolam (XANAX) 0.25 MG tablet; Take 1 tablet (0.25 mg total) by mouth 2 (two) times daily as needed (panic attacks).  Dispense: 60 tablet; Refill: 3  3. Pulmonary nodules -f/u CT in 3/22 per guidelines due to stability at this point  4. Hyperlipidemia with target LDL less than 100 -has been at goal with regular diet and exercise regimen--keep up the good work here  5. Overweight (BMI 25.0-29.9) -bmi down to 27.56, she wants to lose 5 more lbs  6. 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 Solange Emry, D.O. [facility] [address], [place] [address] Cell Phone (Mon-Fri 8am-5pm):  [phone] On Call:  [phone] & follow prompts after 5pm & weekends Office Phone:  [phone] Office Fax:  [phone]

## 2020-03-22 DIAGNOSIS — H5211 Myopia, right eye: Secondary | ICD-10-CM | POA: Diagnosis not present

## 2020-03-22 DIAGNOSIS — Z9841 Cataract extraction status, right eye: Secondary | ICD-10-CM | POA: Diagnosis not present

## 2020-03-22 DIAGNOSIS — Z9842 Cataract extraction status, left eye: Secondary | ICD-10-CM | POA: Diagnosis not present

## 2020-07-01 ENCOUNTER — Telehealth: Payer: Self-pay | Admitting: *Deleted

## 2020-07-01 NOTE — Telephone Encounter (Signed)
I recommend she get her pfizer covid booster first.  Then she can get a flu shot 4 weeks later.

## 2020-07-01 NOTE — Telephone Encounter (Signed)
Patient called wanting to know if she could get the Covid Booster shot and the Flu Shot at the same time.   Stated that if she Can't, which one would be more important to get first. 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   Please Advise.

## 2020-07-01 NOTE — Telephone Encounter (Signed)
Patient notified and agreed.  

## 2020-08-05 DIAGNOSIS — C44529 Squamous cell carcinoma of skin of other part of trunk: Secondary | ICD-10-CM | POA: Diagnosis not present

## 2020-08-05 DIAGNOSIS — L821 Other seborrheic keratosis: Secondary | ICD-10-CM | POA: Diagnosis not present

## 2020-08-05 DIAGNOSIS — D225 Melanocytic nevi of trunk: Secondary | ICD-10-CM | POA: Diagnosis not present

## 2020-08-05 DIAGNOSIS — L570 Actinic keratosis: Secondary | ICD-10-CM | POA: Diagnosis not present

## 2020-08-05 DIAGNOSIS — D485 Neoplasm of uncertain behavior of skin: Secondary | ICD-10-CM | POA: Diagnosis not present

## 2020-08-09 ENCOUNTER — Encounter: Payer: Self-pay | Admitting: Family

## 2020-08-09 ENCOUNTER — Ambulatory Visit (INDEPENDENT_AMBULATORY_CARE_PROVIDER_SITE_OTHER): Payer: Medicare Other | Admitting: Family

## 2020-08-09 ENCOUNTER — Other Ambulatory Visit: Payer: Self-pay

## 2020-08-09 DIAGNOSIS — Z0000 Encounter for general adult medical examination without abnormal findings: Secondary | ICD-10-CM | POA: Diagnosis not present

## 2020-08-09 DIAGNOSIS — Z780 Asymptomatic menopausal state: Secondary | ICD-10-CM | POA: Diagnosis not present

## 2020-08-09 NOTE — Progress Notes (Signed)
    This service is provided via telemedicine  No vital signs collected/recorded due to the encounter was a telemedicine visit.   Location of patient (ex: home, work): Home.   Patient consents to a telephone visit: Yes.  Location of the provider (ex: office, home):  [facility].  Name of any referring provider: Reed, Tiffany L, DO   Names of all persons participating in the telemedicine service and their role in the encounter: Patient, Patricia Cantu, RMA, Ngetich, Dinah, NP.    Time spent on call: 8 minutes spent on the phone with Medical Assistant.   

## 2020-08-09 NOTE — Patient Instructions (Signed)
Patricia Cantu , Thank you for taking time to come for your Medicare Wellness Visit. I appreciate your ongoing commitment to your health goals. Please review the following plan we discussed and let me know if I can assist you in the future.   Screening recommendations/referrals: Colonoscopy: N/A  Mammogram: Due December,2021  Bone Density: Order today [facility] will call you to schedule appointment  Recommended yearly ophthalmology/optometry visit for glaucoma screening and checkup Recommended yearly dental visit for hygiene and checkup  Vaccinations: Influenza vaccine: Due please get Flu shot at your pharmacy or at [facility] office  Pneumococcal vaccine: Up to date  Tdap vaccine: Up to date  Shingles vaccine : Up to date   Advanced directives: yes   Conditions/risks identified:Adavance Age female > 59 yrs,Dyslipidemia   Next appointment: 1 year    Preventive Care 78 Years and Older, Female Preventive care refers to lifestyle choices and visits with your health care provider that can promote health and wellness. What does preventive care include?  A yearly physical exam. This is also called an annual well check.  Dental exams once or twice a year.  Routine eye exams. Ask your health care provider how often you should have your eyes checked.  Personal lifestyle choices, including:  Daily care of your teeth and gums.  Regular physical activity.  Eating a healthy diet.  Avoiding tobacco and drug use.  Limiting alcohol use.  Practicing safe sex.  Taking low-dose aspirin every day.  Taking vitamin and mineral supplements as recommended by your health care provider. What happens during an annual well check? The services and screenings done by your health care provider during your annual well check will depend on your age, overall health, lifestyle risk factors, and family history of disease. Counseling  Your health care provider may ask you questions about your:  Alcohol  use.  Tobacco use.  Drug use.  Emotional well-being.  Home and relationship well-being.  Sexual activity.  Eating habits.  History of falls.  Memory and ability to understand (cognition).  Work and work Statistician.  Reproductive health. Screening  You may have the following tests or measurements:  Height, weight, and BMI.  Blood pressure.  Lipid and cholesterol levels. These may be checked every 5 years, or more frequently if you are over 69 years old.  Skin check.  Lung cancer screening. You may have this screening every year starting at age 23 if you have a 30-pack-year history of smoking and currently smoke or have quit within the past 15 years.  Fecal occult blood test (FOBT) of the stool. You may have this test every year starting at age 38.  Flexible sigmoidoscopy or colonoscopy. You may have a sigmoidoscopy every 5 years or a colonoscopy every 10 years starting at age 78.  Hepatitis C blood test.  Hepatitis B blood test.  Sexually transmitted disease (STD) testing.  Diabetes screening. This is done by checking your blood sugar (glucose) after you have not eaten for a while (fasting). You may have this done every 1-3 years.  Bone density scan. This is done to screen for osteoporosis. You may have this done starting at age 44.  Mammogram. This may be done every 1-2 years. Talk to your health care provider about how often you should have regular mammograms. Talk with your health care provider about your test results, treatment options, and if necessary, the need for more tests. Vaccines  Your health care provider may recommend certain vaccines, such as:  Influenza  vaccine. This is recommended every year.  Tetanus, diphtheria, and acellular pertussis (Tdap, Td) vaccine. You may need a Td booster every 10 years.  Zoster vaccine. You may need this after age 78.  Pneumococcal 13-valent conjugate (PCV13) vaccine. One dose is recommended after age  78.  Pneumococcal polysaccharide (PPSV23) vaccine. One dose is recommended after age 78. Talk to your health care provider about which screenings and vaccines you need and how often you need them. This information is not intended to replace advice given to you by your health care provider. Make sure you discuss any questions you have with your health care provider. Document Released: 10/18/2015 Document Revised: 06/10/2016 Document Reviewed: 07/23/2015 Elsevier Interactive Patient Education  2017 [place] Prevention in the Home Falls can cause injuries. They can happen to people of all ages. There are many things you can do to make your home safe and to help prevent falls. What can I do on the outside of my home?  Regularly fix the edges of walkways and driveways and fix any cracks.  Remove anything that might make you trip as you walk through a door, such as a raised step or threshold.  Trim any bushes or trees on the path to your home.  Use bright outdoor lighting.  Clear any walking paths of anything that might make someone trip, such as rocks or tools.  Regularly check to see if handrails are loose or broken. Make sure that both sides of any steps have handrails.  Any raised decks and porches should have guardrails on the edges.  Have any leaves, snow, or ice cleared regularly.  Use sand or salt on walking paths during winter.  Clean up any spills in your garage right away. This includes oil or grease spills. What can I do in the bathroom?  Use night lights.  Install grab bars by the toilet and in the tub and shower. Do not use towel bars as grab bars.  Use non-skid mats or decals in the tub or shower.  If you need to sit down in the shower, use a plastic, non-slip stool.  Keep the floor dry. Clean up any water that spills on the floor as soon as it happens.  Remove soap buildup in the tub or shower regularly.  Attach bath mats securely with double-sided  non-slip rug tape.  Do not have throw rugs and other things on the floor that can make you trip. What can I do in the bedroom?  Use night lights.  Make sure that you have a light by your bed that is easy to reach.  Do not use any sheets or blankets that are too big for your bed. They should not hang down onto the floor.  Have a firm chair that has side arms. You can use this for support while you get dressed.  Do not have throw rugs and other things on the floor that can make you trip. What can I do in the kitchen?  Clean up any spills right away.  Avoid walking on wet floors.  Keep items that you use a lot in easy-to-reach places.  If you need to reach something above you, use a strong step stool that has a grab bar.  Keep electrical cords out of the way.  Do not use floor polish or wax that makes floors slippery. If you must use wax, use non-skid floor wax.  Do not have throw rugs and other things on the floor that can  make you trip. What can I do with my stairs?  Do not leave any items on the stairs.  Make sure that there are handrails on both sides of the stairs and use them. Fix handrails that are broken or loose. Make sure that handrails are as long as the stairways.  Check any carpeting to make sure that it is firmly attached to the stairs. Fix any carpet that is loose or worn.  Avoid having throw rugs at the top or bottom of the stairs. If you do have throw rugs, attach them to the floor with carpet tape.  Make sure that you have a light switch at the top of the stairs and the bottom of the stairs. If you do not have them, ask someone to add them for you. What else can I do to help prevent falls?  Wear shoes that:  Do not have high heels.  Have rubber bottoms.  Are comfortable and fit you well.  Are closed at the toe. Do not wear sandals.  If you use a stepladder:  Make sure that it is fully opened. Do not climb a closed stepladder.  Make sure that both  sides of the stepladder are locked into place.  Ask someone to hold it for you, if possible.  Clearly mark and make sure that you can see:  Any grab bars or handrails.  First and last steps.  Where the edge of each step is.  Use tools that help you move around (mobility aids) if they are needed. These include:  Canes.  Walkers.  Scooters.  Crutches.  Turn on the lights when you go into a dark area. Replace any light bulbs as soon as they burn out.  Set up your furniture so you have a clear path. Avoid moving your furniture around.  If any of your floors are uneven, fix them.  If there are any pets around you, be aware of where they are.  Review your medicines with your doctor. Some medicines can make you feel dizzy. This can increase your chance of falling. Ask your doctor what other things that you can do to help prevent falls. This information is not intended to replace advice given to you by your health care provider. Make sure you discuss any questions you have with your health care provider. Document Released: 07/18/2009 Document Revised: 02/27/2016 Document Reviewed: 10/26/2014 Elsevier Interactive Patient Education  2017 [employer].

## 2020-08-09 NOTE — Progress Notes (Signed)
Subjective:   Patricia Cantu is a 79 y.o. female who presents for Medicare Annual (Subsequent) preventive examination.  Review of Systems     Cardiac Risk Factors include: advanced age (>101men, >62 women);dyslipidemia     Objective:    Today's Vitals   08/09/20 1318  PainSc: 2    There is no height or weight on file to calculate BMI.  Advanced Directives 08/09/2020 03/21/2020 09/05/2019 09/05/2019 08/29/2019 08/01/2018 04/26/2018  Does Patient Have a Medical Advance Directive? Yes Yes - Yes Yes Yes Yes  Type of Advance Directive Living will Healthcare Power of Attorney Living will;Healthcare Power of [place] will Healthcare Power of [place] -  Does patient want to make changes to medical advance directive? No - Patient declined No - Patient declined No - Patient declined No - Patient declined No - Patient declined No - Patient declined -  Copy of [place] in Chart? - - Yes - validated most recent copy scanned in chart (See row information) Yes - validated most recent copy scanned in chart (See row information) Yes - validated most recent copy scanned in chart (See row information) Yes -    Current Medications (verified) Outpatient Encounter Medications as of 08/09/2020  Medication Sig   ALPRAZolam (XANAX) 0.25 MG tablet Take 1 tablet (0.25 mg total) by mouth 2 (two) times daily as needed (panic attacks).   atorvastatin (LIPITOR) 10 MG tablet TAKE 1 TABLET BY MOUTH DAILY FOR CHOLESTEROL   CALCIUM PO Take 1,000 mg by mouth daily.    cholecalciferol (VITAMIN D) 1000 UNITS tablet Take 1,000 Units by mouth daily.    citalopram (CELEXA) 20 MG tablet Take 1 tablet (20 mg total) by mouth daily.   diclofenac Sodium (VOLTAREN) 1 % GEL Apply 4 g topically 4 (four) times daily as needed.   fish oil-omega-3 fatty acids 1000 MG capsule Take 1 g by mouth daily.    fluticasone (FLONASE) 50 MCG/ACT nasal spray PLACE 1 SPRAY INTO BOTH  NOSTRILS DAILY.   levothyroxine (SYNTHROID) 50 MCG tablet TAKE 1 TABLET BY MOUTH ONCE DAILY 30 MINUTES PRIOR TO BREAKFAST FOR THYROID   Multiple Vitamin (MULTIVITAMIN) tablet Take 1 tablet by mouth daily.    nystatin cream (MYCOSTATIN) Apply 1 application topically 2 (two) times daily as needed.   triamcinolone cream (KENALOG) 0.1 % Apply 1 application topically daily as needed (itching).    No facility-administered encounter medications on file as of 08/09/2020.    Allergies (verified) Patient has no known allergies.   History: Past Medical History:  Diagnosis Date   Acute perichondritis of pinna    Allergic rhinitis due to pollen    Disorder of bone and cartilage, unspecified    Encounter for long-term (current) use of other medications    History of echocardiogram    Echo 9/18: EF 65-70   History of exercise stress test 06/2017   ETT 9/18: no ischemia; hypertensive BP response   HLD (hyperlipidemia)    Osteoarthritis    Panic disorder    Unspecified dermatitis due to sun    Unspecified hypothyroidism    Past Surgical History:  Procedure Laterality Date   BREAST BIOPSY Right 07/14/2016   BREAST BIOPSY Right 07/15/2016   cataract surgery Bilateral 10/12/2016   childbirth     x2 "blocks"   COLONOSCOPY     x2   TONSILLECTOMY  1949   removed   Family History  Problem Relation Age of Onset   Stroke  Mother    Heart failure Mother 55   Hypertension Mother    Heart attack Father 60   Hyperlipidemia Sister    Atrial fibrillation Sister    Thyroid disease Sister    Heart disease Son    Thyroid disease Son    Thyroid disease Daughter    Atrial fibrillation Brother    Breast cancer Maternal Grandmother 92   Colon cancer Neg Hx    Social History   Socioeconomic History   Marital status: Married    Spouse name: Not on file   Number of children: Not on file   Years of education: Not on file   Highest education level: Not on file    Occupational History   Occupation: retired  Tobacco Use   Smoking status: Never Smoker   Smokeless tobacco: Never Used  Scientific laboratory technician Use: Never used  Substance and Sexual Activity   Alcohol use: Yes    Alcohol/week: 2.0 standard drinks    Types: 2 Glasses of wine per week   Drug use: No   Sexual activity: Yes  Other Topics Concern   Not on file  Social History Narrative   She is retired. 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 She is married and has 3 children and 6 grandchildren. She is originally from [place]. She has lived in [place]. She lived in [place] for 7 years. She denies tobacco abuse. She drinks alcohol on occasion.   Social Determinants of Health   Financial Resource Strain:    Difficulty of Paying Living Expenses: Not on file  Food Insecurity:    Worried About Charity fundraiser in the Last Year: Not on file   [employer] of Food in the Last Year: Not on file  Transportation Needs:    Lack of Transportation (Medical): Not on file   Lack of Transportation (Non-Medical): Not on file  Physical Activity:    Days of Exercise per Week: Not on file   Minutes of Exercise per Session: Not on file  Stress:    Feeling of Stress : Not on file  Social Connections:    Frequency of Communication with Friends and Family: Not on file   Frequency of Social Gatherings with Friends and Family: Not on file   Attends Religious Services: Not on file   Active Member of Clubs or Organizations: Not on file   Attends Archivist Meetings: Not on file   Marital Status: Not on file    Tobacco Counseling Counseling given: Not Answered   Clinical Intake:  Pre-visit preparation completed: No  Pain : 0-10 Pain Score: 2  Pain Type: Chronic pain Pain Location: Knee Pain Orientation: Right Pain Radiating Towards: No Pain Descriptors / Indicators: Aching Pain Onset: More than a  month ago Pain Frequency: Intermittent Pain Relieving Factors: Ice,Tylenol Effect of Pain on Daily Activities: exercise  Pain Relieving Factors: Ice,Tylenol  BMI - recorded: 27.56 Nutritional Status: BMI 25 -29 Overweight Nutritional Risks: None Diabetes: No  How often do you need to have someone help you when you read instructions, pamphlets, or other written materials from your doctor or pharmacy?: 1 - Never (uses magnify glass) What is the last grade level you completed in school?: master degree  Diabetic?No   Interpreter Needed?: No  Information entered by :: Jannell Franta FNP-C   Activities of Daily Living In your present state of health, do you  have any difficulty performing the following activities: 08/09/2020  Hearing? N  Vision? N  Difficulty concentrating or making decisions? N  Walking or climbing stairs? N  Dressing or bathing? N  Doing errands, shopping? N  Preparing Food and eating ? N  Using the Toilet? N  In the past six months, have you accidently leaked urine? Y  Do you have problems with loss of bowel control? N  Managing your Medications? N  Managing your Finances? N  Housekeeping or managing your Housekeeping? 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     Assessment:   This is a routine wellness examination for Patricia Cantu.  Hearing/Vision screen  Hearing Screening   125Hz  250Hz  500Hz  1000Hz  2000Hz  3000Hz  4000Hz  6000Hz  8000Hz   Right ear:           Left ear:           Comments: No Hearing Concerns.  Vision Screening Comments: No Vision Concerns. Last Eye Exam 10/06/2019.  Dietary issues and exercise activities discussed: Current Exercise Habits: Home exercise routine, Type of exercise: strength  training/weights;stretching;walking (play golf), Time (Minutes): 50, Frequency (Times/Week): 4, Weekly Exercise (Minutes/Week): 200, Intensity: Moderate  Goals     Weight (lb) < 200 lb (90.7 kg)     1.I would like to loss 10 lbs less  2. Would like to live healthy like my mother did.       Depression Screen PHQ 2/9 Scores 08/09/2020 03/21/2020 09/21/2019 08/08/2019 08/01/2018 05/30/2018 06/21/2017  PHQ - 2 Score 0 0 0 0 0 0 0    Fall Risk Fall Risk  08/09/2020 03/21/2020 11/03/2019 09/21/2019 09/05/2019  Falls in the past year? 1 0 0 0 0  Number falls in past yr: 0 0 0 0 -  Comment Tripped on a curb, and knee was bleeding/hurting. 3 Months Ago. - - - -  Injury with Fall? 1 0 0 0 -    Any stairs in or around the home? No  If so, are there any without handrails? No  Home free of loose throw rugs in walkways, pet beds, electrical cords, etc? No  Adequate lighting in your home to reduce risk of falls? Yes   ASSISTIVE DEVICES UTILIZED TO PREVENT FALLS:  Life alert? No  Use of a cane, walker or w/c? No  Grab bars in the bathroom? Yes  Shower chair or bench in shower? No  Elevated toilet seat or a handicapped toilet? Yes   TIMED UP AND GO:  Was the test performed? No .  Length of time to ambulate 10 feet: N/A  sec.   Gait steady and fast without use of assistive device  Cognitive Function: MMSE - Mini Mental State Exam 08/08/2019 08/01/2018 12/17/2016 11/28/2015  Not completed: - - - (No Data)  Orientation to time 5 5 5 5   Orientation to Place 5 5 5 5   Registration 3 3 3 3   Attention/ Calculation 5 5 5 5   Recall 3 3 3 2   Language- name 2 objects 2 2 2 2   Language- repeat 1 1 1 1   Language- follow 3 step command 3 3 3 3   Language- read & follow direction 1 1 1 1   Write a sentence 1 1 1  1  Copy design 1 1 1 1   Total score 30 30 30 29      6CIT Screen 08/09/2020  What Year? 0 points  What month? 0 points  What time? 0 points  Count back from 20 2 points  Months in reverse 0  points  Repeat phrase 0 points  Total Score 2    Immunizations Immunization History  Administered Date(s) Administered   Fluad Quad(high Dose 65+) 08/08/2019   Hepatitis A 02/17/2013   Hepatitis A, Adult 01/18/2014   Hepatitis B 07/22/2006, 08/24/2006, 01/21/2007   Influenza, High Dose Seasonal PF 06/21/2017, 08/01/2018   Influenza,inj,Quad PF,6+ Mos 06/15/2013, 08/03/2014, 07/26/2015, 05/28/2016   PFIZER SARS-COV-2 Vaccination 10/17/2019, 11/06/2019, 07/10/2020   Pneumococcal Conjugate-13 11/29/2014   Pneumococcal-Unspecified 10/05/2009   Td 10/06/2007   Tdap 03/15/2018   Zoster Recombinat (Shingrix) 02/02/2018, 04/25/2018    TDAP status: Up to date Flu Vaccine status: Up to date Pneumococcal vaccine status: Up to date Covid-19 vaccine status: Completed vaccines  Qualifies for Shingles Vaccine? Yes   Zostavax completed Yes   Shingrix Completed?: Yes  Screening Tests Health Maintenance  Topic Date Due   Hepatitis C Screening  Never done   INFLUENZA VACCINE  05/05/2020   TETANUS/TDAP  03/15/2028   DEXA SCAN  Completed   COVID-19 Vaccine  Completed   PNA vac Low Risk Adult  Completed    Health Maintenance  Health Maintenance Due  Topic Date Due   Hepatitis C Screening  Never done   INFLUENZA VACCINE  05/05/2020    Colorectal cancer screening: No longer required.  Mammogram status: Completed 09/18/2019. Repeat every year Bone Density status: Completed 10/22/2015. Results reflect: Bone density results: NORMAL. Repeat every 2 years.  Lung Cancer Screening: (Low Dose CT Chest recommended if Age 65-80 years, 30 pack-year currently smoking OR have quit w/in 15years.) does not qualify.   Lung Cancer Screening Referral: No   Additional Screening:  Hepatitis C Screening: does not qualify; Completed Order in place   Vision Screening: Recommended annual ophthalmology exams for early detection of glaucoma and other disorders of the eye. Is the  patient up to date with their annual eye exam?  Yes  Who is the provider or what is the name of the office in which the patient attends annual eye exams? Dr.Bulakowski  If pt is not established with a provider, would they like to be referred to a provider to establish care? No .   Dental Screening: Recommended annual dental exams for proper oral hygiene  Community Resource Referral / Chronic Care Management: CRR required this visit?  No   CCM required this visit?  No      Plan:   - Hep C  - Influenza vaccine   I have personally reviewed and noted the following in the patients chart:    Medical and social history  Use of alcohol, tobacco or illicit drugs   Current medications and supplements  Functional ability and status  Nutritional status  Physical activity  Advanced directives  List of other physicians  Hospitalizations, surgeries, and ER visits in previous 12 months  Vitals  Screenings to include cognitive, depression, and falls  Referrals and appointments  In addition, I have reviewed and discussed with patient certain preventive protocols, quality metrics, and best practice recommendations. A written personalized care plan for preventive services as well as general preventive health recommendations were provided to patient.    Sandrea Hughs, NP   08/09/2020   Nurse Notes: due for hep C  screening orders in place to be drawn in [place] th 2020

## 2020-08-12 ENCOUNTER — Telehealth: Payer: Self-pay

## 2020-08-12 NOTE — Telephone Encounter (Signed)
[name], spark are scheduled for a virtual visit with your provider today.    Just as we do with appointments in the office, we must obtain your consent to participate.  Your consent will be active for this visit and any virtual visit you may have with one of our providers in the next 365 days.    If you have a MyChart account, I can also send a copy of this consent to you electronically.  All virtual visits are billed to your insurance company just like a traditional visit in the office.  As this is a virtual visit, video technology does not allow for your provider to perform a traditional examination.  This may limit your provider's ability to fully assess your condition.  If your provider identifies any concerns that need to be evaluated in person or the need to arrange testing such as labs, EKG, etc, we will make arrangements to do so.    Although advances in technology are sophisticated, we cannot ensure that it will always work on either your end or our end.  If the connection with a video visit is poor, we may have to switch to a telephone visit.  With either a video or telephone visit, we are not always able to ensure that we have a secure connection.   I need to obtain your verbal consent now.   Are you willing to proceed with your visit today?   Patricia Cantu has provided verbal consent on 08/09/2020 for a virtual visit (video or telephone).   Otis Peak, CMA 08/09/2020  1:00 PM

## 2020-09-03 ENCOUNTER — Other Ambulatory Visit: Payer: Self-pay | Admitting: Family

## 2020-09-03 DIAGNOSIS — Z1231 Encounter for screening mammogram for malignant neoplasm of breast: Secondary | ICD-10-CM

## 2020-09-16 ENCOUNTER — Other Ambulatory Visit: Payer: Self-pay

## 2020-09-16 ENCOUNTER — Other Ambulatory Visit: Payer: Medicare Other

## 2020-09-16 DIAGNOSIS — Z1159 Encounter for screening for other viral diseases: Secondary | ICD-10-CM

## 2020-09-16 DIAGNOSIS — Z0000 Encounter for general adult medical examination without abnormal findings: Secondary | ICD-10-CM | POA: Diagnosis not present

## 2020-09-17 DIAGNOSIS — L905 Scar conditions and fibrosis of skin: Secondary | ICD-10-CM | POA: Diagnosis not present

## 2020-09-17 DIAGNOSIS — D045 Carcinoma in situ of skin of trunk: Secondary | ICD-10-CM | POA: Diagnosis not present

## 2020-09-17 LAB — BASIC METABOLIC PANEL
BUN: 21 mg/dL (ref 7–25)
CO2: 27 mmol/L (ref 20–32)
Calcium: 9.2 mg/dL (ref 8.6–10.4)
Chloride: 103 mmol/L (ref 98–110)
Creat: 0.89 mg/dL (ref 0.60–0.93)
Glucose, Bld: 87 mg/dL (ref 65–99)
Potassium: 4.5 mmol/L (ref 3.5–5.3)
Sodium: 137 mmol/L (ref 135–146)

## 2020-09-17 LAB — HEPATITIS C ANTIBODY
Hepatitis C Ab: NONREACTIVE
SIGNAL TO CUT-OFF: 0.02 (ref <1.00)

## 2020-09-17 NOTE — Progress Notes (Signed)
Kidney function looks a lot better. Hepatitis C screen is negative.

## 2020-09-19 ENCOUNTER — Ambulatory Visit (INDEPENDENT_AMBULATORY_CARE_PROVIDER_SITE_OTHER): Payer: Medicare Other | Admitting: Internal Medicine

## 2020-09-19 ENCOUNTER — Encounter: Payer: Self-pay | Admitting: Internal Medicine

## 2020-09-19 ENCOUNTER — Other Ambulatory Visit: Payer: Self-pay

## 2020-09-19 VITALS — BP 128/62 | HR 67 | Temp 97.7°F | Ht 65.0 in | Wt 169.1 lb

## 2020-09-19 DIAGNOSIS — F411 Generalized anxiety disorder: Secondary | ICD-10-CM

## 2020-09-19 DIAGNOSIS — F410 Panic disorder [episodic paroxysmal anxiety] without agoraphobia: Secondary | ICD-10-CM

## 2020-09-19 DIAGNOSIS — R918 Other nonspecific abnormal finding of lung field: Secondary | ICD-10-CM | POA: Diagnosis not present

## 2020-09-19 DIAGNOSIS — E785 Hyperlipidemia, unspecified: Secondary | ICD-10-CM | POA: Diagnosis not present

## 2020-09-19 DIAGNOSIS — M436 Torticollis: Secondary | ICD-10-CM | POA: Diagnosis not present

## 2020-09-19 DIAGNOSIS — E039 Hypothyroidism, unspecified: Secondary | ICD-10-CM

## 2020-09-19 NOTE — Progress Notes (Signed)
Location:  [facility] clinic Provider:  Jesse Hirst L. Mariea Clonts, D.O., C.M.D.  Goals of Care:  Advanced Directives 09/19/2020  Does Patient Have a Medical Advance Directive? Yes  Type of Advance Directive Living will  Does patient want to make changes to medical advance directive? No - Patient declined  Copy of [place] in Chart? -     Chief Complaint  Patient presents with  . Medical Management of Chronic Issues    6 month follow up     HPI: Patient is a 79 y.o. female seen today for medical management of chronic diseases.    She's written another book.    She's been fine except she tried to do yoga again and she had to reach with her left shoulder and neck area.  She got very sore.  It was healing, but could not do any exercise for a while.  The other night she was moving a pile of books.  Her neck was very stiff after that.  She's going to try chair yoga and some classes at the club.    She has not been sick.    Sleep erratic as usual.  Cannot find a think it's related.  Looks at her fitbit and it's not accurate.  Gets 5.5 to 6 hrs sleep.    She fell once tripping over a sidewalk in front of [place] on [place].  Some young people helped her up after asking if it was ok.  Has a little scab on there knee.    She has a spot being scraped by dermatology.  She's going to do the "blue light special".    Planning an [place] trip.  CT chest due to March.  Past Medical History:  Diagnosis Date  . Acute perichondritis of pinna   . Allergic rhinitis due to pollen   . Disorder of bone and cartilage, unspecified   . Encounter for long-term (current) use of other medications   . History of echocardiogram    Echo 9/18: EF 65-70  . History of exercise stress test 06/2017   ETT 9/18: no ischemia; hypertensive BP response  . HLD (hyperlipidemia)   . Osteoarthritis   . Panic disorder   . Unspecified dermatitis due to sun   . Unspecified hypothyroidism     Past Surgical  History:  Procedure Laterality Date  . BREAST BIOPSY Right 07/14/2016  . BREAST BIOPSY Right 07/15/2016  . cataract surgery Bilateral 10/12/2016  . childbirth     x2 "blocks"  . COLONOSCOPY     x2  . TONSILLECTOMY  1949   removed    No Known Allergies  Outpatient Encounter Medications as of 09/19/2020  Medication Sig  . ALPRAZolam (XANAX) 0.25 MG tablet Take 1 tablet (0.25 mg total) by mouth 2 (two) times daily as needed (panic attacks).  Marland Kitchen atorvastatin (LIPITOR) 10 MG tablet TAKE 1 TABLET BY MOUTH DAILY FOR CHOLESTEROL  . CALCIUM PO Take 1,000 mg by mouth daily.   . cholecalciferol (VITAMIN D) 1000 UNITS tablet Take 1,000 Units by mouth daily.   . citalopram (CELEXA) 20 MG tablet Take 1 tablet (20 mg total) by mouth daily.  . diclofenac Sodium (VOLTAREN) 1 % GEL Apply 4 g topically 4 (four) times daily as needed.  . fish oil-omega-3 fatty acids 1000 MG capsule Take 1 g by mouth daily.   . fluticasone (FLONASE) 50 MCG/ACT nasal spray PLACE 1 SPRAY INTO BOTH NOSTRILS DAILY.  Marland Kitchen levothyroxine (SYNTHROID) 50 MCG tablet TAKE  1 TABLET BY MOUTH ONCE DAILY 30 MINUTES PRIOR TO BREAKFAST FOR THYROID  . Multiple Vitamin (MULTIVITAMIN) tablet Take 1 tablet by mouth daily.  Marland Kitchen nystatin cream (MYCOSTATIN) Apply 1 application topically 2 (two) times daily as needed.  . triamcinolone cream (KENALOG) 0.1 % Apply 1 application topically daily as needed (itching).    No facility-administered encounter medications on file as of 09/19/2020.    Review of Systems:  Review of Systems  Constitutional: Negative for chills, fever, malaise/fatigue and weight loss.  HENT: Negative for congestion, hearing loss and sore throat.   Eyes: Negative for blurred vision.  Respiratory: Negative for cough and shortness of breath.   Cardiovascular: Negative for chest pain, palpitations and leg swelling.  Gastrointestinal: Negative for abdominal pain, blood in stool, constipation, diarrhea and melena.  Genitourinary:        Urinary leakage  Musculoskeletal: Positive for falls, joint pain, myalgias and neck pain.  Skin: Negative for itching and rash.  Neurological: Negative for dizziness and loss of consciousness.  Endo/Heme/Allergies: Does not bruise/bleed easily.  Psychiatric/Behavioral: Negative for depression and memory loss. The patient is nervous/anxious and has insomnia.    Health Maintenance  Topic Date Due  . TETANUS/TDAP  03/15/2028  . INFLUENZA VACCINE  Completed  . DEXA SCAN  Completed  . COVID-19 Vaccine  Completed  . Hepatitis C Screening  Completed  . PNA vac Low Risk Adult  Completed    Physical Exam: Vitals:   09/19/20 0906  BP: 128/62  Pulse: 67  Temp: 97.7 F (36.5 C)  TempSrc: Temporal  SpO2: 97%  Weight: 169 lb 1.6 oz (76.7 kg)  Height: 5\' 5"  (1.651 m)   Body mass index is 28.14 kg/m. Physical Exam Vitals reviewed.  Constitutional:      General: She is not in acute distress.    Appearance: Normal appearance. She is not toxic-appearing.  HENT:     Head: Normocephalic and atraumatic.  Neck:     Comments: Left trapezius spasms and torticollis Cardiovascular:     Rate and Rhythm: Normal rate and regular rhythm.     Pulses: Normal pulses.     Heart sounds: Normal heart sounds.  Pulmonary:     Effort: Pulmonary effort is normal.     Breath sounds: Normal breath sounds. No wheezing, rhonchi or rales.  Abdominal:     General: Bowel sounds are normal.  Musculoskeletal:     Cervical back: Tenderness present.     Comments: See neck  Skin:    Comments: Knee abrasion  Neurological:     General: No focal deficit present.     Mental Status: She is alert and oriented to person, place, and time.  Psychiatric:        Mood and Affect: Mood normal.        Behavior: Behavior normal.        Thought Content: Thought content normal.        Judgment: Judgment normal.     Labs reviewed: Basic Metabolic Panel: Recent Labs    03/20/20 0834 09/16/20 0845  NA 136 137   K 4.4 4.5  CL 103 103  CO2 25 27  GLUCOSE 92 87  BUN 22 21  CREATININE 1.08* 0.89  CALCIUM 8.9 9.2  TSH 2.02  --    Liver Function Tests: Recent Labs    03/20/20 0834  AST 22  ALT 21  BILITOT 0.6  PROT 6.1   No results for input(s): LIPASE, AMYLASE in the last 8760  hours. No results for input(s): AMMONIA in the last 8760 hours. CBC: Recent Labs    03/20/20 0834  WBC 4.9  NEUTROABS 2,391  HGB 12.9  HCT 38.7  MCV 93.0  PLT 263   Lipid Panel: Recent Labs    03/20/20 0834  CHOL 117  HDL 47*  LDLCALC 56  TRIG 64  CHOLHDL 2.5   Lab Results  Component Value Date   HGBA1C 5.2 03/20/2020    Procedures since last visit: No results found.  Assessment/Plan 1. Generalized anxiety disorder with panic attacks -cont prn xanax and regular celexa for anxiety and fairly-related stressors  2. Left torticollis -recommended massage for this, heat, theragesic cream  3. Hyperlipidemia with target LDL less than 100 -cont lipitor therapy, has had LDL at goal  4. 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 Natasha Burda, D.O. [facility] [address], [place] [address] Cell Phone (Mon-Fri 8am-5pm):  [phone] On Call:  [phone] & follow prompts after 5pm & weekends Office Phone:  [phone] Office Fax:  [phone]

## 2020-10-30 ENCOUNTER — Other Ambulatory Visit: Payer: Self-pay | Admitting: Internal Medicine

## 2020-11-06 ENCOUNTER — Ambulatory Visit: Payer: Medicare Other | Admitting: Family

## 2020-11-06 ENCOUNTER — Other Ambulatory Visit: Payer: Self-pay

## 2020-11-07 ENCOUNTER — Ambulatory Visit: Payer: Medicare Other | Admitting: Internal Medicine

## 2020-11-07 ENCOUNTER — Encounter: Payer: Self-pay | Admitting: Orthopedic Surgery

## 2020-11-07 ENCOUNTER — Ambulatory Visit (INDEPENDENT_AMBULATORY_CARE_PROVIDER_SITE_OTHER): Payer: Medicare Other | Admitting: Orthopedic Surgery

## 2020-11-07 VITALS — BP 130/70 | HR 56 | Temp 97.5°F | Resp 20 | Ht 65.0 in | Wt 169.0 lb

## 2020-11-07 DIAGNOSIS — R143 Flatulence: Secondary | ICD-10-CM | POA: Diagnosis not present

## 2020-11-07 DIAGNOSIS — R110 Nausea: Secondary | ICD-10-CM | POA: Diagnosis not present

## 2020-11-07 MED ORDER — SIMETHICONE 80 MG PO CHEW: 80.0000 mg | CHEWABLE_TABLET | Freq: Four times a day (QID) | ORAL | 1 refills | Status: DC | PRN

## 2020-11-07 NOTE — Progress Notes (Signed)
Location:    [place]:   [place] Provider:  Windell Moulding, AGNP-C  Gayland Curry, DO  Patient Care Team: Gayland Curry, DO as PCP - General (Geriatric Medicine)  Extended Emergency Contact Information Primary Emergency Contact: Mcclean,Kenneth Address: [address]          [place], [place] [address] [name] of [place] Phone: [phone] Relation: Spouse  Goals of care: Advanced Directive information Advanced Directives 11/07/2020  Does Patient Have a Medical Advance Directive? Yes  Type of Advance Directive Living will  Does patient want to make changes to medical advance directive? No - Patient declined  Copy of [place] in Chart? -     Chief Complaint  Patient presents with  . Acute Visit    Nausea and Gas    HPI:  Pt is a 80 y.o. female seen today for an acute visit for nausea and increased gas.   Since the New Year, she started Weight Watchers. 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  Last Tuesday, she noticed feeling nauseous. In addition, she noticed increased foul smelling gas. Her dietary changes have included eating more vegetables and fruits. She is eating a lot of raw vegetables in order to increase Weight Watchers points. Broccoli, snap peas, lettuce, spinach, cucumbers, and cauliflower are common foods consumed. She does not drink carbonated drinks. Will have abdominal pain prior to passing gas. Pain relieved with flatulence. Admits gas is worse in afternoon after eating lunch. Has not tried any OTC remedies. Nausea intermittent. She cannot recall how often it is happening, or if occurring daily. She is having regular bowel movements. Large one reported 01/31, light brown in color. Last two days bowel movements average and normal in color. She denies dark stools, diarrhea, or vomiting.          Past Medical  History:  Diagnosis Date  . Acute perichondritis of pinna   . Allergic rhinitis due to pollen   . Disorder of bone and cartilage, unspecified   . Encounter for long-term (current) use of other medications   . History of echocardiogram    Echo 9/18: EF 65-70  . History of exercise stress test 06/2017   ETT 9/18: no ischemia; hypertensive BP response  . HLD (hyperlipidemia)   . Osteoarthritis   . Panic disorder   . Unspecified dermatitis due to sun   . Unspecified hypothyroidism    Past Surgical History:  Procedure Laterality Date  . BREAST BIOPSY Right 07/14/2016  . BREAST BIOPSY Right 07/15/2016  . cataract surgery Bilateral 10/12/2016  . childbirth     x2 "blocks"  . COLONOSCOPY     x2  . TONSILLECTOMY  1949   removed    No Known Allergies  Outpatient Encounter Medications as of 11/07/2020  Medication Sig  . ALPRAZolam (XANAX) 0.25 MG tablet Take 1 tablet (0.25 mg total) by mouth 2 (two) times daily as needed (panic attacks).  Marland Kitchen atorvastatin (LIPITOR) 10 MG tablet TAKE 1 TABLET BY MOUTH DAILY FOR CHOLESTEROL  . CALCIUM PO Take 1,000 mg by mouth daily.   . cholecalciferol (VITAMIN D) 1000 UNITS tablet Take 1,000 Units by mouth daily.   . citalopram (CELEXA) 20 MG tablet Take 1 tablet (20 mg total) by mouth daily.  . diclofenac Sodium (VOLTAREN) 1 % GEL Apply 4 g topically 4 (four) times daily as needed.  Marland Kitchen  fish oil-omega-3 fatty acids 1000 MG capsule Take 1 g by mouth daily.   . fluticasone (FLONASE) 50 MCG/ACT nasal spray PLACE 1 SPRAY INTO BOTH NOSTRILS DAILY.  Marland Kitchen levothyroxine (SYNTHROID) 50 MCG tablet TAKE 1 TABLET BY MOUTH ONCE DAILY 30 MINUTES PRIOR TO BREAKFAST FOR THYROID  . Multiple Vitamin (MULTIVITAMIN) tablet Take 1 tablet by mouth daily.  Marland Kitchen nystatin cream (MYCOSTATIN) Apply 1 application topically 2 (two) times daily as needed.  . triamcinolone cream (KENALOG) 0.1 % Apply 1 application topically daily as needed (itching).    No facility-administered encounter  medications on file as of 11/07/2020.    Review of Systems  Constitutional: Positive for appetite change. Negative for activity change and fever.       Started Weight Watchers  Respiratory: Negative for cough, shortness of breath and wheezing.   Cardiovascular: Negative for chest pain and leg swelling.  Gastrointestinal: Positive for abdominal pain. Negative for abdominal distention, blood in stool, constipation, diarrhea, nausea and vomiting.       Foul smelling gas  Psychiatric/Behavioral: Negative for dysphoric mood. The patient is not nervous/anxious.     Immunization History  Administered Date(s) Administered  . Fluad Quad(high Dose 65+) 08/08/2019  . Hepatitis A 02/17/2013  . Hepatitis A, Adult 01/18/2014  . Hepatitis B 07/22/2006, 08/24/2006, 01/21/2007  . Influenza, High Dose Seasonal PF 06/21/2017, 08/01/2018, 08/26/2020  . Influenza,inj,Quad PF,6+ Mos 06/15/2013, 08/03/2014, 07/26/2015, 05/28/2016  . PFIZER(Purple Top)SARS-COV-2 Vaccination 10/17/2019, 11/06/2019, 07/10/2020  . Pneumococcal Conjugate-13 11/29/2014  . Pneumococcal-Unspecified 10/05/2009  . Td 10/06/2007  . Tdap 03/15/2018  . Zoster Recombinat (Shingrix) 02/02/2018, 04/25/2018   Pertinent  Health Maintenance Due  Topic Date Due  . INFLUENZA VACCINE  Completed  . DEXA SCAN  Completed  . PNA vac Low Risk Adult  Completed   Fall Risk  11/07/2020 09/19/2020 08/09/2020 03/21/2020 11/03/2019  Falls in the past year? 1 0 1 0 0  Number falls in past yr: 0 0 0 0 0  Comment - - Tripped on a curb, and knee was bleeding/hurting. 3 Months Ago. - -  Injury with Fall? 0 0 1 0 0   Functional Status Survey:    Vitals:   11/07/20 1310  BP: 130/70  Pulse: (!) 56  Resp: 20  Temp: (!) 97.5 F (36.4 C)  TempSrc: Temporal  SpO2: 99%  Weight: 169 lb (76.7 kg)  Height: 5\' 5"  (1.651 m)   Body mass index is 28.12 kg/m. Physical Exam Vitals reviewed.  Constitutional:      General: She is not in acute distress.     Appearance: Normal appearance.  Cardiovascular:     Rate and Rhythm: Normal rate and regular rhythm.     Pulses: Normal pulses.     Heart sounds: Normal heart sounds. No murmur heard.   Pulmonary:     Effort: Pulmonary effort is normal. No respiratory distress.     Breath sounds: Normal breath sounds. No wheezing.  Abdominal:     General: Bowel sounds are normal. There is no distension.     Palpations: Abdomen is soft. There is no mass.     Tenderness: There is no abdominal tenderness.     Hernia: No hernia is present.  Skin:    General: Skin is warm and dry.     Capillary Refill: Capillary refill takes less than 2 seconds.  Neurological:     Mental Status: She is alert.  Psychiatric:        Mood and Affect: Mood  normal.     Labs reviewed: Recent Labs    03/20/20 0834 09/16/20 0845  NA 136 137  K 4.4 4.5  CL 103 103  CO2 25 27  GLUCOSE 92 87  BUN 22 21  CREATININE 1.08* 0.89  CALCIUM 8.9 9.2   Recent Labs    03/20/20 0834  AST 22  ALT 21  BILITOT 0.6  PROT 6.1   Recent Labs    03/20/20 0834  WBC 4.9  NEUTROABS 2,391  HGB 12.9  HCT 38.7  MCV 93.0  PLT 263   Lab Results  Component Value Date   TSH 2.02 03/20/2020   Lab Results  Component Value Date   HGBA1C 5.2 03/20/2020   Lab Results  Component Value Date   CHOL 117 03/20/2020   HDL 47 (L) 03/20/2020   LDLCALC 56 03/20/2020   TRIG 64 03/20/2020   CHOLHDL 2.5 03/20/2020    Significant Diagnostic Results in last 30 days:  No results found.  Assessment/Plan 1. Flatulence - suspect increased flatulence due to dietary changes - abdominal pain relieved after passing gas - simethicone (GAS-X) 80 MG chewable tablet; Chew 1 tablet (80 mg total) by mouth 4 (four) times daily as needed for flatulence.  Dispense: 30 tablet; Refill: 1  2. Nausea - episodes are random, no reports of vomiting - suspect dietary changes leave her hungry or nauseous at times - having regular bowel movements, no  distention, no pain with palpation - recommend eating small meals throughout the day - may try ginger or peppermint tea to sooth stomach  I provided 22 minutes of face-to-face time during this encounter.     Family/ staff Communication: Plan discussed with patient  Labs/tests ordered:  none

## 2020-11-07 NOTE — Patient Instructions (Signed)
Script for simethicone sent to [facility]   Abdominal Bloating When you have abdominal bloating, your abdomen may feel full, tight, or painful. It may also look bigger than normal or swollen (distended). Common causes of abdominal bloating include:  Swallowing air.  Constipation.  Problems digesting food.  Eating too much.  Irritable bowel syndrome. This is a condition that affects the large intestine.  Lactose intolerance. This is an inability to digest lactose, a natural sugar in dairy products.  Celiac disease. This is a condition that affects the ability to digest gluten, a protein found in some grains.  Gastroparesis. This is a condition that slows down the movement of food in the stomach and small intestine. It is more common in people with diabetes mellitus.  Gastroesophageal reflux disease (GERD). This is a digestive condition that makes stomach acid flow back into the esophagus.  Urinary retention. This means that the body is holding onto urine, and the bladder cannot be emptied all the way. Follow these instructions at home: Eating and drinking  Avoid eating too much.  Try not to swallow air while talking or eating.  Avoid eating while lying down.  Avoid these foods and drinks: ? Foods that cause gas, such as broccoli, cabbage, cauliflower, and baked beans. ? Carbonated drinks. ? Hard candy. ? Chewing gum. Medicines  Take over-the-counter and prescription medicines only as told by your health care provider.  Take probiotic medicines. These medicines contain live bacteria or yeasts that can help digestion.  Take coated peppermint oil capsules. Activity  Try to exercise regularly. Exercise may help to relieve bloating that is caused by gas and relieve constipation. General instructions  Keep all follow-up visits as told by your health care provider. This is important. Contact a health care provider if:  You have nausea and vomiting.  You have  diarrhea.  You have abdominal pain.  You have unusual weight loss or weight gain.  You have severe pain, and medicines do not help. Get help right away if:  You have severe chest pain.  You have trouble breathing.  You have shortness of breath.  You have trouble urinating.  You have darker urine than normal.  You have blood in your stools or have dark, tarry stools. Summary  Abdominal bloating means that the abdomen is swollen.  Common causes of abdominal bloating are swallowing air, constipation, and problems digesting food.  Avoid eating too much and avoid swallowing air.  Avoid foods that cause gas, carbonated drinks, hard candy, and chewing gum. This information is not intended to replace advice given to you by your health care provider. Make sure you discuss any questions you have with your health care provider. Document Revised: 01/09/2019 Document Reviewed: 10/23/2016 Elsevier Patient Education  2021 [employer].

## 2020-11-08 ENCOUNTER — Encounter: Payer: Self-pay | Admitting: Orthopedic Surgery

## 2020-11-08 ENCOUNTER — Ambulatory Visit (INDEPENDENT_AMBULATORY_CARE_PROVIDER_SITE_OTHER): Payer: Medicare Other | Admitting: Orthopedic Surgery

## 2020-11-08 ENCOUNTER — Other Ambulatory Visit: Payer: Self-pay

## 2020-11-08 VITALS — BP 120/90 | HR 59 | Temp 97.9°F | Resp 20 | Ht 65.0 in | Wt 172.2 lb

## 2020-11-08 DIAGNOSIS — M545 Low back pain, unspecified: Secondary | ICD-10-CM

## 2020-11-08 NOTE — Patient Instructions (Signed)
Continue tylenol- 1000 mg by mouth three times a day  Light walking recommended  No bending, pulling or twisting  No heavy lifting  Heating pad or Icy/hot   Acute Back Pain, Adult Acute back pain is sudden and usually short-lived. It is often caused by an injury to the muscles and tissues in the back. The injury may result from:  A muscle or ligament getting overstretched or torn (strained). Ligaments are tissues that connect bones to each other. Lifting something improperly can cause a back strain.  Wear and tear (degeneration) of the spinal disks. Spinal disks are circular tissue that provide cushioning between the bones of the spine (vertebrae).  Twisting motions, such as while playing sports or doing yard work.  A hit to the back.  Arthritis. You may have a physical exam, lab tests, and imaging tests to find the cause of your pain. Acute back pain usually goes away with rest and home care. Follow these instructions at home: Managing pain, stiffness, and swelling  Treatment may include medicines for pain and inflammation that are taken by mouth or applied to the skin, prescription pain medicine, or muscle relaxants. Take over-the-counter and prescription medicines only as told by your health care provider.  Your health care provider may recommend applying ice during the first 24-48 hours after your pain starts. To do this: ? Put ice in a plastic bag. ? Place a towel between your skin and the bag. ? Leave the ice on for 20 minutes, 2-3 times a day.  If directed, apply heat to the affected area as often as told by your health care provider. Use the heat source that your health care provider recommends, such as a moist heat pack or a heating pad. ? Place a towel between your skin and the heat source. ? Leave the heat on for 20-30 minutes. ? Remove the heat if your skin turns bright red. This is especially important if you are unable to feel pain, heat, or cold. You have a greater  risk of getting burned. Activity  Do not stay in bed. Staying in bed for more than 1-2 days can delay your recovery.  Sit up and stand up straight. Avoid leaning forward when you sit or hunching over when you stand. ? If you work at a desk, sit close to it so you do not need to lean over. Keep your chin tucked in. Keep your neck drawn back, and keep your elbows bent at a 90-degree angle (right angle). ? Sit high and close to the steering wheel when you drive. Add lower back (lumbar) support to your car seat, if needed.  Take short walks on even surfaces as soon as you are able. Try to increase the length of time you walk each day.  Do not sit, drive, or stand in one place for more than 30 minutes at a time. Sitting or standing for long periods of time can put stress on your back.  Do not drive or use heavy machinery while taking prescription pain medicine.  Use proper lifting techniques. When you bend and lift, use positions that put less stress on your back: ? Dennis your knees. ? Keep the load close to your body. ? Avoid twisting.  Exercise regularly as told by your health care provider. Exercising helps your back heal faster and helps prevent back injuries by keeping muscles strong and flexible.  Work with a physical therapist to make a safe exercise program, as recommended by your health care  provider. Do any exercises as told by your physical therapist.   Lifestyle  Maintain a healthy weight. Extra weight puts stress on your back and makes it difficult to have good posture.  Avoid activities or situations that make you feel anxious or stressed. Stress and anxiety increase muscle tension and can make back pain worse. Learn ways to manage anxiety and stress, such as through exercise. General instructions  Sleep on a firm mattress in a comfortable position. Try lying on your side with your knees slightly bent. If you lie on your back, put a pillow under your knees.  Follow your  treatment plan as told by your health care provider. This may include: ? Cognitive or behavioral therapy. ? Acupuncture or massage therapy. ? Meditation or yoga. Contact a health care provider if:  You have pain that is not relieved with rest or medicine.  You have increasing pain going down into your legs or buttocks.  Your pain does not improve after 2 weeks.  You have pain at night.  You lose weight without trying.  You have a fever or chills. Get help right away if:  You develop new bowel or bladder control problems.  You have unusual weakness or numbness in your arms or legs.  You develop nausea or vomiting.  You develop abdominal pain.  You feel faint. Summary  Acute back pain is sudden and usually short-lived.  Use proper lifting techniques. When you bend and lift, use positions that put less stress on your back.  Take over-the-counter and prescription medicines and apply heat or ice as directed by your health care provider. This information is not intended to replace advice given to you by your health care provider. Make sure you discuss any questions you have with your health care provider. Document Revised: 06/14/2020 Document Reviewed: 06/14/2020 Elsevier Patient Education  2021 [employer].

## 2020-11-08 NOTE — Progress Notes (Signed)
Location:   [place]:   [place] Provider:  Windell Moulding, AGNP-C  Gayland Curry, DO  Patient Care Team: Gayland Curry, DO as PCP - General (Geriatric Medicine)  Extended Emergency Contact Information Primary Emergency Contact: Bellotti,Kenneth Address: [address]          [place], [place] [address] [name] of [place] Phone: [phone] Relation: Spouse  Goals of care: Advanced Directive information Advanced Directives 11/08/2020  Does Patient Have a Medical Advance Directive? Yes  Type of Advance Directive Living will  Does patient want to make changes to medical advance directive? No - Patient declined  Copy of [place] in Chart? -     Chief Complaint  Patient presents with  . Acute Visit    Pain in the Lumbar of her back     HPI:  Pt is a 80 y.o. female seen today for an acute visit for back pain.   Yesterday evening she injured her lower back after picking up a heavy iron skillet. She instantly felt pain in her lower back after placing skillet down. Rates pain 5/10. Pain described as sharp. Reports taking 1000 mg tylenol last night before bed. This morning she felt better, but as she started to move around her lower back pain returned. In addition, she noticed some pain running down both upper thighs for a few minutes. Upper thigh pain has not returned. She took another dose of 1000 mg tylenol this morning. Reports some relief. Noticed back pain is triggered by sitting on soft surfaces. Prefers firm surfaces at this time. Decided to seek medical attention for unresolved back pain.      Past Medical History:  Diagnosis Date  . Acute perichondritis of pinna   . Allergic rhinitis due to pollen   . Disorder of bone and cartilage, unspecified   . Encounter for long-term (current) use of other medications   . History of echocardiogram    Echo 9/18: EF 65-70  . History of exercise stress test  06/2017   ETT 9/18: no ischemia; hypertensive BP response  . HLD (hyperlipidemia)   . Osteoarthritis   . Panic disorder   . Unspecified dermatitis due to sun   . Unspecified hypothyroidism    Past Surgical History:  Procedure Laterality Date  . BREAST BIOPSY Right 07/14/2016  . BREAST BIOPSY Right 07/15/2016  . cataract surgery Bilateral 10/12/2016  . childbirth     x2 "blocks"  . COLONOSCOPY     x2  . TONSILLECTOMY  1949   removed    No Known Allergies  Outpatient Encounter Medications as of 11/08/2020  Medication Sig  . ALPRAZolam (XANAX) 0.25 MG tablet Take 1 tablet (0.25 mg total) by mouth 2 (two) times daily as needed (panic attacks).  Marland Kitchen atorvastatin (LIPITOR) 10 MG tablet TAKE 1 TABLET BY MOUTH DAILY FOR CHOLESTEROL  . CALCIUM PO Take 1,000 mg by mouth daily.   . cholecalciferol (VITAMIN D) 1000 UNITS tablet Take 1,000 Units by mouth daily.   . citalopram (CELEXA) 20 MG tablet Take 1 tablet (20 mg total) by mouth daily.  . diclofenac Sodium (VOLTAREN) 1 % GEL Apply 4 g topically 4 (four) times daily as needed.  . fish oil-omega-3 fatty acids 1000 MG capsule Take 1 g by mouth daily.   . fluticasone (FLONASE) 50 MCG/ACT nasal spray PLACE 1 SPRAY INTO BOTH NOSTRILS DAILY.  Marland Kitchen levothyroxine (SYNTHROID) 50 MCG tablet TAKE 1 TABLET  BY MOUTH ONCE DAILY 30 MINUTES PRIOR TO BREAKFAST FOR THYROID  . Multiple Vitamin (MULTIVITAMIN) tablet Take 1 tablet by mouth daily.  Marland Kitchen nystatin cream (MYCOSTATIN) Apply 1 application topically 2 (two) times daily as needed.  . simethicone (GAS-X) 80 MG chewable tablet Chew 1 tablet (80 mg total) by mouth 4 (four) times daily as needed for flatulence.  . triamcinolone cream (KENALOG) 0.1 % Apply 1 application topically daily as needed (itching).    No facility-administered encounter medications on file as of 11/08/2020.    Review of Systems  Constitutional: Negative for activity change and appetite change.  Respiratory: Negative for cough, shortness  of breath and wheezing.   Cardiovascular: Negative for chest pain and leg swelling.  Musculoskeletal: Positive for back pain and myalgias.  Psychiatric/Behavioral: Negative for dysphoric mood. The patient is not nervous/anxious.     Immunization History  Administered Date(s) Administered  . Fluad Quad(high Dose 65+) 08/08/2019  . Hepatitis A 02/17/2013  . Hepatitis A, Adult 01/18/2014  . Hepatitis B 07/22/2006, 08/24/2006, 01/21/2007  . Influenza, High Dose Seasonal PF 06/21/2017, 08/01/2018, 08/26/2020  . Influenza,inj,Quad PF,6+ Mos 06/15/2013, 08/03/2014, 07/26/2015, 05/28/2016  . PFIZER(Purple Top)SARS-COV-2 Vaccination 10/17/2019, 11/06/2019, 07/10/2020  . Pneumococcal Conjugate-13 11/29/2014  . Pneumococcal-Unspecified 10/05/2009  . Td 10/06/2007  . Tdap 03/15/2018  . Zoster Recombinat (Shingrix) 02/02/2018, 04/25/2018   Pertinent  Health Maintenance Due  Topic Date Due  . INFLUENZA VACCINE  Completed  . DEXA SCAN  Completed  . PNA vac Low Risk Adult  Completed   Fall Risk  11/07/2020 09/19/2020 08/09/2020 03/21/2020 11/03/2019  Falls in the past year? 1 0 1 0 0  Number falls in past yr: 0 0 0 0 0  Comment - - Tripped on a curb, and knee was bleeding/hurting. 3 Months Ago. - -  Injury with Fall? 0 0 1 0 0   Functional Status Survey:    Vitals:   11/08/20 1345  BP: 120/90  Pulse: (!) 59  Resp: 20  Temp: 97.9 F (36.6 C)  TempSrc: Temporal  SpO2: 98%  Weight: 172 lb 3.2 oz (78.1 kg)  Height: 5\' 5"  (1.651 m)   Body mass index is 28.66 kg/m. Physical Exam Vitals reviewed.  Constitutional:      General: She is not in acute distress. Cardiovascular:     Rate and Rhythm: Normal rate and regular rhythm.     Pulses: Normal pulses.     Heart sounds: Normal heart sounds. No murmur heard.   Pulmonary:     Effort: Pulmonary effort is normal. No respiratory distress.     Breath sounds: Normal breath sounds. No wheezing.  Musculoskeletal:     Cervical back: Normal.      Thoracic back: Normal.     Lumbar back: No swelling, deformity or tenderness. Decreased range of motion.     Right lower leg: No edema.     Left lower leg: No edema.  Skin:    General: Skin is warm and dry.     Capillary Refill: Capillary refill takes less than 2 seconds.  Neurological:     General: No focal deficit present.     Mental Status: She is alert and oriented to person, place, and time.     Sensory: No sensory deficit.     Motor: No weakness.     Gait: Gait normal.  Psychiatric:        Mood and Affect: Mood normal.        Behavior: Behavior normal.  Labs reviewed: Recent Labs    03/20/20 0834 09/16/20 0845  NA 136 137  K 4.4 4.5  CL 103 103  CO2 25 27  GLUCOSE 92 87  BUN 22 21  CREATININE 1.08* 0.89  CALCIUM 8.9 9.2   Recent Labs    03/20/20 0834  AST 22  ALT 21  BILITOT 0.6  PROT 6.1   Recent Labs    03/20/20 0834  WBC 4.9  NEUTROABS 2,391  HGB 12.9  HCT 38.7  MCV 93.0  PLT 263   Lab Results  Component Value Date   TSH 2.02 03/20/2020   Lab Results  Component Value Date   HGBA1C 5.2 03/20/2020   Lab Results  Component Value Date   CHOL 117 03/20/2020   HDL 47 (L) 03/20/2020   LDLCALC 56 03/20/2020   TRIG 64 03/20/2020   CHOLHDL 2.5 03/20/2020    Significant Diagnostic Results in last 30 days:  No results found.  Assessment/Plan 1. Acute midline low back pain without sciatica - back pain began after lifting heavy object, suspect strain - refrain from heavy lifting - Avoid bending, pulling or twisting body movements - recommend tylenol 1000 mg po tid for pain - recommend heating pad for pain - recommend over the counter icy/hot patches for pain - continue sitting on firm surfaces for comfort - contact PCP if pain does not improve in 2-4 weeks - may consider xray and PT as next step  I provided 21 minutes of face-to-face time during this encounter.     Family/ staff Communication: Plan discussed with  patient  Labs/tests ordered:  none

## 2020-11-23 ENCOUNTER — Other Ambulatory Visit: Payer: Self-pay | Admitting: Internal Medicine

## 2020-11-25 ENCOUNTER — Encounter: Payer: Self-pay | Admitting: Internal Medicine

## 2020-12-06 ENCOUNTER — Other Ambulatory Visit: Payer: Medicare Other

## 2020-12-06 ENCOUNTER — Encounter: Payer: Self-pay | Admitting: Internal Medicine

## 2020-12-06 ENCOUNTER — Ambulatory Visit: Payer: Medicare Other

## 2020-12-07 ENCOUNTER — Other Ambulatory Visit: Payer: Self-pay

## 2020-12-07 ENCOUNTER — Ambulatory Visit
Admission: RE | Admit: 2020-12-07 | Discharge: 2020-12-07 | Disposition: A | Discharge status: Home / Self Care | Payer: Medicare Other | Source: Ambulatory Visit | Attending: Family | Admitting: Family

## 2020-12-07 DIAGNOSIS — M8589 Other specified disorders of bone density and structure, multiple sites: Secondary | ICD-10-CM | POA: Diagnosis not present

## 2020-12-07 DIAGNOSIS — Z780 Asymptomatic menopausal state: Secondary | ICD-10-CM | POA: Diagnosis not present

## 2020-12-07 DIAGNOSIS — Z1231 Encounter for screening mammogram for malignant neoplasm of breast: Secondary | ICD-10-CM

## 2020-12-09 ENCOUNTER — Telehealth: Payer: Self-pay | Admitting: *Deleted

## 2020-12-09 NOTE — Telephone Encounter (Signed)
Medication list updated.

## 2020-12-09 NOTE — Telephone Encounter (Signed)
-----   Message from Sandrea Hughs, NP sent at 12/09/2020  9:50 AM EST ----- Bone density indicates Osteopenia on right hip recommend increase vitamin D from 1000 units to 2000 units daily.continue on MVI and weight bearing exercise.

## 2020-12-09 NOTE — Telephone Encounter (Signed)
Patient notified

## 2020-12-09 NOTE — Telephone Encounter (Signed)
Okay.continue with Vitamin D 5000 units if already taking.

## 2020-12-09 NOTE — Telephone Encounter (Signed)
Patient notified and agreed.  Patient stated that she is already taking Vitamin D3 5000 units daily.  Please Advise.

## 2020-12-09 NOTE — Addendum Note (Signed)
Addended by: Rafael Bihari A on: 12/09/2020 11:46 AM   Modules accepted: Orders

## 2021-01-02 ENCOUNTER — Other Ambulatory Visit: Payer: Self-pay | Admitting: Nurse Practitioner

## 2021-01-02 ENCOUNTER — Telehealth: Payer: Self-pay

## 2021-01-02 DIAGNOSIS — E039 Hypothyroidism, unspecified: Secondary | ICD-10-CM

## 2021-01-02 DIAGNOSIS — E785 Hyperlipidemia, unspecified: Secondary | ICD-10-CM

## 2021-01-02 NOTE — Telephone Encounter (Signed)
Patient would like to know if you are recommending the next covid booster for over 12 year olds?  To Patricia Cantu

## 2021-01-02 NOTE — Telephone Encounter (Signed)
There is no official recommendation for the 4th vaccine at this time but if she wants another booster I do not think it would be harmful.

## 2021-01-03 NOTE — Telephone Encounter (Signed)
DWP, no further questions.

## 2021-01-30 ENCOUNTER — Other Ambulatory Visit: Payer: Self-pay | Admitting: Internal Medicine

## 2021-01-30 DIAGNOSIS — F410 Panic disorder [episodic paroxysmal anxiety] without agoraphobia: Secondary | ICD-10-CM

## 2021-01-30 DIAGNOSIS — F411 Generalized anxiety disorder: Secondary | ICD-10-CM

## 2021-02-07 DIAGNOSIS — L300 Nummular dermatitis: Secondary | ICD-10-CM | POA: Diagnosis not present

## 2021-02-07 DIAGNOSIS — L304 Erythema intertrigo: Secondary | ICD-10-CM | POA: Diagnosis not present

## 2021-02-07 DIAGNOSIS — L910 Hypertrophic scar: Secondary | ICD-10-CM | POA: Diagnosis not present

## 2021-02-14 ENCOUNTER — Encounter: Payer: Self-pay | Admitting: Nurse Practitioner

## 2021-02-14 ENCOUNTER — Ambulatory Visit (INDEPENDENT_AMBULATORY_CARE_PROVIDER_SITE_OTHER): Payer: Medicare Other | Admitting: Nurse Practitioner

## 2021-02-14 ENCOUNTER — Other Ambulatory Visit: Payer: Self-pay

## 2021-02-14 ENCOUNTER — Other Ambulatory Visit (HOSPITAL_COMMUNITY): Payer: Self-pay

## 2021-02-14 VITALS — BP 120/78 | HR 68 | Temp 97.8°F | Ht 65.0 in | Wt 170.0 lb

## 2021-02-14 DIAGNOSIS — L03213 Periorbital cellulitis: Secondary | ICD-10-CM | POA: Diagnosis not present

## 2021-02-14 MED ORDER — DOXYCYCLINE HYCLATE 100 MG PO TABS: 100.0000 mg | ORAL_TABLET | Freq: Two times a day (BID) | ORAL | 0 refills | Status: DC

## 2021-02-14 MED ORDER — DOXYCYCLINE HYCLATE 100 MG PO TABS
100.0000 mg | ORAL_TABLET | Freq: Two times a day (BID) | ORAL | 0 refills | Status: DC
  Filled 2021-02-14: qty 14, 7d supply, fill #0

## 2021-02-14 NOTE — Patient Instructions (Addendum)
Zyrtec 10 mg by mouth daily Doxycyline 100 mg by mouth daily twice daily  To use probiotic twice daily -- florastor  Warm compresses to eye three times daily as you can

## 2021-02-20 ENCOUNTER — Encounter: Payer: Self-pay | Admitting: Nurse Practitioner

## 2021-02-20 NOTE — Progress Notes (Signed)
Careteam: Patient Care Team: [name], NP as PCP - General (Geriatric Medicine)  PLACE OF SERVICE:  [facility] Directive information    No Known Allergies  Chief Complaint  Patient presents with  . Acute Visit    Patient would like to have right eye checked. Patient's eye is swollen. It started with itching a couple of days ago and now is swollen. Eye has some redness. Swelling above and below eye.     HPI: Patient is a 80 y.o. female due to redness, swelling around right eye for 3 days. Getting worse and going on a cross country trip  No drainage from the eye, around the eye is very itchy. Took a benadryl which helped the itching.  No pain or drainage.  No visual changes  No redness or drainage from the eye  Taking ketoconazole cream due to a rash she has developed to her abdomen.   Review of Systems:  Review of Systems  Constitutional: Negative for chills, fever and weight loss.  Eyes: Negative for blurred vision, double vision, photophobia, pain, discharge and redness.  Skin: Positive for itching.       Redness and swelling around eye    Past Medical History:  Diagnosis Date  . Acute perichondritis of pinna   . Allergic rhinitis due to pollen   . Disorder of bone and cartilage, unspecified   . Encounter for long-term (current) use of other medications   . History of echocardiogram    Echo 9/18: EF 65-70  . History of exercise stress test 06/2017   ETT 9/18: no ischemia; hypertensive BP response  . HLD (hyperlipidemia)   . Osteoarthritis   . Panic disorder   . Unspecified dermatitis due to sun   . Unspecified hypothyroidism    Past Surgical History:  Procedure Laterality Date  . BREAST BIOPSY Right 07/14/2016  . BREAST BIOPSY Right 07/15/2016  . cataract surgery Bilateral 10/12/2016  . childbirth     x2 "blocks"  . COLONOSCOPY     x2  . TONSILLECTOMY  1949   removed   Social History:   reports that she has never smoked. She  has never used smokeless tobacco. She reports current alcohol use of about 2.0 standard drinks of alcohol per week. She reports that she does not use drugs.  Family History  Problem Relation Age of Onset  . Stroke Mother   . Heart failure Mother 22  . Hypertension Mother   . Heart attack Father 68  . Hyperlipidemia Sister   . Atrial fibrillation Sister   . Thyroid disease Sister   . Heart disease Son   . Thyroid disease Son   . Thyroid disease Daughter   . Atrial fibrillation Brother   . Breast cancer Maternal Grandmother 37  . Colon cancer Neg Hx     Medications: Patient's Medications  New Prescriptions   DOXYCYCLINE (VIBRA-TABS) 100 MG TABLET    Take 1 tablet (100 mg total) by mouth 2 (two) times daily.  Previous Medications   ALPRAZOLAM (XANAX) 0.25 MG TABLET    Take 1 tablet (0.25 mg total) by mouth 2 (two) times daily as needed (panic attacks).   ATORVASTATIN (LIPITOR) 10 MG TABLET    TAKE 1 TABLET BY MOUTH DAILY FOR CHOLESTEROL   CALCIUM PO    Take 1,000 mg by mouth daily.    CHOLECALCIFEROL (VITAMIN D3) 125 MCG (5000 UT) CAPS    Take one capsule by mouth once daily.  CITALOPRAM (CELEXA) 20 MG TABLET    TAKE 1 TABLET (20 MG TOTAL) BY MOUTH DAILY.   DICLOFENAC SODIUM (VOLTAREN) 1 % GEL    Apply 4 g topically 4 (four) times daily as needed.   FISH OIL-OMEGA-3 FATTY ACIDS 1000 MG CAPSULE    Take 1 g by mouth daily.    FLUCONAZOLE (DIFLUCAN) 200 MG TABLET    Take 200 mg by mouth once a week. For 6 weeks   FLUTICASONE (FLONASE) 50 MCG/ACT NASAL SPRAY    PLACE 1 SPRAY INTO BOTH NOSTRILS DAILY.   KETOCONAZOLE (NIZORAL) 2 % CREAM    Apply 1 application topically as needed for irritation.   LEVOTHYROXINE (SYNTHROID) 50 MCG TABLET    TAKE 1 TABLET BY MOUTH ONCE DAILY 30 MINUTES PRIOR TO BREAKFAST FOR THYROID   MULTIPLE VITAMIN (MULTIVITAMIN) TABLET    Take 1 tablet by mouth daily.   NYSTATIN CREAM (MYCOSTATIN)    Apply 1 application topically 2 (two) times daily as needed.    SIMETHICONE (GAS-X) 80 MG CHEWABLE TABLET    Chew 1 tablet (80 mg total) by mouth 4 (four) times daily as needed for flatulence.   TRIAMCINOLONE CREAM (KENALOG) 0.1 %    Apply 1 application topically daily as needed (itching).   Modified Medications   No medications on file  Discontinued Medications   No medications on file    Physical Exam:  Vitals:   02/14/21 1515  BP: 120/78  Pulse: 68  Temp: 97.8 F (36.6 C)  TempSrc: Temporal  SpO2: 96%  Weight: 170 lb (77.1 kg)  Height: 5\' 5"  (1.651 m)   Body mass index is 28.29 kg/m. Wt Readings from Last 3 Encounters:  02/14/21 170 lb (77.1 kg)  11/08/20 172 lb 3.2 oz (78.1 kg)  11/07/20 169 lb (76.7 kg)    Physical Exam Constitutional:      Appearance: Normal appearance.  HENT:     Head: Normocephalic and atraumatic. Right periorbital erythema (with swelling) present. No raccoon eyes, contusion or laceration.  Cardiovascular:     Rate and Rhythm: Normal rate and regular rhythm.  Pulmonary:     Effort: Pulmonary effort is normal.     Breath sounds: Normal breath sounds.  Skin:    General: Skin is warm and dry.  Neurological:     Mental Status: She is alert.     Labs reviewed: Basic Metabolic Panel: Recent Labs    03/20/20 0834 09/16/20 0845  NA 136 137  K 4.4 4.5  CL 103 103  CO2 25 27  GLUCOSE 92 87  BUN 22 21  CREATININE 1.08* 0.89  CALCIUM 8.9 9.2  TSH 2.02  --    Liver Function Tests: Recent Labs    03/20/20 0834  AST 22  ALT 21  BILITOT 0.6  PROT 6.1   No results for input(s): LIPASE, AMYLASE in the last 8760 hours. No results for input(s): AMMONIA in the last 8760 hours. CBC: Recent Labs    03/20/20 0834  WBC 4.9  NEUTROABS 2,391  HGB 12.9  HCT 38.7  MCV 93.0  PLT 263   Lipid Panel: Recent Labs    03/20/20 0834  CHOL 117  HDL 47*  LDLCALC 56  TRIG 64  CHOLHDL 2.5   TSH: Recent Labs    03/20/20 0834  TSH 2.02   A1C: Lab Results  Component Value Date   HGBA1C 5.2  03/20/2020     Assessment/Plan 1. Periorbital cellulitis of right eye -to use warm  epsom salt compresses TID ~20 mins as able.  - doxycycline (VIBRA-TABS) 100 MG tablet; Take 1 tablet (100 mg total) by mouth 2 (two) times daily.  Dispense: 14 tablet; Refill: 0 -seek medical attention if swelling or redness worsens or fever occurs    Carold Eisner K. [place], [facility] [phone]

## 2021-03-11 ENCOUNTER — Ambulatory Visit (INDEPENDENT_AMBULATORY_CARE_PROVIDER_SITE_OTHER): Payer: Medicare Other | Admitting: Adult Health

## 2021-03-11 ENCOUNTER — Other Ambulatory Visit: Payer: Medicare Other

## 2021-03-11 ENCOUNTER — Encounter: Payer: Self-pay | Admitting: Adult Health

## 2021-03-11 ENCOUNTER — Other Ambulatory Visit: Payer: Self-pay

## 2021-03-11 DIAGNOSIS — F411 Generalized anxiety disorder: Secondary | ICD-10-CM

## 2021-03-11 DIAGNOSIS — F410 Panic disorder [episodic paroxysmal anxiety] without agoraphobia: Secondary | ICD-10-CM

## 2021-03-11 DIAGNOSIS — E785 Hyperlipidemia, unspecified: Secondary | ICD-10-CM

## 2021-03-11 DIAGNOSIS — E039 Hypothyroidism, unspecified: Secondary | ICD-10-CM

## 2021-03-11 MED ORDER — ALPRAZOLAM 0.25 MG PO TABS: 0.2500 mg | ORAL_TABLET | Freq: Two times a day (BID) | ORAL | 0 refills | Status: DC | PRN
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
## 2021-03-12 ENCOUNTER — Ambulatory Visit: Payer: Medicare Other | Admitting: Family Medicine

## 2021-03-12 ENCOUNTER — Other Ambulatory Visit: Payer: Medicare Other

## 2021-03-12 ENCOUNTER — Other Ambulatory Visit: Payer: Self-pay

## 2021-03-12 DIAGNOSIS — E785 Hyperlipidemia, unspecified: Secondary | ICD-10-CM | POA: Diagnosis not present

## 2021-03-12 DIAGNOSIS — E039 Hypothyroidism, unspecified: Secondary | ICD-10-CM | POA: Diagnosis not present

## 2021-03-13 LAB — COMPLETE METABOLIC PANEL WITH GFR
AG Ratio: 1.9 (calc) (ref 1.0–2.5)
ALT: 21 U/L (ref 6–29)
AST: 24 U/L (ref 10–35)
Albumin: 4.1 g/dL (ref 3.6–5.1)
Alkaline phosphatase (APISO): 59 U/L (ref 37–153)
BUN: 15 mg/dL (ref 7–25)
CO2: 30 mmol/L (ref 20–32)
Calcium: 9.2 mg/dL (ref 8.6–10.4)
Chloride: 103 mmol/L (ref 98–110)
Creat: 0.89 mg/dL (ref 0.60–0.93)
GFR, Est African American: 72 mL/min/{1.73_m2} (ref >=60)
GFR, Est Non African American: 62 mL/min/{1.73_m2} (ref >=60)
Globulin: 2.2 g/dL (calc) (ref 1.9–3.7)
Glucose, Bld: 89 mg/dL (ref 65–99)
Potassium: 4.4 mmol/L (ref 3.5–5.3)
Sodium: 138 mmol/L (ref 135–146)
Total Bilirubin: 0.8 mg/dL (ref 0.2–1.2)
Total Protein: 6.3 g/dL (ref 6.1–8.1)

## 2021-03-13 LAB — CBC WITH DIFFERENTIAL/PLATELET
Absolute Monocytes: 589 cells/uL (ref 200–950)
Basophils Absolute: 59 cells/uL (ref 0–200)
Basophils Relative: 1.1 %
Eosinophils Absolute: 308 cells/uL (ref 15–500)
Eosinophils Relative: 5.7 %
HCT: 41.6 % (ref 35.0–45.0)
Hemoglobin: 13.7 g/dL (ref 11.7–15.5)
Lymphs Abs: 1771 cells/uL (ref 850–3900)
MCH: 31.2 pg (ref 27.0–33.0)
MCHC: 32.9 g/dL (ref 32.0–36.0)
MCV: 94.8 fL (ref 80.0–100.0)
MPV: 10.1 fL (ref 7.5–12.5)
Monocytes Relative: 10.9 %
Neutro Abs: 2673 cells/uL (ref 1500–7800)
Neutrophils Relative %: 49.5 %
Platelets: 281 10*3/uL (ref 140–400)
RBC: 4.39 10*6/uL (ref 3.80–5.10)
RDW: 12.9 % (ref 11.0–15.0)
Total Lymphocyte: 32.8 %
WBC: 5.4 10*3/uL (ref 3.8–10.8)

## 2021-03-13 LAB — LIPID PANEL
Cholesterol: 151 mg/dL (ref <200)
HDL: 65 mg/dL (ref >=50)
LDL Cholesterol (Calc): 65 mg/dL (calc)
Non-HDL Cholesterol (Calc): 86 mg/dL (calc) (ref <130)
Total CHOL/HDL Ratio: 2.3 (calc) (ref <5.0)
Triglycerides: 128 mg/dL (ref <150)

## 2021-03-13 LAB — TSH: TSH: 3.29 mIU/L (ref 0.40–4.50)

## 2021-03-14 ENCOUNTER — Other Ambulatory Visit: Payer: Self-pay

## 2021-03-14 ENCOUNTER — Encounter: Payer: Self-pay | Admitting: Nurse Practitioner

## 2021-03-14 ENCOUNTER — Ambulatory Visit (INDEPENDENT_AMBULATORY_CARE_PROVIDER_SITE_OTHER): Payer: Medicare Other | Admitting: Nurse Practitioner

## 2021-03-14 VITALS — BP 126/78 | HR 60 | Temp 97.1°F | Ht 65.0 in | Wt 172.0 lb

## 2021-03-14 DIAGNOSIS — E663 Overweight: Secondary | ICD-10-CM | POA: Diagnosis not present

## 2021-03-14 DIAGNOSIS — G4700 Insomnia, unspecified: Secondary | ICD-10-CM

## 2021-03-14 DIAGNOSIS — R918 Other nonspecific abnormal finding of lung field: Secondary | ICD-10-CM | POA: Diagnosis not present

## 2021-03-14 DIAGNOSIS — E039 Hypothyroidism, unspecified: Secondary | ICD-10-CM | POA: Diagnosis not present

## 2021-03-14 DIAGNOSIS — E785 Hyperlipidemia, unspecified: Secondary | ICD-10-CM | POA: Diagnosis not present

## 2021-03-14 DIAGNOSIS — E559 Vitamin D deficiency, unspecified: Secondary | ICD-10-CM

## 2021-03-14 DIAGNOSIS — M170 Bilateral primary osteoarthritis of knee: Secondary | ICD-10-CM

## 2021-03-14 DIAGNOSIS — F410 Panic disorder [episodic paroxysmal anxiety] without agoraphobia: Secondary | ICD-10-CM

## 2021-03-14 DIAGNOSIS — F411 Generalized anxiety disorder: Secondary | ICD-10-CM
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
## 2021-03-20 ENCOUNTER — Ambulatory Visit: Payer: Medicare Other | Admitting: Internal Medicine

## 2021-03-31 ENCOUNTER — Other Ambulatory Visit: Payer: Self-pay

## 2021-03-31 ENCOUNTER — Ambulatory Visit
Admission: RE | Admit: 2021-03-31 | Discharge: 2021-03-31 | Disposition: A | Discharge status: Home / Self Care | Payer: Medicare Other | Source: Ambulatory Visit | Attending: Nurse Practitioner | Admitting: Nurse Practitioner

## 2021-03-31 DIAGNOSIS — I700 Atherosclerosis of aorta: Secondary | ICD-10-CM | POA: Diagnosis not present

## 2021-03-31 DIAGNOSIS — R911 Solitary pulmonary nodule: Secondary | ICD-10-CM | POA: Diagnosis not present

## 2021-03-31 DIAGNOSIS — R918 Other nonspecific abnormal finding of lung field: Secondary | ICD-10-CM

## 2021-04-04 DIAGNOSIS — C44722 Squamous cell carcinoma of skin of right lower limb, including hip: Secondary | ICD-10-CM | POA: Diagnosis not present

## 2021-04-04 DIAGNOSIS — D485 Neoplasm of uncertain behavior of skin: Secondary | ICD-10-CM | POA: Diagnosis not present

## 2021-04-04 DIAGNOSIS — L820 Inflamed seborrheic keratosis: Secondary | ICD-10-CM | POA: Diagnosis not present

## 2021-04-18 ENCOUNTER — Ambulatory Visit (INDEPENDENT_AMBULATORY_CARE_PROVIDER_SITE_OTHER): Payer: Medicare Other | Admitting: Nurse Practitioner

## 2021-04-18 ENCOUNTER — Other Ambulatory Visit: Payer: Self-pay

## 2021-04-18 ENCOUNTER — Encounter: Payer: Self-pay | Admitting: Nurse Practitioner

## 2021-04-18 VITALS — BP 150/80 | HR 77 | Temp 97.3°F | Ht 65.0 in | Wt 178.0 lb

## 2021-04-18 DIAGNOSIS — R350 Frequency of micturition: Secondary | ICD-10-CM | POA: Diagnosis not present

## 2021-04-18 LAB — POCT URINALYSIS DIPSTICK
Bilirubin, UA: NEGATIVE
Glucose, UA: NEGATIVE
Ketones, UA: NEGATIVE
Nitrite, UA: NEGATIVE
Protein, UA: NEGATIVE
Spec Grav, UA: 1.01 (ref 1.010–1.025)
Urobilinogen, UA: 0.2 E.U./dL
pH, UA: 6 (ref 5.0–8.0)

## 2021-04-18 MED ORDER — AMOXICILLIN-POT CLAVULANATE 875-125 MG PO TABS: 1.0000 | ORAL_TABLET | Freq: Two times a day (BID) | ORAL | 0 refills | Status: DC

## 2021-04-18 NOTE — Progress Notes (Signed)
Careteam: Patient Care Team: [name], NP as PCP - General (Geriatric Medicine)  PLACE OF SERVICE:  [place] Directive information    No Known Allergies  Chief Complaint  Patient presents with   Acute Visit    Patient having urinary urgency. Urinary frequency. No burning on urination.Symptoms started yesterday     HPI: Patient is a 80 y.o. female here today due to urinary frequency.  Reports she does not have a hx of UTI but feeling like something is different.  Reports blood pressure is generally not elevated.   Having urinary frequency and urgency. No abdomina pain, bladder pain or pain with urination. Large amounts of urinary incontinence.   All of this was abrupt onset.   Has been drinking more water.   Review of Systems:  Review of Systems  Constitutional:  Negative for chills and fever.  Genitourinary:  Positive for frequency and urgency. Negative for dysuria, flank pain and hematuria.   Past Medical History:  Diagnosis Date   Acute perichondritis of pinna    Allergic rhinitis due to pollen    Disorder of bone and cartilage, unspecified    Encounter for long-term (current) use of other medications    History of echocardiogram    Echo 9/18: EF 65-70   History of exercise stress test 06/2017   ETT 9/18: no ischemia; hypertensive BP response   HLD (hyperlipidemia)    Osteoarthritis    Panic disorder    Unspecified dermatitis due to sun    Unspecified hypothyroidism    Past Surgical History:  Procedure Laterality Date   BREAST BIOPSY Right 07/14/2016   BREAST BIOPSY Right 07/15/2016   cataract surgery Bilateral 10/12/2016   childbirth     x2 "blocks"   COLONOSCOPY     x2   TONSILLECTOMY  1949   removed   Social History:   reports that she has never smoked. She has never used smokeless tobacco. She reports current alcohol use of about 2.0 standard drinks of alcohol per week. She reports that she does not use drugs.  Family  History  Problem Relation Age of Onset   Stroke Mother    Heart failure Mother 51   Hypertension Mother    Heart attack Father 61   Hyperlipidemia Sister    Atrial fibrillation Sister    Thyroid disease Sister    Heart disease Son    Thyroid disease Son    Thyroid disease Daughter    Atrial fibrillation Brother    Breast cancer Maternal Grandmother 92   Colon cancer Neg Hx     Medications: Patient's Medications  New Prescriptions   No medications on file  Previous Medications   ALPRAZOLAM (XANAX) 0.25 MG TABLET    Take 1 tablet (0.25 mg total) by mouth 2 (two) times daily as needed (panic attacks).   ATORVASTATIN (LIPITOR) 10 MG TABLET    TAKE 1 TABLET BY MOUTH DAILY FOR CHOLESTEROL   CALCIUM PO    Take 1,000 mg by mouth daily.    CHOLECALCIFEROL (VITAMIN D3) 125 MCG (5000 UT) CAPS    Take one capsule by mouth once daily.   CITALOPRAM (CELEXA) 20 MG TABLET    TAKE 1 TABLET (20 MG TOTAL) BY MOUTH DAILY.   DICLOFENAC SODIUM (VOLTAREN) 1 % GEL    Apply 4 g topically 4 (four) times daily as needed.   FISH OIL-OMEGA-3 FATTY ACIDS 1000 MG CAPSULE    Take 1 g by mouth daily.  FLUTICASONE (FLONASE) 50 MCG/ACT NASAL SPRAY    PLACE 1 SPRAY INTO BOTH NOSTRILS DAILY.   KETOCONAZOLE (NIZORAL) 2 % CREAM    Apply 1 application topically as needed for irritation.   LEVOTHYROXINE (SYNTHROID) 50 MCG TABLET    TAKE 1 TABLET BY MOUTH ONCE DAILY 30 MINUTES PRIOR TO BREAKFAST FOR THYROID   MULTIPLE VITAMIN (MULTIVITAMIN) TABLET    Take 1 tablet by mouth daily.   NYSTATIN CREAM (MYCOSTATIN)    Apply 1 application topically 2 (two) times daily as needed.   SIMETHICONE (GAS-X) 80 MG CHEWABLE TABLET    Chew 1 tablet (80 mg total) by mouth 4 (four) times daily as needed for flatulence.   TRIAMCINOLONE CREAM (KENALOG) 0.1 %    Apply 1 application topically daily as needed (itching).   Modified Medications   No medications on file  Discontinued Medications   FLUCONAZOLE (DIFLUCAN) 200 MG TABLET    Take  200 mg by mouth once a week. For 6 weeks    Physical Exam:  Vitals:   04/18/21 1500  BP: (!) 150/80  Pulse: 77  Temp: (!) 97.3 F (36.3 C)  TempSrc: Temporal  SpO2: 96%  Weight: 178 lb (80.7 kg)  Height: 5\' 5"  (1.651 m)   Body mass index is 29.62 kg/m. Wt Readings from Last 3 Encounters:  04/18/21 178 lb (80.7 kg)  03/14/21 172 lb (78 kg)  03/11/21 174 lb (78.9 kg)    Physical Exam Abdominal:     General: Abdomen is flat. There is no distension.     Palpations: Abdomen is soft.     Tenderness: There is no abdominal tenderness.  Skin:    General: Skin is warm and dry.  Neurological:     Mental Status: She is alert.  Psychiatric:        Mood and Affect: Mood normal.    Labs reviewed: Basic Metabolic Panel: Recent Labs    09/16/20 0845 03/12/21 0847  NA 137 138  K 4.5 4.4  CL 103 103  CO2 27 30  GLUCOSE 87 89  BUN 21 15  CREATININE 0.89 0.89  CALCIUM 9.2 9.2  TSH  --  3.29   Liver Function Tests: Recent Labs    03/12/21 0847  AST 24  ALT 21  BILITOT 0.8  PROT 6.3   No results for input(s): LIPASE, AMYLASE in the last 8760 hours. No results for input(s): AMMONIA in the last 8760 hours. CBC: Recent Labs    03/12/21 0847  WBC 5.4  NEUTROABS 2,673  HGB 13.7  HCT 41.6  MCV 94.8  PLT 281   Lipid Panel: Recent Labs    03/12/21 0847  CHOL 151  HDL 65  LDLCALC 65  TRIG 128  CHOLHDL 2.3   TSH: Recent Labs    03/12/21 0847  TSH 3.29   A1C: Lab Results  Component Value Date   HGBA1C 5.2 03/20/2020     Assessment/Plan 1. Urinary frequency With urgency and worsening incontinence.  - amoxicillin-clavulanate (AUGMENTIN) 875-125 MG tablet; Take 1 tablet by mouth 2 (two) times daily.  Dispense: 14 tablet; Refill: 0 - POC Urinalysis Dipstick- noted to have leukocytosis and blood in urine - Culture, Urine   Elexia Friedt K. [place], [facility] [phone]

## 2021-04-18 NOTE — Patient Instructions (Signed)
Increase hydration  Start augmentin by mouth twice daily for 7 days  Start probiotic or yogurt to help prevent GI upset

## 2021-04-20 LAB — URINE CULTURE
MICRO NUMBER:: 12126093
SPECIMEN QUALITY:: ADEQUATE

## 2021-04-21 ENCOUNTER — Other Ambulatory Visit: Payer: Self-pay

## 2021-04-21 ENCOUNTER — Ambulatory Visit: Payer: Self-pay | Admitting: Family

## 2021-04-21 MED ORDER — SACCHAROMYCES BOULARDII 250 MG PO CAPS: 250.0000 mg | ORAL_CAPSULE | Freq: Two times a day (BID) | ORAL | 0 refills | Status: DC

## 2021-04-30 DIAGNOSIS — C44722 Squamous cell carcinoma of skin of right lower limb, including hip: Secondary | ICD-10-CM | POA: Diagnosis not present

## 2021-05-08 ENCOUNTER — Other Ambulatory Visit: Payer: Self-pay | Admitting: Nurse Practitioner

## 2021-05-14 DIAGNOSIS — C44729 Squamous cell carcinoma of skin of left lower limb, including hip: Secondary | ICD-10-CM | POA: Diagnosis not present

## 2021-05-16 DIAGNOSIS — D1801 Hemangioma of skin and subcutaneous tissue: Secondary | ICD-10-CM | POA: Diagnosis not present

## 2021-05-16 DIAGNOSIS — L821 Other seborrheic keratosis: Secondary | ICD-10-CM | POA: Diagnosis not present

## 2021-05-16 DIAGNOSIS — L905 Scar conditions and fibrosis of skin: Secondary | ICD-10-CM | POA: Diagnosis not present

## 2021-05-16 DIAGNOSIS — D225 Melanocytic nevi of trunk: Secondary | ICD-10-CM | POA: Diagnosis not present

## 2021-05-19 ENCOUNTER — Other Ambulatory Visit: Payer: Self-pay | Admitting: Nurse Practitioner

## 2021-05-19 DIAGNOSIS — Z9842 Cataract extraction status, left eye: Secondary | ICD-10-CM | POA: Diagnosis not present

## 2021-05-19 DIAGNOSIS — H5211 Myopia, right eye: Secondary | ICD-10-CM | POA: Diagnosis not present

## 2021-05-19 DIAGNOSIS — Z9841 Cataract extraction status, right eye: Secondary | ICD-10-CM | POA: Diagnosis not present

## 2021-06-19 ENCOUNTER — Ambulatory Visit (INDEPENDENT_AMBULATORY_CARE_PROVIDER_SITE_OTHER): Payer: Medicare Other | Admitting: Family

## 2021-06-19 ENCOUNTER — Encounter: Payer: Self-pay | Admitting: Family

## 2021-06-19 ENCOUNTER — Other Ambulatory Visit: Payer: Self-pay

## 2021-06-19 VITALS — BP 110/70 | HR 68 | Temp 96.7°F | Ht 65.0 in | Wt 181.2 lb

## 2021-06-19 DIAGNOSIS — Z23 Encounter for immunization: Secondary | ICD-10-CM

## 2021-06-19 DIAGNOSIS — M79672 Pain in left foot: Secondary | ICD-10-CM

## 2021-06-19 DIAGNOSIS — Z0289 Encounter for other administrative examinations: Secondary | ICD-10-CM | POA: Diagnosis not present

## 2021-06-19 DIAGNOSIS — Z111 Encounter for screening for respiratory tuberculosis: Secondary | ICD-10-CM | POA: Diagnosis not present

## 2021-06-19 NOTE — Progress Notes (Addendum)
Provider: Sherena Machorro FNP-C  Patricia Chandler, NP  Patient Care Team: Patricia Chandler, NP as PCP - General (Geriatric Medicine)  Extended Emergency Contact Information Primary Emergency Contact: Gehlhausen,Kenneth Address: [address]          [place], [place] [address] Patricia Cantu of [place] Phone: [phone] Relation: Spouse  Code Status:  Full Code  Goals of care: Advanced Directive information Advanced Directives 06/19/2021  Does Patient Have a Medical Advance Directive? Yes  Type of Advance Directive Living will  Does patient want to make changes to medical advance directive? No - Patient declined  Copy of [place] in Chart? -     Chief Complaint  Patient presents with   Medical Management of Chronic Issues    Patient returns to the office to have forms filled out. She would like a TB test and her left leg looked at.    HPI:  Pt is a 79 y.o. 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 denies any fever,chills,headache,dizziness,vision changes,fatigue,chest tightness,palpitation,chest pain or shortness of breath.  Also requires TB screening.    Also complains of left lateral foot pain.No redness or swelling but tender to touch.   Past Medical History:  Diagnosis Date   Acute perichondritis of pinna    Allergic rhinitis due to pollen    Disorder of bone and cartilage, unspecified    Encounter for long-term (current) use of other medications    History of echocardiogram    Echo 9/18: EF 65-70   History of exercise stress test 06/2017   ETT 9/18: no ischemia; hypertensive BP response   HLD (hyperlipidemia)    Osteoarthritis    Panic disorder    Unspecified dermatitis due to sun    Unspecified hypothyroidism    Past Surgical History:  Procedure  Laterality Date   BREAST BIOPSY Right 07/14/2016   BREAST BIOPSY Right 07/15/2016   cataract surgery Bilateral 10/12/2016   childbirth     x2 "blocks"   COLONOSCOPY     x2   TONSILLECTOMY  1949   removed    No Known Allergies  Outpatient Encounter Medications as of 06/19/2021  Medication Sig   ALPRAZolam (XANAX) 0.25 MG tablet Take 1 tablet (0.25 mg total) by mouth 2 (two) times daily as needed (panic attacks).   atorvastatin (LIPITOR) 10 MG tablet TAKE 1 TABLET BY MOUTH DAILY FOR CHOLESTEROL   CALCIUM PO Take 1,000 mg by mouth daily.    Cholecalciferol (VITAMIN D3) 125 MCG (5000 UT) CAPS Take one capsule by mouth once daily.   citalopram (CELEXA) 20 MG tablet TAKE 1 TABLET (20 MG TOTAL) BY MOUTH DAILY.   diclofenac Sodium (VOLTAREN) 1 % GEL Apply 4 g topically 4 (four) times daily as needed.   fish oil-omega-3 fatty acids 1000 MG capsule Take 1 g by mouth daily.    fluticasone (FLONASE) 50 MCG/ACT nasal spray PLACE 1 SPRAY INTO BOTH NOSTRILS DAILY.   ketoconazole (NIZORAL) 2 % cream Apply 1 application topically as needed for irritation.   levothyroxine (SYNTHROID) 50 MCG tablet TAKE 1 TABLET BY MOUTH ONCE DAILY 30 MINUTES PRIOR TO BREAKFAST FOR THYROID   Multiple Vitamin (MULTIVITAMIN) tablet Take 1 tablet by mouth daily.   nystatin cream (MYCOSTATIN) Apply 1 application topically 2 (two) times daily as needed.   saccharomyces boulardii (FLORASTOR)  250 MG capsule Take 1 capsule (250 mg total) by mouth 2 (two) times daily.   simethicone (GAS-X) 80 MG chewable tablet Chew 1 tablet (80 mg total) by mouth 4 (four) times daily as needed for flatulence.   triamcinolone cream (KENALOG) 0.1 % Apply 1 application topically daily as needed (itching).    [DISCONTINUED] amoxicillin-clavulanate (AUGMENTIN) 875-125 MG tablet Take 1 tablet by mouth 2 (two) times daily.   No facility-administered encounter medications on file as of 06/19/2021.    Review of Systems  Constitutional:  Negative for  appetite change, chills, fatigue, fever and unexpected weight change.  HENT:  Negative for congestion, dental problem, ear discharge, ear pain, facial swelling, hearing loss, nosebleeds, postnasal drip, rhinorrhea, sinus pressure, sinus pain, sneezing, sore throat, tinnitus and trouble swallowing.   Eyes:  Negative for pain, discharge, redness, itching and visual disturbance.  Respiratory:  Negative for cough, chest tightness, shortness of breath and wheezing.   Cardiovascular:  Negative for chest pain, palpitations and leg swelling.  Gastrointestinal:  Negative for abdominal distention, abdominal pain, blood in stool, constipation, diarrhea, nausea and vomiting.  Endocrine: Negative for cold intolerance, heat intolerance, polydipsia, polyphagia and polyuria.  Genitourinary:  Negative for difficulty urinating, dysuria, flank pain, frequency and urgency.  Musculoskeletal:  Positive for arthralgias. Negative for back pain, gait problem, joint swelling, myalgias, neck pain and neck stiffness.       Left lateral foot pain   Skin:  Negative for color change, pallor, rash and wound.  Neurological:  Negative for dizziness, syncope, speech difficulty, weakness, light-headedness, numbness and headaches.  Hematological:  Does not bruise/bleed easily.  Psychiatric/Behavioral:  Negative for agitation, behavioral problems, confusion, hallucinations, self-injury, sleep disturbance and suicidal ideas. The patient is not nervous/anxious.    Immunization History  Administered Date(s) Administered   Fluad Quad(high Dose 65+) 08/08/2019, 06/19/2021   Hepatitis A 02/17/2013   Hepatitis A, Adult 01/18/2014   Hepatitis B 07/22/2006, 08/24/2006, 01/21/2007   Influenza, High Dose Seasonal PF 06/21/2017, 08/01/2018, 08/26/2020   Influenza,inj,Quad PF,6+ Mos 06/15/2013, 08/03/2014, 07/26/2015, 05/28/2016   PFIZER(Purple Top)SARS-COV-2 Vaccination 10/17/2019, 11/06/2019, 07/10/2020, 01/03/2021   Pneumococcal  Conjugate-13 11/29/2014   Pneumococcal-Unspecified 10/05/2009   Td 10/06/2007   Tdap 03/15/2018   Zoster Recombinat (Shingrix) 02/02/2018, 04/25/2018   Pertinent  Health Maintenance Due  Topic Date Due   INFLUENZA VACCINE  Completed   DEXA SCAN  Completed   PNA vac Low Risk Adult  Completed   Fall Risk  06/19/2021 11/07/2020 09/19/2020 08/09/2020 03/21/2020  Falls in the past year? 0 1 0 1 0  Number falls in past yr: 0 0 0 0 0  Comment - - - Tripped on a curb, and knee was bleeding/hurting. 3 Months Ago. -  Injury with Fall? - 0 0 1 0  Follow up Falls evaluation completed - - - -   Functional Status Survey:    Vitals:   06/19/21 1300 06/19/21 1342  BP: (!) 160/80 110/70  Pulse: 68   Temp: (!) 96.7 F (35.9 C)   SpO2: 97%   Weight: 181 lb 3.2 oz (82.2 kg)   Height: '5\' 5"'$  (1.651 m)    Body mass index is 30.15 kg/m. Physical Exam Vitals reviewed.  Constitutional:      General: She is not in acute distress.    Appearance: Normal appearance. She is normal weight. She is not ill-appearing or diaphoretic.  HENT:     Head: Normocephalic.     Right Ear: Tympanic membrane, ear canal and  external ear normal. There is no impacted cerumen.     Left Ear: Tympanic membrane, ear canal and external ear normal. There is no impacted cerumen.     Nose: Nose normal. No congestion or rhinorrhea.     Mouth/Throat:     Mouth: Mucous membranes are moist.     Pharynx: Oropharynx is clear. No oropharyngeal exudate or posterior oropharyngeal erythema.  Eyes:     General: No scleral icterus.       Right eye: No discharge.        Left eye: No discharge.     Extraocular Movements: Extraocular movements intact.     Conjunctiva/sclera: Conjunctivae normal.     Pupils: Pupils are equal, round, and reactive to light.     Comments: Vision: Right 20/20 Left 20/25    Neck:     Vascular: No carotid bruit.  Cardiovascular:     Rate and Rhythm: Normal rate and regular rhythm.     Pulses: Normal pulses.      Heart sounds: Normal heart sounds. No murmur heard.   No friction rub. No gallop.  Pulmonary:     Effort: Pulmonary effort is normal. No respiratory distress.     Breath sounds: Normal breath sounds. No wheezing, rhonchi or rales.  Chest:     Chest wall: No tenderness.  Abdominal:     General: Bowel sounds are normal. There is no distension.     Palpations: Abdomen is soft. There is no mass.     Tenderness: There is no abdominal tenderness. There is no right CVA tenderness, left CVA tenderness, guarding or rebound.  Musculoskeletal:        General: No swelling. Normal range of motion.     Cervical back: Normal range of motion. No rigidity or tenderness.     Right lower leg: No edema.     Left lower leg: No edema.     Right foot: Normal range of motion and normal capillary refill. No bunion or crepitus. Normal pulse.     Left foot: Normal range of motion and normal capillary refill. Tenderness present. No bunion or crepitus. Normal pulse.       Legs:  Lymphadenopathy:     Cervical: No cervical adenopathy.  Skin:    General: Skin is warm and dry.     Coloration: Skin is not pale.     Findings: No bruising, erythema, lesion or rash.  Neurological:     Mental Status: She is alert and oriented to person, place, and time.     Cranial Nerves: No cranial nerve deficit.     Sensory: No sensory deficit.     Motor: No weakness.     Coordination: Coordination normal.     Gait: Gait normal.  Psychiatric:        Mood and Affect: Mood normal.        Speech: Speech normal.        Behavior: Behavior normal.        Thought Content: Thought content normal.        Judgment: Judgment normal.    Labs reviewed: Recent Labs    09/16/20 0845 03/12/21 0847  NA 137 138  K 4.5 4.4  CL 103 103  CO2 27 30  GLUCOSE 87 89  BUN 21 15  CREATININE 0.89 0.89  CALCIUM 9.2 9.2   Recent Labs    03/12/21 0847  AST 24  ALT 21  BILITOT 0.8  PROT 6.3   Recent Labs  03/12/21 0847  WBC  5.4  NEUTROABS 2,673  HGB 13.7  HCT 41.6  MCV 94.8  PLT 281   Lab Results  Component Value Date   TSH 3.29 03/12/2021   Lab Results  Component Value Date   HGBA1C 5.2 03/20/2020   Lab Results  Component Value Date   CHOL 151 03/12/2021   HDL 65 03/12/2021   LDLCALC 65 03/12/2021   TRIG 128 03/12/2021   CHOLHDL 2.3 03/12/2021    Significant Diagnostic Results in last 30 days:  No results found.  Assessment/Plan 1. Encounter for completion of form with patient Form completed. Awaiting Quantiferon -TB gold plus results.placed on provider's folder.will call when results return then patient can pick up form at the front desk.   2. Screening-pulmonary TB Made aware results will taker 2-5 days.Lab Tech later notified provider results no longer send out of states but will be resulted here in [place] which will take probably 2-3 days.  - QuantiFERON-TB Gold Plus  3. Need for influenza vaccination Afebrile  Flut shot administered by CMA no acute reaction reported.  - Flu Vaccine QUAD High Dose(Fluad)  4. Left foot pain Lateral foot tenderer to palpation without any erythema. Will send to Podiatrist for further evaluation.  - Ambulatory referral to Podiatry   Family/ staff Communication: Reviewed plan of care with patient verbalized understanding   Labs/tests ordered:  - QuantiFERON-TB Gold Plus  Next Appointment: As needed if symptoms worsen or fail to improve    Sandrea Hughs, NP

## 2021-06-20 NOTE — Addendum Note (Signed)
Addended byMarlowe Sax C on: 06/20/2021 08:52 AM   Modules accepted: Orders

## 2021-06-23 LAB — QUANTIFERON-TB GOLD PLUS
Mitogen-NIL: 2.57 IU/mL
NIL: 0.03 IU/mL
QuantiFERON-TB Gold Plus: NEGATIVE
TB1-NIL: 0 IU/mL
TB2-NIL: 0 IU/mL

## 2021-07-07 ENCOUNTER — Ambulatory Visit: Payer: Medicare Other | Admitting: Podiatry

## 2021-07-07 ENCOUNTER — Encounter: Payer: Self-pay | Admitting: Podiatry

## 2021-07-07 ENCOUNTER — Ambulatory Visit (INDEPENDENT_AMBULATORY_CARE_PROVIDER_SITE_OTHER): Payer: Medicare Other

## 2021-07-07 ENCOUNTER — Other Ambulatory Visit: Payer: Self-pay

## 2021-07-07 DIAGNOSIS — M779 Enthesopathy, unspecified: Secondary | ICD-10-CM | POA: Diagnosis not present

## 2021-07-07 DIAGNOSIS — M7672 Peroneal tendinitis, left leg: Secondary | ICD-10-CM

## 2021-07-07 DIAGNOSIS — M19079 Primary osteoarthritis, unspecified ankle and foot: Secondary | ICD-10-CM | POA: Diagnosis not present

## 2021-07-07 MED ORDER — MELOXICAM 15 MG PO TABS: 15.0000 mg | ORAL_TABLET | Freq: Every day | ORAL | 0 refills | Status: DC

## 2021-07-07 NOTE — Progress Notes (Signed)
  Subjective:  Patient ID: Patricia Cantu, female    DOB: March 17, 1941,   MRN: [id number]  Chief Complaint  Patient presents with   Foot Pain    Left foot pain located at the lateral and top of foot x 2 years. Pt states she can not describ the pain. No edema, tingling or numbness.     80 y.o. female presents for left dorsal foot pain that has been present for several years. She also has lateral foot pain that started more recently. Has not tried any treatments. Denies numbness but relates some tingling on the top of the foot. Denies any other pedal complaints. Denies n/v/f/c.   Past Medical History:  Diagnosis Date   Acute perichondritis of pinna    Allergic rhinitis due to pollen    Disorder of bone and cartilage, unspecified    Encounter for long-term (current) use of other medications    History of echocardiogram    Echo 9/18: EF 65-70   History of exercise stress test 06/2017   ETT 9/18: no ischemia; hypertensive BP response   HLD (hyperlipidemia)    Osteoarthritis    Panic disorder    Unspecified dermatitis due to sun    Unspecified hypothyroidism     Objective:  Physical Exam: Vascular: DP/PT pulses 2/4 bilateral. CFT <3 seconds. Normal hair growth on digits. No edema.  Skin. No lacerations or abrasions bilateral feet.  Musculoskeletal: MMT 5/5 bilateral lower extremities in DF, PF, Inversion and Eversion. Deceased ROM in DF of ankle joint. Tenderness over dorsal medial midfoot. Tender with palpation of base of fifth metatarsal and peroneal tendon insertion. Tender with eversion of foot.  Neurological: Sensation intact to light touch.   Assessment:   1. Peroneal tendonitis, left   2. Arthritis of midfoot      Plan:  Patient was evaluated and treated and all questions answered. X-rays reviewed and discussed with patient. Discussed peroneal tendinitis and midfoot arthritis and treatment options at length with patient Discussed stretching exercises and provided  handout. Prescription for meloxicam provided Dispensed Tri-Lock ankle brace. Patient to return in 6 to 8 weeks or sooner if symptoms fail to improve or worsen.   Lorenda Peck, DPM

## 2021-07-07 NOTE — Patient Instructions (Signed)
Peroneal Tendinopathy Rehab Ask your health care provider which exercises are safe for you. Do exercises exactly as told by your health care provider and adjust them as directed. It is normal to feel mild stretching, pulling, tightness, or discomfort as you do these exercises. Stop right away if you feel sudden pain or your pain gets worse. Do not begin these exercises until told by your health care provider. Stretching and range-of-motion exercises These exercises warm up your muscles and joints and improve the movement and flexibility of your ankle. These exercises also help to relieve pain and stiffness. Gastroc and soleus stretch, standing This is an exercise in which you stand on a step and use your body weight to stretch your calf muscles. To do this exercise: Stand on the edge of a step on the ball of your left / right foot. The ball of your foot is on the walking surface, right under your toes. Keep your other foot firmly on the same step. Hold on to the wall, a railing, or a chair for balance. Slowly lift your other foot, allowing your body weight to press your left / right heel down over the edge of the step. You should feel a stretch in your left / right calf (gastrocnemius and soleus). Hold this position for __________ seconds. Return both feet to the step. Repeat this exercise with a slight bend in your left / right knee. Repeat __________ times with your left / right knee straight and __________ times with your left / right knee bent. Complete this exercise __________ times a day. Strengthening exercises These exercises build strength and endurance in your foot and ankle. Endurance is the ability to use your muscles for a long time, even after they get tired. Ankle dorsiflexion with band  Secure a rubber exercise band or tube to an object, such as a table leg, that will not move when the band is pulled. Secure the other end of the band around your left / right foot. Sit on the  floor, facing the object with your left / right leg extended. The band or tube should be slightly tense when your foot is relaxed. Slowly flex your left / right ankle and toes to bring your foot toward you (dorsiflexion). Hold this position for __________ seconds. Let the band or tube slowly pull your foot back to the starting position. Repeat __________ times. Complete this exercise __________ times a day. Ankle eversion Sit on the floor with your legs straight out in front of you. Loop a rubber exercise band or tube around the ball of your left / right foot. The ball of your foot is on the walking surface, right under your toes. Hold the ends of the band in your hands, or secure the band to a stable object. The band or tube should be slightly tense when your foot is relaxed. Slowly push your foot outward, away from your other leg (eversion). Hold this position for __________ seconds. Slowly return your foot to the starting position. Repeat __________ times. Complete this exercise __________ times a day. Plantar flexion, standing This exercise is sometimes called standing heel raise. Stand with your feet shoulder-width apart. Place your hands on a wall or table to steady yourself as needed, but try not to use it for support. Keep your weight spread evenly over the width of your feet while you slowly rise up on your toes (plantar flexion). If told by your health care provider: Shift your weight toward your left / right   leg until you feel challenged. Stand on your left / right leg only. Hold this position for __________ seconds. Repeat __________ times. Complete this exercise __________ times a day. Single leg stand Without shoes, stand near a railing or in a doorway. You may hold on to the railing or door frame as needed. Stand on your left / right foot. Keep your big toe down on the floor and try to keep your arch lifted. Do not roll to the outside of your foot. If this exercise is too  easy, you can try it with your eyes closed or while standing on a pillow. Hold this position for __________ seconds. Repeat __________ times. Complete this exercise __________ times a day. This information is not intended to replace advice given to you by your health care provider. Make sure you discuss any questions you have with your health care provider. Document Revised: 01/10/2019 Document Reviewed: 01/10/2019 Elsevier Patient Education  [place].

## 2021-07-08 ENCOUNTER — Other Ambulatory Visit: Payer: Self-pay | Admitting: Podiatry

## 2021-07-08 DIAGNOSIS — M7672 Peroneal tendinitis, left leg: Secondary | ICD-10-CM

## 2021-08-11 ENCOUNTER — Encounter: Payer: Medicare Other | Admitting: Internal Medicine

## 2021-08-12 ENCOUNTER — Encounter: Payer: Self-pay | Admitting: Nurse Practitioner

## 2021-08-12 ENCOUNTER — Other Ambulatory Visit: Payer: Self-pay

## 2021-08-12 ENCOUNTER — Ambulatory Visit (INDEPENDENT_AMBULATORY_CARE_PROVIDER_SITE_OTHER): Payer: Medicare Other | Admitting: Nurse Practitioner

## 2021-08-12 ENCOUNTER — Telehealth: Payer: Self-pay

## 2021-08-12 DIAGNOSIS — Z0000 Encounter for general adult medical examination without abnormal findings: Secondary | ICD-10-CM

## 2021-08-12 NOTE — Progress Notes (Signed)
Subjective:   Patricia Cantu is a 80 y.o. female who presents for Medicare Annual (Subsequent) preventive examination.  Review of Systems     Cardiac Risk Factors include: obesity (BMI >30kg/m2);advanced age (>77men, >84 women);dyslipidemia     Objective:    There were no vitals filed for this visit. There is no height or weight on file to calculate BMI.  Advanced Directives 08/12/2021 06/19/2021 03/14/2021 11/08/2020 11/07/2020 09/19/2020 08/09/2020  Does Patient Have a Medical Advance Directive? Yes Yes Yes Yes Yes Yes Yes  Type of Advance Directive Living will Living will Living will Living will Living will Living will Living will  Does patient want to make changes to medical advance directive? No - Patient declined No - Patient declined No - Patient declined No - Patient declined No - Patient declined No - Patient declined No - Patient declined  Copy of [place] in Chart? - - - - - - -    Current Medications (verified) Outpatient Encounter Medications as of 08/12/2021  Medication Sig   ALPRAZolam (XANAX) 0.25 MG tablet Take 1 tablet (0.25 mg total) by mouth 2 (two) times daily as needed (panic attacks).   atorvastatin (LIPITOR) 10 MG tablet TAKE 1 TABLET BY MOUTH DAILY FOR CHOLESTEROL   CALCIUM PO Take 1,000 mg by mouth daily.    Cholecalciferol (VITAMIN D3) 125 MCG (5000 UT) CAPS Take one capsule by mouth once daily.   citalopram (CELEXA) 20 MG tablet TAKE 1 TABLET (20 MG TOTAL) BY MOUTH DAILY.   diclofenac Sodium (VOLTAREN) 1 % GEL Apply 4 g topically 4 (four) times daily as needed.   fish oil-omega-3 fatty acids 1000 MG capsule Take 1 g by mouth daily.    fluticasone (FLONASE) 50 MCG/ACT nasal spray PLACE 1 SPRAY INTO BOTH NOSTRILS DAILY.   ketoconazole (NIZORAL) 2 % cream Apply 1 application topically as needed for irritation.   levothyroxine (SYNTHROID) 50 MCG tablet TAKE 1 TABLET BY MOUTH ONCE DAILY 30 MINUTES PRIOR TO BREAKFAST FOR THYROID   nystatin cream  (MYCOSTATIN) Apply 1 application topically 2 (two) times daily as needed.   simethicone (GAS-X) 80 MG chewable tablet Chew 1 tablet (80 mg total) by mouth 4 (four) times daily as needed for flatulence.   triamcinolone cream (KENALOG) 0.1 % Apply 1 application topically daily as needed (itching).    meloxicam (MOBIC) 15 MG tablet Take 1 tablet (15 mg total) by mouth daily. (Patient not taking: Reported on 08/12/2021)   Multiple Vitamin (MULTIVITAMIN) tablet Take 1 tablet by mouth daily.   [DISCONTINUED] saccharomyces boulardii (FLORASTOR) 250 MG capsule Take 1 capsule (250 mg total) by mouth 2 (two) times daily.   No facility-administered encounter medications on file as of 08/12/2021.    Allergies (verified) Patient has no known allergies.   History: Past Medical History:  Diagnosis Date   Acute perichondritis of pinna    Allergic rhinitis due to pollen    Disorder of bone and cartilage, unspecified    Encounter for long-term (current) use of other medications    History of echocardiogram    Echo 9/18: EF 65-70   History of exercise stress test 06/2017   ETT 9/18: no ischemia; hypertensive BP response   HLD (hyperlipidemia)    Osteoarthritis    Panic disorder    Unspecified dermatitis due to sun    Unspecified hypothyroidism    Past Surgical History:  Procedure Laterality Date   BREAST BIOPSY Right 07/14/2016   BREAST BIOPSY Right 07/15/2016  cataract surgery Bilateral 10/12/2016   childbirth     x2 "blocks"   COLONOSCOPY     x2   TONSILLECTOMY  1949   removed   Family History  Problem Relation Age of Onset   Stroke Mother    Heart failure Mother 58   Hypertension Mother    Heart attack Father 60   Hyperlipidemia Sister    Atrial fibrillation Sister    Thyroid disease Sister    Heart disease Son    Thyroid disease Son    Thyroid disease Daughter    Atrial fibrillation Brother    Breast cancer Maternal Grandmother 92   Colon cancer Neg Hx    Social History    Socioeconomic History   Marital status: Married    Spouse name: Not on file   Number of children: Not on file   Years of education: Not on file   Highest education level: Not on file  Occupational History   Occupation: retired  Tobacco Use   Smoking status: Never   Smokeless tobacco: Never  Vaping Use   Vaping Use: Never used  Substance and Sexual Activity   Alcohol use: Yes    Alcohol/week: 2.0 standard drinks    Types: 2 Glasses of wine per week   Drug use: No   Sexual activity: Yes  Other Topics Concern   Not on file  Social History Narrative   She is retired. 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 She is married and has 3 children and 6 grandchildren. She is originally from [place]. She has lived in [place]. She lived in [place] for 7 years. She denies tobacco abuse. She drinks alcohol on occasion.   Social Determinants of Health   Financial Resource Strain: Not on file  Food Insecurity: Not on file  Transportation Needs: Not on file  Physical Activity: Not on file  Stress: Not on file  Social Connections: Not on file    Tobacco Counseling Counseling given: Not Answered   Clinical Intake:  Pre-visit preparation completed: Yes  Pain : No/denies pain     BMI - recorded: 30.8 Nutritional Status: BMI > 30  Obese Diabetes: No  How often do you need to have someone help you when you read instructions, pamphlets, or other written materials from your doctor or pharmacy?: 1 - Never  Diabetic?no         Activities of Daily Living In your present state of health, do you have any difficulty performing the following activities: 08/12/2021  Hearing? N  Vision? N  Difficulty concentrating or making decisions? N  Walking or climbing stairs? N  Dressing or bathing? N  Doing errands, shopping? N  Preparing Food and eating ? N  Using the Toilet? N  In the past six months, have you accidently  leaked urine? Y  Do you have problems with loss of bowel control? N  Managing your Medications? N  Managing your Finances? N  Housekeeping or managing your Housekeeping? N  Some recent data might be hidden    Patient Care Team: [name], NP as PCP - General (Geriatric Medicine)  Indicate any recent Medical Services you may have received from other than [facility] providers in the past year (date may be approximate).     Assessment:   This is a routine wellness examination for Patricia Cantu.  Hearing/Vision screen Hearing Screening - Comments:: Patient has no hearing problems. Vision Screening -  Comments:: Patient wears reading glasses. Patient has had eye exam within past year, Patient goes to [facility]  Dietary issues and exercise activities discussed: Current Exercise Habits: Home exercise routine, Type of exercise: walking;exercise ball, Time (Minutes): 45, Frequency (Times/Week): 4, Weekly Exercise (Minutes/Week): 180   Goals Addressed   None    Depression Screen PHQ 2/9 Scores 08/12/2021 09/19/2020 08/09/2020 03/21/2020 09/21/2019 08/08/2019 08/01/2018  PHQ - 2 Score 0 0 0 0 0 0 0    Fall Risk Fall Risk  08/12/2021 06/19/2021 11/07/2020 09/19/2020 08/09/2020  Falls in the past year? 0 0 1 0 1  Number falls in past yr: 0 0 0 0 0  Comment - - - - Tripped on a curb, and knee was bleeding/hurting. 3 Months Ago.  Injury with Fall? 0 - 0 0 1  Risk for fall due to : No Fall Risks - - - -  Follow up Falls evaluation completed Falls evaluation completed - - -    FALL RISK PREVENTION PERTAINING TO THE HOME:  Any stairs in or around the home? Yes  If so, are there any without handrails? No  Home free of loose throw rugs in walkways, pet beds, electrical cords, etc? Yes  Adequate lighting in your home to reduce risk of falls? Yes   ASSISTIVE DEVICES UTILIZED TO PREVENT FALLS:  Life alert? No  Use of a cane, walker or w/c? No  Grab bars in the bathroom? Yes  Shower chair  or bench in shower? No  Elevated toilet seat or a handicapped toilet? Yes   TIMED UP AND GO:  Was the test performed? No .    Cognitive Function: MMSE - Mini Mental State Exam 08/08/2019 08/01/2018 12/17/2016 11/28/2015  Not completed: - - - (No Data)  Orientation to time 5 5 5 5   Orientation to Place 5 5 5 5   Registration 3 3 3 3   Attention/ Calculation 5 5 5 5   Recall 3 3 3 2   Language- name 2 objects 2 2 2 2   Language- repeat 1 1 1 1   Language- follow 3 step command 3 3 3 3   Language- read & follow direction 1 1 1 1   Write a sentence 1 1 1 1   Copy design 1 1 1 1   Total score 30 30 30 29      6CIT Screen 08/12/2021 08/09/2020  What Year? 0 points 0 points  What month? 0 points 0 points  What time? 0 points 0 points  Count back from 20 0 points 2 points  Months in reverse 0 points 0 points  Repeat phrase 0 points 0 points  Total Score 0 2    Immunizations Immunization History  Administered Date(s) Administered   Fluad Quad(high Dose 65+) 08/08/2019, 06/19/2021   Hepatitis A 02/17/2013   Hepatitis A, Adult 01/18/2014   Hepatitis B 07/22/2006, 08/24/2006, 01/21/2007   Influenza, High Dose Seasonal PF 06/21/2017, 08/01/2018, 08/26/2020   Influenza,inj,Quad PF,6+ Mos 06/15/2013, 08/03/2014, 07/26/2015, 05/28/2016   PFIZER(Purple Top)SARS-COV-2 Vaccination 10/17/2019, 11/06/2019, 07/10/2020, 01/03/2021   Pfizer Covid-19 Vaccine Bivalent Booster 35yrs & up 08/08/2021   Pneumococcal Conjugate-13 11/29/2014   Pneumococcal-Unspecified 10/05/2009   Td 10/06/2007   Tdap 03/15/2018   Zoster Recombinat (Shingrix) 02/02/2018, 04/25/2018    TDAP status: Up to date  Flu Vaccine status: Up to date  Pneumococcal vaccine status: Up to date  Covid-19 vaccine status: Completed vaccines  Qualifies for Shingles Vaccine? Yes   Zostavax completed Yes   Shingrix Completed?: Yes  Screening Tests Health Maintenance  Topic Date Due   Pneumonia Vaccine 51+ Years old (2 - PPSV23 if  available, else PCV20) 11/30/2015   TETANUS/TDAP  03/15/2028   INFLUENZA VACCINE  Completed   DEXA SCAN  Completed   COVID-19 Vaccine  Completed   Hepatitis C Screening  Completed   Zoster Vaccines- Shingrix  Completed   HPV VACCINES  Aged Out    Health Maintenance  Health Maintenance Due  Topic Date Due   Pneumonia Vaccine 75+ Years old (2 - PPSV23 if available, else PCV20) 11/30/2015    Colorectal cancer screening: No longer required.   Mammogram status: Completed 12/07/20. Repeat every year  Bone Density status: Completed 12/07/20. Results reflect: Bone density results: OSTEOPENIA. Repeat every 2 years.  Lung Cancer Screening: (Low Dose CT Chest recommended if Age 58-80 years, 30 pack-year currently smoking OR have quit w/in 15years.) does not qualify.   Lung Cancer Screening Referral: na  Additional Screening:  Hepatitis C Screening: does qualify; Completed   Vision Screening: Recommended annual ophthalmology exams for early detection of glaucoma and other disorders of the eye. Is the patient up to date with their annual eye exam?  Yes  Who is the provider or what is the name of the office in which the patient attends annual eye exams? [facility] If pt is not established with a provider, would they like to be referred to a provider to establish care? No .   Dental Screening: Recommended annual dental exams for proper oral hygiene  Community Resource Referral / Chronic Care Management: CRR required this visit?  No   CCM required this visit?  No      Plan:     I have personally reviewed and noted the following in the patient's chart:   Medical and social history Use of alcohol, tobacco or illicit drugs  Current medications and supplements including opioid prescriptions.  Functional ability and status Nutritional status Physical activity Advanced directives List of other physicians Hospitalizations, surgeries, and ER visits in previous 12  months Vitals Screenings to include cognitive, depression, and falls Referrals and appointments  In addition, I have reviewed and discussed with patient certain preventive protocols, quality metrics, and best practice recommendations. A written personalized care plan for preventive services as well as general preventive health recommendations were provided to patient.     [name], NP   08/12/2021    Virtual Visit via Telephone Note  I connected withNAME@ on 08/12/21 at  3:45 PM EST by telephone and verified that I am speaking with the correct person using two identifiers.  Location: Patient: home Provider: [facility]   I discussed the limitations, risks, security and privacy concerns of performing an evaluation and management service by telephone and the availability of in person appointments. I also discussed with the patient that there may be a patient responsible charge related to this service. The patient expressed understanding and agreed to proceed.   I discussed the assessment and treatment plan with the patient. The patient was provided an opportunity to ask questions and all were answered. The patient agreed with the plan and demonstrated an understanding of the instructions.   The patient was advised to call back or seek an in-person evaluation if the symptoms worsen or if the condition fails to improve as anticipated.  I provided 15 minutes of non-face-to-face time during this encounter.  Carlos American. Harle Battiest Avs printed and mailed

## 2021-08-12 NOTE — Patient Instructions (Signed)
Patricia Cantu , Thank you for taking time to come for your Medicare Wellness Visit. I appreciate your ongoing commitment to your health goals. Please review the following plan we discussed and let me know if I can assist you in the future.   Screening recommendations/referrals: Colonoscopy up to date Mammogram up to date Bone Density up to date Recommended yearly ophthalmology/optometry visit for glaucoma screening and checkup Recommended yearly dental visit for hygiene and checkup  Vaccinations: Influenza vaccine up to date Pneumococcal vaccine up to date Tdap vaccine up to date Shingles vaccine up to date    Advanced directives: on file.   Conditions/risks identified: advance age.   Next appointment: yearly for AWV   Preventive Care 37 Years and Older, Female Preventive care refers to lifestyle choices and visits with your health care provider that can promote health and wellness. What does preventive care include? A yearly physical exam. This is also called an annual well check. Dental exams once or twice a year. Routine eye exams. Ask your health care provider how often you should have your eyes checked. Personal lifestyle choices, including: Daily care of your teeth and gums. Regular physical activity. Eating a healthy diet. Avoiding tobacco and drug use. Limiting alcohol use. Practicing safe sex. Taking low-dose aspirin every day. Taking vitamin and mineral supplements as recommended by your health care provider. What happens during an annual well check? The services and screenings done by your health care provider during your annual well check will depend on your age, overall health, lifestyle risk factors, and family history of disease. Counseling  Your health care provider may ask you questions about your: Alcohol use. Tobacco use. Drug use. Emotional well-being. Home and relationship well-being. Sexual activity. Eating habits. History of falls. Memory and  ability to understand (cognition). Work and work Statistician. Reproductive health. Screening  You may have the following tests or measurements: Height, weight, and BMI. Blood pressure. Lipid and cholesterol levels. These may be checked every 5 years, or more frequently if you are over 54 years old. Skin check. Lung cancer screening. You may have this screening every year starting at age 93 if you have a 30-pack-year history of smoking and currently smoke or have quit within the past 15 years. Fecal occult blood test (FOBT) of the stool. You may have this test every year starting at age 22. Flexible sigmoidoscopy or colonoscopy. You may have a sigmoidoscopy every 5 years or a colonoscopy every 10 years starting at age 66. Hepatitis C blood test. Hepatitis B blood test. Sexually transmitted disease (STD) testing. Diabetes screening. This is done by checking your blood sugar (glucose) after you have not eaten for a while (fasting). You may have this done every 1-3 years. Bone density scan. This is done to screen for osteoporosis. You may have this done starting at age 98. Mammogram. This may be done every 1-2 years. Talk to your health care provider about how often you should have regular mammograms. Talk with your health care provider about your test results, treatment options, and if necessary, the need for more tests. Vaccines  Your health care provider may recommend certain vaccines, such as: Influenza vaccine. This is recommended every year. Tetanus, diphtheria, and acellular pertussis (Tdap, Td) vaccine. You may need a Td booster every 10 years. Zoster vaccine. You may need this after age 56. Pneumococcal 13-valent conjugate (PCV13) vaccine. One dose is recommended after age 19. Pneumococcal polysaccharide (PPSV23) vaccine. One dose is recommended after age 71. Talk to your  health care provider about which screenings and vaccines you need and how often you need them. This information is  not intended to replace advice given to you by your health care provider. Make sure you discuss any questions you have with your health care provider. Document Released: 10/18/2015 Document Revised: 06/10/2016 Document Reviewed: 07/23/2015 Elsevier Interactive Patient Education  2017 [employer] Prevention in the Home Falls can cause injuries. They can happen to people of all ages. There are many things you can do to make your home safe and to help prevent falls. What can I do on the outside of my home? Regularly fix the edges of walkways and driveways and fix any cracks. Remove anything that might make you trip as you walk through a door, such as a raised step or threshold. Trim any bushes or trees on the path to your home. Use bright outdoor lighting. Clear any walking paths of anything that might make someone trip, such as rocks or tools. Regularly check to see if handrails are loose or broken. Make sure that both sides of any steps have handrails. Any raised decks and porches should have guardrails on the edges. Have any leaves, snow, or ice cleared regularly. Use sand or salt on walking paths during winter. Clean up any spills in your garage right away. This includes oil or grease spills. What can I do in the bathroom? Use night lights. Install grab bars by the toilet and in the tub and shower. Do not use towel bars as grab bars. Use non-skid mats or decals in the tub or shower. If you need to sit down in the shower, use a plastic, non-slip stool. Keep the floor dry. Clean up any water that spills on the floor as soon as it happens. Remove soap buildup in the tub or shower regularly. Attach bath mats securely with double-sided non-slip rug tape. Do not have throw rugs and other things on the floor that can make you trip. What can I do in the bedroom? Use night lights. Make sure that you have a light by your bed that is easy to reach. Do not use any sheets or blankets that  are too big for your bed. They should not hang down onto the floor. Have a firm chair that has side arms. You can use this for support while you get dressed. Do not have throw rugs and other things on the floor that can make you trip. What can I do in the kitchen? Clean up any spills right away. Avoid walking on wet floors. Keep items that you use a lot in easy-to-reach places. If you need to reach something above you, use a strong step stool that has a grab bar. Keep electrical cords out of the way. Do not use floor polish or wax that makes floors slippery. If you must use wax, use non-skid floor wax. Do not have throw rugs and other things on the floor that can make you trip. What can I do with my stairs? Do not leave any items on the stairs. Make sure that there are handrails on both sides of the stairs and use them. Fix handrails that are broken or loose. Make sure that handrails are as long as the stairways. Check any carpeting to make sure that it is firmly attached to the stairs. Fix any carpet that is loose or worn. Avoid having throw rugs at the top or bottom of the stairs. If you do have throw rugs, attach them  to the floor with carpet tape. Make sure that you have a light switch at the top of the stairs and the bottom of the stairs. If you do not have them, ask someone to add them for you. What else can I do to help prevent falls? Wear shoes that: Do not have high heels. Have rubber bottoms. Are comfortable and fit you well. Are closed at the toe. Do not wear sandals. If you use a stepladder: Make sure that it is fully opened. Do not climb a closed stepladder. Make sure that both sides of the stepladder are locked into place. Ask someone to hold it for you, if possible. Clearly mark and make sure that you can see: Any grab bars or handrails. First and last steps. Where the edge of each step is. Use tools that help you move around (mobility aids) if they are needed. These  include: Canes. Walkers. Scooters. Crutches. Turn on the lights when you go into a dark area. Replace any light bulbs as soon as they burn out. Set up your furniture so you have a clear path. Avoid moving your furniture around. If any of your floors are uneven, fix them. If there are any pets around you, be aware of where they are. Review your medicines with your doctor. Some medicines can make you feel dizzy. This can increase your chance of falling. Ask your doctor what other things that you can do to help prevent falls. This information is not intended to replace advice given to you by your health care provider. Make sure you discuss any questions you have with your health care provider. Document Released: 07/18/2009 Document Revised: 02/27/2016 Document Reviewed: 10/26/2014 Elsevier Interactive Patient Education  2017 [employer].

## 2021-08-12 NOTE — Progress Notes (Signed)
This service is provided via telemedicine  No vital signs collected/recorded due to the encounter was a telemedicine visit.   Location of patient (ex: home, work):  Home  Patient consents to a telephone visit:  Yes, see encounter dated 08/12/2021  Location of the provider (ex: office, home):  [place]  Name of any referring provider:  N/A  Names of all persons participating in the telemedicine service and their role in the encounter:  Sherrie Mustache, Nurse Practitioner, Carroll Kinds, CMA, and patient.   Time spent on call:  11 minutes with medical assistant

## 2021-08-12 NOTE — Telephone Encounter (Signed)
[name], iglesia are scheduled for a virtual visit with your provider today.    Just as we do with appointments in the office, we must obtain your consent to participate.  Your consent will be active for this visit and any virtual visit you may have with one of our providers in the next 365 days.    If you have a MyChart account, I can also send a copy of this consent to you electronically.  All virtual visits are billed to your insurance company just like a traditional visit in the office.  As this is a virtual visit, video technology does not allow for your provider to perform a traditional examination.  This may limit your provider's ability to fully assess your condition.  If your provider identifies any concerns that need to be evaluated in person or the need to arrange testing such as labs, EKG, etc, we will make arrangements to do so.    Although advances in technology are sophisticated, we cannot ensure that it will always work on either your end or our end.  If the connection with a video visit is poor, we may have to switch to a telephone visit.  With either a video or telephone visit, we are not always able to ensure that we have a secure connection.   I need to obtain your verbal consent now.   Are you willing to proceed with your visit today?   Patricia Cantu has provided verbal consent on 08/12/2021 for a virtual visit (video or telephone).   Patricia Cantu, [facility] 08/12/2021  3:47 PM

## 2021-08-13 ENCOUNTER — Encounter: Payer: Medicare Other | Admitting: Family

## 2021-08-20 ENCOUNTER — Ambulatory Visit: Payer: Medicare Other | Admitting: Podiatry

## 2021-09-06 ENCOUNTER — Other Ambulatory Visit: Payer: Self-pay | Admitting: Nurse Practitioner

## 2021-09-06 DIAGNOSIS — F410 Panic disorder [episodic paroxysmal anxiety] without agoraphobia: Secondary | ICD-10-CM

## 2021-09-08 NOTE — Telephone Encounter (Signed)
Patient has request refill on medication "Citalopram 20mg ". Patient medication last refill was 01/30/2021. Patient medication has High Risk Warnings. Medication pend and sent to PCP Dewaine Oats Carlos American, NP for approval. Please Advise.

## 2021-09-10 ENCOUNTER — Other Ambulatory Visit: Payer: Medicare Other

## 2021-09-15 ENCOUNTER — Ambulatory Visit: Payer: Medicare Other | Admitting: Nurse Practitioner

## 2021-09-25 DIAGNOSIS — D225 Melanocytic nevi of trunk: Secondary | ICD-10-CM | POA: Diagnosis not present

## 2021-09-25 DIAGNOSIS — L02426 Furuncle of left lower limb: Secondary | ICD-10-CM | POA: Diagnosis not present

## 2021-09-25 DIAGNOSIS — L821 Other seborrheic keratosis: Secondary | ICD-10-CM | POA: Diagnosis not present

## 2021-09-25 DIAGNOSIS — L814 Other melanin hyperpigmentation: Secondary | ICD-10-CM | POA: Diagnosis not present

## 2021-10-28 ENCOUNTER — Other Ambulatory Visit: Payer: Self-pay

## 2021-10-28 ENCOUNTER — Other Ambulatory Visit: Payer: Medicare Other

## 2021-10-28 DIAGNOSIS — E785 Hyperlipidemia, unspecified: Secondary | ICD-10-CM

## 2021-10-28 LAB — COMPLETE METABOLIC PANEL WITH GFR
AG Ratio: 1.7 (calc) (ref 1.0–2.5)
ALT: 13 U/L (ref 6–29)
AST: 17 U/L (ref 10–35)
Albumin: 4 g/dL (ref 3.6–5.1)
Alkaline phosphatase (APISO): 54 U/L (ref 37–153)
BUN/Creatinine Ratio: 21 (calc) (ref 6–22)
BUN: 21 mg/dL (ref 7–25)
CO2: 32 mmol/L (ref 20–32)
Calcium: 9.4 mg/dL (ref 8.6–10.4)
Chloride: 103 mmol/L (ref 98–110)
Creat: 1.02 mg/dL — ABNORMAL HIGH (ref 0.60–1.00)
Globulin: 2.4 g/dL (calc) (ref 1.9–3.7)
Glucose, Bld: 90 mg/dL (ref 65–99)
Potassium: 4.4 mmol/L (ref 3.5–5.3)
Sodium: 139 mmol/L (ref 135–146)
Total Bilirubin: 0.8 mg/dL (ref 0.2–1.2)
Total Protein: 6.4 g/dL (ref 6.1–8.1)
eGFR: 56 mL/min/{1.73_m2} — ABNORMAL LOW (ref >=60)

## 2021-10-31 ENCOUNTER — Ambulatory Visit (INDEPENDENT_AMBULATORY_CARE_PROVIDER_SITE_OTHER): Payer: Medicare Other | Admitting: Nurse Practitioner

## 2021-10-31 ENCOUNTER — Other Ambulatory Visit: Payer: Self-pay

## 2021-10-31 ENCOUNTER — Encounter: Payer: Self-pay | Admitting: Nurse Practitioner

## 2021-10-31 VITALS — BP 136/78 | HR 59 | Temp 97.1°F | Ht 65.0 in | Wt 189.0 lb

## 2021-10-31 DIAGNOSIS — E785 Hyperlipidemia, unspecified: Secondary | ICD-10-CM | POA: Diagnosis not present

## 2021-10-31 DIAGNOSIS — E039 Hypothyroidism, unspecified: Secondary | ICD-10-CM

## 2021-10-31 DIAGNOSIS — E559 Vitamin D deficiency, unspecified: Secondary | ICD-10-CM

## 2021-10-31 DIAGNOSIS — J0140 Acute pansinusitis, unspecified: Secondary | ICD-10-CM | POA: Diagnosis not present

## 2021-10-31 DIAGNOSIS — F411 Generalized anxiety disorder: Secondary | ICD-10-CM

## 2021-10-31 DIAGNOSIS — F410 Panic disorder [episodic paroxysmal anxiety] without agoraphobia: Secondary | ICD-10-CM

## 2021-10-31 DIAGNOSIS — M159 Polyosteoarthritis, unspecified: Secondary | ICD-10-CM

## 2021-10-31 MED ORDER — ALPRAZOLAM 0.25 MG PO TABS: 0.2500 mg | ORAL_TABLET | Freq: Every day | ORAL | 0 refills | Status: DC | PRN

## 2021-10-31 MED ORDER — CITALOPRAM HYDROBROMIDE 20 MG PO TABS: 20.0000 mg | ORAL_TABLET | Freq: Every day | ORAL | 3 refills | Status: DC

## 2021-10-31 MED ORDER — ATORVASTATIN CALCIUM 10 MG PO TABS: ORAL_TABLET | ORAL | 3 refills | Status: DC

## 2021-10-31 MED ORDER — AMOXICILLIN-POT CLAVULANATE 875-125 MG PO TABS
1.0000 | ORAL_TABLET | Freq: Two times a day (BID) | ORAL | 0 refills | Status: DC
Start: 2021-10-31 — End: 2022-03-16

## 2021-10-31 MED ORDER — LEVOTHYROXINE SODIUM 50 MCG PO TABS: ORAL_TABLET | ORAL | 3 refills | Status: DC

## 2021-10-31 NOTE — Patient Instructions (Signed)
Florastor twice daily with antibiotic twice daily To take with full glass of water  Netipot or saline wash daily Plain nasal saline spray throughout the day as needed humidifier in the home to help with the dry air Stay well hydrated

## 2021-10-31 NOTE — Progress Notes (Signed)
Careteam: Patient Care Team: Patricia Chandler, NP as PCP - General (Geriatric Medicine)  PLACE OF SERVICE:  [place] Directive information Does Patient Have a Medical Advance Directive?: Yes, Type of Advance Directive: Living will, Does patient want to make changes to medical advance directive?: No - Patient declined  No Known Allergies  Chief Complaint  Patient presents with   Medical Management of Chronic Issues    6 month follow-up and discuss labs (copy printed). Sing treatment agreement for Xanax. Patient c/o cough and drainage x 2 months. Patient denies any vaccine updates since last visit. Discuss need for Pneumonia vaccine or post pone if patient refuses.      HPI: Patient is a 81 y.o. female for routine follow up  Weight is up, no exercise. Tends to slow down in January.  Trying to cut back on eating after 8pm.  Going back to water aerobic  She is substitute teaching and loving it.   Reports she is happy.   Had 2 skin surgeries over the summer.   Having arthritis to the top of her foot.  She has a pain in her back and pain in her foot, she was taking tylenol   Celebrating her 81th birthday in September.   Anxiety- well controlled on celexa, she has maybe taken 8 xanax since June 2021.   Reports a hacking congestion, mucous in her sinuses. Worse in the evening. Will cough it up.   Review of Systems:  Review of Systems  Constitutional:  Negative for chills, fever and weight loss.  HENT:  Positive for congestion. Negative for sinus pain, sore throat and tinnitus.   Respiratory:  Positive for cough. Negative for sputum production, shortness of breath and stridor.   Cardiovascular:  Negative for chest pain, palpitations and leg swelling.  Gastrointestinal:  Negative for abdominal pain, constipation, diarrhea and heartburn.  Genitourinary:  Negative for dysuria, frequency and urgency.  Musculoskeletal:  Positive for joint pain. Negative for back  pain, falls and myalgias.  Skin: Negative.   Neurological:  Negative for dizziness and headaches.  Psychiatric/Behavioral:  Negative for depression and memory loss. The patient does not have insomnia.    Past Medical History:  Diagnosis Date   Acute perichondritis of pinna    Allergic rhinitis due to pollen    Disorder of bone and cartilage, unspecified    Encounter for long-term (current) use of other medications    History of echocardiogram    Echo 9/18: EF 65-70   History of exercise stress test 06/2017   ETT 9/18: no ischemia; hypertensive BP response   HLD (hyperlipidemia)    Osteoarthritis    Panic disorder    Unspecified dermatitis due to sun    Unspecified hypothyroidism    Past Surgical History:  Procedure Laterality Date   BREAST BIOPSY Right 07/14/2016   BREAST BIOPSY Right 07/15/2016   cataract surgery Bilateral 10/12/2016   childbirth     x2 "blocks"   COLONOSCOPY     x2   TONSILLECTOMY  1949   removed   Social History:   reports that she has never smoked. She has never used smokeless tobacco. She reports current alcohol use of about 2.0 standard drinks per week. She reports that she does not use drugs.  Family History  Problem Relation Age of Onset   Stroke Mother    Heart failure Mother 58   Hypertension Mother    Heart attack Father 57   Hyperlipidemia Sister  Atrial fibrillation Sister    Thyroid disease Sister    Heart disease Son    Thyroid disease Son    Thyroid disease Daughter    Atrial fibrillation Brother    Breast cancer Maternal Grandmother 92   Colon cancer Neg Hx     Medications: Patient's Medications  New Prescriptions   No medications on file  Previous Medications   ALPRAZOLAM (XANAX) 0.25 MG TABLET    Take 1 tablet (0.25 mg total) by mouth 2 (two) times daily as needed (panic attacks).   ATORVASTATIN (LIPITOR) 10 MG TABLET    TAKE 1 TABLET BY MOUTH DAILY FOR CHOLESTEROL   CALCIUM PO    Take 1,000 mg by mouth daily.     CHOLECALCIFEROL (VITAMIN D3) 125 MCG (5000 UT) CAPS    Take one capsule by mouth once daily.   CITALOPRAM (CELEXA) 20 MG TABLET    TAKE 1 TABLET (20 MG TOTAL) BY MOUTH DAILY.   DICLOFENAC SODIUM (VOLTAREN) 1 % GEL    Apply 4 g topically 4 (four) times daily as needed.   FISH OIL-OMEGA-3 FATTY ACIDS 1000 MG CAPSULE    Take 1 g by mouth daily.    FLUTICASONE (FLONASE) 50 MCG/ACT NASAL SPRAY    PLACE 1 SPRAY INTO BOTH NOSTRILS DAILY.   KETOCONAZOLE (NIZORAL) 2 % CREAM    Apply 1 application topically as needed for irritation.   LEVOTHYROXINE (SYNTHROID) 50 MCG TABLET    TAKE 1 TABLET BY MOUTH ONCE DAILY 30 MINUTES PRIOR TO BREAKFAST FOR THYROID   MULTIPLE VITAMIN (MULTIVITAMIN) TABLET    Take 1 tablet by mouth daily.   NYSTATIN CREAM (MYCOSTATIN)    Apply 1 application topically 2 (two) times daily as needed.   TRIAMCINOLONE CREAM (KENALOG) 0.1 %    Apply 1 application topically daily as needed (itching).   Modified Medications   No medications on file  Discontinued Medications   MELOXICAM (MOBIC) 15 MG TABLET    Take 1 tablet (15 mg total) by mouth daily.   SIMETHICONE (GAS-X) 80 MG CHEWABLE TABLET    Chew 1 tablet (80 mg total) by mouth 4 (four) times daily as needed for flatulence.    Physical Exam:  Vitals:   10/31/21 0809  BP: 136/78  Pulse: (!) 59  Temp: (!) 97.1 F (36.2 C)  TempSrc: Temporal  SpO2: 99%  Weight: 189 lb (85.7 kg)  Height: 5' 5"  (1.651 m)   Body mass index is 31.45 kg/m. Wt Readings from Last 3 Encounters:  10/31/21 189 lb (85.7 kg)  06/19/21 181 lb 3.2 oz (82.2 kg)  04/18/21 178 lb (80.7 kg)    Physical Exam Constitutional:      General: She is not in acute distress.    Appearance: She is well-developed. She is not diaphoretic.  HENT:     Head: Normocephalic and atraumatic.     Right Ear: Tympanic membrane, ear canal and external ear normal.     Left Ear: Tympanic membrane, ear canal and external ear normal.     Nose: Congestion and rhinorrhea  present.     Mouth/Throat:     Pharynx: No oropharyngeal exudate.  Eyes:     Conjunctiva/sclera: Conjunctivae normal.     Pupils: Pupils are equal, round, and reactive to light.  Cardiovascular:     Rate and Rhythm: Normal rate and regular rhythm.     Heart sounds: Normal heart sounds.  Pulmonary:     Effort: Pulmonary effort is normal.  Breath sounds: Normal breath sounds.  Abdominal:     General: Bowel sounds are normal.     Palpations: Abdomen is soft.  Musculoskeletal:     Cervical back: Normal range of motion and neck supple.     Right lower leg: No edema.     Left lower leg: No edema.  Skin:    General: Skin is warm and dry.  Neurological:     Mental Status: She is alert and oriented to person, place, and time. Mental status is at baseline.  Psychiatric:        Mood and Affect: Mood normal.    Labs reviewed: Basic Metabolic Panel: Recent Labs    03/12/21 0847 10/28/21 0829  NA 138 139  K 4.4 4.4  CL 103 103  CO2 30 32  GLUCOSE 89 90  BUN 15 21  CREATININE 0.89 1.02*  CALCIUM 9.2 9.4  TSH 3.29  --    Liver Function Tests: Recent Labs    03/12/21 0847 10/28/21 0829  AST 24 17  ALT 21 13  BILITOT 0.8 0.8  PROT 6.3 6.4   No results for input(s): LIPASE, AMYLASE in the last 8760 hours. No results for input(s): AMMONIA in the last 8760 hours. CBC: Recent Labs    03/12/21 0847  WBC 5.4  NEUTROABS 2,673  HGB 13.7  HCT 41.6  MCV 94.8  PLT 281   Lipid Panel: Recent Labs    03/12/21 0847  CHOL 151  HDL 65  LDLCALC 65  TRIG 128  CHOLHDL 2.3   TSH: Recent Labs    03/12/21 0847  TSH 3.29   A1C: Lab Results  Component Value Date   HGBA1C 5.2 03/20/2020     Assessment/Plan 1. Generalized anxiety disorder with panic attacks Well controlled on current regimen - citalopram (CELEXA) 20 MG tablet; Take 1 tablet (20 mg total) by mouth daily.  Dispense: 90 tablet; Refill: 3 - ALPRAZolam (XANAX) 0.25 MG tablet; Take 1 tablet (0.25 mg  total) by mouth daily as needed (panic attacks).  Dispense: 20 tablet; Refill: 0  2. Acute non-recurrent pansinusitis -Netipot or saline wash daily Plain nasal saline spray throughout the day as needed humidifier in the home to help with the dry air Keep well hydrated Avoid forcefully blowing nose  (AUGMENTIN) 875-125 MG tablet; Take 1 tablet by mouth 2 (two) times daily.  Dispense: 14 tablet; Refill: 0  3. Hyperlipidemia with target LDL less than 100 - atorvastatin (LIPITOR) 10 MG tablet; TAKE 1 TABLET BY MOUTH DAILY FOR CHOLESTEROL  Dispense: 90 tablet; Refill: 3 -continue dietary modification and increase in physical activity - CMP with eGFR([facility]); Future - Lipid Panel; Future  4. Hypothyroidism, unspecified type - levothyroxine (SYNTHROID) 50 MCG tablet; TAKE 1 TABLET BY MOUTH ONCE DAILY 30 MINUTES PRIOR TO BREAKFAST FOR THYROID  Dispense: 90 tablet; Refill: 3   5. Primary osteoarthritis involving multiple joints -continue tylenol PRN, exercises encouraged for strength training  - CBC with Differential/Platelet; Future - CMP with eGFR([facility]); Future  6. Vitamin D deficiency -continue on supplement  - Vitamin D, 25-hydroxy; Future  Next appt: 6 months, labs prior  Derl Abalos K. [place], [facility] [phone]

## 2021-11-05 ENCOUNTER — Other Ambulatory Visit: Payer: Self-pay | Admitting: Nurse Practitioner

## 2021-11-05 DIAGNOSIS — Z1231 Encounter for screening mammogram for malignant neoplasm of breast: Secondary | ICD-10-CM

## 2021-11-19 ENCOUNTER — Ambulatory Visit: Payer: Medicare Other | Admitting: Podiatry

## 2021-11-19 ENCOUNTER — Encounter: Payer: Self-pay | Admitting: Podiatry

## 2021-11-19 ENCOUNTER — Other Ambulatory Visit: Payer: Self-pay

## 2021-11-19 DIAGNOSIS — M19079 Primary osteoarthritis, unspecified ankle and foot: Secondary | ICD-10-CM

## 2021-11-19 DIAGNOSIS — M7672 Peroneal tendinitis, left leg: Secondary | ICD-10-CM | POA: Diagnosis not present

## 2021-11-19 NOTE — Progress Notes (Signed)
°  Subjective:  Patient ID: Patricia Cantu, female    DOB: [birth year],   MRN: [id number]  Chief Complaint  Patient presents with   Foot Pain    I have bad and good days and I think it is related to the shoes and the right foot on the top feels like I am going to fall    81 y.o. female presents for follow-up left dorsal foot pain and now right foot weakness. Relates she has not really been stretching. Not sure if the brace and meloxicam helps but has had good days and bad days.  Denies any other pedal complaints. Denies n/v/f/c.   Past Medical History:  Diagnosis Date   Acute perichondritis of pinna    Allergic rhinitis due to pollen    Disorder of bone and cartilage, unspecified    Encounter for long-term (current) use of other medications    History of echocardiogram    Echo 9/18: EF 65-70   History of exercise stress test 06/2017   ETT 9/18: no ischemia; hypertensive BP response   HLD (hyperlipidemia)    Osteoarthritis    Panic disorder    Unspecified dermatitis due to sun    Unspecified hypothyroidism     Objective:  Physical Exam: Vascular: DP/PT pulses 2/4 bilateral. CFT <3 seconds. Normal hair growth on digits. No edema.  Skin. No lacerations or abrasions bilateral feet.  Musculoskeletal: MMT 5/5 bilateral lower extremities in DF, PF, Inversion and Eversion. Deceased ROM in DF of ankle joint. Tenderness over dorsal medial midfoot. Tender with palpation of base of fifth metatarsal and peroneal tendon insertion but improved. . Tender with eversion of foot. No tenderness of the right side.  Neurological: Sensation intact to light touch.   Assessment:   1. Peroneal tendonitis, left   2. Arthritis of midfoot       Plan:  Patient was evaluated and treated and all questions answered. X-rays reviewed and discussed with patient. Discussed peroneal tendinitis and midfoot arthritis and treatment options at length with patient Discussed stretching exercises and provided  handout Discussed use of voltaren gel.  Continue tri lock and try on the right side.  Patient to return as needed.    Lorenda Peck, DPM

## 2021-11-19 NOTE — Patient Instructions (Signed)
Peroneal Tendinopathy Rehab Ask your health care provider which exercises are safe for you. Do exercises exactly as told by your health care provider and adjust them as directed. It is normal to feel mild stretching, pulling, tightness, or discomfort as you do these exercises. Stop right away if you feel sudden pain or your pain gets worse. Do not begin these exercises until told by your health care provider. Stretching and range-of-motion exercises These exercises warm up your muscles and joints and improve the movement and flexibility of your ankle. These exercises also help to relieve pain and stiffness. Gastroc and soleus stretch, standing This is an exercise in which you stand on a step and use your body weight to stretch your calf muscles. To do this exercise: Stand on the edge of a step on the ball of your left / right foot. The ball of your foot is on the walking surface, right under your toes. Keep your other foot firmly on the same step. Hold on to the wall, a railing, or a chair for balance. Slowly lift your other foot, allowing your body weight to press your left / right heel down over the edge of the step. You should feel a stretch in your left / right calf (gastrocnemius and soleus). Hold this position for __________ seconds. Return both feet to the step. Repeat this exercise with a slight bend in your left / right knee. Repeat __________ times with your left / right knee straight and __________ times with your left / right knee bent. Complete this exercise __________ times a day. Strengthening exercises These exercises build strength and endurance in your foot and ankle. Endurance is the ability to use your muscles for a long time, even after they get tired. Ankle dorsiflexion with band  Secure a rubber exercise band or tube to an object, such as a table leg, that will not move when the band is pulled. Secure the other end of the band around your left / right foot. Sit on the  floor, facing the object with your left / right leg extended. The band or tube should be slightly tense when your foot is relaxed. Slowly flex your left / right ankle and toes to bring your foot toward you (dorsiflexion). Hold this position for __________ seconds. Let the band or tube slowly pull your foot back to the starting position. Repeat __________ times. Complete this exercise __________ times a day. Ankle eversion Sit on the floor with your legs straight out in front of you. Loop a rubber exercise band or tube around the ball of your left / right foot. The ball of your foot is on the walking surface, right under your toes. Hold the ends of the band in your hands, or secure the band to a stable object. The band or tube should be slightly tense when your foot is relaxed. Slowly push your foot outward, away from your other leg (eversion). Hold this position for __________ seconds. Slowly return your foot to the starting position. Repeat __________ times. Complete this exercise __________ times a day. Plantar flexion, standing This exercise is sometimes called standing heel raise. Stand with your feet shoulder-width apart. Place your hands on a wall or table to steady yourself as needed, but try not to use it for support. Keep your weight spread evenly over the width of your feet while you slowly rise up on your toes (plantar flexion). If told by your health care provider: Shift your weight toward your left / right  leg until you feel challenged. Stand on your left / right leg only. Hold this position for __________ seconds. Repeat __________ times. Complete this exercise __________ times a day. Single leg stand Without shoes, stand near a railing or in a doorway. You may hold on to the railing or door frame as needed. Stand on your left / right foot. Keep your big toe down on the floor and try to keep your arch lifted. Do not roll to the outside of your foot. If this exercise is too  easy, you can try it with your eyes closed or while standing on a pillow. Hold this position for __________ seconds. Repeat __________ times. Complete this exercise __________ times a day. This information is not intended to replace advice given to you by your health care provider. Make sure you discuss any questions you have with your health care provider. Document Revised: 01/10/2019 Document Reviewed: 01/10/2019 Elsevier Patient Education  Patricia Cantu.

## 2021-11-22 ENCOUNTER — Encounter: Payer: Self-pay | Admitting: Nurse Practitioner

## 2021-12-31 DIAGNOSIS — L570 Actinic keratosis: Secondary | ICD-10-CM | POA: Diagnosis not present

## 2022-01-05 ENCOUNTER — Ambulatory Visit: Payer: Medicare Other

## 2022-01-07 ENCOUNTER — Ambulatory Visit
Admission: RE | Admit: 2022-01-07 | Discharge: 2022-01-07 | Disposition: A | Discharge status: Home / Self Care | Payer: Medicare Other | Source: Ambulatory Visit | Attending: Nurse Practitioner | Admitting: Nurse Practitioner

## 2022-01-07 DIAGNOSIS — Z1231 Encounter for screening mammogram for malignant neoplasm of breast: Secondary | ICD-10-CM | POA: Diagnosis not present

## 2022-01-09 DIAGNOSIS — L570 Actinic keratosis: Secondary | ICD-10-CM | POA: Diagnosis not present

## 2022-03-13 NOTE — Patient Instructions (Signed)
Please contact your local pharmacy, previous provider, or insurance carrier for vaccine/immunization records. Ensure that any procedures done outside of [facility] are faxed to [place] [phone] or you can sign release of records form at the front desk to keep your medical record updated.    Heating pad to neck 3 times daily ~20 mins for 5-7 days.  Make sure your pillow is supportive.  Prednisone dose pack.    To make sure to changes pads frequently

## 2022-03-16 ENCOUNTER — Encounter: Payer: Self-pay | Admitting: Nurse Practitioner

## 2022-03-16 ENCOUNTER — Ambulatory Visit (INDEPENDENT_AMBULATORY_CARE_PROVIDER_SITE_OTHER): Payer: Medicare Other | Admitting: Nurse Practitioner

## 2022-03-16 VITALS — BP 120/82 | HR 72 | Temp 96.9°F | Resp 16 | Ht 65.0 in | Wt 190.2 lb

## 2022-03-16 DIAGNOSIS — M542 Cervicalgia: Secondary | ICD-10-CM

## 2022-03-16 DIAGNOSIS — B3731 Acute candidiasis of vulva and vagina: Secondary | ICD-10-CM

## 2022-03-16 MED ORDER — NYSTATIN 100000 UNIT/GM EX OINT: 1.0000 "application " | TOPICAL_OINTMENT | Freq: Two times a day (BID) | CUTANEOUS | 0 refills | Status: DC

## 2022-03-16 MED ORDER — PREDNISONE 10 MG (21) PO TBPK: ORAL_TABLET | ORAL | 0 refills | Status: DC

## 2022-03-16 MED ORDER — FLUTICASONE PROPIONATE 50 MCG/ACT NA SUSP: 1.0000 | Freq: Every day | NASAL | 3 refills | Status: DC

## 2022-03-16 MED ORDER — MICONAZOLE 3 200 MG VA SUPP: 200.0000 mg | Freq: Every day | VAGINAL | 0 refills | Status: DC

## 2022-03-16 NOTE — Progress Notes (Signed)
Careteam: Patient Care Team: [name], NP as PCP - General (Geriatric Medicine)  PLACE OF SERVICE:  [place]  Advanced Directive information    Allergies  Allergen Reactions   Shellfish Allergy Diarrhea and Nausea And Vomiting    Chief Complaint  Patient presents with   Acute Visit    Patient complains of neck discomfort X 6 weeks.    Concern     Patient states she has sore in pelvic area.      HPI: Patient is a 81 y.o. female due to neck discomfort over the last 2-3 months   She has to find a comfortable way to sleep.  Hurts with certain movement when moving neck from side to side.  No numbness or tingling down arm or decrease in ROM.  5/10 pain down ear into neck.   Also noted soreness in vaginal area. Will get worse and then better but reoccurs.  No drainage/discharge.  She does have urinary incontinence and has wet pads  Review of Systems:  Review of Systems  Constitutional:  Negative for chills, fever and malaise/fatigue.  Genitourinary:        Ongoing discomfort in vaginal area, no discharge noted  Musculoskeletal:  Positive for myalgias and neck pain.  Neurological:  Negative for dizziness, tingling, tremors and headaches.    Past Medical History:  Diagnosis Date   Acute perichondritis of pinna    Allergic rhinitis due to pollen    Disorder of bone and cartilage, unspecified    Encounter for long-term (current) use of other medications    History of echocardiogram    Echo 9/18: EF 65-70   History of exercise stress test 06/2017   ETT 9/18: no ischemia; hypertensive BP response   HLD (hyperlipidemia)    Osteoarthritis    Panic disorder    Unspecified dermatitis due to sun    Unspecified hypothyroidism    Past Surgical History:  Procedure Laterality Date   BREAST BIOPSY Right 07/14/2016   BREAST BIOPSY Right 07/15/2016   cataract surgery Bilateral 10/12/2016   childbirth     x2 "blocks"   COLONOSCOPY     x2   TONSILLECTOMY   1949   removed   Social History:   reports that she has never smoked. She has never used smokeless tobacco. She reports current alcohol use of about 2.0 standard drinks of alcohol per week. She reports that she does not use drugs.  Family History  Problem Relation Age of Onset   Stroke Mother    Heart failure Mother 74   Hypertension Mother    Heart attack Father 41   Hyperlipidemia Sister    Atrial fibrillation Sister    Thyroid disease Sister    Heart disease Son    Thyroid disease Son    Thyroid disease Daughter    Atrial fibrillation Brother    Breast cancer Maternal Grandmother 92   Colon cancer Neg Hx     Medications: Patient's Medications  New Prescriptions   No medications on file  Previous Medications   ALPRAZOLAM (XANAX) 0.25 MG TABLET    Take 1 tablet (0.25 mg total) by mouth daily as needed (panic attacks).   ATORVASTATIN (LIPITOR) 10 MG TABLET    TAKE 1 TABLET BY MOUTH DAILY FOR CHOLESTEROL   CALCIUM PO    Take 1,000 mg by mouth daily.    CHOLECALCIFEROL (VITAMIN D3) 125 MCG (5000 UT) CAPS    Take one capsule by mouth once daily.  CITALOPRAM (CELEXA) 20 MG TABLET    Take 1 tablet (20 mg total) by mouth daily.   DICLOFENAC SODIUM (VOLTAREN) 1 % GEL    Apply 4 g topically 4 (four) times daily as needed.   FISH OIL-OMEGA-3 FATTY ACIDS 1000 MG CAPSULE    Take 1 g by mouth daily.    FLUTICASONE (FLONASE) 50 MCG/ACT NASAL SPRAY    PLACE 1 SPRAY INTO BOTH NOSTRILS DAILY.   LEVOTHYROXINE (SYNTHROID) 50 MCG TABLET    TAKE 1 TABLET BY MOUTH ONCE DAILY 30 MINUTES PRIOR TO BREAKFAST FOR THYROID   MULTIPLE VITAMIN (MULTIVITAMIN) TABLET    Take 1 tablet by mouth daily.   NYSTATIN CREAM (MYCOSTATIN)    Apply 1 application topically 2 (two) times daily as needed.   TRIAMCINOLONE CREAM (KENALOG) 0.1 %    Apply 1 application topically daily as needed (itching).   Modified Medications   No medications on file  Discontinued Medications   AMOXICILLIN-CLAVULANATE (AUGMENTIN)  875-125 MG TABLET    Take 1 tablet by mouth 2 (two) times daily.   KETOCONAZOLE (NIZORAL) 2 % CREAM    Apply 1 application topically as needed for irritation.    Physical Exam:  Vitals:   03/16/22 0804  BP: 120/82  Pulse: 72  Resp: 16  Temp: (!) 96.9 F (36.1 C)  SpO2: 94%  Weight: 190 lb 3.2 oz (86.3 kg)  Height: '5\' 5"'$  (1.651 m)   Body mass index is 31.65 kg/m. Wt Readings from Last 3 Encounters:  03/16/22 190 lb 3.2 oz (86.3 kg)  10/31/21 189 lb (85.7 kg)  06/19/21 181 lb 3.2 oz (82.2 kg)    Physical Exam Constitutional:      Appearance: Normal appearance.  Pulmonary:     Effort: Pulmonary effort is normal.  Genitourinary:    Comments: Redness noted to right side of labia, no ulceration or drainage.  Musculoskeletal:     Cervical back: Normal range of motion. Pain with movement and muscular tenderness present.  Skin:    General: Skin is warm and dry.  Neurological:     Mental Status: She is alert. Mental status is at baseline.  Psychiatric:        Mood and Affect: Mood normal.     Labs reviewed: Basic Metabolic Panel: Recent Labs    10/28/21 0829  NA 139  K 4.4  CL 103  CO2 32  GLUCOSE 90  BUN 21  CREATININE 1.02*  CALCIUM 9.4   Liver Function Tests: Recent Labs    10/28/21 0829  AST 17  ALT 13  BILITOT 0.8  PROT 6.4   No results for input(s): "LIPASE", "AMYLASE" in the last 8760 hours. No results for input(s): "AMMONIA" in the last 8760 hours. CBC: No results for input(s): "WBC", "NEUTROABS", "HGB", "HCT", "MCV", "PLT" in the last 8760 hours. Lipid Panel: No results for input(s): "CHOL", "HDL", "LDLCALC", "TRIG", "CHOLHDL", "LDLDIRECT" in the last 8760 hours. TSH: No results for input(s): "TSH" in the last 8760 hours. A1C: Lab Results  Component Value Date   HGBA1C 5.2 03/20/2020     Assessment/Plan 1. Neck pain -supportive pillow -heating pad TID ~20 mins - predniSONE (STERAPRED UNI-PAK 21 TAB) 10 MG (21) TBPK tablet; Use as  directed  Dispense: 21 tablet; Refill: 0  2. Vaginal yeast infection - miconazole (MICONAZOLE 3) 200 MG vaginal suppository; Place 1 suppository (200 mg total) vaginally at bedtime.  Dispense: 3 suppository; Refill: 0 - to change pad frequently   To follow up  in July with labs prior to appt  Zahriyah Joo K. [place], [facility] [phone]

## 2022-04-22 ENCOUNTER — Other Ambulatory Visit: Payer: Medicare Other

## 2022-04-22 DIAGNOSIS — E039 Hypothyroidism, unspecified: Secondary | ICD-10-CM | POA: Diagnosis not present

## 2022-04-22 DIAGNOSIS — M159 Polyosteoarthritis, unspecified: Secondary | ICD-10-CM

## 2022-04-22 DIAGNOSIS — E785 Hyperlipidemia, unspecified: Secondary | ICD-10-CM | POA: Diagnosis not present

## 2022-04-22 DIAGNOSIS — E559 Vitamin D deficiency, unspecified: Secondary | ICD-10-CM | POA: Diagnosis not present

## 2022-04-23 ENCOUNTER — Other Ambulatory Visit: Payer: Medicare Other | Admitting: Nurse Practitioner

## 2022-04-27 ENCOUNTER — Ambulatory Visit: Payer: Medicare Other | Admitting: Nurse Practitioner

## 2022-04-27 ENCOUNTER — Encounter: Payer: Self-pay | Admitting: Nurse Practitioner

## 2022-04-27 VITALS — BP 132/80 | HR 67 | Temp 97.9°F | Ht 65.0 in | Wt 192.2 lb

## 2022-04-27 DIAGNOSIS — K219 Gastro-esophageal reflux disease without esophagitis: Secondary | ICD-10-CM | POA: Diagnosis not present

## 2022-04-27 DIAGNOSIS — E039 Hypothyroidism, unspecified: Secondary | ICD-10-CM

## 2022-04-27 DIAGNOSIS — M542 Cervicalgia: Secondary | ICD-10-CM

## 2022-04-27 DIAGNOSIS — E785 Hyperlipidemia, unspecified: Secondary | ICD-10-CM

## 2022-04-27 MED ORDER — FAMOTIDINE 10 MG PO TABS: 10.0000 mg | ORAL_TABLET | Freq: Two times a day (BID) | ORAL | 1 refills | Status: DC | PRN

## 2022-04-27 NOTE — Patient Instructions (Signed)
Famotidine 10 mg by mouth twice daily as needed

## 2022-04-27 NOTE — Progress Notes (Unsigned)
Careteam: Patient Care Team: [name], NP as PCP - General (Geriatric Medicine)  PLACE OF SERVICE:  [place] Directive information Does Patient Have a Medical Advance Directive?: Yes, Type of Advance Directive: [place];Living will, Does patient want to make changes to medical advance directive?: No - Patient declined  Allergies  Allergen Reactions   Shellfish Allergy Diarrhea and Nausea And Vomiting    Chief Complaint  Patient presents with   Medical Management of Chronic Issues    Routine visit and discuss recent labs.  Discuss need for additional covid boosters or post pone if patient refuses. NCIR verified. Patient denies receiving any vaccines since last visit.      HPI: Patient is a 81 y.o. female for routine follow up.   Continue to have neck pain. Reports she is having trouble with ROM. Tenderness along the left side.   Exercise routine has not been good. Needs to get back into weight bearing exercising.  Does swimming.   Continues on lipitor   Mood is stable on celexa with lorazepam PRN  Will occasionally get worsening GERD, worse with tomato base foods.  Review of Systems:  Review of Systems  Constitutional:  Negative for chills, fever and weight loss.  HENT:  Negative for tinnitus.   Respiratory:  Negative for cough, sputum production and shortness of breath.   Cardiovascular:  Negative for chest pain, palpitations and leg swelling.  Gastrointestinal:  Negative for abdominal pain, constipation, diarrhea and heartburn.  Genitourinary:  Negative for dysuria, frequency and urgency.  Musculoskeletal:  Positive for neck pain. Negative for back pain, falls, joint pain and myalgias.  Skin: Negative.   Neurological:  Negative for dizziness and headaches.  Psychiatric/Behavioral:  Negative for depression and memory loss. The patient does not have insomnia.     Past Medical History:  Diagnosis Date   Acute  perichondritis of pinna    Allergic rhinitis due to pollen    Disorder of bone and cartilage, unspecified    Encounter for long-term (current) use of other medications    History of echocardiogram    Echo 9/18: EF 65-70   History of exercise stress test 06/2017   ETT 9/18: no ischemia; hypertensive BP response   HLD (hyperlipidemia)    Osteoarthritis    Panic disorder    Unspecified dermatitis due to sun    Unspecified hypothyroidism    Past Surgical History:  Procedure Laterality Date   BREAST BIOPSY Right 07/14/2016   BREAST BIOPSY Right 07/15/2016   cataract surgery Bilateral 10/12/2016   childbirth     x2 "blocks"   COLONOSCOPY     x2   TONSILLECTOMY  1949   removed   Social History:   reports that she has never smoked. She has never used smokeless tobacco. She reports current alcohol use of about 2.0 standard drinks of alcohol per week. She reports that she does not use drugs.  Family History  Problem Relation Age of Onset   Stroke Mother    Heart failure Mother 44   Hypertension Mother    Heart attack Father 27   Hyperlipidemia Sister    Atrial fibrillation Sister    Thyroid disease Sister    Heart disease Son    Thyroid disease Son    Thyroid disease Daughter    Atrial fibrillation Brother    Breast cancer Maternal Grandmother 92   Colon cancer Neg Hx     Medications: Patient's Medications  New  Prescriptions   No medications on file  Previous Medications   ALPRAZOLAM (XANAX) 0.25 MG TABLET    Take 1 tablet (0.25 mg total) by mouth daily as needed (panic attacks).   ATORVASTATIN (LIPITOR) 10 MG TABLET    TAKE 1 TABLET BY MOUTH DAILY FOR CHOLESTEROL   CALCIUM PO    Take 1,000 mg by mouth daily.    CHOLECALCIFEROL (VITAMIN D3) 125 MCG (5000 UT) CAPS    Take one capsule by mouth once daily.   CITALOPRAM (CELEXA) 20 MG TABLET    Take 1 tablet (20 mg total) by mouth daily.   DICLOFENAC SODIUM (VOLTAREN) 1 % GEL    Apply 4 g topically 4 (four) times daily as  needed.   FISH OIL-OMEGA-3 FATTY ACIDS 1000 MG CAPSULE    Take 1 g by mouth daily.    FLUTICASONE (FLONASE) 50 MCG/ACT NASAL SPRAY    Place 1 spray into both nostrils daily.   LEVOTHYROXINE (SYNTHROID) 50 MCG TABLET    TAKE 1 TABLET BY MOUTH ONCE DAILY 30 MINUTES PRIOR TO BREAKFAST FOR THYROID   MULTIPLE VITAMIN (MULTIVITAMIN) TABLET    Take 1 tablet by mouth daily.   NYSTATIN CREAM (MYCOSTATIN)    Apply 1 application topically 2 (two) times daily as needed.   TRIAMCINOLONE CREAM (KENALOG) 0.1 %    Apply 1 application topically daily as needed (itching).   Modified Medications   No medications on file  Discontinued Medications   MICONAZOLE (MICONAZOLE 3) 200 MG VAGINAL SUPPOSITORY    Place 1 suppository (200 mg total) vaginally at bedtime.   PREDNISONE (STERAPRED UNI-PAK 21 TAB) 10 MG (21) TBPK TABLET    Use as directed    Physical Exam:  Vitals:   04/27/22 0842  BP: 132/80  Pulse: 67  Temp: 97.9 F (36.6 C)  TempSrc: Temporal  SpO2: 98%  Weight: 192 lb 3.2 oz (87.2 kg)  Height: 5' 5"  (1.651 m)   Body mass index is 31.98 kg/m. Wt Readings from Last 3 Encounters:  04/27/22 192 lb 3.2 oz (87.2 kg)  03/16/22 190 lb 3.2 oz (86.3 kg)  10/31/21 189 lb (85.7 kg)    Physical Exam Constitutional:      General: She is not in acute distress.    Appearance: She is well-developed. She is not diaphoretic.  HENT:     Head: Normocephalic and atraumatic.     Mouth/Throat:     Pharynx: No oropharyngeal exudate.  Eyes:     Conjunctiva/sclera: Conjunctivae normal.     Pupils: Pupils are equal, round, and reactive to light.  Neck:   Cardiovascular:     Rate and Rhythm: Normal rate and regular rhythm.     Heart sounds: Normal heart sounds.  Pulmonary:     Effort: Pulmonary effort is normal.     Breath sounds: Normal breath sounds.  Abdominal:     General: Bowel sounds are normal.     Palpations: Abdomen is soft.  Musculoskeletal:     Cervical back: Neck supple. Pain with movement  and muscular tenderness present.     Right lower leg: No edema.     Left lower leg: No edema.  Skin:    General: Skin is warm and dry.  Neurological:     Mental Status: She is alert.  Psychiatric:        Mood and Affect: Mood normal.     Labs reviewed: Basic Metabolic Panel: Recent Labs    10/28/21 0829 04/22/22 0858  NA  139 139  K 4.4 4.6  CL 103 105  CO2 32 22  GLUCOSE 90 91  BUN 21 22  CREATININE 1.02* 0.99  CALCIUM 9.4 8.9   Liver Function Tests: Recent Labs    10/28/21 0829 04/22/22 0858  AST 17 19  ALT 13 14  BILITOT 0.8 0.7  PROT 6.4 6.4   No results for input(s): "LIPASE", "AMYLASE" in the last 8760 hours. No results for input(s): "AMMONIA" in the last 8760 hours. CBC: Recent Labs    04/22/22 0858  WBC 5.8  NEUTROABS 2,987  HGB 14.2  HCT 41.9  MCV 94.2  PLT 284   Lipid Panel: Recent Labs    04/22/22 0858  CHOL 180  HDL 70  LDLCALC 87  TRIG 125  CHOLHDL 2.6   TSH: No results for input(s): "TSH" in the last 8760 hours. A1C: Lab Results  Component Value Date   HGBA1C 5.2 03/20/2020     Assessment/Plan 1. Neck pain Ongoing. Prednisone without much effect. Also having decrease in ROM with tenderness on movement  - Ambulatory referral to Physical Therapy for further evaluation and treatment.  - CBC with Differential/Platelet; Future  2. Gastroesophageal reflux disease without esophagitis -dietary modifications encouraged.  - famotidine (PEPCID) 10 MG tablet; Take 1 tablet (10 mg total) by mouth 2 (two) times daily as needed for heartburn or indigestion.  Dispense: 60 tablet; Refill: 1 - CBC with Differential/Platelet; Future  3. Hyperlipidemia with target LDL less than 100 -stable, continues on lipitor with dietary modifications.  - Lipid panel; Future - CMP with eGFR([facility]); Future - CBC with Differential/Platelet; Future  4. Hypothyroidism, unspecified type -will follow up TSH -continues on synthroid 50 mcg  Return in about  6 months (around 10/28/2022) for routine follow up labs prior to visit . [facility]. [place], [facility] [phone]

## 2022-04-28 LAB — CBC WITH DIFFERENTIAL/PLATELET
Absolute Monocytes: 615 cells/uL (ref 200–950)
Basophils Absolute: 70 cells/uL (ref 0–200)
Basophils Relative: 1.2 %
Eosinophils Absolute: 191 cells/uL (ref 15–500)
Eosinophils Relative: 3.3 %
HCT: 41.9 % (ref 35.0–45.0)
Hemoglobin: 14.2 g/dL (ref 11.7–15.5)
Lymphs Abs: 1937 cells/uL (ref 850–3900)
MCH: 31.9 pg (ref 27.0–33.0)
MCHC: 33.9 g/dL (ref 32.0–36.0)
MCV: 94.2 fL (ref 80.0–100.0)
MPV: 10.5 fL (ref 7.5–12.5)
Monocytes Relative: 10.6 %
Neutro Abs: 2987 cells/uL (ref 1500–7800)
Neutrophils Relative %: 51.5 %
Platelets: 284 10*3/uL (ref 140–400)
RBC: 4.45 10*6/uL (ref 3.80–5.10)
RDW: 13.1 % (ref 11.0–15.0)
Total Lymphocyte: 33.4 %
WBC: 5.8 10*3/uL (ref 3.8–10.8)

## 2022-04-28 LAB — LIPID PANEL
Cholesterol: 180 mg/dL (ref <200)
HDL: 70 mg/dL (ref >=50)
LDL Cholesterol (Calc): 87 mg/dL (calc)
Non-HDL Cholesterol (Calc): 110 mg/dL (calc) (ref <130)
Total CHOL/HDL Ratio: 2.6 (calc) (ref <5.0)
Triglycerides: 125 mg/dL (ref <150)

## 2022-04-28 LAB — COMPLETE METABOLIC PANEL WITH GFR
AG Ratio: 1.7 (calc) (ref 1.0–2.5)
ALT: 14 U/L (ref 6–29)
AST: 19 U/L (ref 10–35)
Albumin: 4 g/dL (ref 3.6–5.1)
Alkaline phosphatase (APISO): 51 U/L (ref 37–153)
BUN: 22 mg/dL (ref 7–25)
CO2: 22 mmol/L (ref 20–32)
Calcium: 8.9 mg/dL (ref 8.6–10.4)
Chloride: 105 mmol/L (ref 98–110)
Creat: 0.99 mg/dL (ref 0.60–1.00)
Globulin: 2.4 g/dL (calc) (ref 1.9–3.7)
Glucose, Bld: 91 mg/dL (ref 65–99)
Potassium: 4.6 mmol/L (ref 3.5–5.3)
Sodium: 139 mmol/L (ref 135–146)
Total Bilirubin: 0.7 mg/dL (ref 0.2–1.2)
Total Protein: 6.4 g/dL (ref 6.1–8.1)
eGFR: 58 mL/min/{1.73_m2} — ABNORMAL LOW (ref >=60)

## 2022-04-28 LAB — TEST AUTHORIZATION

## 2022-04-28 LAB — VITAMIN D 25 HYDROXY (VIT D DEFICIENCY, FRACTURES): Vit D, 25-Hydroxy: 41 ng/mL (ref 30–100)

## 2022-04-28 LAB — TSH: TSH: 1.59 mIU/L (ref 0.40–4.50)

## 2022-05-07 NOTE — Telephone Encounter (Signed)
This encounter was created in error - please disregard.

## 2022-05-13 ENCOUNTER — Telehealth: Payer: Self-pay

## 2022-05-13 DIAGNOSIS — L821 Other seborrheic keratosis: Secondary | ICD-10-CM | POA: Diagnosis not present

## 2022-05-13 DIAGNOSIS — L570 Actinic keratosis: Secondary | ICD-10-CM | POA: Diagnosis not present

## 2022-05-13 NOTE — Telephone Encounter (Signed)
Patient called to inquire about status of referral. It appears that it was sent to a department that does not do PT. See referral dated 04/27/22   Patient asked that referral be reworked for [place] [phone]  Teams message was sent to the referral team

## 2022-06-01 ENCOUNTER — Ambulatory Visit: Payer: Medicare Other | Attending: Nurse Practitioner | Admitting: Physical Therapy

## 2022-06-01 ENCOUNTER — Encounter: Payer: Self-pay | Admitting: Physical Therapy

## 2022-06-01 ENCOUNTER — Other Ambulatory Visit: Payer: Self-pay

## 2022-06-01 DIAGNOSIS — M542 Cervicalgia: Secondary | ICD-10-CM | POA: Insufficient documentation

## 2022-06-01 DIAGNOSIS — M6281 Muscle weakness (generalized): Secondary | ICD-10-CM | POA: Diagnosis not present

## 2022-06-01 DIAGNOSIS — R293 Abnormal posture: Secondary | ICD-10-CM | POA: Insufficient documentation

## 2022-06-01 NOTE — Patient Instructions (Signed)
Access Code: [email or web address] URL: [email or web address] Date: 06/01/2022 Prepared by: Hilda Blades  Exercises - Supine Cervical Retraction with Towel  - 2 x daily - 10-15 reps - 5 seconds hold - Seated Scapular Retraction  - 2 x daily - 10-15 reps - 5 seconds hold - Seated Cervical Sidebending Stretch  - 2 x daily - 3 reps - 15 seconds hold

## 2022-06-01 NOTE — Therapy (Signed)
OUTPATIENT PHYSICAL THERAPY EVALUATION   Patient Name: Patricia Cantu MRN: [id number] DOB:November 06, 1940, 81 y.o., female Today's Date: 06/01/2022   PT End of Session - 06/01/22 0945     Visit Number 1    Number of Visits 9    Date for PT Re-Evaluation 07/27/22    Authorization Type [employer] MEDICARE    Authorization Time Period FOTO by 6th    Progress Note Due on Visit 10    PT Start Time 0950    PT Stop Time 1035    PT Time Calculation (min) 45 min    Activity Tolerance Patient tolerated treatment well    Behavior During Therapy [facility] for tasks assessed/performed             Past Medical History:  Diagnosis Date   Acute perichondritis of pinna    Allergic rhinitis due to pollen    Disorder of bone and cartilage, unspecified    Encounter for long-term (current) use of other medications    History of echocardiogram    Echo 9/18: EF 65-70   History of exercise stress test 06/2017   ETT 9/18: no ischemia; hypertensive BP response   HLD (hyperlipidemia)    Osteoarthritis    Panic disorder    Unspecified dermatitis due to sun    Unspecified hypothyroidism    Past Surgical History:  Procedure Laterality Date   BREAST BIOPSY Right 07/14/2016   BREAST BIOPSY Right 07/15/2016   cataract surgery Bilateral 10/12/2016   childbirth     x2 "blocks"   COLONOSCOPY     x2   TONSILLECTOMY  1949   removed   Patient Active Problem List   Diagnosis Date Noted   Chronic pain of both knees 08/25/2018   Generalized anxiety disorder with panic attacks 01/27/2018   Overweight (BMI 25.0-29.9) 01/27/2018   Family history of MI (myocardial infarction) 04/12/2017   Insomnia 11/23/2013   Premature atrial complexes 02/20/2013   Osteoarthritis of both knees 01/02/2013   Hyperlipidemia with target LDL less than 100 01/02/2013   Vitamin D deficiency 01/02/2013   Hypothyroidism 01/02/2013   Depressive disorder, not elsewhere classified 01/02/2013    PCP: [name], NP  REFERRING  PROVIDER: [name], NP  REFERRING DIAG: Neck pain  THERAPY DIAG:  Cervicalgia  Muscle weakness (generalized)  Abnormal posture  Rationale for Evaluation and Treatment Rehabilitation  ONSET DATE: July 2023   SUBJECTIVE:    SUBJECTIVE STATEMENT: Patient reports neck pain that started with some swimming and water aerobics, in beginning of July 2023. The she did a lot of driving and this aggravated her neck, and she has had trouble finding a comfortable position to sleep. She feels she has lost some range of motion turning her neck and the pain runs from the base of the skull to the top of the shoulders on both sides. Patient also notes her head will feel heavy and that she has trouble swinging a club while golfing. Patient denies headaches, no numbness or tingling.  PERTINENT HISTORY:  Depression/anxiety  PAIN:  Are you having pain? Yes:  NPRS scale: 5/10 (7/10 with driving) Pain location: Neck, bilateral, base of skull to top of shoulders Pain description: Tension, aching, sharp Aggravating factors: Turning head, driving, swinging golf club Relieving factors: Medication, heat  PRECAUTIONS: None  WEIGHT BEARING RESTRICTIONS No  FALLS:  Has patient fallen in last 6 months? No  LIVING ENVIRONMENT: Lives with: lives with their family  OCCUPATION: Retired  PLOF: Independent  PATIENT  GOALS: Improve neck motion and pain for driving and golf   OBJECTIVE:  PATIENT SURVEYS:  FOTO 44% functional status  COGNITION: Overall cognitive status: Within functional limits for tasks assessed  SENSATION: WFL  POSTURE:  Forward head posture, rounded shoulders  PALPATION: Tender to palpation bilateral upper trap, suboccipitals   CERVICAL ROM:   Active ROM A/PROM (deg) eval  Flexion 55  Extension 45  Right lateral flexion 10  Left lateral flexion 10  Right rotation 45  Left rotation 45  Patient reports left rotation "harder" than right  UPPER EXTREMITY  ROM:   UE AROM grossly WFL  UPPER EXTREMITY MMT:  MMT Right eval Left eval  Shoulder flexion 5 5  Shoulder extension 4 4  Shoulder abduction 5 5  Shoulder internal rotation 5 5  Shoulder external rotation 5 4  Middle trapezius 4- 4-  Lower trapezius 4- 4-   CERVICAL SPECIAL TESTS:  Radicular testing negative  FUNCTIONAL TESTS:  DNF endurance 6 seconds   TODAY'S TREATMENT:  [facility] Adult PT Treatment:                                                DATE: 06/01/2022 Therapeutic Exercise: Supine chin tuck 10 x 5 sec Seated shoulder blade squeezes 10 x 5 sec Seated upper trap stretch 2 x 15 sec each Manual: Skilled palpation and monitoring of muscle tension while performing TPDN  Suboccipital release with gentle manual traction x 3 Trigger Point Dry Needling Treatment: Pre-treatment instruction: Patient instructed on dry needling rationale, procedures, and possible side effects including pain during treatment (achy,cramping feeling), bruising, drop of blood, lightheadedness, nausea, sweating. Patient Consent Given: Yes Education handout provided: No Muscles treated: bilateral upper trap  Needle size and number: .30x61m x 3 Electrical stimulation performed: No Parameters: N/A Treatment response/outcome: Twitch response elicited and Palpable decrease in muscle tension Post-treatment instructions: Patient instructed to expect possible mild to moderate muscle soreness later today and/or tomorrow. Patient instructed in methods to reduce muscle soreness and to continue prescribed HEP. If patient was dry needled over the lung field, patient was instructed on signs and symptoms of pneumothorax and, however unlikely, to see immediate medical attention should they occur. Patient was also educated on signs and symptoms of infection and to seek medical attention should they occur. Patient verbalized understanding of these instructions and education.   PATIENT EDUCATION:  Education details:  Exam findings, POC, HEP, TPDN Person educated: Patient Education method: Explanation, Demonstration, Tactile cues, Verbal cues, and Handouts Education comprehension: verbalized understanding, returned demonstration, verbal cues required, tactile cues required, and needs further education  HOME EXERCISE PROGRAM: Access Code: [email or web address]  ASSESSMENT: CLINICAL IMPRESSION: Patient is a 81y.o. female who was seen today for physical therapy evaluation and treatment for chronic neck pain. Her symptoms seem muscular in nature with a postural component. Perform TPDN this visit with good therapeutic response and provided her with exercises to initiate postural control and neck stretching.   OBJECTIVE IMPAIRMENTS decreased ROM, decreased strength, impaired flexibility, postural dysfunction, and pain.   ACTIVITY LIMITATIONS lifting, sitting, standing, sleeping, and hygiene/grooming  PARTICIPATION LIMITATIONS: meal prep, cleaning, driving, and golf  PERSONAL FACTORS Age, Fitness, and Time since onset of injury/illness/exacerbation are also affecting patient's functional outcome.   REHAB POTENTIAL: Good  CLINICAL DECISION MAKING: Stable/uncomplicated  EVALUATION COMPLEXITY: Low   GOALS: Goals reviewed  with patient? Yes  SHORT TERM GOALS: Target date: 06/29/2022   Patient will be I with initial HEP in order to progress with therapy. Baseline: HEP provided at eval Goal status: INITIAL  2.  PT will review FOTO with patient by 3rd visit in order to understand expected progress and outcome with therapy. Baseline: FOTO assessed at eval Goal status: INITIAL  3.  Patient will report pain level </= 3/10 with driving or household tasks in order to reduce functional limitations. Baseline: 7/10 pain Goal status: INITIAL  LONG TERM GOALS: Target date: 07/27/2022  Patient will be I with final HEP to maintain progress from PT. Baseline: HEP provided at eval Goal status: INITIAL  2.  Patient will  report >/= 61% status on FOTO to indicate improved functional ability. Baseline: 44% functional status Goal status: INITIAL  3.  Patient will demonstrate bilateral cervical rotation >/= 60 deg without increase in pain in order to improve driving. Baseline: cervical rotation 45 deg bilaterally Goal status: INITIAL  4.  Patient will demonstrate DNF endurance >/= 20 seconds to improve postural control and reduce pain with golf. Baseline: DNF endurance 6 seconds Goal status: INITIAL   PLAN: PT FREQUENCY: 1x/week  PT DURATION: 8 weeks  PLANNED INTERVENTIONS: Therapeutic exercises, Therapeutic activity, Neuromuscular re-education, Balance training, Gait training, Patient/Family education, Self Care, Joint mobilization, Joint manipulation, Aquatic Therapy, Dry Needling, Spinal manipulation, Spinal mobilization, Cryotherapy, Moist heat, Taping, Manual therapy, and Re-evaluation  PLAN FOR NEXT SESSION: Review HEP and progress PRN, manual/dry needling for bilateral upper trap and suboccipitals, neck stretches, SNAG?, DNF endurance and postural strengthening   Hilda Blades, PT, DPT, LAT, ATC 06/01/22  11:25 AM Phone: [phone] Fax: [phone]

## 2022-06-11 ENCOUNTER — Ambulatory Visit: Payer: Medicare Other | Attending: Nurse Practitioner

## 2022-06-11 DIAGNOSIS — M6281 Muscle weakness (generalized): Secondary | ICD-10-CM

## 2022-06-11 DIAGNOSIS — M542 Cervicalgia: Secondary | ICD-10-CM | POA: Diagnosis not present

## 2022-06-11 DIAGNOSIS — R293 Abnormal posture: Secondary | ICD-10-CM | POA: Diagnosis not present

## 2022-06-11 NOTE — Therapy (Signed)
OUTPATIENT PHYSICAL THERAPY TREATMENT NOTE   Patient Name: Patricia Cantu MRN: [id number] DOB:1941-10-03, 81 y.o., female Today's Date: 06/11/2022  PCP: [name], NP REFERRING PROVIDER: [name], NP  END OF SESSION:   PT End of Session - 06/11/22 1013     Visit Number 2    Number of Visits 9    Date for PT Re-Evaluation 07/27/22    Authorization Type [employer] MEDICARE    Authorization Time Period FOTO by 6th    Progress Note Due on Visit 10    PT Start Time 1015    PT Stop Time 1058    PT Time Calculation (min) 43 min    Activity Tolerance Patient tolerated treatment well    Behavior During Therapy WFL for tasks assessed/performed             Past Medical History:  Diagnosis Date   Acute perichondritis of pinna    Allergic rhinitis due to pollen    Disorder of bone and cartilage, unspecified    Encounter for long-term (current) use of other medications    History of echocardiogram    Echo 9/18: EF 65-70   History of exercise stress test 06/2017   ETT 9/18: no ischemia; hypertensive BP response   HLD (hyperlipidemia)    Osteoarthritis    Panic disorder    Unspecified dermatitis due to sun    Unspecified hypothyroidism    Past Surgical History:  Procedure Laterality Date   BREAST BIOPSY Right 07/14/2016   BREAST BIOPSY Right 07/15/2016   cataract surgery Bilateral 10/12/2016   childbirth     x2 "blocks"   COLONOSCOPY     x2   TONSILLECTOMY  1949   removed   Patient Active Problem List   Diagnosis Date Noted   Chronic pain of both knees 08/25/2018   Generalized anxiety disorder with panic attacks 01/27/2018   Overweight (BMI 25.0-29.9) 01/27/2018   Family history of MI (myocardial infarction) 04/12/2017   Insomnia 11/23/2013   Premature atrial complexes 02/20/2013   Osteoarthritis of both knees 01/02/2013   Hyperlipidemia with target LDL less than 100 01/02/2013   Vitamin D deficiency 01/02/2013   Hypothyroidism 01/02/2013   Depressive  disorder, not elsewhere classified 01/02/2013    REFERRING DIAG: Neck pain  THERAPY DIAG:  Cervicalgia  Muscle weakness (generalized)  Abnormal posture  Rationale for Evaluation and Treatment Rehabilitation  PERTINENT HISTORY: Depression/anxiety  PRECAUTIONS: none   SUBJECTIVE: Patient reports she continues to have neck pain. She reports compliance with HEP. She felt like the TPDN helped.   PAIN:  Are you having pain? Yes: NPRS scale: 5/10 Pain location: Lt upper trap towards shoulder Pain description: poking Aggravating factors: head position Relieving factors: golf   OBJECTIVE: (objective measures completed at initial evaluation unless otherwise dated) PATIENT SURVEYS:  FOTO 44% functional status   COGNITION: Overall cognitive status: Within functional limits for tasks assessed   SENSATION: WFL   POSTURE:  Forward head posture, rounded shoulders   PALPATION: Tender to palpation bilateral upper trap, suboccipitals     CERVICAL ROM:    Active ROM A/PROM (deg) eval 06/11/22  Flexion 55   Extension 45   Right lateral flexion 10   Left lateral flexion 10   Right rotation 45 55  Left rotation 45 46  Patient reports left rotation "harder" than right   UPPER EXTREMITY ROM:  UE AROM grossly WFL   UPPER EXTREMITY MMT:   MMT Right eval Left eval  Shoulder flexion 5 5  Shoulder extension 4 4  Shoulder abduction 5 5  Shoulder internal rotation 5 5  Shoulder external rotation 5 4  Middle trapezius 4- 4-  Lower trapezius 4- 4-    CERVICAL SPECIAL TESTS:  Radicular testing negative   FUNCTIONAL TESTS:  DNF endurance 6 seconds     TODAY'S TREATMENT:  [facility] Adult PT Treatment:                                                DATE: 06/11/22 Therapeutic Exercise: UBE level 2 x 2 min each fwd/bwd  Scapular retraction x 5 Supine chin tuck x 5; 5 sec hold Upper trap stretch x 20 sec each Sidelying thoracic rotation x 10 each  Supine  horizontal resisted shoulder abduction red band 2 x 10  Updated HEP  Manual Therapy: STM/Trigger point release bilateral upper trap, levator scap, rhomboids, middle trap Suboccipital release STM cervical paraspinals Demo and issued tennis ball for self-soft tissue mobilization Demo theracane for self-soft tissue mobilization  Trigger Point Dry Needling Treatment: Pre-treatment instruction: Patient instructed on dry needling rationale, procedures, and possible side effects including pain during treatment (achy,cramping feeling), bruising, drop of blood, lightheadedness, nausea, sweating. Patient Consent Given: Yes Education handout provided: Previously provided Muscles treated: bilateral upper trap; Rt splenius capitis/cervicis   Treatment response/outcome: Twitch response elicited and Palpable decrease in muscle tension Post-treatment instructions: Patient instructed to expect possible mild to moderate muscle soreness later today and/or tomorrow. Patient instructed in methods to reduce muscle soreness and to continue prescribed HEP. If patient was dry needled over the lung field, patient was instructed on signs and symptoms of pneumothorax and, however unlikely, to see immediate medical attention should they occur. Patient was also educated on signs and symptoms of infection and to seek medical attention should they occur. Patient verbalized understanding of these instructions and education.   [facility] Adult PT Treatment:                                                DATE: 06/01/2022 Therapeutic Exercise: Supine chin tuck 10 x 5 sec Seated shoulder blade squeezes 10 x 5 sec Seated upper trap stretch 2 x 15 sec each Manual: Skilled palpation and monitoring of muscle tension while performing TPDN  Suboccipital release with gentle manual traction x 3 Trigger Point Dry Needling Treatment: Pre-treatment instruction: Patient instructed on dry needling rationale, procedures, and possible side effects  including pain during treatment (achy,cramping feeling), bruising, drop of blood, lightheadedness, nausea, sweating. Patient Consent Given: Yes Education handout provided: No Muscles treated: bilateral upper trap  Needle size and number: .30x60m x 3 Electrical stimulation performed: No Parameters: N/A Treatment response/outcome: Twitch response elicited and Palpable decrease in muscle tension Post-treatment instructions: Patient instructed to expect possible mild to moderate muscle soreness later today and/or tomorrow. Patient instructed in methods to reduce muscle soreness and to continue prescribed HEP. If patient was dry needled over the lung field, patient was instructed on signs and symptoms of pneumothorax and, however unlikely, to see immediate medical attention should they occur. Patient was also educated on signs and symptoms  of infection and to seek medical attention should they occur. Patient verbalized understanding of these instructions and education.     PATIENT EDUCATION:  Education details:  see treatment, TPDN Person educated: Patient Education method: Explanation, Demonstration, Tactile cues, Verbal cues, and Handouts Education comprehension: verbalized understanding, returned demonstration, verbal cues required, tactile cues required, and needs further education   HOME EXERCISE PROGRAM: Access Code: [email or web address]     ASSESSMENT: CLINICAL IMPRESSION: Patient tolerated session well today without an increase in neck pain reported. She is noted to have multiple trigger points throughout postural musculature with TPDN performed to bilateral upper traps and cervical paraspinals today. Tennis ball was issued for patient to complete soft tissue mobilization as part of home program. Able to progress postural strengthening and trunk mobility with patient requiring minimal postural cues during session.      OBJECTIVE IMPAIRMENTS decreased ROM, decreased strength, impaired flexibility,  postural dysfunction, and pain.    ACTIVITY LIMITATIONS lifting, sitting, standing, sleeping, and hygiene/grooming   PARTICIPATION LIMITATIONS: meal prep, cleaning, driving, and golf   PERSONAL FACTORS Age, Fitness, and Time since onset of injury/illness/exacerbation are also affecting patient's functional outcome.    REHAB POTENTIAL: Good   CLINICAL DECISION MAKING: Stable/uncomplicated   EVALUATION COMPLEXITY: Low     GOALS: Goals reviewed with patient? Yes   SHORT TERM GOALS: Target date: 06/29/2022    Patient will be I with initial HEP in order to progress with therapy. Baseline: HEP provided at eval Goal status: INITIAL   2.  PT will review FOTO with patient by 3rd visit in order to understand expected progress and outcome with therapy. Baseline: FOTO assessed at eval Goal status: INITIAL   3.  Patient will report pain level </= 3/10 with driving or household tasks in order to reduce functional limitations. Baseline: 7/10 pain Goal status: INITIAL   LONG TERM GOALS: Target date: 07/27/2022   Patient will be I with final HEP to maintain progress from PT. Baseline: HEP provided at eval Goal status: INITIAL   2.  Patient will report >/= 61% status on FOTO to indicate improved functional ability. Baseline: 44% functional status Goal status: INITIAL   3.  Patient will demonstrate bilateral cervical rotation >/= 60 deg without increase in pain in order to improve driving. Baseline: cervical rotation 45 deg bilaterally Goal status: INITIAL   4.  Patient will demonstrate DNF endurance >/= 20 seconds to improve postural control and reduce pain with golf. Baseline: DNF endurance 6 seconds Goal status: INITIAL     PLAN: PT FREQUENCY: 1x/week   PT DURATION: 8 weeks   PLANNED INTERVENTIONS: Therapeutic exercises, Therapeutic activity, Neuromuscular re-education, Balance training, Gait training, Patient/Family education, Self Care, Joint mobilization, Joint manipulation,  Aquatic Therapy, Dry Needling, Spinal manipulation, Spinal mobilization, Cryotherapy, Moist heat, Taping, Manual therapy, and Re-evaluation   PLAN FOR NEXT SESSION: Review HEP and progress PRN, manual/dry needling for bilateral upper trap and suboccipitals, neck stretches, SNAG?, DNF endurance and postural strengthening   Gwendolyn Grant, PT, DPT, ATC 06/11/22 11:00 AM

## 2022-06-12 ENCOUNTER — Ambulatory Visit: Payer: Medicare Other | Admitting: Physical Therapy

## 2022-06-15 ENCOUNTER — Ambulatory Visit: Payer: Medicare Other

## 2022-06-15 DIAGNOSIS — M6281 Muscle weakness (generalized): Secondary | ICD-10-CM

## 2022-06-15 DIAGNOSIS — M542 Cervicalgia: Secondary | ICD-10-CM | POA: Diagnosis not present

## 2022-06-15 DIAGNOSIS — R293 Abnormal posture: Secondary | ICD-10-CM

## 2022-06-15 NOTE — Therapy (Signed)
OUTPATIENT PHYSICAL THERAPY TREATMENT NOTE   Patient Name: Patricia Cantu MRN: [id number] DOB:1940/10/26, 81 y.o., female Today's Date: 06/15/2022  PCP: [name], NP REFERRING PROVIDER: [name], NP  END OF SESSION:   PT End of Session - 06/15/22 1228     Visit Number 3    Number of Visits 9    Date for PT Re-Evaluation 07/27/22    Authorization Type [employer] MEDICARE    Authorization Time Period FOTO by 6th    Progress Note Due on Visit 10    PT Start Time 1228    PT Stop Time 1314    PT Time Calculation (min) 46 min    Activity Tolerance Patient tolerated treatment well    Behavior During Therapy [facility] for tasks assessed/performed              Past Medical History:  Diagnosis Date   Acute perichondritis of pinna    Allergic rhinitis due to pollen    Disorder of bone and cartilage, unspecified    Encounter for long-term (current) use of other medications    History of echocardiogram    Echo 9/18: EF 65-70   History of exercise stress test 06/2017   ETT 9/18: no ischemia; hypertensive BP response   HLD (hyperlipidemia)    Osteoarthritis    Panic disorder    Unspecified dermatitis due to sun    Unspecified hypothyroidism    Past Surgical History:  Procedure Laterality Date   BREAST BIOPSY Right 07/14/2016   BREAST BIOPSY Right 07/15/2016   cataract surgery Bilateral 10/12/2016   childbirth     x2 "blocks"   COLONOSCOPY     x2   TONSILLECTOMY  1949   removed   Patient Active Problem List   Diagnosis Date Noted   Chronic pain of both knees 08/25/2018   Generalized anxiety disorder with panic attacks 01/27/2018   Overweight (BMI 25.0-29.9) 01/27/2018   Family history of MI (myocardial infarction) 04/12/2017   Insomnia 11/23/2013   Premature atrial complexes 02/20/2013   Osteoarthritis of both knees 01/02/2013   Hyperlipidemia with target LDL less than 100 01/02/2013   Vitamin D deficiency 01/02/2013   Hypothyroidism 01/02/2013    Depressive disorder, not elsewhere classified 01/02/2013    REFERRING DIAG: Neck pain  THERAPY DIAG:  Cervicalgia  Muscle weakness (generalized)  Abnormal posture  Rationale for Evaluation and Treatment Rehabilitation  PERTINENT HISTORY: Depression/anxiety  PRECAUTIONS: none   SUBJECTIVE: Patient reports compliance with HEP, but experiences some pain with upper trap stretch and sidelying thoracic rotation. She reports her pain continues to fluctuate.   PAIN:  Are you having pain? Yes: NPRS scale: 5/10 Pain location: Lt upper trap towards shoulder Pain description: poking Aggravating factors: head position Relieving factors: golf   OBJECTIVE: (objective measures completed at initial evaluation unless otherwise dated) PATIENT SURVEYS:  FOTO 44% functional status   COGNITION: Overall cognitive status: Within functional limits for tasks assessed   SENSATION: WFL   POSTURE:  Forward head posture, rounded shoulders   PALPATION: Tender to palpation bilateral upper trap, suboccipitals     CERVICAL ROM:    Active ROM A/PROM (deg) eval 06/11/22  Flexion 55   Extension 45   Right lateral flexion 10   Left lateral flexion 10   Right rotation 45 55  Left rotation 45 46  Patient reports left rotation "harder" than right   UPPER EXTREMITY ROM:  UE AROM grossly WFL   UPPER EXTREMITY MMT:   MMT Right eval Left eval  Shoulder flexion 5 5  Shoulder extension 4 4  Shoulder abduction 5 5  Shoulder internal rotation 5 5  Shoulder external rotation 5 4  Middle trapezius 4- 4-  Lower trapezius 4- 4-    CERVICAL SPECIAL TESTS:  Radicular testing negative   FUNCTIONAL TESTS:  DNF endurance 6 seconds     TODAY'S TREATMENT:  [facility] Adult PT Treatment:                                                DATE: 06/15/22 Therapeutic Exercise: UBE level 2 x 2 min each fwd/bwd  Sidelying thoracic rotation elbow bent x 10 each Upper trap stretch x 20 sec  each; d/c with sidebend to the Lt due to pain Prone scapular retraction 2 x 15  Seated horizontal shoulder abduction red band 2 x 10  Bilateral resisted shoulder ER red band 2 x 10  Shoulder crossover stretch x 30 sec  Updated HEP Manual Therapy: STM/Trigger point release bilateral upper trap and cervical paraspinals Suboccipital release Bilateral upper trap stretch  Trigger point release Lt infraspinatus  Trigger Point Dry Needling Treatment: Pre-treatment instruction: Patient instructed on dry needling rationale, procedures, and possible side effects including pain during treatment (achy,cramping feeling), bruising, drop of blood, lightheadedness, nausea, sweating. Patient Consent Given: Yes Education handout provided: Previously provided Muscles treated: Lt upper trap   Treatment response/outcome: Twitch response elicited and Palpable decrease in muscle tension Post-treatment instructions: Patient instructed to expect possible mild to moderate muscle soreness later today and/or tomorrow. Patient instructed in methods to reduce muscle soreness and to continue prescribed HEP. If patient was dry needled over the lung field, patient was instructed on signs and symptoms of pneumothorax and, however unlikely, to see immediate medical attention should they occur. Patient was also educated on signs and symptoms of infection and to seek medical attention should they occur. Patient verbalized understanding of these instructions and education.   [place] Adult PT Treatment:                                                DATE: 06/11/22 Therapeutic Exercise: UBE level 2 x 2 min each fwd/bwd  Scapular retraction x 5 Supine chin tuck x 5; 5 sec hold Upper trap stretch x 20 sec each Sidelying thoracic rotation x 10 each  Supine horizontal resisted shoulder abduction red band 2 x 10  Updated HEP  Manual Therapy: STM/Trigger point release bilateral upper trap, levator scap, rhomboids, middle  trap Suboccipital release STM cervical paraspinals Demo and issued tennis ball for self-soft tissue mobilization Demo theracane for self-soft tissue mobilization  Trigger Point Dry Needling Treatment: Pre-treatment instruction: Patient instructed on dry needling rationale, procedures, and possible side effects including pain during treatment (achy,cramping feeling), bruising, drop of blood, lightheadedness, nausea, sweating. Patient Consent Given: Yes Education handout provided: Previously provided Muscles treated: bilateral upper trap; Rt splenius capitis/cervicis   Treatment response/outcome: Twitch response elicited and Palpable decrease in muscle tension Post-treatment instructions: Patient instructed to expect possible mild to moderate muscle soreness later today and/or tomorrow. Patient instructed in methods to reduce muscle soreness and to continue prescribed  HEP. If patient was dry needled over the lung field, patient was instructed on signs and symptoms of pneumothorax and, however unlikely, to see immediate medical attention should they occur. Patient was also educated on signs and symptoms of infection and to seek medical attention should they occur. Patient verbalized understanding of these instructions and education.   [facility] Adult PT Treatment:                                                DATE: 06/01/2022 Therapeutic Exercise: Supine chin tuck 10 x 5 sec Seated shoulder blade squeezes 10 x 5 sec Seated upper trap stretch 2 x 15 sec each Manual: Skilled palpation and monitoring of muscle tension while performing TPDN  Suboccipital release with gentle manual traction x 3 Trigger Point Dry Needling Treatment: Pre-treatment instruction: Patient instructed on dry needling rationale, procedures, and possible side effects including pain during treatment (achy,cramping feeling), bruising, drop of blood, lightheadedness, nausea, sweating. Patient Consent Given: Yes Education handout  provided: No Muscles treated: bilateral upper trap  Needle size and number: .30x69m x 3 Electrical stimulation performed: No Parameters: N/A Treatment response/outcome: Twitch response elicited and Palpable decrease in muscle tension Post-treatment instructions: Patient instructed to expect possible mild to moderate muscle soreness later today and/or tomorrow. Patient instructed in methods to reduce muscle soreness and to continue prescribed HEP. If patient was dry needled over the lung field, patient was instructed on signs and symptoms of pneumothorax and, however unlikely, to see immediate medical attention should they occur. Patient was also educated on signs and symptoms of infection and to seek medical attention should they occur. Patient verbalized understanding of these instructions and education.     PATIENT EDUCATION:  Education details:  see treatment Person educated: Patient Education method: Explanation, Demonstration, Tactile cues, Verbal cues, and Handouts Education comprehension: verbalized understanding, returned demonstration, verbal cues required, tactile cues required, and needs further education   HOME EXERCISE PROGRAM: Access Code: [email or web address]    ASSESSMENT: CLINICAL IMPRESSION: Patient tolerated session well today with continued emphasis on reducing cervical/periscapular myofascial restriction and postural strengthening. Reviewed home exercises that patient reported were painful over the last week. Modified sidelying thoracic rotation by having the elbow bent, which did not elicit shoulder pain so was encouraged to complete in this position at home. Upper trap stretch to left caused increased pain, so this was discontinued and she was instructed to not complete as part of HEP. Able to progress postural strengthening with patient reporting pain localized to the Lt infraspinatus when performing bilateral shoulder ER. Trigger point release was performed with patient reporting  a reduction in pain with this exercise following this manual therapy technique. She was encouraged to continue to utilize her tennis ball for self-soft tissue mobilization and HEP was updated to include further strengthening.      OBJECTIVE IMPAIRMENTS decreased ROM, decreased strength, impaired flexibility, postural dysfunction, and pain.    ACTIVITY LIMITATIONS lifting, sitting, standing, sleeping, and hygiene/grooming   PARTICIPATION LIMITATIONS: meal prep, cleaning, driving, and golf   PERSONAL FACTORS Age, Fitness, and Time since onset of injury/illness/exacerbation are also affecting patient's functional outcome.    REHAB POTENTIAL: Good   CLINICAL DECISION MAKING: Stable/uncomplicated   EVALUATION COMPLEXITY: Low     GOALS: Goals reviewed with patient? Yes   SHORT TERM GOALS: Target date: 06/29/2022    Patient  will be I with initial HEP in order to progress with therapy. Baseline: HEP provided at eval Goal status: INITIAL   2.  PT will review FOTO with patient by 3rd visit in order to understand expected progress and outcome with therapy. Baseline: FOTO assessed at eval Goal status: INITIAL   3.  Patient will report pain level </= 3/10 with driving or household tasks in order to reduce functional limitations. Baseline: 7/10 pain Goal status: INITIAL   LONG TERM GOALS: Target date: 07/27/2022   Patient will be I with final HEP to maintain progress from PT. Baseline: HEP provided at eval Goal status: INITIAL   2.  Patient will report >/= 61% status on FOTO to indicate improved functional ability. Baseline: 44% functional status Goal status: INITIAL   3.  Patient will demonstrate bilateral cervical rotation >/= 60 deg without increase in pain in order to improve driving. Baseline: cervical rotation 45 deg bilaterally Goal status: INITIAL   4.  Patient will demonstrate DNF endurance >/= 20 seconds to improve postural control and reduce pain with golf. Baseline: DNF  endurance 6 seconds Goal status: INITIAL     PLAN: PT FREQUENCY: 1x/week   PT DURATION: 8 weeks   PLANNED INTERVENTIONS: Therapeutic exercises, Therapeutic activity, Neuromuscular re-education, Balance training, Gait training, Patient/Family education, Self Care, Joint mobilization, Joint manipulation, Aquatic Therapy, Dry Needling, Spinal manipulation, Spinal mobilization, Cryotherapy, Moist heat, Taping, Manual therapy, and Re-evaluation   PLAN FOR NEXT SESSION: Review HEP and progress PRN, manual/dry needling for bilateral upper trap and suboccipitals, neck stretches, SNAG?, DNF endurance and postural strengthening   Gwendolyn Grant, PT, DPT, ATC 06/15/22 1:17 PM

## 2022-06-22 ENCOUNTER — Ambulatory Visit: Payer: Medicare Other

## 2022-06-22 DIAGNOSIS — R293 Abnormal posture: Secondary | ICD-10-CM | POA: Diagnosis not present

## 2022-06-22 DIAGNOSIS — Z9841 Cataract extraction status, right eye: Secondary | ICD-10-CM | POA: Diagnosis not present

## 2022-06-22 DIAGNOSIS — H0288B Meibomian gland dysfunction left eye, upper and lower eyelids: Secondary | ICD-10-CM | POA: Diagnosis not present

## 2022-06-22 DIAGNOSIS — H0288A Meibomian gland dysfunction right eye, upper and lower eyelids: Secondary | ICD-10-CM | POA: Diagnosis not present

## 2022-06-22 DIAGNOSIS — M542 Cervicalgia: Secondary | ICD-10-CM

## 2022-06-22 DIAGNOSIS — M6281 Muscle weakness (generalized): Secondary | ICD-10-CM | POA: Diagnosis not present

## 2022-06-22 DIAGNOSIS — Z9842 Cataract extraction status, left eye: Secondary | ICD-10-CM | POA: Diagnosis not present

## 2022-06-22 NOTE — Therapy (Signed)
OUTPATIENT PHYSICAL THERAPY TREATMENT NOTE   Patient Name: Patricia Cantu MRN: [id number] DOB:[birth year], 81 y.o., female Today's Date: 06/22/2022  PCP: [name], NP REFERRING PROVIDER: [name], NP  END OF SESSION:   PT End of Session - 06/22/22 1016     Visit Number 4    Number of Visits 9    Date for PT Re-Evaluation 07/27/22    Authorization Type [employer] MEDICARE    Authorization Time Period FOTO by 6th    Progress Note Due on Visit 10    PT Start Time 1016    PT Stop Time 1058    PT Time Calculation (min) 42 min    Activity Tolerance Patient tolerated treatment well    Behavior During Therapy WFL for tasks assessed/performed              Past Medical History:  Diagnosis Date   Acute perichondritis of pinna    Allergic rhinitis due to pollen    Disorder of bone and cartilage, unspecified    Encounter for long-term (current) use of other medications    History of echocardiogram    Echo 9/18: EF 65-70   History of exercise stress test 06/2017   ETT 9/18: no ischemia; hypertensive BP response   HLD (hyperlipidemia)    Osteoarthritis    Panic disorder    Unspecified dermatitis due to sun    Unspecified hypothyroidism    Past Surgical History:  Procedure Laterality Date   BREAST BIOPSY Right 07/14/2016   BREAST BIOPSY Right 07/15/2016   cataract surgery Bilateral 10/12/2016   childbirth     x2 "blocks"   COLONOSCOPY     x2   TONSILLECTOMY  1949   removed   Patient Active Problem List   Diagnosis Date Noted   Chronic pain of both knees 08/25/2018   Generalized anxiety disorder with panic attacks 01/27/2018   Overweight (BMI 25.0-29.9) 01/27/2018   Family history of MI (myocardial infarction) 04/12/2017   Insomnia 11/23/2013   Premature atrial complexes 02/20/2013   Osteoarthritis of both knees 01/02/2013   Hyperlipidemia with target LDL less than 100 01/02/2013   Vitamin D deficiency 01/02/2013   Hypothyroidism 01/02/2013    Depressive disorder, not elsewhere classified 01/02/2013    REFERRING DIAG: Neck pain  THERAPY DIAG:  Cervicalgia  Muscle weakness (generalized)  Abnormal posture  Rationale for Evaluation and Treatment Rehabilitation  PERTINENT HISTORY: Depression/anxiety  PRECAUTIONS: none   SUBJECTIVE: Patient reports she feels like she is getting better. Sometimes she sleeps wrong and it hurts more, but otherwise feels a little better.   PAIN:  Are you having pain? Yes: NPRS scale: 3/10 Pain location: Lt upper trap towards shoulder Pain description: poking Aggravating factors: head position Relieving factors: golf   OBJECTIVE: (objective measures completed at initial evaluation unless otherwise dated) PATIENT SURVEYS:  FOTO 44% functional status   COGNITION: Overall cognitive status: Within functional limits for tasks assessed   SENSATION: WFL   POSTURE:  Forward head posture, rounded shoulders   PALPATION: Tender to palpation bilateral upper trap, suboccipitals     CERVICAL ROM:    Active ROM A/PROM (deg) eval 06/11/22 06/22/22  Flexion 55  60  Extension 45  55  Right lateral flexion 10    Left lateral flexion 10    Right rotation 45 55   Left rotation 45 46 45  Patient reports left rotation "harder" than right   UPPER EXTREMITY ROM:  UE AROM grossly WFL   UPPER EXTREMITY MMT:   MMT Right eval Left eval  Shoulder flexion 5 5  Shoulder extension 4 4  Shoulder abduction 5 5  Shoulder internal rotation 5 5  Shoulder external rotation 5 4  Middle trapezius 4- 4-  Lower trapezius 4- 4-    CERVICAL SPECIAL TESTS:  Radicular testing negative   FUNCTIONAL TESTS:  DNF endurance 6 seconds     TODAY'S TREATMENT:  [facility] Adult PT Treatment:                                                DATE: 06/22/22 Therapeutic Exercise: UBE level 2.5 x 2 min each fwd/bwd  Cervical rotation SNAG 1 x 10 each  Cervical extension SNAG 1 x 10  Cervical  retraction 2 x 10; 5 sec hold  Resisted shoulder extension red band 2 x 15  Resisted shoulder horizontal abduction standing red band 2 x 15    [facility] Adult PT Treatment:                                                DATE: 06/15/22 Therapeutic Exercise: UBE level 2 x 2 min each fwd/bwd  Sidelying thoracic rotation elbow bent x 10 each Upper trap stretch x 20 sec each; d/c with sidebend to the Lt due to pain Prone scapular retraction 2 x 15  Seated horizontal shoulder abduction red band 2 x 10  Bilateral resisted shoulder ER red band 2 x 10  Shoulder crossover stretch x 30 sec  Updated HEP Manual Therapy: STM/Trigger point release bilateral upper trap and cervical paraspinals Suboccipital release Bilateral upper trap stretch  Trigger point release Lt infraspinatus  Trigger Point Dry Needling Treatment: Pre-treatment instruction: Patient instructed on dry needling rationale, procedures, and possible side effects including pain during treatment (achy,cramping feeling), bruising, drop of blood, lightheadedness, nausea, sweating. Patient Consent Given: Yes Education handout provided: Previously provided Muscles treated: Lt upper trap   Treatment response/outcome: Twitch response elicited and Palpable decrease in muscle tension Post-treatment instructions: Patient instructed to expect possible mild to moderate muscle soreness later today and/or tomorrow. Patient instructed in methods to reduce muscle soreness and to continue prescribed HEP. If patient was dry needled over the lung field, patient was instructed on signs and symptoms of pneumothorax and, however unlikely, to see immediate medical attention should they occur. Patient was also educated on signs and symptoms of infection and to seek medical attention should they occur. Patient verbalized understanding of these instructions and education.   [facility] Adult PT Treatment:                                                DATE:  06/11/22 Therapeutic Exercise: UBE level 2 x 2 min each fwd/bwd  Scapular retraction x 5 Supine chin tuck x 5; 5 sec hold Upper trap stretch x 20 sec each Sidelying thoracic rotation x 10 each  Supine horizontal resisted shoulder abduction red band 2 x 10  Updated HEP  Manual Therapy: STM/Trigger point release bilateral upper trap, levator scap, rhomboids, middle trap  Suboccipital release STM cervical paraspinals Demo and issued tennis ball for self-soft tissue mobilization Demo theracane for self-soft tissue mobilization  Trigger Point Dry Needling Treatment: Pre-treatment instruction: Patient instructed on dry needling rationale, procedures, and possible side effects including pain during treatment (achy,cramping feeling), bruising, drop of blood, lightheadedness, nausea, sweating. Patient Consent Given: Yes Education handout provided: Previously provided Muscles treated: bilateral upper trap; Rt splenius capitis/cervicis   Treatment response/outcome: Twitch response elicited and Palpable decrease in muscle tension Post-treatment instructions: Patient instructed to expect possible mild to moderate muscle soreness later today and/or tomorrow. Patient instructed in methods to reduce muscle soreness and to continue prescribed HEP. If patient was dry needled over the lung field, patient was instructed on signs and symptoms of pneumothorax and, however unlikely, to see immediate medical attention should they occur. Patient was also educated on signs and symptoms of infection and to seek medical attention should they occur. Patient verbalized understanding of these instructions and education.       PATIENT EDUCATION:  Education details:  N/A Person educated: N/A Education method: N/A Education comprehension: N/A   HOME EXERCISE PROGRAM: Access Code: [email or web address]     ASSESSMENT: CLINICAL IMPRESSION: Patient noted to have improved cervical flexion and extension AROM compared to evaluation,  but no change in left rotation. Session focused on improving cervical mobility and progression of postural strengthening with good tolerance. She has initial difficulty performing cervical retraction as she has tendency to flex as opposed to retract. With heavy cueing she is able to properly perform in sitting. Minimal postural cues required throughout session to decrease excessive upper trap engagement.      OBJECTIVE IMPAIRMENTS decreased ROM, decreased strength, impaired flexibility, postural dysfunction, and pain.    ACTIVITY LIMITATIONS lifting, sitting, standing, sleeping, and hygiene/grooming   PARTICIPATION LIMITATIONS: meal prep, cleaning, driving, and golf   PERSONAL FACTORS Age, Fitness, and Time since onset of injury/illness/exacerbation are also affecting patient's functional outcome.    REHAB POTENTIAL: Good   CLINICAL DECISION MAKING: Stable/uncomplicated   EVALUATION COMPLEXITY: Low     GOALS: Goals reviewed with patient? Yes   SHORT TERM GOALS: Target date: 06/29/2022    Patient will be I with initial HEP in order to progress with therapy. Baseline: HEP provided at eval Status: 06/22/22: cues for cervical retraction  Goal status: ongoing    2.  PT will review FOTO with patient by 3rd visit in order to understand expected progress and outcome with therapy. Baseline: FOTO assessed at eval Goal status: INITIAL   3.  Patient will report pain level </= 3/10 with driving or household tasks in order to reduce functional limitations. Baseline: 7/10 pain Status: 06/22/22: 3-4/10  Goal status: ongoing    LONG TERM GOALS: Target date: 07/27/2022   Patient will be I with final HEP to maintain progress from PT. Baseline: HEP provided at eval Goal status: INITIAL   2.  Patient will report >/= 61% status on FOTO to indicate improved functional ability. Baseline: 44% functional status Goal status: INITIAL   3.  Patient will demonstrate bilateral cervical rotation >/= 60  deg without increase in pain in order to improve driving. Baseline: cervical rotation 45 deg bilaterally Goal status: INITIAL   4.  Patient will demonstrate DNF endurance >/= 20 seconds to improve postural control and reduce pain with golf. Baseline: DNF endurance 6 seconds Goal status: INITIAL     PLAN: PT FREQUENCY: 1x/week   PT DURATION: 8 weeks   PLANNED INTERVENTIONS: Therapeutic exercises, Therapeutic  activity, Neuromuscular re-education, Balance training, Gait training, Patient/Family education, Self Care, Joint mobilization, Joint manipulation, Aquatic Therapy, Dry Needling, Spinal manipulation, Spinal mobilization, Cryotherapy, Moist heat, Taping, Manual therapy, and Re-evaluation   PLAN FOR NEXT SESSION: Review HEP and progress PRN, manual/dry needling for bilateral upper trap and suboccipitals, neck stretches, SNAG, DNF endurance and postural strengthening   Gwendolyn Grant, PT, DPT, ATC 06/22/22 10:59 AM

## 2022-07-01 ENCOUNTER — Ambulatory Visit: Payer: Medicare Other | Admitting: Physical Therapy

## 2022-07-01 ENCOUNTER — Encounter: Payer: Self-pay | Admitting: Physical Therapy

## 2022-07-01 ENCOUNTER — Other Ambulatory Visit: Payer: Self-pay

## 2022-07-01 DIAGNOSIS — R293 Abnormal posture: Secondary | ICD-10-CM | POA: Diagnosis not present

## 2022-07-01 DIAGNOSIS — M6281 Muscle weakness (generalized): Secondary | ICD-10-CM

## 2022-07-01 DIAGNOSIS — M542 Cervicalgia: Secondary | ICD-10-CM | POA: Diagnosis not present

## 2022-07-01 NOTE — Patient Instructions (Signed)
Access Code: [email or web address] URL: [email or web address] Date: 07/01/2022 Prepared by: Hilda Blades  Exercises - Supine Cervical Retraction with Towel  - 2 x daily - 10-15 reps - 5 seconds hold - Seated Cervical Sidebending Stretch  - 2 x daily - 3 reps - 20 seconds hold - Sidelying Thoracic and Shoulder Rotation  - 1 x daily - 1 sets - 10 reps - Seated Shoulder Horizontal Abduction with Resistance  - 1 x daily - 2 sets - 10 reps - Prone Scapular Slide with Shoulder Extension  - 1 x daily - 2 sets - 10 reps - Seated Assisted Cervical Rotation with Towel  - 1-2 x daily - 2 sets - 10 reps - Doorway Pec Stretch at 60 Degrees Abduction with Arm Straight  - 1-2 x daily - 3 sets - 20 seconds hold

## 2022-07-01 NOTE — Therapy (Signed)
OUTPATIENT PHYSICAL THERAPY TREATMENT NOTE   Patient Name: Patricia Cantu MRN: [id number] DOB:1941/10/02, 81 y.o., female Today's Date: 07/01/2022  PCP: [name], NP REFERRING PROVIDER: [name], NP  END OF SESSION:   PT End of Session - 07/01/22 1055     Visit Number 5    Number of Visits 9    Date for PT Re-Evaluation 07/27/22    Authorization Type [employer] MEDICARE    Authorization Time Period FOTO by 6th    Progress Note Due on Visit 10    PT Start Time 1045    PT Stop Time 1130    PT Time Calculation (min) 45 min    Activity Tolerance Patient tolerated treatment well    Behavior During Therapy WFL for tasks assessed/performed               Past Medical History:  Diagnosis Date   Acute perichondritis of pinna    Allergic rhinitis due to pollen    Disorder of bone and cartilage, unspecified    Encounter for long-term (current) use of other medications    History of echocardiogram    Echo 9/18: EF 65-70   History of exercise stress test 06/2017   ETT 9/18: no ischemia; hypertensive BP response   HLD (hyperlipidemia)    Osteoarthritis    Panic disorder    Unspecified dermatitis due to sun    Unspecified hypothyroidism    Past Surgical History:  Procedure Laterality Date   BREAST BIOPSY Right 07/14/2016   BREAST BIOPSY Right 07/15/2016   cataract surgery Bilateral 10/12/2016   childbirth     x2 "blocks"   COLONOSCOPY     x2   TONSILLECTOMY  1949   removed   Patient Active Problem List   Diagnosis Date Noted   Chronic pain of both knees 08/25/2018   Generalized anxiety disorder with panic attacks 01/27/2018   Overweight (BMI 25.0-29.9) 01/27/2018   Family history of MI (myocardial infarction) 04/12/2017   Insomnia 11/23/2013   Premature atrial complexes 02/20/2013   Osteoarthritis of both knees 01/02/2013   Hyperlipidemia with target LDL less than 100 01/02/2013   Vitamin D deficiency 01/02/2013   Hypothyroidism 01/02/2013    Depressive disorder, not elsewhere classified 01/02/2013    REFERRING DIAG: Neck pain  THERAPY DIAG:  Cervicalgia  Muscle weakness (generalized)  Abnormal posture  Rationale for Evaluation and Treatment Rehabilitation  PERTINENT HISTORY: Depression/anxiety  PRECAUTIONS: none   SUBJECTIVE: Patient reports she still feels some discomfort on the left neck and has difficulty turning her head to the left.  PAIN:  Are you having pain? Yes:  NPRS scale: 3/10 Pain location: Left neck and upper trap region Pain description: poking Aggravating factors: head position Relieving factors: golf   OBJECTIVE: (objective measures completed at initial evaluation unless otherwise dated) PATIENT SURVEYS:  FOTO 44% functional status   POSTURE:  Forward head posture, rounded shoulders   PALPATION: Tender to palpation bilateral upper trap, suboccipitals     CERVICAL ROM:    Active ROM A/PROM (deg) eval 06/11/22 06/22/22 07/01/2022  Flexion 55  60   Extension 45  55   Right lateral flexion 10     Left lateral flexion 10     Right rotation 45 55  55  Left rotation 45 46 45 50  Patient reports left rotation "harder" than right   UPPER EXTREMITY ROM:  UE AROM grossly WFL   UPPER EXTREMITY MMT:   MMT Right eval Left eval  Shoulder flexion 5 5  Shoulder extension 4 4  Shoulder abduction 5 5  Shoulder internal rotation 5 5  Shoulder external rotation 5 4  Middle trapezius 4- 4-  Lower trapezius 4- 4-    CERVICAL SPECIAL TESTS:  Radicular testing negative   FUNCTIONAL TESTS:  DNF endurance 6 seconds     TODAY'S TREATMENT:  [facility] Adult PT Treatment:                                                DATE: 07/01/22 Therapeutic Exercise: UBE L3 x 4 min (2 fwd/bwd) while taking subjective Cervical rotation SNAG with towel 2 x 10 to left Doorway pec stretch at 60 deg 3 x 20 sec Sidelying thoracic rotation x 10 each Seated chin tuck x 10 Manual: Skilled  palpation and monitoring of muscle tension while performing TPDN STM and TPR for left upper trap region Trigger Point Dry Needling Treatment: Pre-treatment instruction: Patient instructed on dry needling rationale, procedures, and possible side effects including pain during treatment (achy,cramping feeling), bruising, drop of blood, lightheadedness, nausea, sweating. Patient Consent Given: Yes Education handout provided: Previously provided Muscles treated: Left upper trap  Needle size and number: .30x35m x 4 Electrical stimulation performed: No Parameters: N/A Treatment response/outcome: Twitch response elicited and Palpable decrease in muscle tension Post-treatment instructions: Patient instructed to expect possible mild to moderate muscle soreness later today and/or tomorrow. Patient instructed in methods to reduce muscle soreness and to continue prescribed HEP. If patient was dry needled over the lung field, patient was instructed on signs and symptoms of pneumothorax and, however unlikely, to see immediate medical attention should they occur. Patient was also educated on signs and symptoms of infection and to seek medical attention should they occur. Patient verbalized understanding of these instructions and education.   [facility]                                                [facility]: [facility] Therapeutic Exercise: UBE level 2.5 x 2 min each fwd/bwd  Cervical rotation SNAG 1 x 10 each  Cervical extension SNAG 1 x 10  Cervical retraction 2 x 10; 5 sec hold  Resisted shoulder extension red band 2 x 15  Resisted shoulder horizontal abduction standing red band 2 x 15   [facility] Adult PT Treatment:                                                DATE: 06/15/22 Therapeutic Exercise: UBE level 2 x 2 min each fwd/bwd  Sidelying thoracic rotation elbow bent x 10 each Upper trap stretch x 20 sec each; d/c with sidebend to the Lt due to pain Prone scapular retraction 2 x 15  Seated  horizontal shoulder abduction red band 2 x 10  Bilateral resisted shoulder ER red band 2 x 10  Shoulder crossover stretch x 30 sec  Updated HEP Manual Therapy: STM/Trigger point release bilateral upper trap and cervical paraspinals Suboccipital release Bilateral upper  trap stretch  Trigger point release Lt infraspinatus  Trigger Point Dry Needling Treatment: Pre-treatment instruction: Patient instructed on dry needling rationale, procedures, and possible side effects including pain during treatment (achy,cramping feeling), bruising, drop of blood, lightheadedness, nausea, sweating. Patient Consent Given: Yes Education handout provided: Previously provided Muscles treated: Lt upper trap   Treatment response/outcome: Twitch response elicited and Palpable decrease in muscle tension Post-treatment instructions: Patient instructed to expect possible mild to moderate muscle soreness later today and/or tomorrow. Patient instructed in methods to reduce muscle soreness and to continue prescribed HEP. If patient was dry needled over the lung field, patient was instructed on signs and symptoms of pneumothorax and, however unlikely, to see immediate medical attention should they occur. Patient was also educated on signs and symptoms of infection and to seek medical attention should they occur. Patient verbalized understanding of these instructions and education.   PATIENT EDUCATION:  Education details: HEP update Person educated: Patient Education method: Explanation, Demonstration, Tactile cues, Verbal cues, and Handouts Education comprehension: verbalized understanding, returned demonstration, verbal cues required, tactile cues required, and needs further education   HOME EXERCISE PROGRAM: Access Code: [email or web address]     ASSESSMENT: CLINICAL IMPRESSION: Patient tolerated therapy well with no adverse effects. Continued with TPDN this visit for left upper trap region with good therapeutic benefit. Patient  does exhibit improved left cervical rotation and is progressing well with exercises. Therapy continued to focus on progressing cervical and thoracic mobility, and postural control. She does require cueing for proper exercise technique and posture. Updated HEP to included cervical SNAGs and pec stretch. Patient would benefit from continued skilled PT to progress her mobility and strength in order to reduce pain and maximize functional ability.     OBJECTIVE IMPAIRMENTS decreased ROM, decreased strength, impaired flexibility, postural dysfunction, and pain.    ACTIVITY LIMITATIONS lifting, sitting, standing, sleeping, and hygiene/grooming   PARTICIPATION LIMITATIONS: meal prep, cleaning, driving, and golf   PERSONAL FACTORS Age, Fitness, and Time since onset of injury/illness/exacerbation are also affecting patient's functional outcome.      GOALS: Goals reviewed with patient? Yes   SHORT TERM GOALS: Target date: 06/29/2022    Patient will be I with initial HEP in order to progress with therapy. Baseline: HEP provided at eval Status: 06/22/22: cues for cervical retraction  Goal status: ongoing    2.  PT will review FOTO with patient by 3rd visit in order to understand expected progress and outcome with therapy. Baseline: FOTO assessed at eval Goal status: INITIAL   3.  Patient will report pain level </= 3/10 with driving or household tasks in order to reduce functional limitations. Baseline: 7/10 pain Status: 06/22/22: 3-4/10  Goal status: ongoing    LONG TERM GOALS: Target date: 07/27/2022   Patient will be I with final HEP to maintain progress from PT. Baseline: HEP provided at eval Goal status: INITIAL   2.  Patient will report >/= 61% status on FOTO to indicate improved functional ability. Baseline: 44% functional status Goal status: INITIAL   3.  Patient will demonstrate bilateral cervical rotation >/= 60 deg without increase in pain in order to improve driving. Baseline:  cervical rotation 45 deg bilaterally Goal status: INITIAL   4.  Patient will demonstrate DNF endurance >/= 20 seconds to improve postural control and reduce pain with golf. Baseline: DNF endurance 6 seconds Goal status: INITIAL     PLAN: PT FREQUENCY: 1x/week   PT DURATION: 8 weeks   PLANNED INTERVENTIONS: Therapeutic exercises, Therapeutic  activity, Neuromuscular re-education, Balance training, Gait training, Patient/Family education, Self Care, Joint mobilization, Joint manipulation, Aquatic Therapy, Dry Needling, Spinal manipulation, Spinal mobilization, Cryotherapy, Moist heat, Taping, Manual therapy, and Re-evaluation   PLAN FOR NEXT SESSION: Review HEP and progress PRN, manual/dry needling for bilateral upper trap and suboccipitals, neck stretches, SNAG, DNF endurance and postural strengthening    Hilda Blades, PT, DPT, LAT, ATC 07/01/22  12:37 PM Phone: [phone] Fax: [phone]

## 2022-07-07 NOTE — Therapy (Signed)
OUTPATIENT PHYSICAL THERAPY TREATMENT NOTE   Patient Name: Patricia Cantu MRN: [id number] DOB:[birth year], 81 y.o., female Today's Date: 07/08/2022  PCP: [name], NP REFERRING PROVIDER: [name], NP   END OF SESSION:   PT End of Session - 07/08/22 1528     Visit Number 6    Number of Visits 9    Date for PT Re-Evaluation 07/27/22    Authorization Type [employer] MEDICARE    Authorization Time Period FOTO by 6th    Progress Note Due on Visit 10    PT Start Time 1530    PT Stop Time 1615    PT Time Calculation (min) 45 min    Activity Tolerance Patient tolerated treatment well    Behavior During Therapy [facility] for tasks assessed/performed                Past Medical History:  Diagnosis Date   Acute perichondritis of pinna    Allergic rhinitis due to pollen    Disorder of bone and cartilage, unspecified    Encounter for long-term (current) use of other medications    History of echocardiogram    Echo 9/18: EF 65-70   History of exercise stress test 06/2017   ETT 9/18: no ischemia; hypertensive BP response   HLD (hyperlipidemia)    Osteoarthritis    Panic disorder    Unspecified dermatitis due to sun    Unspecified hypothyroidism    Past Surgical History:  Procedure Laterality Date   BREAST BIOPSY Right 07/14/2016   BREAST BIOPSY Right 07/15/2016   cataract surgery Bilateral 10/12/2016   childbirth     x2 "blocks"   COLONOSCOPY     x2   TONSILLECTOMY  1949   removed   Patient Active Problem List   Diagnosis Date Noted   Chronic pain of both knees 08/25/2018   Generalized anxiety disorder with panic attacks 01/27/2018   Overweight (BMI 25.0-29.9) 01/27/2018   Family history of MI (myocardial infarction) 04/12/2017   Insomnia 11/23/2013   Premature atrial complexes 02/20/2013   Osteoarthritis of both knees 01/02/2013   Hyperlipidemia with target LDL less than 100 01/02/2013   Vitamin D deficiency 01/02/2013   Hypothyroidism 01/02/2013    Depressive disorder, not elsewhere classified 01/02/2013    REFERRING DIAG: Neck pain  THERAPY DIAG:  Cervicalgia  Muscle weakness (generalized)  Abnormal posture  Rationale for Evaluation and Treatment Rehabilitation  PERTINENT HISTORY: Depression/anxiety  PRECAUTIONS: None    SUBJECTIVE: Patient reports she is doing well. She feels better after last visit except still pain mainly in the morning. She notes a spot on the left side of her neck that remains tight.  PAIN:  Are you having pain? Yes:  NPRS scale: 2/10 Pain location: Left neck and upper trap region Pain description: poking Aggravating factors: head position Relieving factors: golf   OBJECTIVE: (objective measures completed at initial evaluation unless otherwise dated) PATIENT SURVEYS:  FOTO 44% functional status  07/08/2022: 61%   POSTURE:  Forward head posture, rounded shoulders   PALPATION: Tender to palpation bilateral upper trap, suboccipitals     CERVICAL ROM:    Active ROM A/PROM (deg) eval 06/11/22 06/22/22 07/01/2022 07/08/2022  Flexion 55  60    Extension 45  55    Right lateral flexion 10      Left lateral flexion 10      Right rotation 45 55  55 60  Left rotation 45 46 45 50 60  Patient reports left  rotation "harder" than right   UPPER EXTREMITY ROM:                       UE AROM grossly WFL   UPPER EXTREMITY MMT:   MMT Right eval Left eval  Shoulder flexion 5 5  Shoulder extension 4 4  Shoulder abduction 5 5  Shoulder internal rotation 5 5  Shoulder external rotation 5 4  Middle trapezius 4- 4-  Lower trapezius 4- 4-    CERVICAL SPECIAL TESTS:  Radicular testing negative   FUNCTIONAL TESTS:  DNF endurance 6 seconds     TODAY'S TREATMENT:  [facility] Adult PT Treatment:                                                DATE: 07/08/22 Therapeutic Exercise: UBE L3 x 5 min (2.5 fwd/bwd) while taking subjective Cervical rotation SNAG with towel 2 x 10 to left Doorway pec stretch at 60  deg 3 x 20 sec Sidelying thoracic rotation x 10 each Manual: Skilled palpation and monitoring of muscle tension while performing TPDN STM and TPR for left upper trap region Suboccipital release with gentle manual traction x 3 bouts Supine cervical side glide mobs to left Trigger Point Dry Needling Treatment: Pre-treatment instruction: Patient instructed on dry needling rationale, procedures, and possible side effects including pain during treatment (achy,cramping feeling), bruising, drop of blood, lightheadedness, nausea, sweating. Patient Consent Given: Yes Education handout provided: Previously provided Muscles treated: Left upper trap, splenius cervicis, cervical multifidi  Needle size and number: .30x63m x 4 Electrical stimulation performed: No Parameters: N/A Treatment response/outcome: Twitch response elicited and Palpable decrease in muscle tension Post-treatment instructions: Patient instructed to expect possible mild to moderate muscle soreness later today and/or tomorrow. Patient instructed in methods to reduce muscle soreness and to continue prescribed HEP. If patient was dry needled over the lung field, patient was instructed on signs and symptoms of pneumothorax and, however unlikely, to see immediate medical attention should they occur. Patient was also educated on signs and symptoms of infection and to seek medical attention should they occur. Patient verbalized understanding of these instructions and education.   [facility]                                                [facility]: [facility] Therapeutic Exercise: UBE L3 x 4 min (2 fwd/bwd) while taking subjective Cervical rotation SNAG with towel 2 x 10 to left Doorway pec stretch at 60 deg 3 x 20 sec Sidelying thoracic rotation x 10 each Seated chin tuck x 10 Manual: Skilled palpation and monitoring of muscle tension while performing TPDN STM and TPR for left upper trap region Trigger Point Dry Needling  Treatment: Pre-treatment instruction: Patient instructed on dry needling rationale, procedures, and possible side effects including pain during treatment (achy,cramping feeling), bruising, drop of blood, lightheadedness, nausea, sweating. Patient Consent Given: Yes Education handout provided: Previously provided Muscles treated: Left upper trap  Needle size and number: .[id number] 4 Electrical stimulation performed: No Parameters: N/A Treatment response/outcome: Twitch response elicited and Palpable decrease in muscle tension Post-treatment instructions: Patient instructed to expect possible mild to moderate muscle soreness later today and/or tomorrow. Patient  instructed in methods to reduce muscle soreness and to continue prescribed HEP. If patient was dry needled over the lung field, patient was instructed on signs and symptoms of pneumothorax and, however unlikely, to see immediate medical attention should they occur. Patient was also educated on signs and symptoms of infection and to seek medical attention should they occur. Patient verbalized understanding of these instructions and education.  [facility] Adult PT Treatment:                                                DATE: 06/22/22 Therapeutic Exercise: UBE level 2.5 x 2 min each fwd/bwd  Cervical rotation SNAG 1 x 10 each  Cervical extension SNAG 1 x 10  Cervical retraction 2 x 10; 5 sec hold  Resisted shoulder extension red band 2 x 15  Resisted shoulder horizontal abduction standing red band 2 x 15    PATIENT EDUCATION:  Education details: HEP Person educated: Patient Education method: Consulting civil engineer, Demonstration, Corporate treasurer cues, Verbal cues Education comprehension: verbalized understanding, returned demonstration, verbal cues required, tactile cues required, and needs further education   HOME EXERCISE PROGRAM: Access Code: [email or web address]     ASSESSMENT: CLINICAL IMPRESSION: Patient tolerated therapy well with no adverse effects. She  reports improvement in her symptoms and with her functional ability, achieving her FOTO goal. She also exhibits improve cervical rotation this visit. Continued with TPDN for left cervical region followed my manual and mobility exercises. No changes made to HEP this visit. Patient would benefit from continued skilled PT to progress her mobility and strength in order to reduce pain and maximize functional ability.     OBJECTIVE IMPAIRMENTS decreased ROM, decreased strength, impaired flexibility, postural dysfunction, and pain.    ACTIVITY LIMITATIONS lifting, sitting, standing, sleeping, and hygiene/grooming   PARTICIPATION LIMITATIONS: meal prep, cleaning, driving, and golf   PERSONAL FACTORS Age, Fitness, and Time since onset of injury/illness/exacerbation are also affecting patient's functional outcome.      GOALS: Goals reviewed with patient? Yes   SHORT TERM GOALS: Target date: 06/29/2022    Patient will be I with initial HEP in order to progress with therapy. Baseline: HEP provided at eval Status: 06/22/22: cues for cervical retraction  Goal status: ongoing    2.  PT will review FOTO with patient by 3rd visit in order to understand expected progress and outcome with therapy. Baseline: FOTO assessed at eval Goal status: INITIAL   3.  Patient will report pain level </= 3/10 with driving or household tasks in order to reduce functional limitations. Baseline: 7/10 pain Status: 06/22/22: 3-4/10  Goal status: ongoing    LONG TERM GOALS: Target date: 07/27/2022   Patient will be I with final HEP to maintain progress from PT. Baseline: HEP provided at eval Goal status: INITIAL   2.  Patient will report >/= 61% status on FOTO to indicate improved functional ability. Baseline: 44% functional status 07/08/2022: 61% Goal status: MET   3.  Patient will demonstrate bilateral cervical rotation >/= 60 deg without increase in pain in order to improve driving. Baseline: cervical rotation 45  deg bilaterally Goal status: INITIAL   4.  Patient will demonstrate DNF endurance >/= 20 seconds to improve postural control and reduce pain with golf. Baseline: DNF endurance 6 seconds Goal status: INITIAL     PLAN: PT FREQUENCY: 1x/week   PT DURATION: 8  weeks   PLANNED INTERVENTIONS: Therapeutic exercises, Therapeutic activity, Neuromuscular re-education, Balance training, Gait training, Patient/Family education, Self Care, Joint mobilization, Joint manipulation, Aquatic Therapy, Dry Needling, Spinal manipulation, Spinal mobilization, Cryotherapy, Moist heat, Taping, Manual therapy, and Re-evaluation   PLAN FOR NEXT SESSION: Review HEP and progress PRN, manual/dry needling for bilateral upper trap and suboccipitals, neck stretches, SNAG, DNF endurance and postural strengthening    Hilda Blades, PT, DPT, LAT, ATC 07/08/22  4:27 PM Phone: [phone] Fax: [phone]

## 2022-07-08 ENCOUNTER — Ambulatory Visit: Payer: Medicare Other | Attending: Nurse Practitioner | Admitting: Physical Therapy

## 2022-07-08 ENCOUNTER — Encounter: Payer: Self-pay | Admitting: Physical Therapy

## 2022-07-08 ENCOUNTER — Other Ambulatory Visit: Payer: Self-pay

## 2022-07-08 DIAGNOSIS — M6281 Muscle weakness (generalized): Secondary | ICD-10-CM | POA: Insufficient documentation

## 2022-07-08 DIAGNOSIS — R293 Abnormal posture: Secondary | ICD-10-CM | POA: Insufficient documentation

## 2022-07-08 DIAGNOSIS — M542 Cervicalgia: Secondary | ICD-10-CM | POA: Diagnosis not present

## 2022-07-15 ENCOUNTER — Encounter: Payer: Self-pay | Admitting: Physical Therapy

## 2022-07-15 ENCOUNTER — Other Ambulatory Visit: Payer: Self-pay

## 2022-07-15 ENCOUNTER — Ambulatory Visit: Payer: Medicare Other | Admitting: Physical Therapy

## 2022-07-15 DIAGNOSIS — M542 Cervicalgia: Secondary | ICD-10-CM | POA: Diagnosis not present

## 2022-07-15 DIAGNOSIS — M6281 Muscle weakness (generalized): Secondary | ICD-10-CM | POA: Diagnosis not present

## 2022-07-15 DIAGNOSIS — R293 Abnormal posture: Secondary | ICD-10-CM

## 2022-07-15 NOTE — Therapy (Signed)
OUTPATIENT PHYSICAL THERAPY TREATMENT NOTE   Patient Name: Patricia Cantu MRN: [id number] DOB:23-Mar-1941, 81 y.o., female Today's Date: 07/15/2022  PCP: [name], NP REFERRING PROVIDER: [name], NP   END OF SESSION:   PT End of Session - 07/15/22 1519     Visit Number 7    Number of Visits 9    Date for PT Re-Evaluation 07/27/22    Authorization Type [employer] MEDICARE    Authorization Time Period FOTO by 10th    Progress Note Due on Visit 10    PT Start Time 1530    PT Stop Time 1610    PT Time Calculation (min) 40 min    Activity Tolerance Patient tolerated treatment well    Behavior During Therapy [facility] for tasks assessed/performed                 Past Medical History:  Diagnosis Date   Acute perichondritis of pinna    Allergic rhinitis due to pollen    Disorder of bone and cartilage, unspecified    Encounter for long-term (current) use of other medications    History of echocardiogram    Echo 9/18: EF 65-70   History of exercise stress test 06/2017   ETT 9/18: no ischemia; hypertensive BP response   HLD (hyperlipidemia)    Osteoarthritis    Panic disorder    Unspecified dermatitis due to sun    Unspecified hypothyroidism    Past Surgical History:  Procedure Laterality Date   BREAST BIOPSY Right 07/14/2016   BREAST BIOPSY Right 07/15/2016   cataract surgery Bilateral 10/12/2016   childbirth     x2 "blocks"   COLONOSCOPY     x2   TONSILLECTOMY  1949   removed   Patient Active Problem List   Diagnosis Date Noted   Chronic pain of both knees 08/25/2018   Generalized anxiety disorder with panic attacks 01/27/2018   Overweight (BMI 25.0-29.9) 01/27/2018   Family history of MI (myocardial infarction) 04/12/2017   Insomnia 11/23/2013   Premature atrial complexes 02/20/2013   Osteoarthritis of both knees 01/02/2013   Hyperlipidemia with target LDL less than 100 01/02/2013   Vitamin D deficiency 01/02/2013   Hypothyroidism 01/02/2013    Depressive disorder, not elsewhere classified 01/02/2013    REFERRING DIAG: Neck pain  THERAPY DIAG:  Cervicalgia  Muscle weakness (generalized)  Abnormal posture  Rationale for Evaluation and Treatment Rehabilitation  PERTINENT HISTORY: Depression/anxiety  PRECAUTIONS: None    SUBJECTIVE: Patient reports she is feel good. She feels she is still making progress.   PAIN:  Are you having pain? Yes:  NPRS scale: 1/10 Pain location: Left neck and upper trap region Pain description: poking Aggravating factors: head position Relieving factors: golf   OBJECTIVE: (objective measures completed at initial evaluation unless otherwise dated) PATIENT SURVEYS:  FOTO 44% functional status  07/08/2022: 61%   POSTURE:  Forward head posture, rounded shoulders   PALPATION: Tender to palpation bilateral upper trap, suboccipitals     CERVICAL ROM:    Active ROM A/PROM (deg) eval 06/11/22 06/22/22 07/01/2022 07/08/2022  Flexion 55  60    Extension 45  55    Right lateral flexion 10      Left lateral flexion 10      Right rotation 45 55  55 60  Left rotation 45 46 45 50 60  Patient reports left rotation "harder" than right   UPPER EXTREMITY ROM:  UE AROM grossly WFL   UPPER EXTREMITY MMT:   MMT Right eval Left eval  Shoulder flexion 5 5  Shoulder extension 4 4  Shoulder abduction 5 5  Shoulder internal rotation 5 5  Shoulder external rotation 5 4  Middle trapezius 4- 4-  Lower trapezius 4- 4-    CERVICAL SPECIAL TESTS:  Radicular testing negative   FUNCTIONAL TESTS:  DNF endurance 6 seconds  07/15/2022: 13 seconds     TODAY'S TREATMENT:  [facility] Adult PT Treatment:                                                DATE: 07/15/22 Therapeutic Exercise: UBE L3 x 4 min (2 fwd/bwd) while taking subjective Sidelying thoracic rotation x 10 each Seated thoracic extension with hands grasped behind  head x 10 Corner pec stretch 3 x 20 sec Row with black  in staggered stance 2 x 20 Standing double ER and scap retraction with blue 2 x 20 Standing chin tuck with ball at wall x 10 Seated banded chin tuck with blue x 10 Seated chin tuck with finger assist at chin 2 x 10 Supine horizontal abduction 2 x 15   [facility] Adult PT Treatment:                                                DATE: 07/08/22 Therapeutic Exercise: UBE L3 x 5 min (2.5 fwd/bwd) while taking subjective Cervical rotation SNAG with towel 2 x 10 to left Doorway pec stretch at 60 deg 3 x 20 sec Sidelying thoracic rotation x 10 each Manual: Skilled palpation and monitoring of muscle tension while performing TPDN STM and TPR for left upper trap region Suboccipital release with gentle manual traction x 3 bouts Supine cervical side glide mobs to left Trigger Point Dry Needling Treatment: Pre-treatment instruction: Patient instructed on dry needling rationale, procedures, and possible side effects including pain during treatment (achy,cramping feeling), bruising, drop of blood, lightheadedness, nausea, sweating. Patient Consent Given: Yes Education handout provided: Previously provided Muscles treated: Left upper trap, splenius cervicis, cervical multifidi  Needle size and number: .30x55m x 4 Electrical stimulation performed: No Parameters: N/A Treatment response/outcome: Twitch response elicited and Palpable decrease in muscle tension Post-treatment instructions: Patient instructed to expect possible mild to moderate muscle soreness later today and/or tomorrow. Patient instructed in methods to reduce muscle soreness and to continue prescribed HEP. If patient was dry needled over the lung field, patient was instructed on signs and symptoms of pneumothorax and, however unlikely, to see immediate medical attention should they occur. Patient was also educated on signs and symptoms of infection and to seek medical attention should they occur. Patient verbalized understanding of these  instructions and education.  [facility]                                                [facility]: [facility] Therapeutic Exercise: UBE L3 x 4 min (2 fwd/bwd) while taking subjective Cervical rotation SNAG with towel 2 x 10 to left Doorway pec stretch at 60 deg 3 x 20 sec  Sidelying thoracic rotation x 10 each Seated chin tuck x 10 Manual: Skilled palpation and monitoring of muscle tension while performing TPDN STM and TPR for left upper trap region Trigger Point Dry Needling Treatment: Pre-treatment instruction: Patient instructed on dry needling rationale, procedures, and possible side effects including pain during treatment (achy,cramping feeling), bruising, drop of blood, lightheadedness, nausea, sweating. Patient Consent Given: Yes Education handout provided: Previously provided Muscles treated: Left upper trap  Needle size and number: .30x69m x 4 Electrical stimulation performed: No Parameters: N/A Treatment response/outcome: Twitch response elicited and Palpable decrease in muscle tension Post-treatment instructions: Patient instructed to expect possible mild to moderate muscle soreness later today and/or tomorrow. Patient instructed in methods to reduce muscle soreness and to continue prescribed HEP. If patient was dry needled over the lung field, patient was instructed on signs and symptoms of pneumothorax and, however unlikely, to see immediate medical attention should they occur. Patient was also educated on signs and symptoms of infection and to seek medical attention should they occur. Patient verbalized understanding of these instructions and education.   PATIENT EDUCATION:  Education details: HEP update Person educated: Patient Education method: Explanation, Demonstration, Tactile cues, Verbal cues, Handout Education comprehension: verbalized understanding, returned demonstration, verbal cues required, tactile cues required, and needs further education   HOME EXERCISE  PROGRAM: Access Code: [email or web address]    ASSESSMENT: CLINICAL IMPRESSION: Patient tolerated therapy well with no adverse effects. Therapy focused on therex progressing her spinal mobility and postural strengthening. She reports continued improvement in symptoms and is progressing well with strengthening exercises. Trialed multiple methods for performing chin tuck and she had difficulty with resistance so performed using finger assist on chin with improved technique. Her DNF endurance has improved since evaluation but remains limited. Updated her HEP to progress mobility and strength. No pain reported with therapy. Patient would benefit from continued skilled PT to progress her mobility and strength in order to reduce pain and maximize functional ability.     OBJECTIVE IMPAIRMENTS decreased ROM, decreased strength, impaired flexibility, postural dysfunction, and pain.    ACTIVITY LIMITATIONS lifting, sitting, standing, sleeping, and hygiene/grooming   PARTICIPATION LIMITATIONS: meal prep, cleaning, driving, and golf   PERSONAL FACTORS Age, Fitness, and Time since onset of injury/illness/exacerbation are also affecting patient's functional outcome.      GOALS: Goals reviewed with patient? Yes   SHORT TERM GOALS: Target date: 06/29/2022    Patient will be I with initial HEP in order to progress with therapy. Baseline: HEP provided at eval Status: 06/22/22: cues for cervical retraction  Goal status: ongoing    2.  PT will review FOTO with patient by 3rd visit in order to understand expected progress and outcome with therapy. Baseline: FOTO assessed at eval Goal status: INITIAL   3.  Patient will report pain level </= 3/10 with driving or household tasks in order to reduce functional limitations. Baseline: 7/10 pain Status: 06/22/22: 3-4/10  Goal status: ongoing    LONG TERM GOALS: Target date: 07/27/2022   Patient will be I with final HEP to maintain progress from PT. Baseline: HEP  provided at eval Goal status: INITIAL   2.  Patient will report >/= 61% status on FOTO to indicate improved functional ability. Baseline: 44% functional status 07/08/2022: 61% Goal status: MET   3.  Patient will demonstrate bilateral cervical rotation >/= 60 deg without increase in pain in order to improve driving. Baseline: cervical rotation 45 deg bilaterally Goal status: INITIAL   4.  Patient  will demonstrate DNF endurance >/= 20 seconds to improve postural control and reduce pain with golf. Baseline: DNF endurance 6 seconds Goal status: INITIAL     PLAN: PT FREQUENCY: 1x/week   PT DURATION: 8 weeks   PLANNED INTERVENTIONS: Therapeutic exercises, Therapeutic activity, Neuromuscular re-education, Balance training, Gait training, Patient/Family education, Self Care, Joint mobilization, Joint manipulation, Aquatic Therapy, Dry Needling, Spinal manipulation, Spinal mobilization, Cryotherapy, Moist heat, Taping, Manual therapy, and Re-evaluation   PLAN FOR NEXT SESSION: Review HEP and progress PRN, manual/dry needling for bilateral upper trap and suboccipitals, neck stretches, SNAG, DNF endurance and postural strengthening    Hilda Blades, PT, DPT, LAT, ATC 07/15/22  4:22 PM Phone: [phone] Fax: [phone]

## 2022-07-15 NOTE — Patient Instructions (Signed)
Access Code: [email or web address] URL: [email or web address] Date: 07/15/2022 Prepared by: Hilda Blades  Exercises - Sidelying Thoracic Lumbar Rotation  - 1-2 x daily - 10 reps - Seated Cervical Sidebending Stretch  - 1-2 x daily - 3 reps - 20 seconds hold - Seated Passive Cervical Retraction  - 1-2 x daily - 2 sets - 10 reps - 5 seconds hold - Seated Assisted Cervical Rotation with Towel  - 1-2 x daily - 2 sets - 10 reps - Corner Pec Major Stretch  - 1-2 x daily - 3 reps - 20 seconds hold - Split Stance Shoulder Row with Resistance  - 1-2 x daily - 2 sets - 20 reps

## 2022-07-21 NOTE — Therapy (Signed)
OUTPATIENT PHYSICAL THERAPY TREATMENT NOTE   Patient Name: Patricia Cantu MRN: [id number] DOB:05/26/1941, 81 y.o., female Today's Date: 07/22/2022  PCP: [name], NP REFERRING PROVIDER: [name], NP   END OF SESSION:   PT End of Session - 07/22/22 1537     Visit Number 8    Number of Visits 9    Date for PT Re-Evaluation 07/27/22    Authorization Type [employer] MEDICARE    Authorization Time Period FOTO by 10th    Progress Note Due on Visit 10    PT Start Time 1535    PT Stop Time 1605    PT Time Calculation (min) 30 min    Activity Tolerance Patient tolerated treatment well    Behavior During Therapy [employer] for tasks assessed/performed                  Past Medical History:  Diagnosis Date   Acute perichondritis of pinna    Allergic rhinitis due to pollen    Disorder of bone and cartilage, unspecified    Encounter for long-term (current) use of other medications    History of echocardiogram    Echo 9/18: EF 65-70   History of exercise stress test 06/2017   ETT 9/18: no ischemia; hypertensive BP response   HLD (hyperlipidemia)    Osteoarthritis    Panic disorder    Unspecified dermatitis due to sun    Unspecified hypothyroidism    Past Surgical History:  Procedure Laterality Date   BREAST BIOPSY Right 07/14/2016   BREAST BIOPSY Right 07/15/2016   cataract surgery Bilateral 10/12/2016   childbirth     x2 "blocks"   COLONOSCOPY     x2   TONSILLECTOMY  1949   removed   Patient Active Problem List   Diagnosis Date Noted   Chronic pain of both knees 08/25/2018   Generalized anxiety disorder with panic attacks 01/27/2018   Overweight (BMI 25.0-29.9) 01/27/2018   Family history of MI (myocardial infarction) 04/12/2017   Insomnia 11/23/2013   Premature atrial complexes 02/20/2013   Osteoarthritis of both knees 01/02/2013   Hyperlipidemia with target LDL less than 100 01/02/2013   Vitamin D deficiency 01/02/2013   Hypothyroidism  01/02/2013   Depressive disorder, not elsewhere classified 01/02/2013    REFERRING DIAG: Neck pain  THERAPY DIAG:  Cervicalgia  Muscle weakness (generalized)  Abnormal posture  Rationale for Evaluation and Treatment Rehabilitation  PERTINENT HISTORY: Depression/anxiety  PRECAUTIONS: None    SUBJECTIVE: Patient reports she is doing well. States she isn't really having pain but does not it is harder for her to turn her head to the left. She feels that golf has been helping with her neck.  PAIN:  Are you having pain? Yes:  NPRS scale: 1/10 Pain location: Left neck and upper trap region Pain description: poking Aggravating factors: head position Relieving factors: golf   OBJECTIVE: (objective measures completed at initial evaluation unless otherwise dated) PATIENT SURVEYS:  FOTO 44% functional status  07/08/2022: 61%   POSTURE:  Forward head posture, rounded shoulders   PALPATION: Tender to palpation bilateral upper trap, suboccipitals     CERVICAL ROM:    Active ROM A/PROM (deg) eval 06/11/22 06/22/22 07/01/2022 07/08/2022 07/22/2022  Flexion 55  60     Extension 45  55     Right lateral flexion 10       Left lateral flexion 10       Right rotation 45 55  55 60 60  Left rotation 45 46 45 50 60 60  Patient reports left rotation "harder" than right   UPPER EXTREMITY ROM:                       UE AROM grossly WFL   UPPER EXTREMITY MMT:   MMT Right eval Left eval  Shoulder flexion 5 5  Shoulder extension 4 4  Shoulder abduction 5 5  Shoulder internal rotation 5 5  Shoulder external rotation 5 4  Middle trapezius 4- 4-  Lower trapezius 4- 4-    CERVICAL SPECIAL TESTS:  Radicular testing negative   FUNCTIONAL TESTS:  DNF endurance 6 seconds  07/15/2022: 13 seconds     TODAY'S TREATMENT:  [facility] Adult PT Treatment:                                                DATE: 07/22/22 Therapeutic Exercise: UBE L3 x 4 min (2 fwd/bwd) while taking  subjective Seated thoracic extension with hands grasped behind head x 10 Seated thoracic rotation with dowel across chest x 10 each Cervical rotation SNAG with towel x 10 each Seated chin tuck with finger assist at chin x 10 Standing double ER and scap retraction with blue x 20 Corner pec stretch 3 x 20 sec Row with green 2 x 20 Extension with green x 20 Supine horizontal abduction 2 x 15   [facility] Adult PT Treatment:                                                DATE: 07/15/22 Therapeutic Exercise: UBE L3 x 4 min (2 fwd/bwd) while taking subjective Sidelying thoracic rotation x 10 each Seated thoracic extension with hands grasped behind  head x 10 Corner pec stretch 3 x 20 sec Row with black in staggered stance 2 x 20 Standing double ER and scap retraction with blue 2 x 20 Standing chin tuck with ball at wall x 10 Seated banded chin tuck with blue x 10 Seated chin tuck with finger assist at chin 2 x 10 Supine horizontal abduction 2 x 15  [facility] Adult PT Treatment:                                                DATE: 07/08/22 Therapeutic Exercise: UBE L3 x 5 min (2.5 fwd/bwd) while taking subjective Cervical rotation SNAG with towel 2 x 10 to left Doorway pec stretch at 60 deg 3 x 20 sec Sidelying thoracic rotation x 10 each Manual: Skilled palpation and monitoring of muscle tension while performing TPDN STM and TPR for left upper trap region Suboccipital release with gentle manual traction x 3 bouts Supine cervical side glide mobs to left Trigger Point Dry Needling Treatment: Pre-treatment instruction: Patient instructed on dry needling rationale, procedures, and possible side effects including pain during treatment (achy,cramping feeling), bruising, drop of blood, lightheadedness, nausea, sweating. Patient Consent Given: Yes Education handout provided: Previously provided Muscles treated: Left upper trap, splenius cervicis, cervical multifidi  Needle size and number: .30x81m x  4 Electrical stimulation performed: No Parameters: N/A  Treatment response/outcome: Twitch response elicited and Palpable decrease in muscle tension Post-treatment instructions: Patient instructed to expect possible mild to moderate muscle soreness later today and/or tomorrow. Patient instructed in methods to reduce muscle soreness and to continue prescribed HEP. If patient was dry needled over the lung field, patient was instructed on signs and symptoms of pneumothorax and, however unlikely, to see immediate medical attention should they occur. Patient was also educated on signs and symptoms of infection and to seek medical attention should they occur. Patient verbalized understanding of these instructions and education.   PATIENT EDUCATION:  Education details: POC discharge, HEP Person educated: Patient Education method: Explanation, Demonstration, Tactile cues, Verbal cues Education comprehension: verbalized understanding, returned demonstration, verbal cues required, tactile cues required, and needs further education   HOME EXERCISE PROGRAM: Access Code: [email or web address]     ASSESSMENT: CLINICAL IMPRESSION: Patient tolerated therapy well with no adverse effects. She has demonstrated improved postural control and cervical motion, and reports improved functional level on FOTO. She has achieved all established goals so will be discharged to independent HEP.     OBJECTIVE IMPAIRMENTS decreased ROM, decreased strength, impaired flexibility, postural dysfunction, and pain.    ACTIVITY LIMITATIONS lifting, sitting, standing, sleeping, and hygiene/grooming   PARTICIPATION LIMITATIONS: meal prep, cleaning, driving, and golf   PERSONAL FACTORS Age, Fitness, and Time since onset of injury/illness/exacerbation are also affecting patient's functional outcome.      GOALS: Goals reviewed with patient? Yes   SHORT TERM GOALS: Target date: 06/29/2022    Patient will be I with initial HEP in order to  progress with therapy. Baseline: HEP provided at eval Status: 06/22/22: cues for cervical retraction  07/22/2022: independent Goal status: MET   2.  PT will review FOTO with patient by 3rd visit in order to understand expected progress and outcome with therapy. Baseline: FOTO assessed at eval Goal status: MET  3.  Patient will report pain level </= 3/10 with driving or household tasks in order to reduce functional limitations. Baseline: 7/10 pain Status: 06/22/22: 3-4/10  07/22/2022: denies pain Goal status: MET   LONG TERM GOALS: Target date: 07/27/2022   Patient will be I with final HEP to maintain progress from PT. Baseline: HEP provided at eval 07/22/2022: independent Goal status: MET   2.  Patient will report >/= 61% status on FOTO to indicate improved functional ability. Baseline: 44% functional status 07/08/2022: 61% Goal status: MET   3.  Patient will demonstrate bilateral cervical rotation >/= 60 deg without increase in pain in order to improve driving. Baseline: cervical rotation 45 deg bilaterally 07/22/2022: 60 deg Goal status: MET   4.  Patient will demonstrate DNF endurance >/= 20 seconds to improve postural control and reduce pain with golf. Baseline: DNF endurance 6 seconds 07/22/2022: 25 seconds Goal status: MET     PLAN: PT FREQUENCY: 1x/week   PT DURATION: 8 weeks   PLANNED INTERVENTIONS: Therapeutic exercises, Therapeutic activity, Neuromuscular re-education, Balance training, Gait training, Patient/Family education, Self Care, Joint mobilization, Joint manipulation, Aquatic Therapy, Dry Needling, Spinal manipulation, Spinal mobilization, Cryotherapy, Moist heat, Taping, Manual therapy, and Re-evaluation   PLAN FOR NEXT SESSION: NA - discharge    Hilda Blades, PT, DPT, LAT, ATC 07/22/22  4:48 PM Phone: [phone] Fax: [phone]   PHYSICAL THERAPY DISCHARGE SUMMARY  Visits from Start of Care: 8  Current functional level related to  goals / functional outcomes: See above   Remaining deficits: See above   Education / Equipment: HEP   Patient  agrees to discharge. Patient goals were met. Patient is being discharged due to meeting the stated rehab goals.

## 2022-07-22 ENCOUNTER — Other Ambulatory Visit: Payer: Self-pay

## 2022-07-22 ENCOUNTER — Ambulatory Visit: Payer: Medicare Other | Admitting: Physical Therapy

## 2022-07-22 ENCOUNTER — Encounter: Payer: Self-pay | Admitting: Physical Therapy

## 2022-07-22 DIAGNOSIS — R293 Abnormal posture: Secondary | ICD-10-CM

## 2022-07-22 DIAGNOSIS — M6281 Muscle weakness (generalized): Secondary | ICD-10-CM

## 2022-07-22 DIAGNOSIS — M542 Cervicalgia: Secondary | ICD-10-CM

## 2022-08-02 ENCOUNTER — Encounter: Payer: Self-pay | Admitting: Nurse Practitioner

## 2022-08-03 NOTE — Telephone Encounter (Signed)
Message routed to PCP Eubanks, Jessica K, NP  

## 2022-08-18 ENCOUNTER — Encounter: Payer: Medicare Other | Admitting: Nurse Practitioner

## 2022-09-07 ENCOUNTER — Encounter: Payer: Self-pay | Admitting: Nurse Practitioner

## 2022-09-07 ENCOUNTER — Ambulatory Visit (INDEPENDENT_AMBULATORY_CARE_PROVIDER_SITE_OTHER): Payer: Medicare Other | Admitting: Nurse Practitioner

## 2022-09-07 VITALS — Ht 65.0 in | Wt 192.0 lb

## 2022-09-07 DIAGNOSIS — Z0000 Encounter for general adult medical examination without abnormal findings: Secondary | ICD-10-CM

## 2022-09-07 NOTE — Patient Instructions (Signed)
Patricia Cantu , Thank you for taking time to come for your Medicare Wellness Visit. I appreciate your ongoing commitment to your health goals. Please review the following plan we discussed and let me know if I can assist you in the future.   Screening recommendations/referrals: Colonoscopy aged out Mammogram aged out Bone Density up to date Recommended yearly ophthalmology/optometry visit for glaucoma screening and checkup Recommended yearly dental visit for hygiene and checkup  Vaccinations: Influenza vaccine- due annually in September/October Pneumococcal vaccine up to date Tdap vaccine up to date Shingles vaccine up to date    Advanced directives: on file  Conditions/risks identified: advanced age, hyperlipidemia, hypertension   Next appointment: yearly   Preventive Care 80 Years and Older, Female Preventive care refers to lifestyle choices and visits with your health care provider that can promote health and wellness. What does preventive care include? A yearly physical exam. This is also called an annual well check. Dental exams once or twice a year. Routine eye exams. Ask your health care provider how often you should have your eyes checked. Personal lifestyle choices, including: Daily care of your teeth and gums. Regular physical activity. Eating a healthy diet. Avoiding tobacco and drug use. Limiting alcohol use. Practicing safe sex. Taking low-dose aspirin every day. Taking vitamin and mineral supplements as recommended by your health care provider. What happens during an annual well check? The services and screenings done by your health care provider during your annual well check will depend on your age, overall health, lifestyle risk factors, and family history of disease. Counseling  Your health care provider may ask you questions about your: Alcohol use. Tobacco use. Drug use. Emotional well-being. Home and relationship well-being. Sexual activity. Eating  habits. History of falls. Memory and ability to understand (cognition). Work and work Statistician. Reproductive health. Screening  You may have the following tests or measurements: Height, weight, and BMI. Blood pressure. Lipid and cholesterol levels. These may be checked every 5 years, or more frequently if you are over 39 years old. Skin check. Lung cancer screening. You may have this screening every year starting at age 21 if you have a 30-pack-year history of smoking and currently smoke or have quit within the past 15 years. Fecal occult blood test (FOBT) of the stool. You may have this test every year starting at age 26. Flexible sigmoidoscopy or colonoscopy. You may have a sigmoidoscopy every 5 years or a colonoscopy every 10 years starting at age 80. Hepatitis C blood test. Hepatitis B blood test. Sexually transmitted disease (STD) testing. Diabetes screening. This is done by checking your blood sugar (glucose) after you have not eaten for a while (fasting). You may have this done every 1-3 years. Bone density scan. This is done to screen for osteoporosis. You may have this done starting at age 80 Mammogram. This may be done every 1-2 years. Talk to your health care provider about how often you should have regular mammograms. Talk with your health care provider about your test results, treatment options, and if necessary, the need for more tests. Vaccines  Your health care provider may recommend certain vaccines, such as: Influenza vaccine. This is recommended every year. Tetanus, diphtheria, and acellular pertussis (Tdap, Td) vaccine. You may need a Td booster every 10 years. Zoster vaccine. You may need this after age 36. Pneumococcal 13-valent conjugate (PCV13) vaccine. One dose is recommended after age 80 Pneumococcal polysaccharide (PPSV23) vaccine. One dose is recommended after age 80 Talk to your health care  provider about which screenings and vaccines you need and how  often you need them. This information is not intended to replace advice given to you by your health care provider. Make sure you discuss any questions you have with your health care provider. Document Released: 10/18/2015 Document Revised: 06/10/2016 Document Reviewed: 07/23/2015 Elsevier Interactive Patient Education  2017 [employer] Prevention in the Home Falls can cause injuries. They can happen to people of all ages. There are many things you can do to make your home safe and to help prevent falls. What can I do on the outside of my home? Regularly fix the edges of walkways and driveways and fix any cracks. Remove anything that might make you trip as you walk through a door, such as a raised step or threshold. Trim any bushes or trees on the path to your home. Use bright outdoor lighting. Clear any walking paths of anything that might make someone trip, such as rocks or tools. Regularly check to see if handrails are loose or broken. Make sure that both sides of any steps have handrails. Any raised decks and porches should have guardrails on the edges. Have any leaves, snow, or ice cleared regularly. Use sand or salt on walking paths during winter. Clean up any spills in your garage right away. This includes oil or grease spills. What can I do in the bathroom? Use night lights. Install grab bars by the toilet and in the tub and shower. Do not use towel bars as grab bars. Use non-skid mats or decals in the tub or shower. If you need to sit down in the shower, use a plastic, non-slip stool. Keep the floor dry. Clean up any water that spills on the floor as soon as it happens. Remove soap buildup in the tub or shower regularly. Attach bath mats securely with double-sided non-slip rug tape. Do not have throw rugs and other things on the floor that can make you trip. What can I do in the bedroom? Use night lights. Make sure that you have a light by your bed that is easy to  reach. Do not use any sheets or blankets that are too big for your bed. They should not hang down onto the floor. Have a firm chair that has side arms. You can use this for support while you get dressed. Do not have throw rugs and other things on the floor that can make you trip. What can I do in the kitchen? Clean up any spills right away. Avoid walking on wet floors. Keep items that you use a lot in easy-to-reach places. If you need to reach something above you, use a strong step stool that has a grab bar. Keep electrical cords out of the way. Do not use floor polish or wax that makes floors slippery. If you must use wax, use non-skid floor wax. Do not have throw rugs and other things on the floor that can make you trip. What can I do with my stairs? Do not leave any items on the stairs. Make sure that there are handrails on both sides of the stairs and use them. Fix handrails that are broken or loose. Make sure that handrails are as long as the stairways. Check any carpeting to make sure that it is firmly attached to the stairs. Fix any carpet that is loose or worn. Avoid having throw rugs at the top or bottom of the stairs. If you do have throw rugs, attach them to the  floor with carpet tape. Make sure that you have a light switch at the top of the stairs and the bottom of the stairs. If you do not have them, ask someone to add them for you. What else can I do to help prevent falls? Wear shoes that: Do not have high heels. Have rubber bottoms. Are comfortable and fit you well. Are closed at the toe. Do not wear sandals. If you use a stepladder: Make sure that it is fully opened. Do not climb a closed stepladder. Make sure that both sides of the stepladder are locked into place. Ask someone to hold it for you, if possible. Clearly mark and make sure that you can see: Any grab bars or handrails. First and last steps. Where the edge of each step is. Use tools that help you move  around (mobility aids) if they are needed. These include: Canes. Walkers. Scooters. Crutches. Turn on the lights when you go into a dark area. Replace any light bulbs as soon as they burn out. Set up your furniture so you have a clear path. Avoid moving your furniture around. If any of your floors are uneven, fix them. If there are any pets around you, be aware of where they are. Review your medicines with your doctor. Some medicines can make you feel dizzy. This can increase your chance of falling. Ask your doctor what other things that you can do to help prevent falls. This information is not intended to replace advice given to you by your health care provider. Make sure you discuss any questions you have with your health care provider. Document Released: 07/18/2009 Document Revised: 02/27/2016 Document Reviewed: 10/26/2014 Elsevier Interactive Patient Education  2017 [employer].

## 2022-09-07 NOTE — Progress Notes (Signed)
Subjective:   Patricia Cantu is a 81 y.o. female who presents for Medicare Annual (Subsequent) preventive examination.  Review of Systems     Cardiac Risk Factors include: advanced age (>30mn, >>16women);dyslipidemia;hypertension     Objective:    Today's Vitals   09/07/22 1449  Weight: 192 lb (87.1 kg)  Height: '5\' 5"'$  (1.651 m)   Body mass index is 31.95 kg/m.     09/07/2022    2:51 PM 06/01/2022    9:44 AM 04/27/2022    8:42 AM 03/16/2022    8:12 AM 10/31/2021    8:06 AM 08/12/2021    3:42 PM 06/19/2021    1:02 PM  Advanced Directives  Does Patient Have a Medical Advance Directive? Yes Yes Yes Yes Yes Yes Yes  Type of Advance Directive [place] Living will Living will Living will  Does patient want to make changes to medical advance directive?   No - Patient declined No - Patient declined No - Patient declined No - Patient declined No - Patient declined  Copy of [place] Chart?   Yes - validated most recent copy scanned in chart (See row information) Yes - validated most recent copy scanned in chart (See row information)       Current Medications (verified) Outpatient Encounter Medications as of 09/07/2022  Medication Sig   atorvastatin (LIPITOR) 10 MG tablet TAKE 1 TABLET BY MOUTH DAILY FOR CHOLESTEROL   CALCIUM PO Take 1,000 mg by mouth daily.    Cholecalciferol (VITAMIN D3) 125 MCG (5000 UT) CAPS Take one capsule by mouth once daily.   citalopram (CELEXA) 20 MG tablet Take 1 tablet (20 mg total) by mouth daily.   diclofenac Sodium (VOLTAREN) 1 % GEL Apply 4 g topically 4 (four) times daily as needed.   famotidine (PEPCID) 10 MG tablet Take 1 tablet (10 mg total) by mouth 2 (two) times daily as needed for heartburn or indigestion.   fish oil-omega-3 fatty acids 1000 MG capsule Take 1 g by mouth daily.    fluticasone (FLONASE)  50 MCG/ACT nasal spray Place 1 spray into both nostrils daily.   levothyroxine (SYNTHROID) 50 MCG tablet TAKE 1 TABLET BY MOUTH ONCE DAILY 30 MINUTES PRIOR TO BREAKFAST FOR THYROID   Multiple Vitamin (MULTIVITAMIN) tablet Take 1 tablet by mouth daily.   nystatin cream (MYCOSTATIN) Apply 1 application topically 2 (two) times daily as needed.   triamcinolone cream (KENALOG) 0.1 % Apply 1 application topically daily as needed (itching).    ALPRAZolam (XANAX) 0.25 MG tablet Take 1 tablet (0.25 mg total) by mouth daily as needed (panic attacks). (Patient not taking: Reported on 09/07/2022)   No facility-administered encounter medications on file as of 09/07/2022.    Allergies (verified) Shellfish allergy   History: Past Medical History:  Diagnosis Date   Acute perichondritis of pinna    Allergic rhinitis due to pollen    Disorder of bone and cartilage, unspecified    Encounter for long-term (current) use of other medications    History of echocardiogram    Echo 9/18: EF 65-70   History of exercise stress test 06/2017   ETT 9/18: no ischemia; hypertensive BP response   HLD (hyperlipidemia)    Osteoarthritis    Panic disorder    Unspecified dermatitis due to sun    Unspecified hypothyroidism    Past Surgical History:  Procedure Laterality Date  BREAST BIOPSY Right 07/14/2016   BREAST BIOPSY Right 07/15/2016   cataract surgery Bilateral 10/12/2016   childbirth     x2 "blocks"   COLONOSCOPY     x2   TONSILLECTOMY  1949   removed   Family History  Problem Relation Age of Onset   Stroke Mother    Heart failure Mother 62   Hypertension Mother    Heart attack Father 34   Hyperlipidemia Sister    Atrial fibrillation Sister    Thyroid disease Sister    Heart disease Son    Thyroid disease Son    Thyroid disease Daughter    Atrial fibrillation Brother    Breast cancer Maternal Grandmother 92   Colon cancer Neg Hx    Social History   Socioeconomic History   Marital status:  Married    Spouse name: Not on file   Number of children: Not on file   Years of education: Not on file   Highest education level: Not on file  Occupational History   Occupation: retired  Tobacco Use   Smoking status: Never   Smokeless tobacco: Never  Vaping Use   Vaping Use: Never used  Substance and Sexual Activity   Alcohol use: Yes    Alcohol/week: 2.0 standard drinks of alcohol    Types: 2 Glasses of wine per week   Drug use: No   Sexual activity: Yes  Other Topics Concern   Not on file  Social History Narrative   She is retired. 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 She is married and has 3 children and 6 grandchildren. She is originally from [place]. She has lived in [place]. She lived in [place] for 7 years. She denies tobacco abuse. She drinks alcohol on occasion.   Social Determinants of Health   Financial Resource Strain: Low Risk  (08/01/2018)   Overall Financial Resource Strain (CARDIA)    Difficulty of Paying Living Expenses: Not hard at all  Food Insecurity: No Food Insecurity (08/01/2018)   Hunger Vital Sign    Worried About Running Out of Food in the Last Year: Never true    Ran Out of Food in the Last Year: Never true  Transportation Needs: No Transportation Needs (08/01/2018)   PRAPARE - Hydrologist (Medical): No    Lack of Transportation (Non-Medical): No  Physical Activity: Sufficiently Active (08/01/2018)   Exercise Vital Sign    Days of Exercise per Week: 5 days    Minutes of Exercise per Session: 30 min  Stress: No Stress Concern Present (08/01/2018)   [place]    Feeling of Stress : Not at all  Social Connections: [place] (08/01/2018)   Social Connection and Isolation Panel [NHANES]    Frequency of Communication with Friends and Family: More than three times a week     Frequency of Social Gatherings with Friends and Family: More than three times a week    Attends Religious Services: More than 4 times per year    Active Member of [employer] or Organizations: Yes    Attends Music therapist: More than 4 times per year    Marital Status: Married    Tobacco Counseling Counseling given: Not Answered   Clinical Intake:  Pre-visit preparation completed: Yes  Pain : No/denies pain     BMI - recorded: 31 Nutritional Status:  BMI > 30  Obese  How often do you need to have someone help you when you read instructions, pamphlets, or other written materials from your doctor or pharmacy?: 1 - Never  [place] of Daily Living    09/07/2022    3:13 PM 09/07/2022    2:52 PM  In your present state of health, do you have any difficulty performing the following activities:  Hearing?  0  Vision?  0  Difficulty concentrating or making decisions?  0  Walking or climbing stairs?  0  Dressing or bathing?  0  Doing errands, shopping?  0  Preparing Food and eating ? N Y  Using the Toilet? N Y  In the past six months, have you accidently leaked urine? N Y  Do you have problems with loss of bowel control? N N  Managing your Medications? N Y  Managing your Finances? N Y  Housekeeping or managing your Housekeeping? N Y    Patient Care Team: [name], NP as PCP - General (Geriatric Medicine)  Indicate any recent Medical Services you may have received from other than [facility] providers in the past year (date may be approximate).     Assessment:   This is a routine wellness examination for Patricia Cantu.  Hearing/Vision screen No results found.  Dietary issues and exercise activities discussed: Current Exercise Habits: Structured exercise class, Type of exercise: strength training/weights, Time (Minutes): 30, Frequency (Times/Week): 3, Weekly Exercise (Minutes/Week): 90, Intensity: Moderate, Exercise limited by: None  identified   Goals Addressed   None    Depression Screen    09/07/2022    2:52 PM 10/31/2021    8:06 AM 08/12/2021    3:38 PM 09/19/2020    9:10 AM 08/09/2020    1:07 PM 03/21/2020    9:16 AM 09/21/2019    9:01 AM  PHQ 2/9 Scores  PHQ - 2 Score 0 0 0 0 0 0 0    Fall Risk    09/07/2022    2:52 PM 03/16/2022    8:11 AM 10/31/2021    8:05 AM 08/12/2021    3:40 PM 06/19/2021    1:02 PM  [place] in the past year? 0 0 0 0 0  Number falls in past yr:  0 0 0 0  Injury with Fall?  0 0 0   Risk for fall due to :  No Fall Risks No Fall Risks No Fall Risks   Follow up Falls evaluation completed Falls evaluation completed Falls evaluation completed Falls evaluation completed Falls evaluation completed    [place]:  Any stairs in or around the home? Yes  If so, are there any without handrails? No  Home free of loose throw rugs in walkways, pet beds, electrical cords, etc? Yes  Adequate lighting in your home to reduce risk of falls? Yes   ASSISTIVE DEVICES UTILIZED TO PREVENT FALLS:  Life alert? No  Use of a cane, walker or w/c? No  Grab bars in the bathroom? No  Shower chair or bench in shower? No  Elevated toilet seat or a handicapped toilet? No   TIMED UP AND GO:  Was the test performed? No .    Cognitive Function:    08/08/2019    2:26 PM 08/01/2018    9:40 AM 12/17/2016    9:20 AM 11/28/2015    1:48 PM  MMSE - Mini Mental  State Exam  Orientation to time '5 5 5 5  '$ Orientation to Place '5 5 5 5  '$ Registration '3 3 3 3  '$ Attention/ Calculation '5 5 5 5  '$ Recall '3 3 3 2  '$ Language- name 2 objects '2 2 2 2  '$ Language- repeat '1 1 1 1  '$ Language- follow 3 step command '3 3 3 3  '$ Language- read & follow direction '1 1 1 1  '$ Write a sentence '1 1 1 1  '$ Copy design '1 1 1 1  '$ Total score '30 30 30 29        '$ 09/07/2022    2:55 PM 08/12/2021    3:43 PM 08/09/2020    1:10 PM  6CIT Screen  What Year? 0 points 0 points 0 points  What month? 0 points  0 points 0 points  What time? 0 points 0 points 0 points  Count back from 20 0 points 0 points 2 points  Months in reverse 0 points 0 points 0 points  Repeat phrase 0 points 0 points 0 points  Total Score 0 points 0 points 2 points    Immunizations Immunization History  Administered Date(s) Administered   Fluad Quad(high Dose 65+) 08/08/2019, 06/19/2021   Hepatitis A 02/17/2013   Hepatitis A, Adult 01/18/2014   Hepatitis B 07/22/2006, 08/24/2006, 01/21/2007   Influenza, High Dose Seasonal PF 06/21/2017, 08/01/2018, 08/26/2020, 07/06/2022   Influenza,inj,Quad PF,6+ Mos 06/15/2013, 08/03/2014, 07/26/2015, 05/28/2016   Moderna Covid-19 Vaccine Bivalent Booster 37yr & up 07/12/2022   PFIZER(Purple Top)SARS-COV-2 Vaccination 10/17/2019, 11/06/2019, 07/10/2020, 01/03/2021   Pfizer Covid-19 Vaccine Bivalent Booster 119yr& up 08/08/2021   Pneumococcal Conjugate-13 11/29/2014   Pneumococcal Polysaccharide-23 10/05/2021   Respiratory Syncytial Virus Vaccine,Recomb Aduvanted(Arexvy) 07/20/2022   Td 10/06/2007   Tdap 03/15/2018   Zoster Recombinat (Shingrix) 02/02/2018, 04/25/2018    TDAP status: Up to date  Flu Vaccine status: Up to date  Pneumococcal vaccine status: Up to date  Covid-19 vaccine status: Information provided on how to obtain vaccines.   Qualifies for Shingles Vaccine? Yes   Zostavax completed Yes   Shingrix Completed?: Yes  Screening Tests Health Maintenance  Topic Date Due   COVID-19 Vaccine (7 - 2023-24 season) 09/06/2022   Medicare Annual Wellness (AWV)  09/08/2023   DTaP/Tdap/Td (3 - Td or Tdap) 03/15/2028   Pneumonia Vaccine 80Years old  Completed   INFLUENZA VACCINE  Completed   DEXA SCAN  Completed   Zoster Vaccines- Shingrix  Completed   HPV VACCINES  Aged Out    Health Maintenance  Health Maintenance Due  Topic Date Due   COVID-19 Vaccine (7 - 2023-24 season) 09/06/2022    Colorectal cancer screening: No longer required.   Mammogram  status: No longer required due to age.  Bone Density status: Completed 12/07/20. Results reflect: Bone density results: OSTEOPENIA. Repeat every 2 years.  Lung Cancer Screening: (Low Dose CT Chest recommended if Age 80-80ears, 30 pack-year currently smoking OR have quit w/in 15years.) does not qualify.   Additional Screening:  Hepatitis C Screening: does not qualify  Vision Screening: Recommended annual ophthalmology exams for early detection of glaucoma and other disorders of the eye. Is the patient up to date with their annual eye exam?  Yes  Who is the provider or what is the name of the office in which the patient attends annual eye exams? [facility] If pt is not established with a provider, would they like to be referred to a provider to establish care? No .  Dental Screening: Recommended annual dental exams for proper oral hygiene  Community Resource Referral / Chronic Care Management: CRR required this visit?  No   CCM required this visit?  No      Plan:     I have personally reviewed and noted the following in the patient's chart:   Medical and social history Use of alcohol, tobacco or illicit drugs  Current medications and supplements including opioid prescriptions. Patient is not currently taking opioid prescriptions. Functional ability and status Nutritional status Physical activity Advanced directives List of other physicians Hospitalizations, surgeries, and ER visits in previous 12 months Vitals Screenings to include cognitive, depression, and falls Referrals and appointments  In addition, I have reviewed and discussed with patient certain preventive protocols, quality metrics, and best practice recommendations. A written personalized care plan for preventive services as well as general preventive health recommendations were provided to patient.     [name], NP   09/07/2022    Virtual Visit via Telephone Note  I connected with  patient 09/07/22 at  3:00 PM EST by telephone and verified that I am speaking with the correct person using two identifiers.  Location: Patient: home Provider: [place]   I discussed the limitations, risks, security and privacy concerns of performing an evaluation and management service by telephone and the availability of in person appointments. I also discussed with the patient that there may be a patient responsible charge related to this service. The patient expressed understanding and agreed to proceed.   I discussed the assessment and treatment plan with the patient. The patient was provided an opportunity to ask questions and all were answered. The patient agreed with the plan and demonstrated an understanding of the instructions.   The patient was advised to call back or seek an in-person evaluation if the symptoms worsen or if the condition fails to improve as anticipated.  I provided 16 minutes of non-face-to-face time during this encounter.  Carlos American. Harle Battiest [employer] printed and mailed

## 2022-09-08 DIAGNOSIS — L570 Actinic keratosis: Secondary | ICD-10-CM | POA: Diagnosis not present

## 2022-09-08 DIAGNOSIS — L821 Other seborrheic keratosis: Secondary | ICD-10-CM | POA: Diagnosis not present

## 2022-09-08 DIAGNOSIS — D225 Melanocytic nevi of trunk: Secondary | ICD-10-CM | POA: Diagnosis not present

## 2022-09-08 DIAGNOSIS — L814 Other melanin hyperpigmentation: Secondary | ICD-10-CM | POA: Diagnosis not present

## 2022-09-17 ENCOUNTER — Encounter: Payer: Self-pay | Admitting: Orthopedic Surgery

## 2022-09-17 ENCOUNTER — Ambulatory Visit: Payer: Medicare Other | Admitting: Orthopedic Surgery

## 2022-09-17 VITALS — BP 119/88 | HR 77 | Temp 97.5°F | Resp 18 | Ht 65.0 in | Wt 196.1 lb

## 2022-09-17 DIAGNOSIS — M542 Cervicalgia: Secondary | ICD-10-CM | POA: Diagnosis not present

## 2022-09-17 DIAGNOSIS — J40 Bronchitis, not specified as acute or chronic: Secondary | ICD-10-CM | POA: Diagnosis not present

## 2022-09-17 MED ORDER — TESSALON PERLES 100 MG PO CAPS: 200.0000 mg | ORAL_CAPSULE | Freq: Two times a day (BID) | ORAL | 0 refills | Status: DC | PRN

## 2022-09-17 NOTE — Progress Notes (Signed)
Careteam: Patient Care Team: [name], NP as PCP - General (Geriatric Medicine)  Seen by: Windell Moulding, AGNP-C  PLACE OF SERVICE:  [place] Directive information Does Patient Have a Medical Advance Directive?: Yes, Type of Advance Directive: Living will, Does patient want to make changes to medical advance directive?: No - Patient declined  Allergies  Allergen Reactions   Shellfish Allergy Diarrhea and Nausea And Vomiting    Chief Complaint  Patient presents with   Medical Management of Chronic Issues    Office Visit for hacking cough. Look at patient neck and been having tension in the neck through the head also the ear started in August and was in PT which ended on October. However the cough has gotten better since calling the doctor.     HPI: Patient is a 81 y.o. female seen today for acute visit due to cough and neck pain.   Productive cough x 1 month. Sputum white/tan. Cough more dry at times. She had covid in October 2023. She has taken mucinex and tylenol prn, believes it has helped. Cough keeps her up at night. Talking stimulates cough. Also having acid reflux. She is taking famotidine prn. She does not watch food triggers.   Ongoing neck pain since last summer. She has completed PT. L>R. No radiation. She is not taking anything for pain.   Review of Systems:  Review of Systems  Constitutional:  Negative for chills and fever.  HENT:  Negative for congestion and sore throat.   Eyes:  Negative for blurred vision.  Respiratory:  Positive for cough and sputum production. Negative for shortness of breath and wheezing.   Cardiovascular:  Negative for chest pain and leg swelling.  Gastrointestinal:  Negative for nausea and vomiting.  Genitourinary:  Negative for dysuria.  Musculoskeletal:  Positive for neck pain. Negative for falls and myalgias.  Neurological:  Negative for dizziness, weakness and headaches.  Psychiatric/Behavioral:  Negative for  depression. The patient is not nervous/anxious.     Past Medical History:  Diagnosis Date   Acute perichondritis of pinna    Allergic rhinitis due to pollen    Disorder of bone and cartilage, unspecified    Encounter for long-term (current) use of other medications    History of echocardiogram    Echo 9/18: EF 65-70   History of exercise stress test 06/2017   ETT 9/18: no ischemia; hypertensive BP response   HLD (hyperlipidemia)    Osteoarthritis    Panic disorder    Unspecified dermatitis due to sun    Unspecified hypothyroidism    Past Surgical History:  Procedure Laterality Date   BREAST BIOPSY Right 07/14/2016   BREAST BIOPSY Right 07/15/2016   cataract surgery Bilateral 10/12/2016   childbirth     x2 "blocks"   COLONOSCOPY     x2   TONSILLECTOMY  1949   removed   Social History:   reports that she has never smoked. She has never used smokeless tobacco. She reports current alcohol use of about 2.0 standard drinks of alcohol per week. She reports that she does not use drugs.  Family History  Problem Relation Age of Onset   Stroke Mother    Heart failure Mother 76   Hypertension Mother    Heart attack Father 71   Hyperlipidemia Sister    Atrial fibrillation Sister    Thyroid disease Sister    Heart disease Son    Thyroid disease Son    Thyroid  disease Daughter    Atrial fibrillation Brother    Breast cancer Maternal Grandmother 92   Colon cancer Neg Hx     Medications: Patient's Medications  New Prescriptions   No medications on file  Previous Medications   ATORVASTATIN (LIPITOR) 10 MG TABLET    TAKE 1 TABLET BY MOUTH DAILY FOR CHOLESTEROL   CALCIUM PO    Take 1,000 mg by mouth daily.    CHOLECALCIFEROL (VITAMIN D3) 125 MCG (5000 UT) CAPS    Take one capsule by mouth once daily.   CITALOPRAM (CELEXA) 20 MG TABLET    Take 1 tablet (20 mg total) by mouth daily.   DICLOFENAC SODIUM (VOLTAREN) 1 % GEL    Apply 4 g topically 4 (four) times daily as needed.    FAMOTIDINE (PEPCID) 10 MG TABLET    Take 1 tablet (10 mg total) by mouth 2 (two) times daily as needed for heartburn or indigestion.   FISH OIL-OMEGA-3 FATTY ACIDS 1000 MG CAPSULE    Take 1 g by mouth daily.    FLUTICASONE (FLONASE) 50 MCG/ACT NASAL SPRAY    Place 1 spray into both nostrils daily.   LEVOTHYROXINE (SYNTHROID) 50 MCG TABLET    TAKE 1 TABLET BY MOUTH ONCE DAILY 30 MINUTES PRIOR TO BREAKFAST FOR THYROID   MULTIPLE VITAMIN (MULTIVITAMIN) TABLET    Take 1 tablet by mouth daily.   NYSTATIN CREAM (MYCOSTATIN)    Apply 1 application topically 2 (two) times daily as needed.   TRIAMCINOLONE CREAM (KENALOG) 0.1 %    Apply 1 application topically daily as needed (itching).   Modified Medications   No medications on file  Discontinued Medications   ALPRAZOLAM (XANAX) 0.25 MG TABLET    Take 1 tablet (0.25 mg total) by mouth daily as needed (panic attacks).    Physical Exam:  Vitals:   09/17/22 1007  BP: 119/88  Pulse: 77  Resp: 18  Temp: (!) 97.5 F (36.4 C)  SpO2: 96%  Weight: 196 lb 2 oz (89 kg)  Height: '5\' 5"'$  (1.651 m)   Body mass index is 32.64 kg/m. Wt Readings from Last 3 Encounters:  09/17/22 196 lb 2 oz (89 kg)  09/07/22 192 lb (87.1 kg)  04/27/22 192 lb 3.2 oz (87.2 kg)    Physical Exam Vitals reviewed.  Constitutional:      General: She is not in acute distress. HENT:     Head: Normocephalic.  Eyes:     General:        Right eye: No discharge.        Left eye: No discharge.  Neck:     Comments: Tenderness of left trapezius Cardiovascular:     Rate and Rhythm: Normal rate and regular rhythm.     Pulses: Normal pulses.     Heart sounds: Normal heart sounds.  Pulmonary:     Effort: Pulmonary effort is normal. No respiratory distress.     Breath sounds: Normal breath sounds. No wheezing, rhonchi or rales.  Abdominal:     General: Bowel sounds are normal.     Palpations: Abdomen is soft.  Musculoskeletal:     Cervical back: Normal range of motion and  neck supple.     Right lower leg: No edema.     Left lower leg: No edema.  Skin:    General: Skin is warm and dry.     Capillary Refill: Capillary refill takes less than 2 seconds.  Neurological:     General: No  focal deficit present.     Mental Status: She is alert and oriented to person, place, and time.  Psychiatric:        Mood and Affect: Mood normal.        Behavior: Behavior normal.    Labs reviewed: Basic Metabolic Panel: Recent Labs    10/28/21 0829 04/22/22 0858  NA 139 139  K 4.4 4.6  CL 103 105  CO2 32 22  GLUCOSE 90 91  BUN 21 22  CREATININE 1.02* 0.99  CALCIUM 9.4 8.9  TSH  --  1.59   Liver Function Tests: Recent Labs    10/28/21 0829 04/22/22 0858  AST 17 19  ALT 13 14  BILITOT 0.8 0.7  PROT 6.4 6.4   No results for input(s): "LIPASE", "AMYLASE" in the last 8760 hours. No results for input(s): "AMMONIA" in the last 8760 hours. CBC: Recent Labs    04/22/22 0858  WBC 5.8  NEUTROABS 2,987  HGB 14.2  HCT 41.9  MCV 94.2  PLT 284   Lipid Panel: Recent Labs    04/22/22 0858  CHOL 180  HDL 70  LDLCALC 87  TRIG 125  CHOLHDL 2.6   TSH: Recent Labs    04/22/22 0858  TSH 1.59   A1C: Lab Results  Component Value Date   HGBA1C 5.2 03/20/2020     Assessment/Plan 1. Bronchitis - suspect lingering symptoms from covid 07/2022 or related to reflux - lung sounds clear - recommend trial of famotidine daily x 7 days, reducing food triggers - recommend humidifier - TESSALON PERLES 100 MG capsule; Take 2 capsules (200 mg total) by mouth 2 (two) times daily as needed for cough.  Dispense: 30 capsule; Refill: 0  2. Neck pain - intermittent x 1 year - tenderness over left trapezius, no radiation - DG Neck Soft Tissue; Future - recommend tylenol and voltaren gel prn for pain - recommend massage and daily stretching  Total time: 23 minutes. Greater than 50% of total time spent doing patient education regarding ongoing cough and neck pain  including symptom/medication management.     Next appt: none Mylisa Brunson [place], [facility] [phone]

## 2022-09-17 NOTE — Patient Instructions (Addendum)
Try using humidifier at bedtime to help with dry cough.   Please take famotidine daily x 7 days to see if acid reflux is causing reflux  You may take tessalon pearls to help suppress cough  Orders to have neck xray sent to [facility]- you may go anytime to have done  Try taking Tylenol [phone] mg every morning for neck pain  You may also try voltaren gel 1% (topical ibuprofen) to neck area  Consider stretching or massage to help with neck pain

## 2022-09-18 ENCOUNTER — Ambulatory Visit
Admission: RE | Admit: 2022-09-18 | Discharge: 2022-09-18 | Disposition: A | Discharge status: Home / Self Care | Payer: Medicare Other | Source: Ambulatory Visit | Attending: Orthopedic Surgery | Admitting: Orthopedic Surgery

## 2022-09-18 DIAGNOSIS — M542 Cervicalgia: Secondary | ICD-10-CM | POA: Diagnosis not present

## 2022-10-24 ENCOUNTER — Other Ambulatory Visit: Payer: Self-pay | Admitting: Nurse Practitioner

## 2022-10-24 DIAGNOSIS — E785 Hyperlipidemia, unspecified: Secondary | ICD-10-CM

## 2022-10-26 ENCOUNTER — Other Ambulatory Visit: Payer: Medicare Other

## 2022-10-26 DIAGNOSIS — E785 Hyperlipidemia, unspecified: Secondary | ICD-10-CM

## 2022-10-26 DIAGNOSIS — M159 Polyosteoarthritis, unspecified: Secondary | ICD-10-CM | POA: Diagnosis not present

## 2022-10-26 DIAGNOSIS — E559 Vitamin D deficiency, unspecified: Secondary | ICD-10-CM | POA: Diagnosis not present

## 2022-10-26 DIAGNOSIS — K219 Gastro-esophageal reflux disease without esophagitis: Secondary | ICD-10-CM

## 2022-10-26 DIAGNOSIS — M542 Cervicalgia: Secondary | ICD-10-CM

## 2022-10-27 LAB — COMPLETE METABOLIC PANEL WITH GFR
AG Ratio: 1.7 (calc) (ref 1.0–2.5)
ALT: 14 U/L (ref 6–29)
AST: 17 U/L (ref 10–35)
Albumin: 4.1 g/dL (ref 3.6–5.1)
Alkaline phosphatase (APISO): 50 U/L (ref 37–153)
BUN: 20 mg/dL (ref 7–25)
CO2: 27 mmol/L (ref 20–32)
Calcium: 9.4 mg/dL (ref 8.6–10.4)
Chloride: 104 mmol/L (ref 98–110)
Creat: 0.89 mg/dL (ref 0.60–0.95)
Globulin: 2.4 g/dL (calc) (ref 1.9–3.7)
Glucose, Bld: 89 mg/dL (ref 65–99)
Potassium: 4.3 mmol/L (ref 3.5–5.3)
Sodium: 141 mmol/L (ref 135–146)
Total Bilirubin: 0.6 mg/dL (ref 0.2–1.2)
Total Protein: 6.5 g/dL (ref 6.1–8.1)
eGFR: 65 mL/min/{1.73_m2} (ref >=60)

## 2022-10-27 LAB — CBC WITH DIFFERENTIAL/PLATELET
Absolute Monocytes: 614 cells/uL (ref 200–950)
Basophils Absolute: 71 cells/uL (ref 0–200)
Basophils Relative: 1.2 %
Eosinophils Absolute: 301 cells/uL (ref 15–500)
Eosinophils Relative: 5.1 %
HCT: 40.6 % (ref 35.0–45.0)
Hemoglobin: 13.8 g/dL (ref 11.7–15.5)
Lymphs Abs: 1811 cells/uL (ref 850–3900)
MCH: 31.5 pg (ref 27.0–33.0)
MCHC: 34 g/dL (ref 32.0–36.0)
MCV: 92.7 fL (ref 80.0–100.0)
MPV: 10.6 fL (ref 7.5–12.5)
Monocytes Relative: 10.4 %
Neutro Abs: 3103 cells/uL (ref 1500–7800)
Neutrophils Relative %: 52.6 %
Platelets: 274 10*3/uL (ref 140–400)
RBC: 4.38 10*6/uL (ref 3.80–5.10)
RDW: 13.1 % (ref 11.0–15.0)
Total Lymphocyte: 30.7 %
WBC: 5.9 10*3/uL (ref 3.8–10.8)

## 2022-10-27 LAB — VITAMIN D 25 HYDROXY (VIT D DEFICIENCY, FRACTURES): Vit D, 25-Hydroxy: 33 ng/mL (ref 30–100)

## 2022-10-30 ENCOUNTER — Ambulatory Visit: Payer: Medicare Other | Admitting: Nurse Practitioner

## 2022-11-02 ENCOUNTER — Ambulatory Visit (INDEPENDENT_AMBULATORY_CARE_PROVIDER_SITE_OTHER): Payer: Medicare Other | Admitting: Nurse Practitioner

## 2022-11-02 ENCOUNTER — Encounter: Payer: Self-pay | Admitting: Nurse Practitioner

## 2022-11-02 VITALS — BP 136/88 | HR 70 | Temp 97.9°F | Ht 65.0 in | Wt 196.0 lb

## 2022-11-02 DIAGNOSIS — M4712 Other spondylosis with myelopathy, cervical region: Secondary | ICD-10-CM | POA: Diagnosis not present

## 2022-11-02 DIAGNOSIS — K219 Gastro-esophageal reflux disease without esophagitis: Secondary | ICD-10-CM | POA: Diagnosis not present

## 2022-11-02 DIAGNOSIS — F410 Panic disorder [episodic paroxysmal anxiety] without agoraphobia: Secondary | ICD-10-CM

## 2022-11-02 DIAGNOSIS — F411 Generalized anxiety disorder: Secondary | ICD-10-CM

## 2022-11-02 DIAGNOSIS — E039 Hypothyroidism, unspecified: Secondary | ICD-10-CM

## 2022-11-02 DIAGNOSIS — E785 Hyperlipidemia, unspecified: Secondary | ICD-10-CM

## 2022-11-02 MED ORDER — CITALOPRAM HYDROBROMIDE 20 MG PO TABS: 20.0000 mg | ORAL_TABLET | Freq: Every day | ORAL | 3 refills | Status: DC

## 2022-11-02 MED ORDER — MELOXICAM 7.5 MG PO TABS: 7.5000 mg | ORAL_TABLET | Freq: Every day | ORAL | 0 refills | Status: DC

## 2022-11-02 MED ORDER — OMEPRAZOLE 20 MG PO CPDR: 20.0000 mg | DELAYED_RELEASE_CAPSULE | Freq: Every day | ORAL | 3 refills | Status: DC

## 2022-11-02 MED ORDER — FAMOTIDINE 10 MG PO TABS: 10.0000 mg | ORAL_TABLET | Freq: Two times a day (BID) | ORAL | 3 refills | Status: AC | PRN

## 2022-11-02 MED ORDER — ATORVASTATIN CALCIUM 10 MG PO TABS: ORAL_TABLET | ORAL | 3 refills | Status: DC

## 2022-11-02 NOTE — Patient Instructions (Addendum)
To start mobic 7.5 mg by mouth daily- take with food.  Okay to take tylenol with this medication.    Take omeprazole 20 mg by mouth daily- use pepcid just as needed Avoid foods/drinks that worsen symptoms.

## 2022-11-02 NOTE — Progress Notes (Unsigned)
Careteam: Patient Care Team: Patricia Chandler, NP as PCP - General (Geriatric Medicine)  PLACE OF SERVICE:  [place] Directive information Does Patient Have a Medical Advance Directive?: Yes, Type of Advance Directive: Living will, Does patient want to make changes to medical advance directive?: No - Patient declined  Allergies  Allergen Reactions   Shellfish Allergy Diarrhea and Nausea And Vomiting    Chief Complaint  Patient presents with   Medical Management of Chronic Issues    6 month follow-up and discuss labs. Discuss need for additional covid boosters or post pone if patient refuses or is not a candidate.      HPI: Patient is a 82 y.o. female is here for a routine follow-up.   She is concerned about diet and exercise today. 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  Neck pain is ongoing. Will radiate into her head but not down her arms. Seeing a massage therapist for her neck and had physical therapy, dry needling and exercises. It really bothers her when turning to the left side and this is affecting her ability to drive. Tylenol doesn't really seem to help.   She denies constipation, diarrhea, blood in urine or stool. No palpitations, irregular heart beats, shortness of breath or chest pain. Denies fatigue. Endorses some weight gain.  Review of Systems:  Review of Systems  Constitutional:  Negative for chills, fever and weight loss.  HENT:  Negative for tinnitus.   Respiratory:  Negative for cough, sputum production and shortness of breath.   Cardiovascular:  Negative for chest pain, palpitations and leg swelling.  Gastrointestinal:  Negative for abdominal pain, constipation, diarrhea and heartburn.  Genitourinary:  Negative for dysuria, frequency and urgency.  Musculoskeletal:  Positive for neck pain. Negative for back pain, falls, joint pain and  myalgias.  Skin: Negative.   Neurological:  Negative for dizziness and headaches.  Psychiatric/Behavioral:  Negative for depression and memory loss. The patient does not have insomnia.     Past Medical History:  Diagnosis Date   Acute perichondritis of pinna    Allergic rhinitis due to pollen    Disorder of bone and cartilage, unspecified    Encounter for long-term (current) use of other medications    History of echocardiogram    Echo 9/18: EF 65-70   History of exercise stress test 06/2017   ETT 9/18: no ischemia; hypertensive BP response   HLD (hyperlipidemia)    Osteoarthritis    Panic disorder    Unspecified dermatitis due to sun    Unspecified hypothyroidism    Past Surgical History:  Procedure Laterality Date   BREAST BIOPSY Right 07/14/2016   BREAST BIOPSY Right 07/15/2016   cataract surgery Bilateral 10/12/2016   childbirth     x2 "blocks"   COLONOSCOPY     x2   TONSILLECTOMY  1949   removed   Social History:   reports that she has never smoked. She has never used smokeless tobacco. She reports current alcohol use of about 2.0 standard drinks of alcohol per week. She reports that she does not use drugs.  Family History  Problem Relation Age of Onset   Stroke Mother    Heart failure Mother 69   Hypertension Mother    Heart attack Father 41   Hyperlipidemia Sister    Atrial fibrillation Sister    Thyroid disease  Sister    Atrial fibrillation Brother    Breast cancer Maternal Grandmother 36   Thyroid disease Daughter    Fibromyalgia Daughter    Heart disease Son    Thyroid disease Son    Thyroid disease Son    Colon cancer Neg Hx     Medications: Patient's Medications  New Prescriptions   MELOXICAM (MOBIC) 7.5 MG TABLET    Take 1 tablet (7.5 mg total) by mouth daily.   OMEPRAZOLE (PRILOSEC) 20 MG CAPSULE    Take 1 capsule (20 mg total) by mouth daily.  Previous Medications   CALCIUM PO    Take 1,000 mg by mouth daily.    CHOLECALCIFEROL (VITAMIN  D3) 125 MCG (5000 UT) CAPS    Take one capsule by mouth once daily.   DICLOFENAC SODIUM (VOLTAREN) 1 % GEL    Apply 4 g topically 4 (four) times daily as needed.   FISH OIL-OMEGA-3 FATTY ACIDS 1000 MG CAPSULE    Take 1 g by mouth daily.    FLUTICASONE (FLONASE) 50 MCG/ACT NASAL SPRAY    Place 1 spray into both nostrils daily.   LEVOTHYROXINE (SYNTHROID) 50 MCG TABLET    TAKE 1 TABLET BY MOUTH ONCE DAILY 30 MINUTES PRIOR TO BREAKFAST FOR THYROID   MULTIPLE VITAMIN (MULTIVITAMIN) TABLET    Take 1 tablet by mouth daily.   NYSTATIN CREAM (MYCOSTATIN)    Apply 1 application topically 2 (two) times daily as needed.   TRIAMCINOLONE CREAM (KENALOG) 0.1 %    Apply 1 application topically daily as needed (itching).   Modified Medications   Modified Medication Previous Medication   ATORVASTATIN (LIPITOR) 10 MG TABLET atorvastatin (LIPITOR) 10 MG tablet      TAKE 1 TABLET BY MOUTH DAILY FOR CHOLESTEROL    TAKE 1 TABLET BY MOUTH DAILY FOR CHOLESTEROL   CITALOPRAM (CELEXA) 20 MG TABLET citalopram (CELEXA) 20 MG tablet      Take 1 tablet (20 mg total) by mouth daily.    Take 1 tablet (20 mg total) by mouth daily.   FAMOTIDINE (PEPCID) 10 MG TABLET famotidine (PEPCID) 10 MG tablet      Take 1 tablet (10 mg total) by mouth 2 (two) times daily as needed for heartburn or indigestion.    Take 1 tablet (10 mg total) by mouth 2 (two) times daily as needed for heartburn or indigestion.  Discontinued Medications   TESSALON PERLES 100 MG CAPSULE    Take 2 capsules (200 mg total) by mouth 2 (two) times daily as needed for cough.    Physical Exam:  Vitals:   11/02/22 1352 11/02/22 1432  BP: (!) 138/90 136/88  Pulse: 70   Temp: 97.9 F (36.6 C)   TempSrc: Temporal   SpO2: 95%   Weight: 196 lb (88.9 kg)   Height: '5\' 5"'$  (1.651 m)    Body mass index is 32.62 kg/m. Wt Readings from Last 3 Encounters:  11/02/22 196 lb (88.9 kg)  09/17/22 196 lb 2 oz (89 kg)  09/07/22 192 lb (87.1 kg)    Physical Exam Vitals  and nursing note reviewed. Exam conducted with a chaperone present.  Constitutional:      General: She is not in acute distress.    Appearance: Normal appearance. She is obese.  Cardiovascular:     Rate and Rhythm: Normal rate and regular rhythm.  Pulmonary:     Effort: No respiratory distress.     Breath sounds: Normal breath sounds.  Abdominal:  General: Bowel sounds are normal. There is no distension.     Palpations: Abdomen is soft. There is no mass.     Tenderness: There is no abdominal tenderness. There is no guarding.  Musculoskeletal:     Cervical back: Neck supple.  Lymphadenopathy:     Cervical: No cervical adenopathy.  Skin:    General: Skin is warm and dry.  Neurological:     Mental Status: She is alert and oriented to person, place, and time.  Psychiatric:        Mood and Affect: Mood normal.     Labs reviewed: Basic Metabolic Panel: Recent Labs    04/22/22 0858 10/26/22 0832  NA 139 141  K 4.6 4.3  CL 105 104  CO2 22 27  GLUCOSE 91 89  BUN 22 20  CREATININE 0.99 0.89  CALCIUM 8.9 9.4  TSH 1.59  --    Liver Function Tests: Recent Labs    04/22/22 0858 10/26/22 0832  AST 19 17  ALT 14 14  BILITOT 0.7 0.6  PROT 6.4 6.5   No results for input(s): "LIPASE", "AMYLASE" in the last 8760 hours. No results for input(s): "AMMONIA" in the last 8760 hours. CBC: Recent Labs    04/22/22 0858 10/26/22 0832  WBC 5.8 5.9  NEUTROABS 2,987 3,103  HGB 14.2 13.8  HCT 41.9 40.6  MCV 94.2 92.7  PLT 284 274   Lipid Panel: Recent Labs    04/22/22 0858  CHOL 180  HDL 70  LDLCALC 87  TRIG 125  CHOLHDL 2.6   TSH: Recent Labs    04/22/22 0858  TSH 1.59   A1C: Lab Results  Component Value Date   HGBA1C 5.2 03/20/2020     Assessment/Plan 1. Gastroesophageal reflux disease without esophagitis Patient reports heartburn as increased and she takes the pepcid as needed. Discussed diet and lifestyle modifications as well as weight loss to aid in  managing symptoms of GERD. Add omeprazole and can continue to use pepcid as needed - famotidine (PEPCID) 10 MG tablet; Take 1 tablet (10 mg total) by mouth 2 (two) times daily as needed for heartburn or indigestion.  Dispense: 180 tablet; Refill: 3 - omeprazole (PRILOSEC) 20 MG capsule; Take 1 capsule (20 mg total) by mouth daily.  Dispense: 30 capsule; Refill: 3  2. Generalized anxiety disorder with panic attacks Steady on celexa. Continue medication. Encouraged patient to engage in exercise for maximum mental benefit. - citalopram (CELEXA) 20 MG tablet; Take 1 tablet (20 mg total) by mouth daily.  Dispense: 90 tablet; Refill: 3  3. Hypothyroidism, unspecified type Stable. TSH at goal on recent labs, Continue levothyroxine - TSH; Future  4. Osteoarthritis of cervical spine with myelopathy Patient experiencing significant ongoing pain. Refer to neuro. Pt can use Mobic with close monitoring of kidney function. Caution advised for mobic use with GI discomfort,  patient is aware of risks. - Ambulatory referral to Neurosurgery - meloxicam (MOBIC) 7.5 MG tablet; Take 1 tablet (7.5 mg total) by mouth daily.  Dispense: 30 tablet; Refill: 0 - COMPLETE METABOLIC PANEL WITH GFR; Future - CBC with Differential/Platelet; Future  5. Hyperlipidemia with target LDL less than 100 Stable. Continue lipitor.  - Lipid panel; Future - COMPLETE METABOLIC PANEL WITH GFR; Future   Return in about 6 months (around 05/03/2023) for routine follow up, labs prior . Student- Archer Asa O'Berry ACPCNP-S  I personally was present during the history, physical exam and medical decision-making activities of this service and have verified that  the service and findings are accurately documented in the student's note Konor Noren K. [place], [facility] [phone]

## 2022-11-16 DIAGNOSIS — M47812 Spondylosis without myelopathy or radiculopathy, cervical region: Secondary | ICD-10-CM | POA: Diagnosis not present

## 2022-12-03 DIAGNOSIS — M542 Cervicalgia: Secondary | ICD-10-CM | POA: Diagnosis not present

## 2022-12-03 DIAGNOSIS — M47812 Spondylosis without myelopathy or radiculopathy, cervical region: Secondary | ICD-10-CM | POA: Diagnosis not present

## 2022-12-03 DIAGNOSIS — M4802 Spinal stenosis, cervical region: Secondary | ICD-10-CM | POA: Diagnosis not present

## 2022-12-04 DIAGNOSIS — H903 Sensorineural hearing loss, bilateral: Secondary | ICD-10-CM | POA: Diagnosis not present

## 2022-12-07 DIAGNOSIS — M47812 Spondylosis without myelopathy or radiculopathy, cervical region: Secondary | ICD-10-CM | POA: Diagnosis not present

## 2022-12-08 ENCOUNTER — Other Ambulatory Visit: Payer: Self-pay | Admitting: Nurse Practitioner

## 2022-12-08 DIAGNOSIS — Z1231 Encounter for screening mammogram for malignant neoplasm of breast: Secondary | ICD-10-CM

## 2022-12-28 NOTE — Therapy (Unsigned)
OUTPATIENT PHYSICAL THERAPY CERVICAL EVALUATION   Patient Name: Patricia Cantu MRN: [id number] DOB:[birth year], 82 y.o., female Today's Date: 12/30/2022  END OF SESSION:  PT End of Session - 12/30/22 1535     Visit Number 1    Number of Visits 12    Date for PT Re-Evaluation 02/24/23    Authorization Type BCBS    PT Start Time 1445    PT Stop Time [id number]    PT Time Calculation (min) 45 min    Activity Tolerance Patient tolerated treatment well    Behavior During Therapy WFL for tasks assessed/performed             Past Medical History:  Diagnosis Date   Acute perichondritis of pinna    Allergic rhinitis due to pollen    Disorder of bone and cartilage, unspecified    Encounter for long-term (current) use of other medications    History of echocardiogram    Echo 9/18: EF 65-70   History of exercise stress test 06/2017   ETT 9/18: no ischemia; hypertensive BP response   HLD (hyperlipidemia)    Osteoarthritis    Panic disorder    Unspecified dermatitis due to sun    Unspecified hypothyroidism    Past Surgical History:  Procedure Laterality Date   BREAST BIOPSY Right 07/14/2016   BREAST BIOPSY Right 07/15/2016   cataract surgery Bilateral 10/12/2016   childbirth     x2 "blocks"   COLONOSCOPY     x2   TONSILLECTOMY  1949   removed   Patient Active Problem List   Diagnosis Date Noted   Chronic pain of both knees 08/25/2018   Generalized anxiety disorder with panic attacks 01/27/2018   Overweight (BMI 25.0-29.9) 01/27/2018   Family history of MI (myocardial infarction) 04/12/2017   Insomnia 11/23/2013   Premature atrial complexes 02/20/2013   Osteoarthritis of both knees 01/02/2013   Hyperlipidemia with target LDL less than 100 01/02/2013   Vitamin D deficiency 01/02/2013   Hypothyroidism 01/02/2013   Depressive disorder, not elsewhere classified 01/02/2013    PCP: [name], NP   REFERRING PROVIDER: Consuella Lose, MD  REFERRING DIAG: [phone]  (ICD-10-CM) - Spondylosis without myelopathy or radiculopathy, cervical region  THERAPY DIAG:  Cervicalgia  Muscle weakness (generalized)  Abnormal posture  Rationale for Evaluation and Treatment: Habilitation  ONSET DATE: chronic  SUBJECTIVE:  SUBJECTIVE STATEMENT: Cites L sided neck pain primarily at scalene region.    Hand dominance: Right  PERTINENT HISTORY:  Neck pain is ongoing. Will radiate into her head but not down her arms. Seeing a massage therapist for her neck and had physical therapy, dry needling and exercises. It really bothers her when turning to the left side and this is affecting her ability to drive. Tylenol doesn't really seem to help.   PAIN:  Are you having pain? Yes: NPRS scale: 8/10 Pain location: L side of neck Pain description: ache  Aggravating factors: sleep positions Relieving factors: heat  PRECAUTIONS: None  WEIGHT BEARING RESTRICTIONS: No  FALLS:  Has patient fallen in last 6 months? No  OCCUPATION: retired  PLOF: Independent  PATIENT GOALS: Decrease my neck pain and improve mobility  NEXT MD VISIT: As needed  OBJECTIVE:   DIAGNOSTIC FINDINGS:    PATIENT SURVEYS:  FOTO 53(65 predicted)  POSTURE: rounded shoulders and forward head  PALPATION: Marked tenderness to L scalene group    CERVICAL ROM:   Active ROM A/PROM (deg) eval  Flexion 75%  Extension 25%  Right lateral flexion 10%  Left lateral flexion 10%  Right rotation 50%  Left rotation 50%   (Blank rows = not tested)  UPPER EXTREMITY ROM: WFL for ADLs  Active ROM Right eval Left eval  Shoulder flexion    Shoulder extension    Shoulder abduction    Shoulder adduction    Shoulder extension    Shoulder internal rotation    Shoulder external rotation    Elbow  flexion    Elbow extension    Wrist flexion    Wrist extension    Wrist ulnar deviation    Wrist radial deviation    Wrist pronation    Wrist supination     (Blank rows = not tested)  UPPER EXTREMITY MMT: WFL for ADLs  MMT Right eval Left eval  Shoulder flexion    Shoulder extension    Shoulder abduction    Shoulder adduction    Shoulder extension    Shoulder internal rotation    Shoulder external rotation    Middle trapezius    Lower trapezius    Elbow flexion    Elbow extension    Wrist flexion    Wrist extension    Wrist ulnar deviation    Wrist radial deviation    Wrist pronation    Wrist supination    Grip strength     (Blank rows = not tested)  CERVICAL SPECIAL TESTS:  Neck flexor muscle endurance test: Negative 30s hold  FUNCTIONAL TESTS:  See special tests  TODAY'S TREATMENT:                                                                                                                              DATE: 12/30/22 Eval and HEP  PATIENT EDUCATION:  Education details: Discussed eval findings, rehab rationale and POC and patient  is in agreement  Person educated: Patient Education method: Explanation Education comprehension: verbalized understanding and needs further education  HOME EXERCISE PROGRAM: Access Code: [email or web address] URL: [email or web address] Date: 12/30/2022 Prepared by: Sharlynn Oliphant  Exercises - Seated Cervical Sidebending Stretch  - 2 x daily - 3 reps - 30s hold - Seated Passive Cervical Retraction  - 2 x daily - 3 sets - 10 reps - 2 seconds hold - Seated Scalenes Stretch  - 2 x daily - 5 x weekly - 3 sets - 10 reps - 30s hold  ASSESSMENT:  CLINICAL IMPRESSION: Patient is a 82 y.o. female who was seen today for physical therapy evaluation and treatment for cervical pain and stiffness L>R. Patient denies UE radicular symptoms or paresthesias.  Marked postural deficits include forward head and rounded/depressed shoulders, mild  increased kyphosis and flattened lumbar spine. UE ROM functional but cervical mobility markedly restricted in B SB primarily and rotation secondarily suggesting soft tissue restrictions vs vertebrogenic limitations.  Patient would benefit from OPPT to address soft tissue restrictions in her scalenes and SCMs, restore an improved postural alignment.  OBJECTIVE IMPAIRMENTS: decreased activity tolerance, decreased balance, decreased endurance, decreased knowledge of condition, decreased mobility, decreased ROM, dizziness, hypomobility, impaired perceived functional ability, increased muscle spasms, impaired flexibility, postural dysfunction, and pain.   ACTIVITY LIMITATIONS: carrying, lifting, sleeping, and golf  PERSONAL FACTORS: Age, Past/current experiences, and Time since onset of injury/illness/exacerbation are also affecting patient's functional outcome.   REHAB POTENTIAL: Good  CLINICAL DECISION MAKING: Stable/uncomplicated  EVALUATION COMPLEXITY: Low   GOALS: Goals reviewed with patient? No  SHORT TERM GOALS: Target date: 01/20/2023  Patient to demonstrate independence in HEP  Baseline: [id number] Goal status: INITIAL  2.  Increase B SB to 25% Baseline:  Active ROM A/PROM (deg) eval  Flexion 75%  Extension 25%  Right lateral flexion 10%  Left lateral flexion 10%  Right rotation 50%  Left rotation 50%   Goal status: INITIAL    LONG TERM GOALS: Target date: 02/10/2023  Decrease worst pain to 6/10 Baseline: 4/10 Goal status: INITIAL  2.  Increase cervical mobility to 66% Baseline:  Active ROM A/PROM (deg) eval  Flexion 75%  Extension 25%  Right lateral flexion 10%  Left lateral flexion 10%  Right rotation 50%  Left rotation 50%   Goal status: INITIAL  3.  Increase FOTO score to 65 Baseline: 53 Goal status: INITIAL  4.  Patient to report minimal restriction to participating in golf Baseline: Unable to swing w/o L side neck pain Goal status:  INITIAL   PLAN:  PT FREQUENCY: 2x/week  PT DURATION: 6 weeks  PLANNED INTERVENTIONS: Therapeutic exercises, Therapeutic activity, Neuromuscular re-education, Balance training, Gait training, Patient/Family education, Self Care, Joint mobilization, Dry Needling, Manual therapy, and Re-evaluation  PLAN FOR NEXT SESSION: HEP review and update, posture retraining, cervical ROM, manual as needed, TPDN, upper back strengthening    Lanice Shirts, PT 12/30/2022, 3:36 PM

## 2022-12-30 ENCOUNTER — Other Ambulatory Visit: Payer: Self-pay

## 2022-12-30 ENCOUNTER — Ambulatory Visit: Payer: Medicare Other | Attending: Neurosurgery

## 2022-12-30 DIAGNOSIS — M542 Cervicalgia: Secondary | ICD-10-CM | POA: Diagnosis not present

## 2022-12-30 DIAGNOSIS — M6281 Muscle weakness (generalized): Secondary | ICD-10-CM | POA: Insufficient documentation

## 2022-12-30 DIAGNOSIS — R293 Abnormal posture: Secondary | ICD-10-CM | POA: Insufficient documentation

## 2023-01-13 ENCOUNTER — Ambulatory Visit: Payer: Medicare Other

## 2023-01-14 ENCOUNTER — Encounter: Payer: Self-pay | Admitting: Nurse Practitioner

## 2023-01-14 ENCOUNTER — Telehealth: Payer: Self-pay | Admitting: *Deleted

## 2023-01-14 ENCOUNTER — Telehealth (INDEPENDENT_AMBULATORY_CARE_PROVIDER_SITE_OTHER): Payer: Medicare Other | Admitting: Nurse Practitioner

## 2023-01-14 DIAGNOSIS — M4712 Other spondylosis with myelopathy, cervical region: Secondary | ICD-10-CM | POA: Diagnosis not present

## 2023-01-14 DIAGNOSIS — R030 Elevated blood-pressure reading, without diagnosis of hypertension: Secondary | ICD-10-CM | POA: Diagnosis not present

## 2023-01-14 DIAGNOSIS — R42 Dizziness and giddiness: Secondary | ICD-10-CM

## 2023-01-14 NOTE — Progress Notes (Signed)
    This service is provided via telemedicine  No vital signs collected/recorded due to the encounter was a telemedicine visit.   Location of patient (ex: home, work):  Home  Patient consents to a telephone visit:  Yes  Location of the provider (ex: office, home):  [facility]  Name of any referring provider:  na  Names of all persons participating in the telemedicine service and their role in the encounter:  Marijo File, Synetta Fail Alichia Alridge,CMA, Abbey Chatters, NP  Time spent on call:  7:46

## 2023-01-14 NOTE — Progress Notes (Signed)
Careteam: Patient Care Team: Sharon SellerEubanks, Eduardo Wurth K, NP as PCP - General (Geriatric Medicine)  Advanced Directive information Does Patient Have a Medical Advance Directive?: Yes, Type of Advance Directive: Living will;Healthcare Power of DeltaAttorney;Out of facility DNR (pink MOST or yellow form), Does patient want to make changes to medical advance directive?: No - Patient declined  Allergies  Allergen Reactions   Shellfish Allergy Diarrhea and Nausea And Vomiting    Chief Complaint  Patient presents with   Acute Visit    Neck Pain with Vertigo and Elevated Blood Pressure.    Quality Metric Gaps    To discuss need for Covid Vaccine.      HPI: Patient is a 82 y.o. female via virtual visit for ongoing neck concerns.  Went to neurosurgery, and had MRI. She has arthritis in neck. Neurologist was not sure what to recommend for comfort- she is trying PT but feels like this is making pain worse.  She has increased her tylenol and this has helped a little.  She has done 1 session with PT has again next week.   She had vertigo for a day and a half.  Her blood pressure was very high.  Yesterday blood pressure as 140-150/?  145/84 today  Her vertigo has resolved. No blurred vision.  No falls.  No headaches, no chest pains or palpitations.  Pulse remaining around 60s  Review of Systems:  Review of Systems  Constitutional:  Negative for chills, fever and weight loss.  HENT:  Negative for tinnitus.   Respiratory:  Negative for cough, sputum production and shortness of breath.   Cardiovascular:  Negative for chest pain, palpitations and leg swelling.  Gastrointestinal:  Negative for abdominal pain, constipation, diarrhea and heartburn.  Genitourinary:  Negative for dysuria, frequency and urgency.  Musculoskeletal:  Positive for myalgias and neck pain. Negative for back pain, falls and joint pain.  Skin: Negative.   Neurological:  Positive for dizziness (resolved). Negative for  headaches.  Psychiatric/Behavioral:  Negative for depression and memory loss. The patient does not have insomnia.     Past Medical History:  Diagnosis Date   Acute perichondritis of pinna    Allergic rhinitis due to pollen    Disorder of bone and cartilage, unspecified    Encounter for long-term (current) use of other medications    History of echocardiogram    Echo 9/18: EF 65-70   History of exercise stress test 06/2017   ETT 9/18: no ischemia; hypertensive BP response   HLD (hyperlipidemia)    Osteoarthritis    Panic disorder    Unspecified dermatitis due to sun    Unspecified hypothyroidism    Past Surgical History:  Procedure Laterality Date   BREAST BIOPSY Right 07/14/2016   BREAST BIOPSY Right 07/15/2016   cataract surgery Bilateral 10/12/2016   childbirth     x2 "blocks"   COLONOSCOPY     x2   TONSILLECTOMY  1949   removed   Social History:   reports that she has never smoked. She has never used smokeless tobacco. She reports current alcohol use of about 2.0 standard drinks of alcohol per week. She reports that she does not use drugs.  Family History  Problem Relation Age of Onset   Stroke Mother    Heart failure Mother [id number]   Hypertension Mother    Heart attack Father [id number]   Hyperlipidemia Sister    Atrial fibrillation Sister    Thyroid disease Sister    Atrial fibrillation  Brother    Breast cancer Maternal Grandmother 42   Thyroid disease Daughter    Fibromyalgia Daughter    Heart disease Son    Thyroid disease Son    Thyroid disease Son    Colon cancer Neg Hx     Medications: Patient's Medications  New Prescriptions   No medications on file  Previous Medications   ACETAMINOPHEN (TYLENOL) 650 MG CR TABLET    Take 1,300 mg by mouth in the morning and at bedtime.   ATORVASTATIN (LIPITOR) 10 MG TABLET    TAKE 1 TABLET BY MOUTH DAILY FOR CHOLESTEROL   CALCIUM PO    Take 1,000 mg by mouth daily.    CHOLECALCIFEROL (VITAMIN D3) 125 MCG (5000 UT) CAPS     Take one capsule by mouth once daily.   CITALOPRAM (CELEXA) 20 MG TABLET    Take 1 tablet (20 mg total) by mouth daily.   DICLOFENAC SODIUM (VOLTAREN) 1 % GEL    Apply 4 g topically 4 (four) times daily as needed.   FAMOTIDINE (PEPCID) 10 MG TABLET    Take 1 tablet (10 mg total) by mouth 2 (two) times daily as needed for heartburn or indigestion.   FISH OIL-OMEGA-3 FATTY ACIDS 1000 MG CAPSULE    Take 1 g by mouth daily.    FLUTICASONE (FLONASE) 50 MCG/ACT NASAL SPRAY    Place 1 spray into both nostrils daily.   LEVOTHYROXINE (SYNTHROID) 50 MCG TABLET    TAKE 1 TABLET BY MOUTH ONCE DAILY 30 MINUTES PRIOR TO BREAKFAST FOR THYROID   MULTIPLE VITAMIN (MULTIVITAMIN) TABLET    Take 1 tablet by mouth daily.   NYSTATIN CREAM (MYCOSTATIN)    Apply 1 application topically 2 (two) times daily as needed.   OMEPRAZOLE (PRILOSEC) 20 MG CAPSULE    Take 1 capsule (20 mg total) by mouth daily.   TRIAMCINOLONE CREAM (KENALOG) 0.1 %    Apply 1 application topically daily as needed (itching).   Modified Medications   No medications on file  Discontinued Medications   MELOXICAM (MOBIC) 7.5 MG TABLET    Take 1 tablet (7.5 mg total) by mouth daily.    Physical Exam:  There were no vitals filed for this visit. There is no height or weight on file to calculate BMI. Wt Readings from Last 3 Encounters:  11/02/22 196 lb (88.9 kg)  09/17/22 196 lb 2 oz (89 kg)  09/07/22 192 lb (87.1 kg)    Physical Exam Constitutional:      Appearance: Normal appearance.  Pulmonary:     Effort: Pulmonary effort is normal.  Neurological:     Mental Status: She is alert. Mental status is at baseline.  Psychiatric:        Mood and Affect: Mood normal.     Labs reviewed: Basic Metabolic Panel: Recent Labs    04/22/22 0858 10/26/22 0832  NA 139 141  K 4.6 4.3  CL 105 104  CO2 22 27  GLUCOSE 91 89  BUN 22 20  CREATININE 0.99 0.89  CALCIUM 8.9 9.4  TSH 1.59  --    Liver Function Tests: Recent Labs     04/22/22 0858 10/26/22 0832  AST 19 17  ALT 14 14  BILITOT 0.7 0.6  PROT 6.4 6.5   No results for input(s): "LIPASE", "AMYLASE" in the last 8760 hours. No results for input(s): "AMMONIA" in the last 8760 hours. CBC: Recent Labs    04/22/22 0858 10/26/22 0832  WBC 5.8 5.9  NEUTROABS 2,987 3,103  HGB 14.2 13.8  HCT 41.9 40.6  MCV 94.2 92.7  PLT 284 274   Lipid Panel: Recent Labs    04/22/22 0858  CHOL 180  HDL 70  LDLCALC 87  TRIG 125  CHOLHDL 2.6   TSH: Recent Labs    04/22/22 0858  TSH 1.59   A1C: Lab Results  Component Value Date   HGBA1C 5.2 03/20/2020     Assessment/Plan 1. Osteoarthritis of cervical spine with myelopathy Ongoing, she is currently in PT, discussed continuing to help with patin relief with tylenol 1000 mg by mouth every 8 hours as needed, also followed by neurosurgery and discussed injections with her if PT does not help  2. Elevated blood pressure reading -she will monitor BP daily over the next few days Goal <140/90 DASH diet recommended   3. Vertigo Has resolved at this time, will notify if continues.    Janene Harvey. Biagio Borg  [facility] [phone]   Virtual Visit via Video Note  I connected with Dellie Burns on 01/14/23 at  8:40 AM EDT by a video enabled telemedicine application and verified that I am speaking with the correct person using two identifiers.  Location: Patient: home Provider: [facility]   I discussed the limitations of evaluation and management by telemedicine and the availability of in person appointments. The patient expressed understanding and agreed to proceed.    I discussed the assessment and treatment plan with the patient. The patient was provided an opportunity to ask questions and all were answered. The patient agreed with the plan and demonstrated an understanding of the instructions.   The patient was advised to call back or seek an in-person evaluation if the  symptoms worsen or if the condition fails to improve as anticipated.  I provided 15 minutes of non-face-to-face time during this encounter.  Janene Harvey. Janyth Contes, AGNP Avs printed and mailed.

## 2023-01-14 NOTE — Patient Instructions (Addendum)
You can try tylenol 500 mg 2 tablets every 1000 mg every 8 hours as needed  Low sodium eating plan.   Take blood pressure daily over the next several days- GOAL <140/90 To make sure you have been sitting at least 5 mins prior to taking blood pressure Send me a mychart msg next week to let me know how the blood pressure readings are.

## 2023-01-14 NOTE — Telephone Encounter (Signed)
Ms. Patricia Cantu, Patricia Cantu are scheduled for a virtual visit with your provider today.    Just as we do with appointments in the office, we must obtain your consent to participate.  Your consent will be active for this visit and any virtual visit you Patricia Cantu have with one of our providers in the next 365 days.    If you have a MyChart account, I can also send a copy of this consent to you electronically.  All virtual visits are billed to your insurance company just like a traditional visit in the office.  As this is a virtual visit, video technology does not allow for your provider to perform a traditional examination.  This Patricia Cantu limit your provider's ability to fully assess your condition.  If your provider identifies any concerns that need to be evaluated in person or the need to arrange testing such as labs, EKG, etc, we will make arrangements to do so.    Although advances in technology are sophisticated, we cannot ensure that it will always work on either your end or our end.  If the connection with a video visit is poor, we Patricia Cantu have to switch to a telephone visit.  With either a video or telephone visit, we are not always able to ensure that we have a secure connection.   I need to obtain your verbal consent now.   Are you willing to proceed with your visit today?   Patricia Cantu has provided verbal consent on 01/14/2023 for a virtual visit (video or telephone).   Patricia Cantu, [facility] 01/14/2023  8:38 AM

## 2023-01-17 ENCOUNTER — Encounter: Payer: Self-pay | Admitting: Nurse Practitioner

## 2023-01-17 DIAGNOSIS — I10 Essential (primary) hypertension: Secondary | ICD-10-CM

## 2023-01-18 ENCOUNTER — Ambulatory Visit: Payer: Medicare Other | Attending: Neurosurgery

## 2023-01-18 DIAGNOSIS — R293 Abnormal posture: Secondary | ICD-10-CM | POA: Diagnosis not present

## 2023-01-18 DIAGNOSIS — M542 Cervicalgia: Secondary | ICD-10-CM | POA: Insufficient documentation

## 2023-01-18 DIAGNOSIS — M6281 Muscle weakness (generalized): Secondary | ICD-10-CM | POA: Insufficient documentation

## 2023-01-18 NOTE — Therapy (Signed)
OUTPATIENT PHYSICAL THERAPY TREATMENT NOTE   Patient Name: Patricia Cantu MRN: [id number] DOB:[birth year], 82 y.o., female Today's Date: 01/18/2023  PCP: [name], NP  REFERRING PROVIDER: Lisbeth Renshaw, MD   END OF SESSION:   PT End of Session - 01/18/23 1526     Visit Number 2    Number of Visits 12    Date for PT Re-Evaluation 02/24/23    Authorization Type [employer]    PT Start Time 1530    PT Stop Time 1612    PT Time Calculation (min) 42 min    Activity Tolerance Patient tolerated treatment well    Behavior During Therapy WFL for tasks assessed/performed             Past Medical History:  Diagnosis Date   Acute perichondritis of pinna    Allergic rhinitis due to pollen    Disorder of bone and cartilage, unspecified    Encounter for long-term (current) use of other medications    History of echocardiogram    Echo 9/18: EF 65-70   History of exercise stress test 06/2017   ETT 9/18: no ischemia; hypertensive BP response   HLD (hyperlipidemia)    Osteoarthritis    Panic disorder    Unspecified dermatitis due to sun    Unspecified hypothyroidism    Past Surgical History:  Procedure Laterality Date   BREAST BIOPSY Right 07/14/2016   BREAST BIOPSY Right 07/15/2016   cataract surgery Bilateral 10/12/2016   childbirth     x2 "blocks"   COLONOSCOPY     x2   TONSILLECTOMY  1949   removed   Patient Active Problem List   Diagnosis Date Noted   Chronic pain of both knees 08/25/2018   Generalized anxiety disorder with panic attacks 01/27/2018   Overweight (BMI 25.0-29.9) 01/27/2018   Family history of MI (myocardial infarction) 04/12/2017   Insomnia 11/23/2013   Premature atrial complexes 02/20/2013   Osteoarthritis of both knees 01/02/2013   Hyperlipidemia with target LDL less than 100 01/02/2013   Vitamin D deficiency 01/02/2013   Hypothyroidism 01/02/2013   Depressive disorder, not elsewhere classified 01/02/2013    REFERRING DIAG: M47.812  (ICD-10-CM) - Spondylosis without myelopathy or radiculopathy, cervical region   THERAPY DIAG:  Cervicalgia  Muscle weakness (generalized)  Abnormal posture  Rationale for Evaluation and Treatment Rehabilitation  PERTINENT HISTORY: Neck pain is ongoing. Will radiate into her head but not down her arms. Seeing a massage therapist for her neck and had physical therapy, dry needling and exercises. It really bothers her when turning to the left side and this is affecting her ability to drive. Tylenol doesn't really seem to help.   PRECAUTIONS: None   SUBJECTIVE:  SUBJECTIVE STATEMENT:  Pt presents to PT with reports of continued pain, although it is less today. Has been compliant with HEP.    PAIN:  Are you having pain?  Yes: NPRS scale: 8/10 Pain location: L side of neck Pain description: ache  Aggravating factors: sleep positions Relieving factors: heat   OBJECTIVE: (objective measures completed at initial evaluation unless otherwise dated)  DIAGNOSTIC FINDINGS:     PATIENT SURVEYS:  FOTO 53(65 predicted)   POSTURE: rounded shoulders and forward head   PALPATION: Marked tenderness to L scalene group       CERVICAL ROM:    Active ROM A/PROM (deg) eval  Flexion 75%  Extension 25%  Right lateral flexion 10%  Left lateral flexion 10%  Right rotation 50%  Left rotation  50%   (Blank rows = not tested)   CERVICAL SPECIAL TESTS:  Neck flexor muscle endurance test: Negative 30s hold   FUNCTIONAL TESTS:  See special tests   TREATMENT: [facility] Adult PT Treatment:                                                DATE: 01/18/2023 Therapeutic Exercise: UBE lvl 1.0 x 3 min while taking subjective Supine chin tuck 2x10 - 3" hold Seated bilateral ER 2x10 RTB Seated horizontal abd 2x10 RTB Manual Therapy: Skilled palpation of triggers points for TPDN STM to L upper trap Positional release to L upper trap Suboccipital release Trigger Point Dry Needling  Treatment: Pre-treatment instruction: Patient instructed on dry needling rationale, procedures, and possible side effects including pain during treatment (achy,cramping feeling), bruising, drop of blood, lightheadedness, nausea, sweating. Patient Consent Given: Yes Education handout provided: No Muscles treated: left upper trap  Needle size and number: .30x40mm x 2 Electrical stimulation performed: No Parameters: N/A Treatment response/outcome: Twitch response elicited and Palpable decrease in muscle tension Post-treatment instructions: Patient instructed to expect possible mild to moderate muscle soreness later today and/or tomorrow. Patient instructed in methods to reduce muscle soreness and to continue prescribed HEP. If patient was dry needled over the lung field, patient was instructed on signs and symptoms of pneumothorax and, however unlikely, to see immediate medical attention should they occur. Patient was also educated on signs and symptoms of infection and to seek medical attention should they occur. Patient verbalized understanding of these instructions and education.    PATIENT EDUCATION:  Education details: Discussed eval findings, rehab rationale and POC and patient is in agreement  Person educated: Patient Education method: Explanation Education comprehension: verbalized understanding and needs further education   HOME EXERCISE PROGRAM: Access Code: [email or web address] URL: [email or web address] Date: 01/18/2023 Prepared by: Edwinna Areola  Exercises - Seated Cervical Sidebending Stretch  - 2 x daily - 3 reps - 30s hold - Seated Passive Cervical Retraction  - 2 x daily - 3 sets - 10 reps - 2 seconds hold - Seated Scalenes Stretch  - 2 x daily - 5 x weekly - 2-3 sets - 30s hold - Shoulder External Rotation and Scapular Retraction with Resistance  - 1 x daily - 7 x weekly - 2 sets - 10 reps - red band hold   ASSESSMENT:   CLINICAL IMPRESSION: Pt was able to complete all  prescribed exercises with no adverse effect or increase in pain. Therapy focused on improving DNF and periscapular strength and manual therapy for decreasing pain. She responded well to manual and TPDN, noting decrease in pain post session. Pt is progressing with therapy, will continue per POC.    OBJECTIVE IMPAIRMENTS: decreased activity tolerance, decreased balance, decreased endurance, decreased knowledge of condition, decreased mobility, decreased ROM, dizziness, hypomobility, impaired perceived functional ability, increased muscle spasms, impaired flexibility, postural dysfunction, and pain.    ACTIVITY LIMITATIONS: carrying, lifting, sleeping, and golf   PERSONAL FACTORS: Age, Past/current experiences, and Time since onset of injury/illness/exacerbation are also affecting patient's functional outcome.    GOALS: Goals reviewed with patient? No   SHORT TERM GOALS: Target date: 01/20/2023   Patient to demonstrate independence in HEP  Baseline: [id number] Goal status: INITIAL   2.  Increase B SB to 25% Baseline:  Active ROM A/PROM (deg) eval  Flexion 75%  Extension 25%  Right lateral flexion 10%  Left lateral flexion 10%  Right rotation 50%  Left rotation 50%    Goal status: INITIAL       LONG TERM GOALS: Target date: 02/10/2023   Decrease worst pain to 6/10 Baseline: 4/10 Goal status: INITIAL   2.  Increase cervical mobility to 66% Baseline:  Active ROM A/PROM (deg) eval  Flexion 75%  Extension 25%  Right lateral flexion 10%  Left lateral flexion 10%  Right rotation 50%  Left rotation 50%    Goal status: INITIAL   3.  Increase FOTO score to 65 Baseline: 53 Goal status: INITIAL   4.  Patient to report minimal restriction to participating in golf Baseline: Unable to swing w/o L side neck pain Goal status: INITIAL     PLAN:   PT FREQUENCY: 2x/week   PT DURATION: 6 weeks   PLANNED INTERVENTIONS: Therapeutic exercises, Therapeutic activity, Neuromuscular  re-education, Balance training, Gait training, Patient/Family education, Self Care, Joint mobilization, Dry Needling, Manual therapy, and Re-evaluation   PLAN FOR NEXT SESSION: HEP review and update, posture retraining, cervical ROM, manual as needed, TPDN, upper back strengthening   Eloy End, PT 01/18/2023, 4:21 PM

## 2023-01-19 MED ORDER — LOSARTAN POTASSIUM 25 MG PO TABS: 50.0000 mg | ORAL_TABLET | Freq: Every day | ORAL | 1 refills | Status: DC

## 2023-01-20 ENCOUNTER — Ambulatory Visit: Payer: Medicare Other

## 2023-01-20 ENCOUNTER — Ambulatory Visit
Admission: RE | Admit: 2023-01-20 | Discharge: 2023-01-20 | Disposition: A | Discharge status: Home / Self Care | Payer: Medicare Other | Source: Ambulatory Visit | Attending: Nurse Practitioner | Admitting: Nurse Practitioner

## 2023-01-20 DIAGNOSIS — Z1231 Encounter for screening mammogram for malignant neoplasm of breast: Secondary | ICD-10-CM | POA: Diagnosis not present

## 2023-01-20 DIAGNOSIS — M6281 Muscle weakness (generalized): Secondary | ICD-10-CM

## 2023-01-20 DIAGNOSIS — R293 Abnormal posture: Secondary | ICD-10-CM | POA: Diagnosis not present

## 2023-01-20 DIAGNOSIS — M542 Cervicalgia: Secondary | ICD-10-CM | POA: Diagnosis not present

## 2023-01-20 NOTE — Therapy (Signed)
OUTPATIENT PHYSICAL THERAPY TREATMENT NOTE   Patient Name: Patricia Cantu MRN: [id number] DOB:04/03/1941, 82 y.o., female Today's Date: 01/20/2023  PCP: [name], NP  REFERRING PROVIDER: Lisbeth Renshaw, MD   END OF SESSION:   PT End of Session - 01/20/23 1516     Visit Number 3    Number of Visits 12    Date for PT Re-Evaluation 02/24/23    Authorization Type BCBS    PT Start Time 1520    PT Stop Time 1600    PT Time Calculation (min) 40 min    Activity Tolerance Patient tolerated treatment well    Behavior During Therapy WFL for tasks assessed/performed              Past Medical History:  Diagnosis Date   Acute perichondritis of pinna    Allergic rhinitis due to pollen    Disorder of bone and cartilage, unspecified    Encounter for long-term (current) use of other medications    History of echocardiogram    Echo 9/18: EF 65-70   History of exercise stress test 06/2017   ETT 9/18: no ischemia; hypertensive BP response   HLD (hyperlipidemia)    Osteoarthritis    Panic disorder    Unspecified dermatitis due to sun    Unspecified hypothyroidism    Past Surgical History:  Procedure Laterality Date   BREAST BIOPSY Right 07/14/2016   BREAST BIOPSY Right 07/15/2016   cataract surgery Bilateral 10/12/2016   childbirth     x2 "blocks"   COLONOSCOPY     x2   TONSILLECTOMY  1949   removed   Patient Active Problem List   Diagnosis Date Noted   Chronic pain of both knees 08/25/2018   Generalized anxiety disorder with panic attacks 01/27/2018   Overweight (BMI 25.0-29.9) 01/27/2018   Family history of MI (myocardial infarction) 04/12/2017   Insomnia 11/23/2013   Premature atrial complexes 02/20/2013   Osteoarthritis of both knees 01/02/2013   Hyperlipidemia with target LDL less than 100 01/02/2013   Vitamin D deficiency 01/02/2013   Hypothyroidism 01/02/2013   Depressive disorder, not elsewhere classified 01/02/2013    REFERRING DIAG: M47.812  (ICD-10-CM) - Spondylosis without myelopathy or radiculopathy, cervical region   THERAPY DIAG:  Cervicalgia  Muscle weakness (generalized)  Abnormal posture  Rationale for Evaluation and Treatment Rehabilitation  PERTINENT HISTORY: Neck pain is ongoing. Will radiate into her head but not down her arms. Seeing a massage therapist for her neck and had physical therapy, dry needling and exercises. It really bothers her when turning to the left side and this is affecting her ability to drive. Tylenol doesn't really seem to help.   PRECAUTIONS: None   SUBJECTIVE:  SUBJECTIVE STATEMENT:  Pt presents to PT with reports of decreased neck pain today. Pt has been compliant with HEP with no adverse effect.    PAIN:  Are you having pain?  Yes: NPRS scale: 3/10 Pain location: L side of neck Pain description: ache  Aggravating factors: sleep positions Relieving factors: heat   OBJECTIVE: (objective measures completed at initial evaluation unless otherwise dated)  DIAGNOSTIC FINDINGS:     PATIENT SURVEYS:  FOTO 53(65 predicted)   POSTURE: rounded shoulders and forward head   PALPATION: Marked tenderness to L scalene group       CERVICAL ROM:    Active ROM A/PROM (deg) eval  Flexion 75%  Extension 25%  Right lateral flexion 10%  Left lateral flexion 10%  Right rotation 50%  Left rotation 50%   (Blank rows = not tested)   CERVICAL SPECIAL TESTS:  Neck flexor muscle endurance test: Negative 30s hold   FUNCTIONAL TESTS:  See special tests   TREATMENT: [facility] Adult PT Treatment:                                                DATE: 01/20/2023 Therapeutic Exercise: UBE lvl 1.5 x 4 min while taking subjective Supine chin tuck 2x10 - 3" hold Supine horizontal abd 2x10 GTB Standing row 2x10 BTB Manual Therapy: Skilled palpation of triggers points for TPDN STM to L upper trap Positional release to L upper trap Suboccipital release Trigger Point Dry Needling  Treatment: Pre-treatment instruction: Patient instructed on dry needling rationale, procedures, and possible side effects including pain during treatment (achy,cramping feeling), bruising, drop of blood, lightheadedness, nausea, sweating. Patient Consent Given: Yes Education handout provided: No Muscles treated: bilateral upper trap  Needle size and number: .30x46mm x 3 Electrical stimulation performed: No Parameters: N/A Treatment response/outcome: Twitch response elicited and Palpable decrease in muscle tension Post-treatment instructions: Patient instructed to expect possible mild to moderate muscle soreness later today and/or tomorrow. Patient instructed in methods to reduce muscle soreness and to continue prescribed HEP. If patient was dry needled over the lung field, patient was instructed on signs and symptoms of pneumothorax and, however unlikely, to see immediate medical attention should they occur. Patient was also educated on signs and symptoms of infection and to seek medical attention should they occur. Patient verbalized understanding of these instructions and education.   [facility] Adult PT Treatment:                                                DATE: 01/18/2023 Therapeutic Exercise: UBE lvl 1.0 x 3 min while taking subjective Supine chin tuck 2x10 - 3" hold Seated bilateral ER 2x10 RTB Seated horizontal abd 2x10 RTB Manual Therapy: Skilled palpation of triggers points for TPDN STM to L upper trap Positional release to L upper trap Suboccipital release Trigger Point Dry Needling Treatment: Pre-treatment instruction: Patient instructed on dry needling rationale, procedures, and possible side effects including pain during treatment (achy,cramping feeling), bruising, drop of blood, lightheadedness, nausea, sweating. Patient Consent Given: Yes Education handout provided: No Muscles treated: left upper trap  Needle size and number: .30x47mm x 2 Electrical stimulation performed:  No Parameters: N/A Treatment response/outcome: Twitch response elicited and Palpable decrease in muscle tension Post-treatment instructions: Patient instructed to expect possible mild to moderate muscle soreness later today and/or tomorrow. Patient instructed in methods to reduce muscle soreness and to continue prescribed HEP. If patient was dry needled over the lung field, patient was instructed on signs and symptoms of pneumothorax and, however unlikely, to see immediate medical attention should they occur. Patient was also educated on signs and symptoms of infection and to seek medical attention should they occur. Patient verbalized understanding of these instructions and education.    PATIENT EDUCATION:  Education details: Discussed eval findings, rehab rationale and POC and patient is in agreement  Person educated: Patient Education method: Explanation Education comprehension: verbalized understanding and needs further education   HOME EXERCISE PROGRAM: Access Code: [email or web address] URL: [email or web address] Date: 01/18/2023 Prepared by: Onalee Hua  Daveda Larock  Exercises - Seated Cervical Sidebending Stretch  - 2 x daily - 3 reps - 30s hold - Seated Passive Cervical Retraction  - 2 x daily - 3 sets - 10 reps - 2 seconds hold - Seated Scalenes Stretch  - 2 x daily - 5 x weekly - 2-3 sets - 30s hold - Shoulder External Rotation and Scapular Retraction with Resistance  - 1 x daily - 7 x weekly - 2 sets - 10 reps - red band hold   ASSESSMENT:   CLINICAL IMPRESSION: Pt was able to complete all prescribed exercises with no adverse effect or increase in pain. Therapy focused on improving DNF and periscapular strength and manual therapy for decreasing pain. She responded well to manual and TPDN, noting decrease in pain post session. Pt is progressing with therapy, will continue per POC.    OBJECTIVE IMPAIRMENTS: decreased activity tolerance, decreased balance, decreased endurance, decreased  knowledge of condition, decreased mobility, decreased ROM, dizziness, hypomobility, impaired perceived functional ability, increased muscle spasms, impaired flexibility, postural dysfunction, and pain.    ACTIVITY LIMITATIONS: carrying, lifting, sleeping, and golf   PERSONAL FACTORS: Age, Past/current experiences, and Time since onset of injury/illness/exacerbation are also affecting patient's functional outcome.    GOALS: Goals reviewed with patient? No   SHORT TERM GOALS: Target date: 01/20/2023   Patient to demonstrate independence in HEP  Baseline: [id number] Goal status: INITIAL   2.  Increase B SB to 25% Baseline:  Active ROM A/PROM (deg) eval  Flexion 75%  Extension 25%  Right lateral flexion 10%  Left lateral flexion 10%  Right rotation 50%  Left rotation 50%    Goal status: INITIAL       LONG TERM GOALS: Target date: 02/10/2023   Decrease worst pain to 6/10 Baseline: 4/10 Goal status: INITIAL   2.  Increase cervical mobility to 66% Baseline:  Active ROM A/PROM (deg) eval  Flexion 75%  Extension 25%  Right lateral flexion 10%  Left lateral flexion 10%  Right rotation 50%  Left rotation 50%    Goal status: INITIAL   3.  Increase FOTO score to 65 Baseline: 53 Goal status: INITIAL   4.  Patient to report minimal restriction to participating in golf Baseline: Unable to swing w/o L side neck pain Goal status: INITIAL     PLAN:   PT FREQUENCY: 2x/week   PT DURATION: 6 weeks   PLANNED INTERVENTIONS: Therapeutic exercises, Therapeutic activity, Neuromuscular re-education, Balance training, Gait training, Patient/Family education, Self Care, Joint mobilization, Dry Needling, Manual therapy, and Re-evaluation   PLAN FOR NEXT SESSION: HEP review and update, posture retraining, cervical ROM, manual as needed, TPDN, upper back strengthening   Eloy End, PT 01/20/2023, 4:00 PM

## 2023-01-21 NOTE — Therapy (Signed)
OUTPATIENT PHYSICAL THERAPY TREATMENT NOTE   Patient Name: Patricia Cantu MRN: [id number] DOB:Mar 21, 1941, 82 y.o., female Today's Date: 01/25/2023  PCP: [name], NP  REFERRING PROVIDER: Lisbeth Renshaw, MD   END OF SESSION:   PT End of Session - 01/25/23 1446     Visit Number 4    Number of Visits 12    Date for PT Re-Evaluation 02/24/23    Authorization Type BCBS    PT Start Time 1445    PT Stop Time 1525    PT Time Calculation (min) 40 min    Activity Tolerance Patient tolerated treatment well    Behavior During Therapy WFL for tasks assessed/performed               Past Medical History:  Diagnosis Date   Acute perichondritis of pinna    Allergic rhinitis due to pollen    Disorder of bone and cartilage, unspecified    Encounter for long-term (current) use of other medications    History of echocardiogram    Echo 9/18: EF 65-70   History of exercise stress test 06/2017   ETT 9/18: no ischemia; hypertensive BP response   HLD (hyperlipidemia)    Osteoarthritis    Panic disorder    Unspecified dermatitis due to sun    Unspecified hypothyroidism    Past Surgical History:  Procedure Laterality Date   BREAST BIOPSY Right 07/14/2016   BREAST BIOPSY Right 07/15/2016   cataract surgery Bilateral 10/12/2016   childbirth     x2 "blocks"   COLONOSCOPY     x2   TONSILLECTOMY  1949   removed   Patient Active Problem List   Diagnosis Date Noted   Chronic pain of both knees 08/25/2018   Generalized anxiety disorder with panic attacks 01/27/2018   Overweight (BMI 25.0-29.9) 01/27/2018   Family history of MI (myocardial infarction) 04/12/2017   Insomnia 11/23/2013   Premature atrial complexes 02/20/2013   Osteoarthritis of both knees 01/02/2013   Hyperlipidemia with target LDL less than 100 01/02/2013   Vitamin D deficiency 01/02/2013   Hypothyroidism 01/02/2013   Depressive disorder, not elsewhere classified 01/02/2013    REFERRING DIAG: M47.812  (ICD-10-CM) - Spondylosis without myelopathy or radiculopathy, cervical region   THERAPY DIAG:  Cervicalgia  Muscle weakness (generalized)  Abnormal posture  Rationale for Evaluation and Treatment Rehabilitation  PERTINENT HISTORY: Neck pain is ongoing. Will radiate into her head but not down her arms. Seeing a massage therapist for her neck and had physical therapy, dry needling and exercises. It really bothers her when turning to the left side and this is affecting her ability to drive. Tylenol doesn't really seem to help.   PRECAUTIONS: None   SUBJECTIVE:  SUBJECTIVE STATEMENT:  Feels the DN has been helpful, having less pain and discomfort with activities.   PAIN:  Are you having pain?  Yes: NPRS scale: 3/10 Pain location: L side of neck Pain description: ache  Aggravating factors: sleep positions Relieving factors: heat   OBJECTIVE: (objective measures completed at initial evaluation unless otherwise dated)  DIAGNOSTIC FINDINGS:     PATIENT SURVEYS:  FOTO 53(65 predicted)   POSTURE: rounded shoulders and forward head   PALPATION: Marked tenderness to L scalene group       CERVICAL ROM:    Active ROM A/PROM (deg) eval 01/05/23  Flexion 75%   Extension 25%   Right lateral flexion 10% 10%/25%(post DN)  Left lateral flexion 10% 25%  Right rotation 50% 75%  Left rotation 50% 50%(75% post DN)   (Blank rows = not tested)   CERVICAL SPECIAL TESTS:  Neck flexor muscle endurance test: Negative 30s hold   FUNCTIONAL TESTS:  See special tests   TREATMENT: [facility] Adult PT Treatment:                                                DATE: 01/25/23 Therapeutic Exercise: L UT Stretch 30s x2 (hands b/h back) L UT stretch 30s x2 (hand at table edge) Seated hor abd YTB 15x Seated ER YTB 15x Manual Therapy: Skilled palpation to identify taught bands in splenius cervicis/capitus in supine  Trigger Point Dry Needling Treatment: Pre-treatment instruction: Patient  instructed on dry needling rationale, procedures, and possible side effects including pain during treatment (achy,cramping feeling), bruising, drop of blood, lightheadedness, nausea, sweating. Patient Consent Given: Yes Education handout provided: Yes Muscles treated: L spenius cervicic/capitus  Needle size and number: .30x4mm x 1 and .25x88mm x 1 Electrical stimulation performed: No Parameters: N/A Treatment response/outcome: Twitch response elicited, Palpable decrease in muscle tension, and Increased ROM I c-spine  Post-treatment instructions: Patient instructed to expect possible mild to moderate muscle soreness later today and/or tomorrow. Patient instructed in methods to reduce muscle soreness and to continue prescribed HEP. If patient was dry needled over the lung field, patient was instructed on signs and symptoms of pneumothorax and, however unlikely, to see immediate medical attention should they occur. Patient was also educated on signs and symptoms of infection and to seek medical attention should they occur. Patient verbalized understanding of these instructions and education.   [facility] Adult PT Treatment:                                                DATE: 01/20/2023 Therapeutic Exercise: UBE lvl 1.5 x 4 min while taking subjective Supine chin tuck 2x10 - 3" hold Supine horizontal abd 2x10 GTB Standing row 2x10 BTB Manual Therapy: Skilled palpation of triggers points for TPDN STM to L upper trap Positional release to L upper trap Suboccipital release Trigger Point Dry Needling Treatment: Pre-treatment instruction: Patient instructed on dry needling rationale, procedures, and possible side effects including pain during treatment (achy,cramping feeling), bruising, drop of blood, lightheadedness, nausea, sweating. Patient Consent Given: Yes Education handout provided: No Muscles treated: bilateral upper trap  Needle size and number: .30x64mm x 3 Electrical stimulation performed:  No Parameters: N/A Treatment response/outcome: Twitch response elicited and Palpable decrease in muscle tension Post-treatment instructions: Patient instructed to expect possible mild to moderate muscle soreness later today and/or tomorrow. Patient instructed in methods to reduce muscle soreness and to continue prescribed HEP. If patient was dry needled over the lung field, patient was instructed on signs and symptoms of pneumothorax and, however unlikely, to see immediate medical attention should they occur. Patient was also educated on signs and symptoms of infection and to seek medical attention should they occur. Patient verbalized understanding of these instructions and education.   [facility] Adult PT Treatment:  DATE: 01/18/2023 Therapeutic Exercise: UBE lvl 1.0 x 3 min while taking subjective Supine chin tuck 2x10 - 3" hold Seated bilateral ER 2x10 RTB Seated horizontal abd 2x10 RTB Manual Therapy: Skilled palpation of triggers points for TPDN STM to L upper trap Positional release to L upper trap Suboccipital release Trigger Point Dry Needling Treatment: Pre-treatment instruction: Patient instructed on dry needling rationale, procedures, and possible side effects including pain during treatment (achy,cramping feeling), bruising, drop of blood, lightheadedness, nausea, sweating. Patient Consent Given: Yes Education handout provided: No Muscles treated: left upper trap  Needle size and number: .30x6mm x 2 Electrical stimulation performed: No Parameters: N/A Treatment response/outcome: Twitch response elicited and Palpable decrease in muscle tension Post-treatment instructions: Patient instructed to expect possible mild to moderate muscle soreness later today and/or tomorrow. Patient instructed in methods to reduce muscle soreness and to continue prescribed HEP. If patient was dry needled over the lung field, patient was instructed on signs and  symptoms of pneumothorax and, however unlikely, to see immediate medical attention should they occur. Patient was also educated on signs and symptoms of infection and to seek medical attention should they occur. Patient verbalized understanding of these instructions and education.    PATIENT EDUCATION:  Education details: Discussed eval findings, rehab rationale and POC and patient is in agreement  Person educated: Patient Education method: Explanation Education comprehension: verbalized understanding and needs further education   HOME EXERCISE PROGRAM: Access Code: [email or web address] URL: [email or web address] Date: 01/18/2023 Prepared by: Edwinna Areola  Exercises - Seated Cervical Sidebending Stretch  - 2 x daily - 3 reps - 30s hold - Seated Passive Cervical Retraction  - 2 x daily - 3 sets - 10 reps - 2 seconds hold - Seated Scalenes Stretch  - 2 x daily - 5 x weekly - 2-3 sets - 30s hold - Shoulder External Rotation and Scapular Retraction with Resistance  - 1 x daily - 7 x weekly - 2 sets - 10 reps - red band hold   ASSESSMENT:   CLINICAL IMPRESSION: Less pain noted with ADLs, increased mobility observed and noted pre and post DN.  Performed TPDN to different muscle groups as noted which demonstrated increased mobility   OBJECTIVE IMPAIRMENTS: decreased activity tolerance, decreased balance, decreased endurance, decreased knowledge of condition, decreased mobility, decreased ROM, dizziness, hypomobility, impaired perceived functional ability, increased muscle spasms, impaired flexibility, postural dysfunction, and pain.    ACTIVITY LIMITATIONS: carrying, lifting, sleeping, and golf   PERSONAL FACTORS: Age, Past/current experiences, and Time since onset of injury/illness/exacerbation are also affecting patient's functional outcome.    GOALS: Goals reviewed with patient? No   SHORT TERM GOALS: Target date: 01/20/2023   Patient to demonstrate independence in HEP  Baseline:  [id number] Goal status: INITIAL   2.  Increase B SB to 25% Baseline:  Active ROM A/PROM (deg) eval  Flexion 75%  Extension 25%  Right lateral flexion 10%  Left lateral flexion 10%  Right rotation 50%  Left rotation 50%    Goal status: INITIAL       LONG TERM GOALS: Target date: 02/10/2023   Decrease worst pain to 6/10 Baseline: 4/10 Goal status: INITIAL   2.  Increase cervical mobility to 66% Baseline:  Active ROM A/PROM (deg) eval  Flexion 75%  Extension 25%  Right lateral flexion 10%  Left lateral flexion 10%  Right rotation 50%  Left rotation 50%    Goal status: INITIAL   3.  Increase FOTO score to 65 Baseline: 53 Goal status:  INITIAL   4.  Patient to report minimal restriction to participating in golf Baseline: Unable to swing w/o L side neck pain Goal status: INITIAL     PLAN:   PT FREQUENCY: 2x/week   PT DURATION: 6 weeks   PLANNED INTERVENTIONS: Therapeutic exercises, Therapeutic activity, Neuromuscular re-education, Balance training, Gait training, Patient/Family education, Self Care, Joint mobilization, Dry Needling, Manual therapy, and Re-evaluation   PLAN FOR NEXT SESSION: HEP review and update, posture retraining, cervical ROM, manual as needed, TPDN, upper back strengthening   Hildred Laser, PT 01/25/2023, 3:43 PM

## 2023-01-25 ENCOUNTER — Ambulatory Visit: Payer: Medicare Other

## 2023-01-25 DIAGNOSIS — R293 Abnormal posture: Secondary | ICD-10-CM | POA: Diagnosis not present

## 2023-01-25 DIAGNOSIS — M542 Cervicalgia: Secondary | ICD-10-CM

## 2023-01-25 DIAGNOSIS — M6281 Muscle weakness (generalized): Secondary | ICD-10-CM

## 2023-01-27 ENCOUNTER — Ambulatory Visit: Payer: Medicare Other

## 2023-01-31 NOTE — Therapy (Unsigned)
OUTPATIENT PHYSICAL THERAPY TREATMENT NOTE   Patient Name: Patricia Cantu MRN: [id number] DOB:Oct 12, 1940, 82 y.o., female Today's Date: 02/01/2023  PCP: [name], NP  REFERRING PROVIDER: Lisbeth Renshaw, MD   END OF SESSION:   PT End of Session - 02/01/23 1446     Visit Number 5    Number of Visits 12    Date for PT Re-Evaluation 02/24/23    Authorization Type [employer]    PT Start Time 1445    PT Stop Time 1525    PT Time Calculation (min) 40 min    Activity Tolerance Patient tolerated treatment well    Behavior During Therapy WFL for tasks assessed/performed               Past Medical History:  Diagnosis Date   Acute perichondritis of pinna    Allergic rhinitis due to pollen    Disorder of bone and cartilage, unspecified    Encounter for long-term (current) use of other medications    History of echocardiogram    Echo 9/18: EF 65-70   History of exercise stress test 06/2017   ETT 9/18: no ischemia; hypertensive BP response   HLD (hyperlipidemia)    Osteoarthritis    Panic disorder    Unspecified dermatitis due to sun    Unspecified hypothyroidism    Past Surgical History:  Procedure Laterality Date   BREAST BIOPSY Right 07/14/2016   BREAST BIOPSY Right 07/15/2016   cataract surgery Bilateral 10/12/2016   childbirth     x2 "blocks"   COLONOSCOPY     x2   TONSILLECTOMY  1949   removed   Patient Active Problem List   Diagnosis Date Noted   Chronic pain of both knees 08/25/2018   Generalized anxiety disorder with panic attacks 01/27/2018   Overweight (BMI 25.0-29.9) 01/27/2018   Family history of MI (myocardial infarction) 04/12/2017   Insomnia 11/23/2013   Premature atrial complexes 02/20/2013   Osteoarthritis of both knees 01/02/2013   Hyperlipidemia with target LDL less than 100 01/02/2013   Vitamin D deficiency 01/02/2013   Hypothyroidism 01/02/2013   Depressive disorder, not elsewhere classified 01/02/2013    REFERRING DIAG: M47.812  (ICD-10-CM) - Spondylosis without myelopathy or radiculopathy, cervical region   THERAPY DIAG:  Cervicalgia  Abnormal posture  Muscle weakness (generalized)  Rationale for Evaluation and Treatment Rehabilitation  PERTINENT HISTORY: Neck pain is ongoing. Will radiate into her head but not down her arms. Seeing a massage therapist for her neck and had physical therapy, dry needling and exercises. It really bothers her when turning to the left side and this is affecting her ability to drive. Tylenol doesn't really seem to help.   PRECAUTIONS: None   SUBJECTIVE:  SUBJECTIVE STATEMENT:  Pain much less and more mobility with turning her head.   PAIN:  Are you having pain?  Yes: NPRS scale: 3/10 Pain location: L side of neck Pain description: ache  Aggravating factors: sleep positions Relieving factors: heat   OBJECTIVE: (objective measures completed at initial evaluation unless otherwise dated)  DIAGNOSTIC FINDINGS:     PATIENT SURVEYS:  FOTO 53(65 predicted)   POSTURE: rounded shoulders and forward head   PALPATION: Marked tenderness to L scalene group       CERVICAL ROM:    Active ROM A/PROM (deg) eval 01/05/23  Flexion 75%   Extension 25%   Right lateral flexion 10% 10%/25%(post DN)  Left lateral flexion 10% 25%  Right rotation 50% 75%  Left rotation 50%  50%(75% post DN)   (Blank rows = not tested)   CERVICAL SPECIAL TESTS:  Neck flexor muscle endurance test: Negative 30s hold   FUNCTIONAL TESTS:  See special tests   TREATMENT: [facility] Adult PT Treatment:                                                DATE: 02/01/23 Therapeutic Exercise: B UT stretch arm b/h back 30s x2 B Supine OH flexion with inspiration 10x Seated hor abd YTB 15x Seated ER YTB 15x Manual Therapy: Manual scalene stretch 30s hold to all 3 heads, B Skilled palpation to identify taught bands in splenius cervicis/capitus B  Trigger Point Dry Needling Treatment: Pre-treatment instruction:  Patient instructed on dry needling rationale, procedures, and possible side effects including pain during treatment (achy,cramping feeling), bruising, drop of blood, lightheadedness, nausea, sweating. Patient Consent Given: Yes Education handout provided: Yes Muscles treated: L spenius cervicic/capitus  Needle size and number: .30x29mm x 2 Electrical stimulation performed: No Parameters: N/A Treatment response/outcome: Twitch response elicited, Palpable decrease in muscle tension, and Increased ROM I c-spine  Post-treatment instructions: Patient instructed to expect possible mild to moderate muscle soreness later today and/or tomorrow. Patient instructed in methods to reduce muscle soreness and to continue prescribed HEP. If patient was dry needled over the lung field, patient was instructed on signs and symptoms of pneumothorax and, however unlikely, to see immediate medical attention should they occur. Patient was also educated on signs and symptoms of infection and to seek medical attention should they occur. Patient verbalized understanding of these instructions and education.   [facility] Adult PT Treatment:                                                DATE: 01/25/23 Therapeutic Exercise: L UT Stretch 30s x2 (hands b/h back) L UT stretch 30s x2 (hand at table edge) Seated hor abd YTB 15x Seated ER YTB 15x Manual Therapy: Skilled palpation to identify taught bands in splenius cervicis/capitus in supine  Trigger Point Dry Needling Treatment: Pre-treatment instruction: Patient instructed on dry needling rationale, procedures, and possible side effects including pain during treatment (achy,cramping feeling), bruising, drop of blood, lightheadedness, nausea, sweating. Patient Consent Given: Yes Education handout provided: Yes Muscles treated: L spenius cervicic/capitus  Needle size and number: .30x8mm x 1 and .25x15mm x 1 Electrical stimulation performed: No Parameters: N/A Treatment  response/outcome: Twitch response elicited, Palpable decrease in muscle tension, and Increased ROM I c-spine  Post-treatment instructions: Patient instructed to expect possible mild to moderate muscle soreness later today and/or tomorrow. Patient instructed in methods to reduce muscle soreness and to continue prescribed HEP. If patient was dry needled over the lung field, patient was instructed on signs and symptoms of pneumothorax and, however unlikely, to see immediate medical attention should they occur. Patient was also educated on signs and symptoms of infection and to seek medical attention should they occur. Patient verbalized understanding of these instructions and education.   [facility] Adult PT Treatment:  DATE: 01/20/2023 Therapeutic Exercise: UBE lvl 1.5 x 4 min while taking subjective Supine chin tuck 2x10 - 3" hold Supine horizontal abd 2x10 GTB Standing row 2x10 BTB Manual Therapy: Skilled palpation of triggers points for TPDN STM to L upper trap Positional release to L upper trap Suboccipital release Trigger Point Dry Needling Treatment: Pre-treatment instruction: Patient instructed on dry needling rationale, procedures, and possible side effects including pain during treatment (achy,cramping feeling), bruising, drop of blood, lightheadedness, nausea, sweating. Patient Consent Given: Yes Education handout provided: No Muscles treated: bilateral upper trap  Needle size and number: .30x78mm x 3 Electrical stimulation performed: No Parameters: N/A Treatment response/outcome: Twitch response elicited and Palpable decrease in muscle tension Post-treatment instructions: Patient instructed to expect possible mild to moderate muscle soreness later today and/or tomorrow. Patient instructed in methods to reduce muscle soreness and to continue prescribed HEP. If patient was dry needled over the lung field, patient was instructed on signs and  symptoms of pneumothorax and, however unlikely, to see immediate medical attention should they occur. Patient was also educated on signs and symptoms of infection and to seek medical attention should they occur. Patient verbalized understanding of these instructions and education.   [facility] Adult PT Treatment:                                                DATE: 01/18/2023 Therapeutic Exercise: UBE lvl 1.0 x 3 min while taking subjective Supine chin tuck 2x10 - 3" hold Seated bilateral ER 2x10 RTB Seated horizontal abd 2x10 RTB Manual Therapy: Skilled palpation of triggers points for TPDN STM to L upper trap Positional release to L upper trap Suboccipital release Trigger Point Dry Needling Treatment: Pre-treatment instruction: Patient instructed on dry needling rationale, procedures, and possible side effects including pain during treatment (achy,cramping feeling), bruising, drop of blood, lightheadedness, nausea, sweating. Patient Consent Given: Yes Education handout provided: No Muscles treated: left upper trap  Needle size and number: .30x70mm x 2 Electrical stimulation performed: No Parameters: N/A Treatment response/outcome: Twitch response elicited and Palpable decrease in muscle tension Post-treatment instructions: Patient instructed to expect possible mild to moderate muscle soreness later today and/or tomorrow. Patient instructed in methods to reduce muscle soreness and to continue prescribed HEP. If patient was dry needled over the lung field, patient was instructed on signs and symptoms of pneumothorax and, however unlikely, to see immediate medical attention should they occur. Patient was also educated on signs and symptoms of infection and to seek medical attention should they occur. Patient verbalized understanding of these instructions and education.    PATIENT EDUCATION:  Education details: Discussed eval findings, rehab rationale and POC and patient is in agreement  Person  educated: Patient Education method: Explanation Education comprehension: verbalized understanding and needs further education   HOME EXERCISE PROGRAM: Access Code: [email or web address] URL: [email or web address] Date: 02/01/2023 Prepared by: Gustavus Bryant  Exercises - Seated Passive Cervical Retraction  - 2 x daily - 3 sets - 10 reps - 2 seconds hold - Shoulder External Rotation and Scapular Retraction with Resistance  - 2 x daily - 7 x weekly - 2 sets - 15 reps - red band hold - Seated Shoulder Horizontal Abduction with Resistance - Palms Down  - 1 x daily - 5 x weekly - 3 sets - 15 reps - 3s hold - Seated Upper Trap Stretch  -  1 x daily - 5 x weekly - 3 sets - 10 reps - 30s hold   ASSESSMENT:   CLINICAL IMPRESSION: Improving symptoms and mobility in cervical spine.  Added manual scalene stretches with PROM in SB noted ~50% B.  Patient only able to demo 25% AROM into B SB.  Added additional postural correction exercises and reviewed HEP for accuracy.   OBJECTIVE IMPAIRMENTS: decreased activity tolerance, decreased balance, decreased endurance, decreased knowledge of condition, decreased mobility, decreased ROM, dizziness, hypomobility, impaired perceived functional ability, increased muscle spasms, impaired flexibility, postural dysfunction, and pain.    ACTIVITY LIMITATIONS: carrying, lifting, sleeping, and golf   PERSONAL FACTORS: Age, Past/current experiences, and Time since onset of injury/illness/exacerbation are also affecting patient's functional outcome.    GOALS: Goals reviewed with patient? No   SHORT TERM GOALS: Target date: 01/20/2023   Patient to demonstrate independence in HEP  Baseline: [id number] Goal status: INITIAL   2.  Increase B SB to 25% Baseline:  Active ROM A/PROM (deg) eval  Flexion 75%  Extension 25%  Right lateral flexion 10%  Left lateral flexion 10%  Right rotation 50%  Left rotation 50%    Goal status: INITIAL       LONG TERM GOALS:  Target date: 02/10/2023   Decrease worst pain to 4/10 Baseline: 6/10 Goal status: INITIAL   2.  Increase cervical mobility to 66% Baseline:  Active ROM A/PROM (deg) eval  Flexion 75%  Extension 25%  Right lateral flexion 10%  Left lateral flexion 10%  Right rotation 50%  Left rotation 50%    Goal status: INITIAL   3.  Increase FOTO score to 65 Baseline: 53 Goal status: INITIAL   4.  Patient to report minimal restriction to participating in golf Baseline: Unable to swing w/o L side neck pain Goal status: INITIAL     PLAN:   PT FREQUENCY: 2x/week   PT DURATION: 6 weeks   PLANNED INTERVENTIONS: Therapeutic exercises, Therapeutic activity, Neuromuscular re-education, Balance training, Gait training, Patient/Family education, Self Care, Joint mobilization, Dry Needling, Manual therapy, and Re-evaluation   PLAN FOR NEXT SESSION: HEP review and update, posture retraining, cervical ROM, manual as needed, TPDN, upper back strengthening   Hildred Laser, PT 02/01/2023, 3:41 PM

## 2023-02-01 ENCOUNTER — Ambulatory Visit: Payer: Medicare Other

## 2023-02-01 DIAGNOSIS — R293 Abnormal posture: Secondary | ICD-10-CM

## 2023-02-01 DIAGNOSIS — M542 Cervicalgia: Secondary | ICD-10-CM | POA: Diagnosis not present

## 2023-02-01 DIAGNOSIS — M6281 Muscle weakness (generalized): Secondary | ICD-10-CM | POA: Diagnosis not present

## 2023-02-02 DIAGNOSIS — K080 Exfoliation of teeth due to systemic causes: Secondary | ICD-10-CM | POA: Diagnosis not present

## 2023-02-08 ENCOUNTER — Ambulatory Visit: Payer: Medicare Other

## 2023-02-10 ENCOUNTER — Ambulatory Visit: Payer: Medicare Other

## 2023-02-15 ENCOUNTER — Ambulatory Visit: Payer: Medicare Other | Attending: Neurosurgery

## 2023-02-15 DIAGNOSIS — M542 Cervicalgia: Secondary | ICD-10-CM | POA: Diagnosis not present

## 2023-02-15 DIAGNOSIS — R293 Abnormal posture: Secondary | ICD-10-CM | POA: Diagnosis not present

## 2023-02-15 DIAGNOSIS — M6281 Muscle weakness (generalized): Secondary | ICD-10-CM | POA: Insufficient documentation

## 2023-02-15 NOTE — Therapy (Signed)
OUTPATIENT PHYSICAL THERAPY TREATMENT NOTE   Patient Name: Patricia Cantu MRN: [id number] DOB:[birth year], 82 y.o., female Today's Date: 02/15/2023  PCP: [name], NP  REFERRING PROVIDER: Lisbeth Renshaw, MD   END OF SESSION:   PT End of Session - 02/15/23 1614     Visit Number 6    Number of Visits 12    Date for PT Re-Evaluation 02/24/23    Authorization Type [employer]    PT Start Time 1615    PT Stop Time 1654    PT Time Calculation (min) 39 min    Activity Tolerance Patient tolerated treatment well    Behavior During Therapy WFL for tasks assessed/performed                Past Medical History:  Diagnosis Date   Acute perichondritis of pinna    Allergic rhinitis due to pollen    Disorder of bone and cartilage, unspecified    Encounter for long-term (current) use of other medications    History of echocardiogram    Echo 9/18: EF 65-70   History of exercise stress test 06/2017   ETT 9/18: no ischemia; hypertensive BP response   HLD (hyperlipidemia)    Osteoarthritis    Panic disorder    Unspecified dermatitis due to sun    Unspecified hypothyroidism    Past Surgical History:  Procedure Laterality Date   BREAST BIOPSY Right 07/14/2016   BREAST BIOPSY Right 07/15/2016   cataract surgery Bilateral 10/12/2016   childbirth     x2 "blocks"   COLONOSCOPY     x2   TONSILLECTOMY  1949   removed   Patient Active Problem List   Diagnosis Date Noted   Chronic pain of both knees 08/25/2018   Generalized anxiety disorder with panic attacks 01/27/2018   Overweight (BMI 25.0-29.9) 01/27/2018   Family history of MI (myocardial infarction) 04/12/2017   Insomnia 11/23/2013   Premature atrial complexes 02/20/2013   Osteoarthritis of both knees 01/02/2013   Hyperlipidemia with target LDL less than 100 01/02/2013   Vitamin D deficiency 01/02/2013   Hypothyroidism 01/02/2013   Depressive disorder, not elsewhere classified 01/02/2013    REFERRING DIAG:  M47.812 (ICD-10-CM) - Spondylosis without myelopathy or radiculopathy, cervical region   THERAPY DIAG:  Cervicalgia  Abnormal posture  Rationale for Evaluation and Treatment Rehabilitation  PERTINENT HISTORY: Neck pain is ongoing. Will radiate into her head but not down her arms. Seeing a massage therapist for her neck and had physical therapy, dry needling and exercises. It really bothers her when turning to the left side and this is affecting her ability to drive. Tylenol doesn't really seem to help.   PRECAUTIONS: None   SUBJECTIVE:  SUBJECTIVE STATEMENT:  Pt presents to PT with reports of improving in neck symptoms. Has been compliant with HEP with no adverse effect.    PAIN:  Are you having pain?  Yes: NPRS scale: 3/10 Pain location: L side of neck Pain description: ache  Aggravating factors: sleep positions Relieving factors: heat   OBJECTIVE: (objective measures completed at initial evaluation unless otherwise dated)  DIAGNOSTIC FINDINGS:     PATIENT SURVEYS:  FOTO 53(65 predicted)   POSTURE: rounded shoulders and forward head   PALPATION: Marked tenderness to L scalene group       CERVICAL ROM:    Active ROM A/PROM (deg) eval 01/05/23  Flexion 75%   Extension 25%   Right lateral flexion 10% 10%/25%(post DN)  Left lateral flexion 10% 25%  Right rotation 50% 75%  Left rotation 50% 50%(75% post DN)   (Blank rows = not tested)   CERVICAL SPECIAL TESTS:  Neck flexor muscle endurance test: Negative 30s hold   FUNCTIONAL TESTS:  See special tests   TREATMENT: [facility] Adult PT Treatment:                                                DATE: 02/15/2023 Therapeutic Exercise: UBE lvl 1.5 x 4 min while taking subjective Supine chin tuck 2x10 - 3" hold Standing row 2x10 - 20# Standing shoulder ext 20# 2x10 Standing horizontal abd 2x10 RTB Standing bilateral ER 2x10 RTB Manual Therapy: Skilled palpation of triggers points for TPDN STM to L upper  trap Positional release to L upper trap Suboccipital release Trigger Point Dry Needling Treatment: Pre-treatment instruction: Patient instructed on dry needling rationale, procedures, and possible side effects including pain during treatment (achy,cramping feeling), bruising, drop of blood, lightheadedness, nausea, sweating. Patient Consent Given: Yes Education handout provided: No Muscles treated: bilateral upper trap  Needle size and number: .30x60mm x 3 Electrical stimulation performed: No Parameters: N/A Treatment response/outcome: Twitch response elicited and Palpable decrease in muscle tension Post-treatment instructions: Patient instructed to expect possible mild to moderate muscle soreness later today and/or tomorrow. Patient instructed in methods to reduce muscle soreness and to continue prescribed HEP. If patient was dry needled over the lung field, patient was instructed on signs and symptoms of pneumothorax and, however unlikely, to see immediate medical attention should they occur. Patient was also educated on signs and symptoms of infection and to seek medical attention should they occur. Patient verbalized understanding of these instructions and education.   [facility] Adult PT Treatment:                                                DATE: 02/01/23 Therapeutic Exercise: B UT stretch arm b/h back 30s x2 B Supine OH flexion with inspiration 10x Seated hor abd YTB 15x Seated ER YTB 15x Manual Therapy: Manual scalene stretch 30s hold to all 3 heads, B Skilled palpation to identify taught bands in splenius cervicis/capitus B  Trigger Point Dry Needling Treatment: Pre-treatment instruction: Patient instructed on dry needling rationale, procedures, and possible side effects including pain during treatment (achy,cramping feeling), bruising, drop of blood, lightheadedness, nausea, sweating. Patient Consent Given: Yes Education handout provided: Yes Muscles treated: L spenius  cervicic/capitus  Needle size and number: .30x55mm x 2 Electrical stimulation performed: No Parameters: N/A Treatment response/outcome: Twitch response elicited, Palpable decrease in muscle tension, and Increased ROM I c-spine  Post-treatment instructions: Patient instructed to expect possible mild to moderate muscle soreness later today and/or tomorrow. Patient instructed in methods to reduce muscle soreness and to continue prescribed HEP. If patient was dry needled over the lung field, patient was instructed on signs and symptoms of pneumothorax and, however unlikely, to see immediate medical attention should they occur. Patient was also educated on signs and symptoms of infection and to seek medical attention should they occur. Patient verbalized understanding of these instructions and education.   [facility] Adult PT Treatment:  DATE: 01/25/23 Therapeutic Exercise: L UT Stretch 30s x2 (hands b/h back) L UT stretch 30s x2 (hand at table edge) Seated hor abd YTB 15x Seated ER YTB 15x Manual Therapy: Skilled palpation to identify taught bands in splenius cervicis/capitus in supine  Trigger Point Dry Needling Treatment: Pre-treatment instruction: Patient instructed on dry needling rationale, procedures, and possible side effects including pain during treatment (achy,cramping feeling), bruising, drop of blood, lightheadedness, nausea, sweating. Patient Consent Given: Yes Education handout provided: Yes Muscles treated: L spenius cervicic/capitus  Needle size and number: .30x41mm x 1 and .25x43mm x 1 Electrical stimulation performed: No Parameters: N/A Treatment response/outcome: Twitch response elicited, Palpable decrease in muscle tension, and Increased ROM I c-spine  Post-treatment instructions: Patient instructed to expect possible mild to moderate muscle soreness later today and/or tomorrow. Patient instructed in methods to reduce muscle soreness and  to continue prescribed HEP. If patient was dry needled over the lung field, patient was instructed on signs and symptoms of pneumothorax and, however unlikely, to see immediate medical attention should they occur. Patient was also educated on signs and symptoms of infection and to seek medical attention should they occur. Patient verbalized understanding of these instructions and education.   [facility] Adult PT Treatment:                                                DATE: 01/20/2023 Therapeutic Exercise: UBE lvl 1.5 x 4 min while taking subjective Supine chin tuck 2x10 - 3" hold Supine horizontal abd 2x10 GTB Standing row 2x10 BTB Manual Therapy: Skilled palpation of triggers points for TPDN STM to L upper trap Positional release to L upper trap Suboccipital release Trigger Point Dry Needling Treatment: Pre-treatment instruction: Patient instructed on dry needling rationale, procedures, and possible side effects including pain during treatment (achy,cramping feeling), bruising, drop of blood, lightheadedness, nausea, sweating. Patient Consent Given: Yes Education handout provided: No Muscles treated: bilateral upper trap  Needle size and number: .30x78mm x 3 Electrical stimulation performed: No Parameters: N/A Treatment response/outcome: Twitch response elicited and Palpable decrease in muscle tension Post-treatment instructions: Patient instructed to expect possible mild to moderate muscle soreness later today and/or tomorrow. Patient instructed in methods to reduce muscle soreness and to continue prescribed HEP. If patient was dry needled over the lung field, patient was instructed on signs and symptoms of pneumothorax and, however unlikely, to see immediate medical attention should they occur. Patient was also educated on signs and symptoms of infection and to seek medical attention should they occur. Patient verbalized understanding of these instructions and education.   PATIENT EDUCATION:   Education details: Discussed eval findings, rehab rationale and POC and patient is in agreement  Person educated: Patient Education method: Explanation Education comprehension: verbalized understanding and needs further education   HOME EXERCISE PROGRAM: Access Code: [email or web address] URL: [email or web address] Date: 02/01/2023 Prepared by: Gustavus Bryant  Exercises - Seated Passive Cervical Retraction  - 2 x daily - 3 sets - 10 reps - 2 seconds hold - Shoulder External Rotation and Scapular Retraction with Resistance  - 2 x daily - 7 x weekly - 2 sets - 15 reps - red band hold - Seated Shoulder Horizontal Abduction with Resistance - Palms Down  - 1 x daily - 5 x weekly - 3 sets - 15 reps - 3s hold - Seated Upper Trap Stretch  -  1 x daily - 5 x weekly - 3 sets - 10 reps - 30s hold   ASSESSMENT:   CLINICAL IMPRESSION: Pt was able to complete all prescribed exercises with no adverse effect or increase in pain. Therapy focused on improving DNF and periscapular strength and manual therapy for decreasing pain. She responded well to manual and TPDN, noting decrease in pain post session. Pt is progressing with therapy, will continue per POC.    OBJECTIVE IMPAIRMENTS: decreased activity tolerance, decreased balance, decreased endurance, decreased knowledge of condition, decreased mobility, decreased ROM, dizziness, hypomobility, impaired perceived functional ability, increased muscle spasms, impaired flexibility, postural dysfunction, and pain.    ACTIVITY LIMITATIONS: carrying, lifting, sleeping, and golf   PERSONAL FACTORS: Age, Past/current experiences, and Time since onset of injury/illness/exacerbation are also affecting patient's functional outcome.    GOALS: Goals reviewed with patient? No   SHORT TERM GOALS: Target date: 01/20/2023   Patient to demonstrate independence in HEP  Baseline: [id number] Goal status: INITIAL   2.  Increase B SB to 25% Baseline:  Active ROM A/PROM  (deg) eval  Flexion 75%  Extension 25%  Right lateral flexion 10%  Left lateral flexion 10%  Right rotation 50%  Left rotation 50%    Goal status: INITIAL       LONG TERM GOALS: Target date: 02/10/2023   Decrease worst pain to 4/10 Baseline: 6/10 Goal status: INITIAL   2.  Increase cervical mobility to 66% Baseline:  Active ROM A/PROM (deg) eval  Flexion 75%  Extension 25%  Right lateral flexion 10%  Left lateral flexion 10%  Right rotation 50%  Left rotation 50%    Goal status: INITIAL   3.  Increase FOTO score to 65 Baseline: 53 Goal status: INITIAL   4.  Patient to report minimal restriction to participating in golf Baseline: Unable to swing w/o L side neck pain Goal status: INITIAL     PLAN:   PT FREQUENCY: 2x/week   PT DURATION: 6 weeks   PLANNED INTERVENTIONS: Therapeutic exercises, Therapeutic activity, Neuromuscular re-education, Balance training, Gait training, Patient/Family education, Self Care, Joint mobilization, Dry Needling, Manual therapy, and Re-evaluation   PLAN FOR NEXT SESSION: HEP review and update, posture retraining, cervical ROM, manual as needed, TPDN, upper back strengthening   Eloy End, PT 02/15/2023, 5:05 PM

## 2023-02-17 ENCOUNTER — Ambulatory Visit: Payer: Medicare Other

## 2023-02-17 DIAGNOSIS — R293 Abnormal posture: Secondary | ICD-10-CM | POA: Diagnosis not present

## 2023-02-17 DIAGNOSIS — M6281 Muscle weakness (generalized): Secondary | ICD-10-CM | POA: Diagnosis not present

## 2023-02-17 DIAGNOSIS — M542 Cervicalgia: Secondary | ICD-10-CM | POA: Diagnosis not present

## 2023-02-17 NOTE — Therapy (Signed)
OUTPATIENT PHYSICAL THERAPY TREATMENT NOTE   Patient Name: Patricia Cantu MRN: [id number] DOB:1941-02-17, 82 y.o., female Today's Date: 02/17/2023  PCP: [name], NP  REFERRING PROVIDER: Lisbeth Renshaw, MD   END OF SESSION:   PT End of Session - 02/17/23 1444     Visit Number 7    Number of Visits 12    Date for PT Re-Evaluation 02/24/23    Authorization Type [employer]    PT Start Time 1445    PT Stop Time 1525    PT Time Calculation (min) 40 min    Activity Tolerance Patient tolerated treatment well    Behavior During Therapy WFL for tasks assessed/performed                 Past Medical History:  Diagnosis Date   Acute perichondritis of pinna    Allergic rhinitis due to pollen    Disorder of bone and cartilage, unspecified    Encounter for long-term (current) use of other medications    History of echocardiogram    Echo 9/18: EF 65-70   History of exercise stress test 06/2017   ETT 9/18: no ischemia; hypertensive BP response   HLD (hyperlipidemia)    Osteoarthritis    Panic disorder    Unspecified dermatitis due to sun    Unspecified hypothyroidism    Past Surgical History:  Procedure Laterality Date   BREAST BIOPSY Right 07/14/2016   BREAST BIOPSY Right 07/15/2016   cataract surgery Bilateral 10/12/2016   childbirth     x2 "blocks"   COLONOSCOPY     x2   TONSILLECTOMY  1949   removed   Patient Active Problem List   Diagnosis Date Noted   Chronic pain of both knees 08/25/2018   Generalized anxiety disorder with panic attacks 01/27/2018   Overweight (BMI 25.0-29.9) 01/27/2018   Family history of MI (myocardial infarction) 04/12/2017   Insomnia 11/23/2013   Premature atrial complexes 02/20/2013   Osteoarthritis of both knees 01/02/2013   Hyperlipidemia with target LDL less than 100 01/02/2013   Vitamin D deficiency 01/02/2013   Hypothyroidism 01/02/2013   Depressive disorder, not elsewhere classified 01/02/2013    REFERRING DIAG:  M47.812 (ICD-10-CM) - Spondylosis without myelopathy or radiculopathy, cervical region   THERAPY DIAG:  Cervicalgia  Rationale for Evaluation and Treatment Rehabilitation  PERTINENT HISTORY: Neck pain is ongoing. Will radiate into her head but not down her arms. Seeing a massage therapist for her neck and had physical therapy, dry needling and exercises. It really bothers her when turning to the left side and this is affecting her ability to drive. Tylenol doesn't really seem to help.   PRECAUTIONS: None   SUBJECTIVE:  SUBJECTIVE STATEMENT:  Pt presents to PT with reports of improving in neck symptoms. Has been compliant with HEP with no adverse effect.    PAIN:  Are you having pain?  Yes: NPRS scale: 3/10 Pain location: L side of neck Pain description: ache  Aggravating factors: sleep positions Relieving factors: heat   OBJECTIVE: (objective measures completed at initial evaluation unless otherwise dated)  DIAGNOSTIC FINDINGS:     PATIENT SURVEYS:  FOTO 53(65 predicted)   POSTURE: rounded shoulders and forward head   PALPATION: Marked tenderness to L scalene group       CERVICAL ROM:    Active ROM A/PROM (deg) eval 01/05/23  Flexion 75%   Extension 25%   Right lateral flexion 10% 10%/25%(post DN)  Left lateral flexion 10% 25%  Right rotation  50% 75%  Left rotation 50% 50%(75% post DN)   (Blank rows = not tested)   CERVICAL SPECIAL TESTS:  Neck flexor muscle endurance test: Negative 30s hold   FUNCTIONAL TESTS:  See special tests   TREATMENT: [facility] Adult PT Treatment:                                                DATE: 02/17/2023 Therapeutic Exercise: UBE lvl 1.5 x 4 min while taking subjective Supine chin tuck 2x10 - 3" hold Seated horizontal abd 2x10 RTB Seated bilateral ER 2x10 RTB Manual Therapy: Skilled palpation of triggers points for TPDN STM to L upper trap Positional release to L upper trap Suboccipital release Trigger Point Dry Needling  Treatment: Pre-treatment instruction: Patient instructed on dry needling rationale, procedures, and possible side effects including pain during treatment (achy,cramping feeling), bruising, drop of blood, lightheadedness, nausea, sweating. Patient Consent Given: Yes Education handout provided: No Muscles treated: bilateral upper trap  Needle size and number: .30x44mm x 3 Electrical stimulation performed: No Parameters: N/A Treatment response/outcome: Twitch response elicited and Palpable decrease in muscle tension Post-treatment instructions: Patient instructed to expect possible mild to moderate muscle soreness later today and/or tomorrow. Patient instructed in methods to reduce muscle soreness and to continue prescribed HEP. If patient was dry needled over the lung field, patient was instructed on signs and symptoms of pneumothorax and, however unlikely, to see immediate medical attention should they occur. Patient was also educated on signs and symptoms of infection and to seek medical attention should they occur. Patient verbalized understanding of these instructions and education.   [facility] Adult PT Treatment:                                                DATE: 02/15/2023 Therapeutic Exercise: UBE lvl 1.5 x 4 min while taking subjective Supine chin tuck 2x10 - 3" hold Standing row 2x10 - 20# Standing shoulder ext 20# 2x10 Standing horizontal abd 2x10 RTB Standing bilateral ER 2x10 RTB Manual Therapy: Skilled palpation of triggers points for TPDN STM to L upper trap Positional release to L upper trap Suboccipital release Trigger Point Dry Needling Treatment: Pre-treatment instruction: Patient instructed on dry needling rationale, procedures, and possible side effects including pain during treatment (achy,cramping feeling), bruising, drop of blood, lightheadedness, nausea, sweating. Patient Consent Given: Yes Education handout provided: No Muscles treated: bilateral upper trap  Needle  size and number: .30x22mm x 3 Electrical stimulation performed: No Parameters: N/A Treatment response/outcome: Twitch response elicited and Palpable decrease in muscle tension Post-treatment instructions: Patient instructed to expect possible mild to moderate muscle soreness later today and/or tomorrow. Patient instructed in methods to reduce muscle soreness and to continue prescribed HEP. If patient was dry needled over the lung field, patient was instructed on signs and symptoms of pneumothorax and, however unlikely, to see immediate medical attention should they occur. Patient was also educated on signs and symptoms of infection and to seek medical attention should they occur. Patient verbalized understanding of these instructions and education.   [facility] Adult PT Treatment:  DATE: 02/01/23 Therapeutic Exercise: B UT stretch arm b/h back 30s x2 B Supine OH flexion with inspiration 10x Seated hor abd YTB 15x Seated ER YTB 15x Manual Therapy: Manual scalene stretch 30s hold to all 3 heads, B Skilled palpation to identify taught bands in splenius cervicis/capitus B  Trigger Point Dry Needling Treatment: Pre-treatment instruction: Patient instructed on dry needling rationale, procedures, and possible side effects including pain during treatment (achy,cramping feeling), bruising, drop of blood, lightheadedness, nausea, sweating. Patient Consent Given: Yes Education handout provided: Yes Muscles treated: L spenius cervicic/capitus  Needle size and number: .30x47mm x 2 Electrical stimulation performed: No Parameters: N/A Treatment response/outcome: Twitch response elicited, Palpable decrease in muscle tension, and Increased ROM I c-spine  Post-treatment instructions: Patient instructed to expect possible mild to moderate muscle soreness later today and/or tomorrow. Patient instructed in methods to reduce muscle soreness and to continue prescribed HEP. If  patient was dry needled over the lung field, patient was instructed on signs and symptoms of pneumothorax and, however unlikely, to see immediate medical attention should they occur. Patient was also educated on signs and symptoms of infection and to seek medical attention should they occur. Patient verbalized understanding of these instructions and education.   [facility] Adult PT Treatment:                                                DATE: 01/25/23 Therapeutic Exercise: L UT Stretch 30s x2 (hands b/h back) L UT stretch 30s x2 (hand at table edge) Seated hor abd YTB 15x Seated ER YTB 15x Manual Therapy: Skilled palpation to identify taught bands in splenius cervicis/capitus in supine  Trigger Point Dry Needling Treatment: Pre-treatment instruction: Patient instructed on dry needling rationale, procedures, and possible side effects including pain during treatment (achy,cramping feeling), bruising, drop of blood, lightheadedness, nausea, sweating. Patient Consent Given: Yes Education handout provided: Yes Muscles treated: L spenius cervicic/capitus  Needle size and number: .30x32mm x 1 and .25x15mm x 1 Electrical stimulation performed: No Parameters: N/A Treatment response/outcome: Twitch response elicited, Palpable decrease in muscle tension, and Increased ROM I c-spine  Post-treatment instructions: Patient instructed to expect possible mild to moderate muscle soreness later today and/or tomorrow. Patient instructed in methods to reduce muscle soreness and to continue prescribed HEP. If patient was dry needled over the lung field, patient was instructed on signs and symptoms of pneumothorax and, however unlikely, to see immediate medical attention should they occur. Patient was also educated on signs and symptoms of infection and to seek medical attention should they occur. Patient verbalized understanding of these instructions and education.   [facility] Adult PT Treatment:                                                 DATE: 01/20/2023 Therapeutic Exercise: UBE lvl 1.5 x 4 min while taking subjective Supine chin tuck 2x10 - 3" hold Supine horizontal abd 2x10 GTB Standing row 2x10 BTB Manual Therapy: Skilled palpation of triggers points for TPDN STM to L upper trap Positional release to L upper trap Suboccipital release Trigger Point Dry Needling Treatment: Pre-treatment instruction: Patient instructed on dry needling rationale, procedures, and possible side effects including pain during treatment (achy,cramping feeling), bruising, drop  of blood, lightheadedness, nausea, sweating. Patient Consent Given: Yes Education handout provided: No Muscles treated: bilateral upper trap  Needle size and number: .30x109mm x 3 Electrical stimulation performed: No Parameters: N/A Treatment response/outcome: Twitch response elicited and Palpable decrease in muscle tension Post-treatment instructions: Patient instructed to expect possible mild to moderate muscle soreness later today and/or tomorrow. Patient instructed in methods to reduce muscle soreness and to continue prescribed HEP. If patient was dry needled over the lung field, patient was instructed on signs and symptoms of pneumothorax and, however unlikely, to see immediate medical attention should they occur. Patient was also educated on signs and symptoms of infection and to seek medical attention should they occur. Patient verbalized understanding of these instructions and education.   PATIENT EDUCATION:  Education details: Discussed eval findings, rehab rationale and POC and patient is in agreement  Person educated: Patient Education method: Explanation Education comprehension: verbalized understanding and needs further education   HOME EXERCISE PROGRAM: Access Code: [email or web address] URL: [email or web address] Date: 02/01/2023 Prepared by: Gustavus Bryant  Exercises - Seated Passive Cervical Retraction  - 2 x daily - 3 sets - 10 reps  - 2 seconds hold - Shoulder External Rotation and Scapular Retraction with Resistance  - 2 x daily - 7 x weekly - 2 sets - 15 reps - red band hold - Seated Shoulder Horizontal Abduction with Resistance - Palms Down  - 1 x daily - 5 x weekly - 3 sets - 15 reps - 3s hold - Seated Upper Trap Stretch  - 1 x daily - 5 x weekly - 3 sets - 10 reps - 30s hold   ASSESSMENT:   CLINICAL IMPRESSION: Pt was able to complete all prescribed exercises with no adverse effect or increase in pain. Therapy focused on improving DNF and periscapular strength and manual therapy for decreasing pain. She responded well to manual and TPDN, noting decrease in pain post session. Pt is progressing with therapy, will continue per POC.    OBJECTIVE IMPAIRMENTS: decreased activity tolerance, decreased balance, decreased endurance, decreased knowledge of condition, decreased mobility, decreased ROM, dizziness, hypomobility, impaired perceived functional ability, increased muscle spasms, impaired flexibility, postural dysfunction, and pain.    ACTIVITY LIMITATIONS: carrying, lifting, sleeping, and golf   PERSONAL FACTORS: Age, Past/current experiences, and Time since onset of injury/illness/exacerbation are also affecting patient's functional outcome.    GOALS: Goals reviewed with patient? No   SHORT TERM GOALS: Target date: 01/20/2023   Patient to demonstrate independence in HEP  Baseline: [id number] Goal status: INITIAL   2.  Increase B SB to 25% Baseline:  Active ROM A/PROM (deg) eval  Flexion 75%  Extension 25%  Right lateral flexion 10%  Left lateral flexion 10%  Right rotation 50%  Left rotation 50%    Goal status: INITIAL       LONG TERM GOALS: Target date: 02/10/2023   Decrease worst pain to 4/10 Baseline: 6/10 Goal status: INITIAL   2.  Increase cervical mobility to 66% Baseline:  Active ROM A/PROM (deg) eval  Flexion 75%  Extension 25%  Right lateral flexion 10%  Left lateral flexion 10%   Right rotation 50%  Left rotation 50%    Goal status: INITIAL   3.  Increase FOTO score to 65 Baseline: 53 Goal status: INITIAL   4.  Patient to report minimal restriction to participating in golf Baseline: Unable to swing w/o L side neck pain Goal status: INITIAL     PLAN:   PT FREQUENCY: 2x/week  PT DURATION: 6 weeks   PLANNED INTERVENTIONS: Therapeutic exercises, Therapeutic activity, Neuromuscular re-education, Balance training, Gait training, Patient/Family education, Self Care, Joint mobilization, Dry Needling, Manual therapy, and Re-evaluation   PLAN FOR NEXT SESSION: HEP review and update, posture retraining, cervical ROM, manual as needed, TPDN, upper back strengthening   Eloy End, PT 02/17/2023, 5:00 PM

## 2023-02-22 ENCOUNTER — Ambulatory Visit: Payer: Medicare Other

## 2023-02-22 DIAGNOSIS — M542 Cervicalgia: Secondary | ICD-10-CM

## 2023-02-22 DIAGNOSIS — R293 Abnormal posture: Secondary | ICD-10-CM

## 2023-02-22 DIAGNOSIS — M6281 Muscle weakness (generalized): Secondary | ICD-10-CM | POA: Diagnosis not present

## 2023-02-22 NOTE — Therapy (Unsigned)
OUTPATIENT PHYSICAL THERAPY TREATMENT NOTE   Patient Name: Patricia Cantu MRN: [id number] DOB:[birth year], 82 y.o., female Today's Date: 02/17/2023  PCP: [name], NP  REFERRING PROVIDER: Lisbeth Renshaw, MD   END OF SESSION:   PT End of Session - 02/17/23 1444     Visit Number 7    Number of Visits 12    Date for PT Re-Evaluation 02/24/23    Authorization Type [employer]    PT Start Time 1445    PT Stop Time 1525    PT Time Calculation (min) 40 min    Activity Tolerance Patient tolerated treatment well    Behavior During Therapy WFL for tasks assessed/performed                 Past Medical History:  Diagnosis Date   Acute perichondritis of pinna    Allergic rhinitis due to pollen    Disorder of bone and cartilage, unspecified    Encounter for long-term (current) use of other medications    History of echocardiogram    Echo 9/18: EF 65-70   History of exercise stress test 06/2017   ETT 9/18: no ischemia; hypertensive BP response   HLD (hyperlipidemia)    Osteoarthritis    Panic disorder    Unspecified dermatitis due to sun    Unspecified hypothyroidism    Past Surgical History:  Procedure Laterality Date   BREAST BIOPSY Right 07/14/2016   BREAST BIOPSY Right 07/15/2016   cataract surgery Bilateral 10/12/2016   childbirth     x2 "blocks"   COLONOSCOPY     x2   TONSILLECTOMY  1949   removed   Patient Active Problem List   Diagnosis Date Noted   Chronic pain of both knees 08/25/2018   Generalized anxiety disorder with panic attacks 01/27/2018   Overweight (BMI 25.0-29.9) 01/27/2018   Family history of MI (myocardial infarction) 04/12/2017   Insomnia 11/23/2013   Premature atrial complexes 02/20/2013   Osteoarthritis of both knees 01/02/2013   Hyperlipidemia with target LDL less than 100 01/02/2013   Vitamin D deficiency 01/02/2013   Hypothyroidism 01/02/2013   Depressive disorder, not elsewhere classified 01/02/2013    REFERRING DIAG:  M47.812 (ICD-10-CM) - Spondylosis without myelopathy or radiculopathy, cervical region   THERAPY DIAG:  Cervicalgia  Rationale for Evaluation and Treatment Rehabilitation  PERTINENT HISTORY: Neck pain is ongoing. Will radiate into her head but not down her arms. Seeing a massage therapist for her neck and had physical therapy, dry needling and exercises. It really bothers her when turning to the left side and this is affecting her ability to drive. Tylenol doesn't really seem to help.   PRECAUTIONS: None   SUBJECTIVE:  SUBJECTIVE STATEMENT:  Pt presents to PT with reports of improving in neck symptoms. Has been compliant with HEP with no adverse effect.    PAIN:  Are you having pain?  Yes: NPRS scale: 3/10 Pain location: L side of neck Pain description: ache  Aggravating factors: sleep positions Relieving factors: heat   OBJECTIVE: (objective measures completed at initial evaluation unless otherwise dated)  DIAGNOSTIC FINDINGS:     PATIENT SURVEYS:  FOTO 53(65 predicted)   POSTURE: rounded shoulders and forward head   PALPATION: Marked tenderness to L scalene group       CERVICAL ROM:    Active ROM A/PROM (deg) eval 01/05/23  Flexion 75%   Extension 25%   Right lateral flexion 10% 10%/25%(post DN)  Left lateral flexion 10% 25%  Right rotation  50% 75%  Left rotation 50% 50%(75% post DN)   (Blank rows = not tested)   CERVICAL SPECIAL TESTS:  Neck flexor muscle endurance test: Negative 30s hold   FUNCTIONAL TESTS:  See special tests   TREATMENT: [facility] Adult PT Treatment:                                                DATE: 02/17/2023 Therapeutic Exercise: UBE lvl 1.5 x 4 min while taking subjective Supine chin tuck 2x10 - 3" hold Seated horizontal abd 2x10 RTB Seated bilateral ER 2x10 RTB Manual Therapy: Skilled palpation of triggers points for TPDN STM to L upper trap Positional release to L upper trap Suboccipital release Trigger Point Dry Needling  Treatment: Pre-treatment instruction: Patient instructed on dry needling rationale, procedures, and possible side effects including pain during treatment (achy,cramping feeling), bruising, drop of blood, lightheadedness, nausea, sweating. Patient Consent Given: Yes Education handout provided: No Muscles treated: bilateral upper trap  Needle size and number: .30x84mm x 3 Electrical stimulation performed: No Parameters: N/A Treatment response/outcome: Twitch response elicited and Palpable decrease in muscle tension Post-treatment instructions: Patient instructed to expect possible mild to moderate muscle soreness later today and/or tomorrow. Patient instructed in methods to reduce muscle soreness and to continue prescribed HEP. If patient was dry needled over the lung field, patient was instructed on signs and symptoms of pneumothorax and, however unlikely, to see immediate medical attention should they occur. Patient was also educated on signs and symptoms of infection and to seek medical attention should they occur. Patient verbalized understanding of these instructions and education.   [facility] Adult PT Treatment:                                                DATE: 02/15/2023 Therapeutic Exercise: UBE lvl 1.5 x 4 min while taking subjective Supine chin tuck 2x10 - 3" hold Standing row 2x10 - 20# Standing shoulder ext 20# 2x10 Standing horizontal abd 2x10 RTB Standing bilateral ER 2x10 RTB Manual Therapy: Skilled palpation of triggers points for TPDN STM to L upper trap Positional release to L upper trap Suboccipital release Trigger Point Dry Needling Treatment: Pre-treatment instruction: Patient instructed on dry needling rationale, procedures, and possible side effects including pain during treatment (achy,cramping feeling), bruising, drop of blood, lightheadedness, nausea, sweating. Patient Consent Given: Yes Education handout provided: No Muscles treated: bilateral upper trap  Needle  size and number: .30x26mm x 3 Electrical stimulation performed: No Parameters: N/A Treatment response/outcome: Twitch response elicited and Palpable decrease in muscle tension Post-treatment instructions: Patient instructed to expect possible mild to moderate muscle soreness later today and/or tomorrow. Patient instructed in methods to reduce muscle soreness and to continue prescribed HEP. If patient was dry needled over the lung field, patient was instructed on signs and symptoms of pneumothorax and, however unlikely, to see immediate medical attention should they occur. Patient was also educated on signs and symptoms of infection and to seek medical attention should they occur. Patient verbalized understanding of these instructions and education.   [facility] Adult PT Treatment:  DATE: 02/01/23 Therapeutic Exercise: B UT stretch arm b/h back 30s x2 B Supine OH flexion with inspiration 10x Seated hor abd YTB 15x Seated ER YTB 15x Manual Therapy: Manual scalene stretch 30s hold to all 3 heads, B Skilled palpation to identify taught bands in splenius cervicis/capitus B  Trigger Point Dry Needling Treatment: Pre-treatment instruction: Patient instructed on dry needling rationale, procedures, and possible side effects including pain during treatment (achy,cramping feeling), bruising, drop of blood, lightheadedness, nausea, sweating. Patient Consent Given: Yes Education handout provided: Yes Muscles treated: L spenius cervicic/capitus  Needle size and number: .30x55mm x 2 Electrical stimulation performed: No Parameters: N/A Treatment response/outcome: Twitch response elicited, Palpable decrease in muscle tension, and Increased ROM I c-spine  Post-treatment instructions: Patient instructed to expect possible mild to moderate muscle soreness later today and/or tomorrow. Patient instructed in methods to reduce muscle soreness and to continue prescribed HEP. If  patient was dry needled over the lung field, patient was instructed on signs and symptoms of pneumothorax and, however unlikely, to see immediate medical attention should they occur. Patient was also educated on signs and symptoms of infection and to seek medical attention should they occur. Patient verbalized understanding of these instructions and education.   [facility] Adult PT Treatment:                                                DATE: 01/25/23 Therapeutic Exercise: L UT Stretch 30s x2 (hands b/h back) L UT stretch 30s x2 (hand at table edge) Seated hor abd YTB 15x Seated ER YTB 15x Manual Therapy: Skilled palpation to identify taught bands in splenius cervicis/capitus in supine  Trigger Point Dry Needling Treatment: Pre-treatment instruction: Patient instructed on dry needling rationale, procedures, and possible side effects including pain during treatment (achy,cramping feeling), bruising, drop of blood, lightheadedness, nausea, sweating. Patient Consent Given: Yes Education handout provided: Yes Muscles treated: L spenius cervicic/capitus  Needle size and number: .30x52mm x 1 and .25x56mm x 1 Electrical stimulation performed: No Parameters: N/A Treatment response/outcome: Twitch response elicited, Palpable decrease in muscle tension, and Increased ROM I c-spine  Post-treatment instructions: Patient instructed to expect possible mild to moderate muscle soreness later today and/or tomorrow. Patient instructed in methods to reduce muscle soreness and to continue prescribed HEP. If patient was dry needled over the lung field, patient was instructed on signs and symptoms of pneumothorax and, however unlikely, to see immediate medical attention should they occur. Patient was also educated on signs and symptoms of infection and to seek medical attention should they occur. Patient verbalized understanding of these instructions and education.   [facility] Adult PT Treatment:                                                 DATE: 01/20/2023 Therapeutic Exercise: UBE lvl 1.5 x 4 min while taking subjective Supine chin tuck 2x10 - 3" hold Supine horizontal abd 2x10 GTB Standing row 2x10 BTB Manual Therapy: Skilled palpation of triggers points for TPDN STM to L upper trap Positional release to L upper trap Suboccipital release Trigger Point Dry Needling Treatment: Pre-treatment instruction: Patient instructed on dry needling rationale, procedures, and possible side effects including pain during treatment (achy,cramping feeling), bruising, drop  of blood, lightheadedness, nausea, sweating. Patient Consent Given: Yes Education handout provided: No Muscles treated: bilateral upper trap  Needle size and number: .30x5mm x 3 Electrical stimulation performed: No Parameters: N/A Treatment response/outcome: Twitch response elicited and Palpable decrease in muscle tension Post-treatment instructions: Patient instructed to expect possible mild to moderate muscle soreness later today and/or tomorrow. Patient instructed in methods to reduce muscle soreness and to continue prescribed HEP. If patient was dry needled over the lung field, patient was instructed on signs and symptoms of pneumothorax and, however unlikely, to see immediate medical attention should they occur. Patient was also educated on signs and symptoms of infection and to seek medical attention should they occur. Patient verbalized understanding of these instructions and education.   PATIENT EDUCATION:  Education details: Discussed eval findings, rehab rationale and POC and patient is in agreement  Person educated: Patient Education method: Explanation Education comprehension: verbalized understanding and needs further education   HOME EXERCISE PROGRAM: Access Code: [email or web address] URL: [email or web address] Date: 02/01/2023 Prepared by: Gustavus Bryant  Exercises - Seated Passive Cervical Retraction  - 2 x daily - 3 sets - 10 reps  - 2 seconds hold - Shoulder External Rotation and Scapular Retraction with Resistance  - 2 x daily - 7 x weekly - 2 sets - 15 reps - red band hold - Seated Shoulder Horizontal Abduction with Resistance - Palms Down  - 1 x daily - 5 x weekly - 3 sets - 15 reps - 3s hold - Seated Upper Trap Stretch  - 1 x daily - 5 x weekly - 3 sets - 10 reps - 30s hold   ASSESSMENT:   CLINICAL IMPRESSION: Pt was able to complete all prescribed exercises with no adverse effect or increase in pain. Therapy focused on improving DNF and periscapular strength and manual therapy for decreasing pain. She responded well to manual and TPDN, noting decrease in pain post session. Pt is progressing with therapy, will continue per POC.    OBJECTIVE IMPAIRMENTS: decreased activity tolerance, decreased balance, decreased endurance, decreased knowledge of condition, decreased mobility, decreased ROM, dizziness, hypomobility, impaired perceived functional ability, increased muscle spasms, impaired flexibility, postural dysfunction, and pain.    ACTIVITY LIMITATIONS: carrying, lifting, sleeping, and golf   PERSONAL FACTORS: Age, Past/current experiences, and Time since onset of injury/illness/exacerbation are also affecting patient's functional outcome.    GOALS: Goals reviewed with patient? No   SHORT TERM GOALS: Target date: 01/20/2023   Patient to demonstrate independence in HEP  Baseline: [id number] Goal status: INITIAL   2.  Increase B SB to 25% Baseline:  Active ROM A/PROM (deg) eval  Flexion 75%  Extension 25%  Right lateral flexion 10%  Left lateral flexion 10%  Right rotation 50%  Left rotation 50%    Goal status: INITIAL       LONG TERM GOALS: Target date: 02/10/2023   Decrease worst pain to 4/10 Baseline: 6/10 Goal status: INITIAL   2.  Increase cervical mobility to 66% Baseline:  Active ROM A/PROM (deg) eval  Flexion 75%  Extension 25%  Right lateral flexion 10%  Left lateral flexion 10%   Right rotation 50%  Left rotation 50%    Goal status: INITIAL   3.  Increase FOTO score to 65 Baseline: 53 Goal status: INITIAL   4.  Patient to report minimal restriction to participating in golf Baseline: Unable to swing w/o L side neck pain Goal status: INITIAL     PLAN:   PT FREQUENCY: 2x/week  PT DURATION: 6 weeks   PLANNED INTERVENTIONS: Therapeutic exercises, Therapeutic activity, Neuromuscular re-education, Balance training, Gait training, Patient/Family education, Self Care, Joint mobilization, Dry Needling, Manual therapy, and Re-evaluation   PLAN FOR NEXT SESSION: HEP review and update, posture retraining, cervical ROM, manual as needed, TPDN, upper back strengthening   Eloy End, PT 02/17/2023, 5:00 PM

## 2023-02-22 NOTE — Therapy (Signed)
OUTPATIENT PHYSICAL THERAPY TREATMENT NOTE   Patient Name: Patricia Cantu MRN: [id number] DOB:24-Oct-1940, 82 y.o., female Today's Date: 02/22/2023  PCP: [name], NP  REFERRING PROVIDER: Lisbeth Renshaw, MD   END OF SESSION:   PT End of Session - 02/22/23 1529     Visit Number 8    Number of Visits 12    Date for PT Re-Evaluation 02/24/23    Authorization Type [employer]    PT Start Time 1530    PT Stop Time 1610    PT Time Calculation (min) 40 min    Activity Tolerance Patient tolerated treatment well    Behavior During Therapy WFL for tasks assessed/performed                  Past Medical History:  Diagnosis Date   Acute perichondritis of pinna    Allergic rhinitis due to pollen    Disorder of bone and cartilage, unspecified    Encounter for long-term (current) use of other medications    History of echocardiogram    Echo 9/18: EF 65-70   History of exercise stress test 06/2017   ETT 9/18: no ischemia; hypertensive BP response   HLD (hyperlipidemia)    Osteoarthritis    Panic disorder    Unspecified dermatitis due to sun    Unspecified hypothyroidism    Past Surgical History:  Procedure Laterality Date   BREAST BIOPSY Right 07/14/2016   BREAST BIOPSY Right 07/15/2016   cataract surgery Bilateral 10/12/2016   childbirth     x2 "blocks"   COLONOSCOPY     x2   TONSILLECTOMY  1949   removed   Patient Active Problem List   Diagnosis Date Noted   Chronic pain of both knees 08/25/2018   Generalized anxiety disorder with panic attacks 01/27/2018   Overweight (BMI 25.0-29.9) 01/27/2018   Family history of MI (myocardial infarction) 04/12/2017   Insomnia 11/23/2013   Premature atrial complexes 02/20/2013   Osteoarthritis of both knees 01/02/2013   Hyperlipidemia with target LDL less than 100 01/02/2013   Vitamin D deficiency 01/02/2013   Hypothyroidism 01/02/2013   Depressive disorder, not elsewhere classified 01/02/2013    REFERRING DIAG:  M47.812 (ICD-10-CM) - Spondylosis without myelopathy or radiculopathy, cervical region   THERAPY DIAG:  Cervicalgia  Abnormal posture  Rationale for Evaluation and Treatment Rehabilitation  PERTINENT HISTORY: See PMH  PRECAUTIONS: None   SUBJECTIVE:  SUBJECTIVE STATEMENT:  Pt presents to PT with reports of continued neck pain. Has been compliant with HEP with no adverse effect.    PAIN:  Are you having pain?  Yes: NPRS scale: 3/10 Pain location: L side of neck Pain description: ache  Aggravating factors: sleep positions Relieving factors: heat   OBJECTIVE: (objective measures completed at initial evaluation unless otherwise dated)  DIAGNOSTIC FINDINGS:     PATIENT SURVEYS:  FOTO 53(65 predicted)   POSTURE: rounded shoulders and forward head   PALPATION: Marked tenderness to L scalene group       CERVICAL ROM:    Active ROM A/PROM (deg) eval 01/05/23  Flexion 75%   Extension 25%   Right lateral flexion 10% 10%/25%(post DN)  Left lateral flexion 10% 25%  Right rotation 50% 75%  Left rotation 50% 50%(75% post DN)   (Blank rows = not tested)   CERVICAL SPECIAL TESTS:  Neck flexor muscle endurance test: Negative 30s hold   FUNCTIONAL TESTS:  See special tests   TREATMENT: [facility] Adult PT Treatment:  DATE: 02/22/2023 Therapeutic Exercise: UBE lvl 1.5 x 4 min while taking subjective Manual Therapy: Skilled palpation of triggers points for TPDN STM to L upper trap Positional release to L upper trap Suboccipital release Trigger Point Dry Needling Treatment: Pre-treatment instruction: Patient instructed on dry needling rationale, procedures, and possible side effects including pain during treatment (achy,cramping feeling), bruising, drop of blood, lightheadedness, nausea, sweating. Patient Consent Given: Yes Education handout provided: No Muscles treated: bilateral upper trap  Needle size and number: .30x64mm x  3 Electrical stimulation performed: No Parameters: N/A Treatment response/outcome: Twitch response elicited and Palpable decrease in muscle tension Post-treatment instructions: Patient instructed to expect possible mild to moderate muscle soreness later today and/or tomorrow. Patient instructed in methods to reduce muscle soreness and to continue prescribed HEP. If patient was dry needled over the lung field, patient was instructed on signs and symptoms of pneumothorax and, however unlikely, to see immediate medical attention should they occur. Patient was also educated on signs and symptoms of infection and to seek medical attention should they occur. Patient verbalized understanding of these instructions and education.  [facility] Adult PT Treatment:                                                DATE: 02/17/2023 Therapeutic Exercise: UBE lvl 1.5 x 4 min while taking subjective Supine chin tuck 2x10 - 3" hold Seated horizontal abd 2x10 RTB Seated bilateral ER 2x10 RTB Manual Therapy: Skilled palpation of triggers points for TPDN STM to L upper trap Positional release to L upper trap Suboccipital release Trigger Point Dry Needling Treatment: Pre-treatment instruction: Patient instructed on dry needling rationale, procedures, and possible side effects including pain during treatment (achy,cramping feeling), bruising, drop of blood, lightheadedness, nausea, sweating. Patient Consent Given: Yes Education handout provided: No Muscles treated: bilateral upper trap  Needle size and number: .30x38mm x 3 Electrical stimulation performed: No Parameters: N/A Treatment response/outcome: Twitch response elicited and Palpable decrease in muscle tension Post-treatment instructions: Patient instructed to expect possible mild to moderate muscle soreness later today and/or tomorrow. Patient instructed in methods to reduce muscle soreness and to continue prescribed HEP. If patient was dry needled over the lung  field, patient was instructed on signs and symptoms of pneumothorax and, however unlikely, to see immediate medical attention should they occur. Patient was also educated on signs and symptoms of infection and to seek medical attention should they occur. Patient verbalized understanding of these instructions and education.   [facility] Adult PT Treatment:                                                DATE: 02/15/2023 Therapeutic Exercise: UBE lvl 1.5 x 4 min while taking subjective Supine chin tuck 2x10 - 3" hold Standing row 2x10 - 20# Standing shoulder ext 20# 2x10 Standing horizontal abd 2x10 RTB Standing bilateral ER 2x10 RTB Manual Therapy: Skilled palpation of triggers points for TPDN STM to L upper trap Positional release to L upper trap Suboccipital release Trigger Point Dry Needling Treatment: Pre-treatment instruction: Patient instructed on dry needling rationale, procedures, and possible side effects including pain during treatment (achy,cramping feeling), bruising, drop of blood, lightheadedness, nausea, sweating. Patient Consent Given: Yes Education handout provided:  No Muscles treated: bilateral upper trap  Needle size and number: .30x80mm x 3 Electrical stimulation performed: No Parameters: N/A Treatment response/outcome: Twitch response elicited and Palpable decrease in muscle tension Post-treatment instructions: Patient instructed to expect possible mild to moderate muscle soreness later today and/or tomorrow. Patient instructed in methods to reduce muscle soreness and to continue prescribed HEP. If patient was dry needled over the lung field, patient was instructed on signs and symptoms of pneumothorax and, however unlikely, to see immediate medical attention should they occur. Patient was also educated on signs and symptoms of infection and to seek medical attention should they occur. Patient verbalized understanding of these instructions and education.   [facility] Adult PT  Treatment:                                                DATE: 02/01/23 Therapeutic Exercise: B UT stretch arm b/h back 30s x2 B Supine OH flexion with inspiration 10x Seated hor abd YTB 15x Seated ER YTB 15x Manual Therapy: Manual scalene stretch 30s hold to all 3 heads, B Skilled palpation to identify taught bands in splenius cervicis/capitus B  Trigger Point Dry Needling Treatment: Pre-treatment instruction: Patient instructed on dry needling rationale, procedures, and possible side effects including pain during treatment (achy,cramping feeling), bruising, drop of blood, lightheadedness, nausea, sweating. Patient Consent Given: Yes Education handout provided: Yes Muscles treated: L spenius cervicic/capitus  Needle size and number: .30x80mm x 2 Electrical stimulation performed: No Parameters: N/A Treatment response/outcome: Twitch response elicited, Palpable decrease in muscle tension, and Increased ROM I c-spine  Post-treatment instructions: Patient instructed to expect possible mild to moderate muscle soreness later today and/or tomorrow. Patient instructed in methods to reduce muscle soreness and to continue prescribed HEP. If patient was dry needled over the lung field, patient was instructed on signs and symptoms of pneumothorax and, however unlikely, to see immediate medical attention should they occur. Patient was also educated on signs and symptoms of infection and to seek medical attention should they occur. Patient verbalized understanding of these instructions and education.   PATIENT EDUCATION:  Education details: continue HEP Person educated: Patient Education method: Explanation Education comprehension: verbalized understanding and needs further education   HOME EXERCISE PROGRAM: Access Code: [email or web address] URL: [email or web address] Date: 02/01/2023 Prepared by: Gustavus Bryant  Exercises - Seated Passive Cervical Retraction  - 2 x daily - 3 sets - 10 reps - 2  seconds hold - Shoulder External Rotation and Scapular Retraction with Resistance  - 2 x daily - 7 x weekly - 2 sets - 15 reps - red band hold - Seated Shoulder Horizontal Abduction with Resistance - Palms Down  - 1 x daily - 5 x weekly - 3 sets - 15 reps - 3s hold - Seated Upper Trap Stretch  - 1 x daily - 5 x weekly - 3 sets - 10 reps - 30s hold   ASSESSMENT:   CLINICAL IMPRESSION: Pt was able to complete all prescribed exercises with no adverse effect or increase in pain. She responded well to manual and TPDN, noting decrease in pain post session. Pt is progressing with therapy, will continue per POC.   OBJECTIVE IMPAIRMENTS: decreased activity tolerance, decreased balance, decreased endurance, decreased knowledge of condition, decreased mobility, decreased ROM, dizziness, hypomobility, impaired perceived functional ability, increased muscle spasms, impaired flexibility, postural dysfunction, and pain.  ACTIVITY LIMITATIONS: carrying, lifting, sleeping, and golf   PERSONAL FACTORS: Age, Past/current experiences, and Time since onset of injury/illness/exacerbation are also affecting patient's functional outcome.    GOALS: Goals reviewed with patient? No   SHORT TERM GOALS: Target date: 01/20/2023   Patient to demonstrate independence in HEP  Baseline: [id number] Goal status: INITIAL   2.  Increase B SB to 25% Baseline:  Active ROM A/PROM (deg) eval  Flexion 75%  Extension 25%  Right lateral flexion 10%  Left lateral flexion 10%  Right rotation 50%  Left rotation 50%    Goal status: INITIAL       LONG TERM GOALS: Target date: 02/10/2023   Decrease worst pain to 4/10 Baseline: 6/10 Goal status: INITIAL   2.  Increase cervical mobility to 66% Baseline:  Active ROM A/PROM (deg) eval  Flexion 75%  Extension 25%  Right lateral flexion 10%  Left lateral flexion 10%  Right rotation 50%  Left rotation 50%    Goal status: INITIAL   3.  Increase FOTO score to  65 Baseline: 53 Goal status: INITIAL   4.  Patient to report minimal restriction to participating in golf Baseline: Unable to swing w/o L side neck pain Goal status: INITIAL     PLAN:   PT FREQUENCY: 2x/week   PT DURATION: 6 weeks   PLANNED INTERVENTIONS: Therapeutic exercises, Therapeutic activity, Neuromuscular re-education, Balance training, Gait training, Patient/Family education, Self Care, Joint mobilization, Dry Needling, Manual therapy, and Re-evaluation   PLAN FOR NEXT SESSION: HEP review and update, posture retraining, cervical ROM, manual as needed, TPDN, upper back strengthening   Eloy End, PT 02/22/2023, 4:12 PM

## 2023-02-23 ENCOUNTER — Other Ambulatory Visit: Payer: Self-pay | Admitting: Nurse Practitioner

## 2023-02-23 DIAGNOSIS — I10 Essential (primary) hypertension: Secondary | ICD-10-CM

## 2023-02-24 ENCOUNTER — Ambulatory Visit: Payer: Medicare Other

## 2023-02-24 DIAGNOSIS — M6281 Muscle weakness (generalized): Secondary | ICD-10-CM

## 2023-02-24 DIAGNOSIS — R293 Abnormal posture: Secondary | ICD-10-CM

## 2023-02-24 DIAGNOSIS — M542 Cervicalgia: Secondary | ICD-10-CM | POA: Diagnosis not present

## 2023-02-28 ENCOUNTER — Encounter: Payer: Self-pay | Admitting: Nurse Practitioner

## 2023-02-28 DIAGNOSIS — I10 Essential (primary) hypertension: Secondary | ICD-10-CM

## 2023-03-02 MED ORDER — LOSARTAN POTASSIUM 50 MG PO TABS: 50.0000 mg | ORAL_TABLET | Freq: Every day | ORAL | 1 refills | Status: DC

## 2023-03-02 NOTE — Therapy (Addendum)
OUTPATIENT PHYSICAL THERAPY TREATMENT NOTE   Patient Name: Patricia Cantu MRN: [id number] DOB:[birth year], 82 y.o., female Today's Date: 03/03/2023  PCP: [name], NP  REFERRING PROVIDER: Lisbeth Renshaw, MD   END OF SESSION:   PT End of Session - 03/03/23 1444     Visit Number 10    Number of Visits 12    Date for PT Re-Evaluation 02/24/23    Authorization Type BCBS    PT Start Time 1445    PT Stop Time 1525    PT Time Calculation (min) 40 min    Activity Tolerance Patient tolerated treatment well    Behavior During Therapy WFL for tasks assessed/performed                 Past Medical History:  Diagnosis Date   Acute perichondritis of pinna    Allergic rhinitis due to pollen    Disorder of bone and cartilage, unspecified    Encounter for long-term (current) use of other medications    History of echocardiogram    Echo 9/18: EF 65-70   History of exercise stress test 06/2017   ETT 9/18: no ischemia; hypertensive BP response   HLD (hyperlipidemia)    Osteoarthritis    Panic disorder    Unspecified dermatitis due to sun    Unspecified hypothyroidism    Past Surgical History:  Procedure Laterality Date   BREAST BIOPSY Right 07/14/2016   BREAST BIOPSY Right 07/15/2016   cataract surgery Bilateral 10/12/2016   childbirth     x2 "blocks"   COLONOSCOPY     x2   TONSILLECTOMY  1949   removed   Patient Active Problem List   Diagnosis Date Noted   Chronic pain of both knees 08/25/2018   Generalized anxiety disorder with panic attacks 01/27/2018   Overweight (BMI 25.0-29.9) 01/27/2018   Family history of MI (myocardial infarction) 04/12/2017   Insomnia 11/23/2013   Premature atrial complexes 02/20/2013   Osteoarthritis of both knees 01/02/2013   Hyperlipidemia with target LDL less than 100 01/02/2013   Vitamin D deficiency 01/02/2013   Hypothyroidism 01/02/2013   Depressive disorder, not elsewhere classified 01/02/2013    REFERRING DIAG:  M47.812 (ICD-10-CM) - Spondylosis without myelopathy or radiculopathy, cervical region   THERAPY DIAG:  Cervicalgia  Abnormal posture  Muscle weakness (generalized)  Rationale for Evaluation and Treatment Rehabilitation  PERTINENT HISTORY: Neck pain is ongoing. Will radiate into her head but not down her arms. Seeing a massage therapist for her neck and had physical therapy, dry needling and exercises. It really bothers her when turning to the left side and this is affecting her ability to drive. Tylenol doesn't really seem to help.   PRECAUTIONS: None   SUBJECTIVE:  SUBJECTIVE STATEMENT:  Pain remains at a minimal level.  Main concern is loss of full L cervical rotation.   PAIN:  Are you having pain?  Yes: NPRS scale: 1/10 Pain location: L side of neck Pain description: ache  Aggravating factors: sleep positions Relieving factors: heat   OBJECTIVE: (objective measures completed at initial evaluation unless otherwise dated)  DIAGNOSTIC FINDINGS:     PATIENT SURVEYS:  FOTO 53(65 predicted); 02/24/23 68   POSTURE: rounded shoulders and forward head   PALPATION: Marked tenderness to L scalene group       CERVICAL ROM:    Active ROM A/PROM (deg) eval 01/05/23 02/24/23  Flexion 75%  75%  Extension 25%  75%  Right lateral flexion 10% 10%/25%(post DN) 25%  Left lateral flexion 10% 25% 50%  Right rotation 50% 75% 75%  Left rotation 50% 50%(75% post DN) 50%   (Blank rows = not tested)   CERVICAL SPECIAL TESTS:  Neck flexor muscle endurance test: Negative 30s hold   FUNCTIONAL TESTS:  See special tests   TREATMENT: [facility] Adult PT Treatment:                                                DATE: 03/03/23 Therapeutic Exercise: Nustep L2 6 min Manual Therapy: STM to L scalene group NAGs to C7-4 into extension and L rotation Manual scalene stretch 60s hold each head in supine and sit  [facility] Adult PT Treatment:                                                DATE:  02/24/23 Therapeutic Exercise: Ube L1 6 min Re-assessment of AROM and goals progress Manual Therapy: STM and stretch and MFR to L scalene group Manual stretch to L scalenes 60s hold x3 L first rib mobilization and MWM into L shoulder flexion.  [facility] Adult PT Treatment:                                                DATE: 02/17/2023 Therapeutic Exercise: UBE lvl 1.5 x 4 min while taking subjective Supine chin tuck 2x10 - 3" hold Seated horizontal abd 2x10 RTB Seated bilateral ER 2x10 RTB Manual Therapy: Skilled palpation of triggers points for TPDN STM to L upper trap Positional release to L upper trap Suboccipital release Trigger Point Dry Needling Treatment: Pre-treatment instruction: Patient instructed on dry needling rationale, procedures, and possible side effects including pain during treatment (achy,cramping feeling), bruising, drop of blood, lightheadedness, nausea, sweating. Patient Consent Given: Yes Education handout provided: No Muscles treated: bilateral upper trap  Needle size and number: .30x44mm x 3 Electrical stimulation performed: No Parameters: N/A Treatment response/outcome: Twitch response elicited and Palpable decrease in muscle tension Post-treatment instructions: Patient instructed to expect possible mild to moderate muscle soreness later today and/or tomorrow. Patient instructed in methods to reduce muscle soreness and to continue prescribed HEP. If patient was dry needled over the lung field, patient was instructed on signs and symptoms of pneumothorax and, however unlikely, to see immediate medical attention should they occur. Patient was also educated on signs and symptoms of infection and to seek medical attention should they occur. Patient verbalized understanding of these instructions and education.   [facility] Adult PT Treatment:                                                DATE: 02/15/2023 Therapeutic Exercise: UBE lvl 1.5 x 4 min while taking  subjective Supine chin tuck 2x10 - 3" hold Standing row 2x10 - 20# Standing shoulder ext 20# 2x10 Standing horizontal abd 2x10 RTB Standing bilateral ER 2x10 RTB Manual Therapy: Skilled palpation of triggers points for TPDN STM to L upper trap Positional release  to L upper trap Suboccipital release Trigger Point Dry Needling Treatment: Pre-treatment instruction: Patient instructed on dry needling rationale, procedures, and possible side effects including pain during treatment (achy,cramping feeling), bruising, drop of blood, lightheadedness, nausea, sweating. Patient Consent Given: Yes Education handout provided: No Muscles treated: bilateral upper trap  Needle size and number: .30x45mm x 3 Electrical stimulation performed: No Parameters: N/A Treatment response/outcome: Twitch response elicited and Palpable decrease in muscle tension Post-treatment instructions: Patient instructed to expect possible mild to moderate muscle soreness later today and/or tomorrow. Patient instructed in methods to reduce muscle soreness and to continue prescribed HEP. If patient was dry needled over the lung field, patient was instructed on signs and symptoms of pneumothorax and, however unlikely, to see immediate medical attention should they occur. Patient was also educated on signs and symptoms of infection and to seek medical attention should they occur. Patient verbalized understanding of these instructions and education.   [facility] Adult PT Treatment:                                                DATE: 02/01/23 Therapeutic Exercise: B UT stretch arm b/h back 30s x2 B Supine OH flexion with inspiration 10x Seated hor abd YTB 15x Seated ER YTB 15x Manual Therapy: Manual scalene stretch 30s hold to all 3 heads, B Skilled palpation to identify taught bands in splenius cervicis/capitus B  Trigger Point Dry Needling Treatment: Pre-treatment instruction: Patient instructed on dry needling rationale, procedures,  and possible side effects including pain during treatment (achy,cramping feeling), bruising, drop of blood, lightheadedness, nausea, sweating. Patient Consent Given: Yes Education handout provided: Yes Muscles treated: L spenius cervicic/capitus  Needle size and number: .30x99mm x 2 Electrical stimulation performed: No Parameters: N/A Treatment response/outcome: Twitch response elicited, Palpable decrease in muscle tension, and Increased ROM I c-spine  Post-treatment instructions: Patient instructed to expect possible mild to moderate muscle soreness later today and/or tomorrow. Patient instructed in methods to reduce muscle soreness and to continue prescribed HEP. If patient was dry needled over the lung field, patient was instructed on signs and symptoms of pneumothorax and, however unlikely, to see immediate medical attention should they occur. Patient was also educated on signs and symptoms of infection and to seek medical attention should they occur. Patient verbalized understanding of these instructions and education.   [facility] Adult PT Treatment:                                                DATE: 01/25/23 Therapeutic Exercise: L UT Stretch 30s x2 (hands b/h back) L UT stretch 30s x2 (hand at table edge) Seated hor abd YTB 15x Seated ER YTB 15x Manual Therapy: Skilled palpation to identify taught bands in splenius cervicis/capitus in supine  Trigger Point Dry Needling Treatment: Pre-treatment instruction: Patient instructed on dry needling rationale, procedures, and possible side effects including pain during treatment (achy,cramping feeling), bruising, drop of blood, lightheadedness, nausea, sweating. Patient Consent Given: Yes Education handout provided: Yes Muscles treated: L spenius cervicic/capitus  Needle size and number: .30x26mm x 1 and .25x64mm x 1 Electrical stimulation performed: No Parameters: N/A Treatment response/outcome: Twitch response elicited, Palpable decrease in  muscle tension, and Increased ROM I c-spine  Post-treatment instructions:  Patient instructed to expect possible mild to moderate muscle soreness later today and/or tomorrow. Patient instructed in methods to reduce muscle soreness and to continue prescribed HEP. If patient was dry needled over the lung field, patient was instructed on signs and symptoms of pneumothorax and, however unlikely, to see immediate medical attention should they occur. Patient was also educated on signs and symptoms of infection and to seek medical attention should they occur. Patient verbalized understanding of these instructions and education.   [facility] Adult PT Treatment:                                                DATE: 01/20/2023 Therapeutic Exercise: UBE lvl 1.5 x 4 min while taking subjective Supine chin tuck 2x10 - 3" hold Supine horizontal abd 2x10 GTB Standing row 2x10 BTB Manual Therapy: Skilled palpation of triggers points for TPDN STM to L upper trap Positional release to L upper trap Suboccipital release Trigger Point Dry Needling Treatment: Pre-treatment instruction: Patient instructed on dry needling rationale, procedures, and possible side effects including pain during treatment (achy,cramping feeling), bruising, drop of blood, lightheadedness, nausea, sweating. Patient Consent Given: Yes Education handout provided: No Muscles treated: bilateral upper trap  Needle size and number: .30x69mm x 3 Electrical stimulation performed: No Parameters: N/A Treatment response/outcome: Twitch response elicited and Palpable decrease in muscle tension Post-treatment instructions: Patient instructed to expect possible mild to moderate muscle soreness later today and/or tomorrow. Patient instructed in methods to reduce muscle soreness and to continue prescribed HEP. If patient was dry needled over the lung field, patient was instructed on signs and symptoms of pneumothorax and, however unlikely, to see immediate medical  attention should they occur. Patient was also educated on signs and symptoms of infection and to seek medical attention should they occur. Patient verbalized understanding of these instructions and education.   PATIENT EDUCATION:  Education details: Discussed eval findings, rehab rationale and POC and patient is in agreement  Person educated: Patient Education method: Explanation Education comprehension: verbalized understanding and needs further education   HOME EXERCISE PROGRAM: Access Code: [email or web address] URL: [email or web address] Date: 02/01/2023 Prepared by: Gustavus Bryant  Exercises - Seated Passive Cervical Retraction  - 2 x daily - 3 sets - 10 reps - 2 seconds hold - Shoulder External Rotation and Scapular Retraction with Resistance  - 2 x daily - 7 x weekly - 2 sets - 15 reps - red band hold - Seated Shoulder Horizontal Abduction with Resistance - Palms Down  - 1 x daily - 5 x weekly - 3 sets - 15 reps - 3s hold - Seated Upper Trap Stretch  - 1 x daily - 5 x weekly - 3 sets - 10 reps - 30s hold   ASSESSMENT:   CLINICAL IMPRESSION: Today's session focused on manual stretch to L scalene group along with NAGs to promote cervical extension and L rotation.  L shoulder flexion unrestricted.   OBJECTIVE IMPAIRMENTS: decreased activity tolerance, decreased balance, decreased endurance, decreased knowledge of condition, decreased mobility, decreased ROM, dizziness, hypomobility, impaired perceived functional ability, increased muscle spasms, impaired flexibility, postural dysfunction, and pain.    ACTIVITY LIMITATIONS: carrying, lifting, sleeping, and golf   PERSONAL FACTORS: Age, Past/current experiences, and Time since onset of injury/illness/exacerbation are also affecting patient's functional outcome.    GOALS: Goals reviewed with patient? No   SHORT TERM GOALS:  Target date: 01/20/2023   Patient to demonstrate independence in HEP  Baseline: [id number] Goal status: Met    2.  Increase B SB to 25% Baseline:  Active ROM A/PROM (deg) eval 01/05/23 02/24/23  Flexion 75%  75%  Extension 25%  75%  Right lateral flexion 10% 10%/25%(post DN) 25%(50% post STM)  Left lateral flexion 10% 25% 50%  Right rotation 50% 75% 75%  Left rotation 50% 50%(75% post DN) 50%(75% post STM)    Goal status: Met following STM       LONG TERM GOALS: Target date: 02/10/2023   Decrease worst pain to 4/10 Baseline: 6/10 Goal status: INITIAL   2.  Increase cervical mobility to 66% Baseline:  Active ROM A/PROM (deg) eval 01/05/23 02/24/23  Flexion 75%  75%  Extension 25%  75%  Right lateral flexion 10% 10%/25%(post DN) 25%  Left lateral flexion 10% 25% 50%  Right rotation 50% 75% 75%  Left rotation 50% 50%(75% post DN) 50%    Goal status: Ongoing   3.  Increase FOTO score to 65 Baseline: 53; 02/24/23 68 Goal status: Met   4.  Patient to report minimal restriction to participating in golf Baseline: Unable to swing w/o L side neck pain Goal status: Met     PLAN:   PT FREQUENCY: 1x/week   PT DURATION: 4 weeks   PLANNED INTERVENTIONS: Therapeutic exercises, Therapeutic activity, Neuromuscular re-education, Balance training, Gait training, Patient/Family education, Self Care, Joint mobilization, Dry Needling, Manual therapy, and Re-evaluation   PLAN FOR NEXT SESSION: HEP review and update, posture retraining, cervical ROM, manual as needed, TPDN, upper back strengthening   Hildred Laser, PT 03/03/2023, 2:45 PM

## 2023-03-02 NOTE — Telephone Encounter (Signed)
Message routed to PCP Eubanks, Jessica K, NP  

## 2023-03-03 ENCOUNTER — Ambulatory Visit: Payer: Medicare Other

## 2023-03-03 DIAGNOSIS — R293 Abnormal posture: Secondary | ICD-10-CM | POA: Diagnosis not present

## 2023-03-03 DIAGNOSIS — M6281 Muscle weakness (generalized): Secondary | ICD-10-CM | POA: Diagnosis not present

## 2023-03-03 DIAGNOSIS — M542 Cervicalgia: Secondary | ICD-10-CM | POA: Diagnosis not present

## 2023-03-03 NOTE — Telephone Encounter (Signed)
Message routed to PCP Eubanks, Jessica K, NP  

## 2023-03-05 ENCOUNTER — Ambulatory Visit: Payer: Medicare Other

## 2023-03-05 DIAGNOSIS — R293 Abnormal posture: Secondary | ICD-10-CM

## 2023-03-05 DIAGNOSIS — M542 Cervicalgia: Secondary | ICD-10-CM | POA: Diagnosis not present

## 2023-03-05 DIAGNOSIS — M6281 Muscle weakness (generalized): Secondary | ICD-10-CM

## 2023-03-05 NOTE — Therapy (Signed)
OUTPATIENT PHYSICAL THERAPY TREATMENT NOTE   Patient Name: Patricia Cantu MRN: [id number] DOB:1941/03/25, 82 y.o., female Today's Date: 03/05/2023  PCP: [name], NP  REFERRING PROVIDER: Lisbeth Renshaw, MD   END OF SESSION:   PT End of Session - 03/05/23 1256     Visit Number 11    Number of Visits 12    Date for PT Re-Evaluation 02/24/23    Authorization Type BCBS    PT Start Time 1300    PT Stop Time 1340    PT Time Calculation (min) 40 min    Activity Tolerance Patient tolerated treatment well    Behavior During Therapy WFL for tasks assessed/performed                 Past Medical History:  Diagnosis Date   Acute perichondritis of pinna    Allergic rhinitis due to pollen    Disorder of bone and cartilage, unspecified    Encounter for long-term (current) use of other medications    History of echocardiogram    Echo 9/18: EF 65-70   History of exercise stress test 06/2017   ETT 9/18: no ischemia; hypertensive BP response   HLD (hyperlipidemia)    Osteoarthritis    Panic disorder    Unspecified dermatitis due to sun    Unspecified hypothyroidism    Past Surgical History:  Procedure Laterality Date   BREAST BIOPSY Right 07/14/2016   BREAST BIOPSY Right 07/15/2016   cataract surgery Bilateral 10/12/2016   childbirth     x2 "blocks"   COLONOSCOPY     x2   TONSILLECTOMY  1949   removed   Patient Active Problem List   Diagnosis Date Noted   Chronic pain of both knees 08/25/2018   Generalized anxiety disorder with panic attacks 01/27/2018   Overweight (BMI 25.0-29.9) 01/27/2018   Family history of MI (myocardial infarction) 04/12/2017   Insomnia 11/23/2013   Premature atrial complexes 02/20/2013   Osteoarthritis of both knees 01/02/2013   Hyperlipidemia with target LDL less than 100 01/02/2013   Vitamin D deficiency 01/02/2013   Hypothyroidism 01/02/2013   Depressive disorder, not elsewhere classified 01/02/2013    REFERRING DIAG:  M47.812 (ICD-10-CM) - Spondylosis without myelopathy or radiculopathy, cervical region   THERAPY DIAG:  Cervicalgia  Abnormal posture  Muscle weakness (generalized)  Rationale for Evaluation and Treatment Rehabilitation  PERTINENT HISTORY: Neck pain is ongoing. Will radiate into her head but not down her arms. Seeing a massage therapist for her neck and had physical therapy, dry needling and exercises. It really bothers her when turning to the left side and this is affecting her ability to drive. Tylenol doesn't really seem to help.   PRECAUTIONS: None   SUBJECTIVE:  SUBJECTIVE STATEMENT:  Feels cervical rotation has stabilized still has issues with L rotation.     PAIN:  Are you having pain?  Yes: NPRS scale: 1/10 Pain location: L side of neck Pain description: ache  Aggravating factors: sleep positions Relieving factors: heat   OBJECTIVE: (objective measures completed at initial evaluation unless otherwise dated)  DIAGNOSTIC FINDINGS:     PATIENT SURVEYS:  FOTO 53(65 predicted); 02/24/23 68   POSTURE: rounded shoulders and forward head   PALPATION: Marked tenderness to L scalene group       CERVICAL ROM:    Active ROM A/PROM (deg) eval 01/05/23 02/24/23  Flexion 75%  75%  Extension 25%  75%  Right lateral flexion 10% 10%/25%(post DN) 25%  Left lateral  flexion 10% 25% 50%  Right rotation 50% 75% 75%  Left rotation 50% 50%(75% post DN) 50%   (Blank rows = not tested)   CERVICAL SPECIAL TESTS:  Neck flexor muscle endurance test: Negative 30s hold   FUNCTIONAL TESTS:  See special tests   TREATMENT: [facility] Adult PT Treatment:                                                DATE: 03/05/23 Therapeutic Exercise: Nustep L4 6 min OH flexion in supine with stick focus on inspiration 10x Supine hor abd YTB 15x B 15/15 unilaterally Supine flexion alternating 1# 10/10 Open book 10/10  [facility] Adult PT Treatment:                                                DATE:  03/03/23 Therapeutic Exercise: Nustep L2 6 min Manual Therapy: STM to L scalene group NAGs to C7-4 into extension and L rotation Manual scalene stretch 60s hold each head in supine and sit  [facility] Adult PT Treatment:                                                DATE: 02/24/23 Therapeutic Exercise: Ube L1 6 min Re-assessment of AROM and goals progress Manual Therapy: STM and stretch and MFR to L scalene group Manual stretch to L scalenes 60s hold x3 L first rib mobilization and MWM into L shoulder flexion.  PATIENT EDUCATION:  Education details: Discussed eval findings, rehab rationale and POC and patient is in agreement  Person educated: Patient Education method: Explanation Education comprehension: verbalized understanding and needs further education   HOME EXERCISE PROGRAM: Access Code: [email or web address] URL: [email or web address] Date: 02/01/2023 Prepared by: Gustavus Bryant  Exercises - Seated Passive Cervical Retraction  - 2 x daily - 3 sets - 10 reps - 2 seconds hold - Shoulder External Rotation and Scapular Retraction with Resistance  - 2 x daily - 7 x weekly - 2 sets - 15 reps - red band hold - Seated Shoulder Horizontal Abduction with Resistance - Palms Down  - 1 x daily - 5 x weekly - 3 sets - 15 reps - 3s hold - Seated Upper Trap Stretch  - 1 x daily - 5 x weekly - 3 sets - 10 reps - 30s hold   ASSESSMENT:   CLINICAL IMPRESSION: Focus of today was self stretch and postural correction.  Incorporated thoracic mobility tasks for rotation and flexion including rib excursion through inspiration.  Good response noted.  HEP updated to reflect progress and remaining deficits.  Will return for 1 more session and DC    OBJECTIVE IMPAIRMENTS: decreased activity tolerance, decreased balance, decreased endurance, decreased knowledge of condition, decreased mobility, decreased ROM, dizziness, hypomobility, impaired perceived functional ability, increased muscle spasms, impaired  flexibility, postural dysfunction, and pain.    ACTIVITY LIMITATIONS: carrying, lifting, sleeping, and golf   PERSONAL FACTORS: Age, Past/current experiences, and Time since onset of injury/illness/exacerbation are also affecting patient's functional outcome.    GOALS: Goals reviewed with patient? No   SHORT TERM GOALS: Target date:  01/20/2023   Patient to demonstrate independence in HEP  Baseline: [id number] Goal status: Met   2.  Increase B SB to 25% Baseline:  Active ROM A/PROM (deg) eval 01/05/23 02/24/23  Flexion 75%  75%  Extension 25%  75%  Right lateral flexion 10% 10%/25%(post DN) 25%(50% post STM)  Left lateral flexion 10% 25% 50%  Right rotation 50% 75% 75%  Left rotation 50% 50%(75% post DN) 50%(75% post STM)    Goal status: Met following STM       LONG TERM GOALS: Target date: 02/10/2023   Decrease worst pain to 4/10 Baseline: 6/10 Goal status: INITIAL   2.  Increase cervical mobility to 66% Baseline:  Active ROM A/PROM (deg) eval 01/05/23 02/24/23  Flexion 75%  75%  Extension 25%  75%  Right lateral flexion 10% 10%/25%(post DN) 25%  Left lateral flexion 10% 25% 50%  Right rotation 50% 75% 75%  Left rotation 50% 50%(75% post DN) 50%    Goal status: Ongoing   3.  Increase FOTO score to 65 Baseline: 53; 02/24/23 68 Goal status: Met   4.  Patient to report minimal restriction to participating in golf Baseline: Unable to swing w/o L side neck pain Goal status: Met     PLAN:   PT FREQUENCY: 2x/week   PT DURATION: 6 weeks   PLANNED INTERVENTIONS: Therapeutic exercises, Therapeutic activity, Neuromuscular re-education, Balance training, Gait training, Patient/Family education, Self Care, Joint mobilization, Dry Needling, Manual therapy, and Re-evaluation   PLAN FOR NEXT SESSION: HEP review and update, posture retraining, cervical ROM, manual as needed, TPDN, upper back strengthening   Hildred Laser, PT 03/05/2023, 1:46 PM

## 2023-03-15 NOTE — Therapy (Unsigned)
OUTPATIENT PHYSICAL THERAPY TREATMENT NOTE/DC SUMMARY   Patient Name: Patricia Cantu MRN: [id number] DOB:Feb 16, 1941, 82 y.o., female Today's Date: 03/16/2023  PCP: [name], NP  REFERRING PROVIDER: Lisbeth Renshaw, MD  PHYSICAL THERAPY DISCHARGE SUMMARY  Visits from Start of Care: 12  Current functional level related to goals / functional outcomes: Goals met   Remaining deficits: Restricted L cervical rotation   Education / Equipment: HEP   Patient agrees to discharge. Patient goals were met. Patient is being discharged due to being pleased with the current functional level.  END OF SESSION:   PT End of Session - 03/16/23 1613     Visit Number 12    Number of Visits 12    Date for PT Re-Evaluation 02/24/23    Authorization Type BC[employer]    PT Start Time 1615    PT Stop Time 1655    PT Time Calculation (min) 40 min    Activity Tolerance Patient tolerated treatment well    Behavior During Therapy WFL for tasks assessed/performed                 Past Medical History:  Diagnosis Date   Acute perichondritis of pinna    Allergic rhinitis due to pollen    Disorder of bone and cartilage, unspecified    Encounter for long-term (current) use of other medications    History of echocardiogram    Echo 9/18: EF 65-70   History of exercise stress test 06/2017   ETT 9/18: no ischemia; hypertensive BP response   HLD (hyperlipidemia)    Osteoarthritis    Panic disorder    Unspecified dermatitis due to sun    Unspecified hypothyroidism    Past Surgical History:  Procedure Laterality Date   BREAST BIOPSY Right 07/14/2016   BREAST BIOPSY Right 07/15/2016   cataract surgery Bilateral 10/12/2016   childbirth     x2 "blocks"   COLONOSCOPY     x2   TONSILLECTOMY  1949   removed   Patient Active Problem List   Diagnosis Date Noted   Chronic pain of both knees 08/25/2018   Generalized anxiety disorder with panic attacks 01/27/2018   Overweight (BMI  25.0-29.9) 01/27/2018   Family history of MI (myocardial infarction) 04/12/2017   Insomnia 11/23/2013   Premature atrial complexes 02/20/2013   Osteoarthritis of both knees 01/02/2013   Hyperlipidemia with target LDL less than 100 01/02/2013   Vitamin D deficiency 01/02/2013   Hypothyroidism 01/02/2013   Depressive disorder, not elsewhere classified 01/02/2013    REFERRING DIAG: M47.812 (ICD-10-CM) - Spondylosis without myelopathy or radiculopathy, cervical region   THERAPY DIAG:  Cervicalgia  Abnormal posture  Muscle weakness (generalized)  Rationale for Evaluation and Treatment Rehabilitation  PERTINENT HISTORY: Neck pain is ongoing. Will radiate into her head but not down her arms. Seeing a massage therapist for her neck and had physical therapy, dry needling and exercises. It really bothers her when turning to the left side and this is affecting her ability to drive. Tylenol doesn't really seem to help.   PRECAUTIONS: None   SUBJECTIVE:  SUBJECTIVE STATEMENT:  Feels 90% improved regarding cervical mobility.     PAIN:  Are you having pain?  Yes: NPRS scale: 1/10 Pain location: L side of neck Pain description: ache  Aggravating factors: sleep positions Relieving factors: heat   OBJECTIVE: (objective measures completed at initial evaluation unless otherwise dated)  DIAGNOSTIC FINDINGS:     PATIENT SURVEYS:  FOTO 53(65 predicted); 02/24/23 68  POSTURE: rounded shoulders and forward head   PALPATION: Marked tenderness to L scalene group       CERVICAL ROM:    Active ROM A/PROM (deg) eval 01/05/23 02/24/23  Flexion 75%  75%  Extension 25%  75%  Right lateral flexion 10% 10%/25%(post DN) 25%  Left lateral flexion 10% 25% 50%  Right rotation 50% 75% 75%  Left rotation 50% 50%(75% post DN) 50%   (Blank rows = not tested)   CERVICAL SPECIAL TESTS:  Neck flexor muscle endurance test: Negative 30s hold   FUNCTIONAL TESTS:  See special tests    TREATMENT: [facility] Adult PT Treatment:                                                DATE: 03/16/23 Therapeutic Exercise: Nustep L4 6 min OH flexion in supine with stick focus on inspiration 10x Supine hor abd YTB 15x B 15/15 unilaterally Supine flexion alternating 2# 10/10 Manual scalene stretch 3 heads 30s L first rib mobilization with OH flexion  [facility] Adult PT Treatment:                                                DATE: 03/05/23 Therapeutic Exercise: Nustep L4 6 min OH flexion in supine with stick focus on inspiration 10x Supine hor abd YTB 15x B 15/15 unilaterally Supine flexion alternating 1# 10/10 Open book 10/10  [facility] Adult PT Treatment:                                                DATE: 03/03/23 Therapeutic Exercise: Nustep L2 6 min Manual Therapy: STM to L scalene group NAGs to C7-4 into extension and L rotation Manual scalene stretch 60s hold each head in supine and sit  [facility] Adult PT Treatment:                                                DATE: 02/24/23 Therapeutic Exercise: Ube L1 6 min Re-assessment of AROM and goals progress Manual Therapy: STM and stretch and MFR to L scalene group Manual stretch to L scalenes 60s hold x3 L first rib mobilization and MWM into L shoulder flexion.  PATIENT EDUCATION:  Education details: Discussed eval findings, rehab rationale and POC and patient is in agreement  Person educated: Patient Education method: Explanation Education comprehension: verbalized understanding and needs further education   HOME EXERCISE PROGRAM: Access Code: [email or web address] URL: [email or web address] Date: 02/01/2023 Prepared by: Gustavus Bryant  Exercises - Seated Passive Cervical Retraction  - 2 x daily - 3 sets - 10 reps - 2 seconds hold - Shoulder External Rotation and Scapular Retraction with Resistance  - 2 x daily - 7 x weekly - 2 sets - 15 reps - red band hold - Seated Shoulder Horizontal Abduction with Resistance - Palms Down  - 1  x daily - 5 x weekly - 3 sets - 15 reps - 3s hold - Seated Upper Trap Stretch  -  1 x daily - 5 x weekly - 3 sets - 10 reps - 30s hold   ASSESSMENT:   CLINICAL IMPRESSION: Rehab goals met or maximum function regained   OBJECTIVE IMPAIRMENTS: decreased activity tolerance, decreased balance, decreased endurance, decreased knowledge of condition, decreased mobility, decreased ROM, dizziness, hypomobility, impaired perceived functional ability, increased muscle spasms, impaired flexibility, postural dysfunction, and pain.    ACTIVITY LIMITATIONS: carrying, lifting, sleeping, and golf   PERSONAL FACTORS: Age, Past/current experiences, and Time since onset of injury/illness/exacerbation are also affecting patient's functional outcome.    GOALS: Goals reviewed with patient? No   SHORT TERM GOALS: Target date: 01/20/2023   Patient to demonstrate independence in HEP  Baseline: [id number] Goal status: Met   2.  Increase B SB to 25% Baseline:  Active ROM A/PROM (deg) eval 01/05/23 02/24/23  Flexion 75%  75%  Extension 25%  75%  Right lateral flexion 10% 10%/25%(post DN) 25%(50% post STM)  Left lateral flexion 10% 25% 50%  Right rotation 50% 75% 75%  Left rotation 50% 50%(75% post DN) 50%(75% post STM)    Goal status: Met following STM       LONG TERM GOALS: Target date: 04/12/2023   Decrease worst pain to 4/10 Baseline: 6/10; 03/16/23 2/10 Goal status: Met   2.  Increase cervical mobility to 66% Baseline:  Active ROM A/PROM (deg) eval 01/05/23 02/24/23  Flexion 75%  75%  Extension 25%  75%  Right lateral flexion 10% 10%/25%(post DN) 25%  Left lateral flexion 10% 25% 50%  Right rotation 50% 75% 75%  Left rotation 50% 50%(75% post DN) 50%    Goal status: Partially met   3.  Increase FOTO score to 65 Baseline: 53; 02/24/23 68 Goal status: Met   4.  Patient to report minimal restriction to participating in golf Baseline: Unable to swing w/o L side neck pain Goal status: Met      PLAN:   PT FREQUENCY: 1x/week   PT DURATION: 4 weeks   PLANNED INTERVENTIONS: Therapeutic exercises, Therapeutic activity, Neuromuscular re-education, Balance training, Gait training, Patient/Family education, Self Care, Joint mobilization, Dry Needling, Manual therapy, and Re-evaluation   PLAN FOR NEXT SESSION: HEP review and update, posture retraining, cervical ROM, manual as needed, TPDN, upper back strengthening   Hildred Laser, PT 03/16/2023, 5:01 PM

## 2023-03-16 ENCOUNTER — Ambulatory Visit: Payer: Medicare Other | Attending: Neurosurgery

## 2023-03-16 DIAGNOSIS — M542 Cervicalgia: Secondary | ICD-10-CM | POA: Insufficient documentation

## 2023-03-16 DIAGNOSIS — M6281 Muscle weakness (generalized): Secondary | ICD-10-CM | POA: Diagnosis not present

## 2023-03-16 DIAGNOSIS — R293 Abnormal posture: Secondary | ICD-10-CM | POA: Insufficient documentation

## 2023-03-16 NOTE — Addendum Note (Signed)
Addended by: Hildred Laser on: 03/16/2023 04:28 PM   Modules accepted: Orders

## 2023-03-19 ENCOUNTER — Encounter: Payer: Self-pay | Admitting: Nurse Practitioner

## 2023-03-19 ENCOUNTER — Ambulatory Visit (INDEPENDENT_AMBULATORY_CARE_PROVIDER_SITE_OTHER): Payer: Medicare Other | Admitting: Nurse Practitioner

## 2023-03-19 VITALS — BP 138/68 | HR 86 | Temp 97.2°F | Resp 16 | Ht 65.0 in | Wt 195.6 lb

## 2023-03-19 DIAGNOSIS — B3731 Acute candidiasis of vulva and vagina: Secondary | ICD-10-CM | POA: Diagnosis not present

## 2023-03-19 DIAGNOSIS — I10 Essential (primary) hypertension: Secondary | ICD-10-CM

## 2023-03-19 DIAGNOSIS — R011 Cardiac murmur, unspecified: Secondary | ICD-10-CM

## 2023-03-19 MED ORDER — MICONAZOLE NITRATE 2 % VA CREA
1.0000 | TOPICAL_CREAM | Freq: Every day | VAGINAL | 0 refills | Status: DC
Start: 2023-03-19 — End: 2023-05-10

## 2023-03-19 MED ORDER — FLUTICASONE PROPIONATE 50 MCG/ACT NA SUSP: 1.0000 | Freq: Every day | NASAL | 3 refills | Status: AC

## 2023-03-19 MED ORDER — FLUCONAZOLE 150 MG PO TABS
150.0000 mg | ORAL_TABLET | Freq: Every day | ORAL | 0 refills | Status: DC
Start: 2023-03-19 — End: 2023-05-10

## 2023-03-19 NOTE — Progress Notes (Signed)
Careteam: Patient Care Team: [name], NP as PCP - General (Geriatric Medicine)  PLACE OF SERVICE:  [facility]  Advanced Directive information Does Patient Have a Medical Advance Directive?: Yes, Type of Advance Directive: Healthcare Power of [place];Living will, Does patient want to make changes to medical advance directive?: No - Patient declined  Allergies  Allergen Reactions   Shellfish Allergy Diarrhea and Nausea And Vomiting    Chief Complaint  Patient presents with   Acute Visit    Patient is here to discuss Blood pressure check. Patient also has concerns of pubic area being irritated.      HPI: Patient is a 82 y.o. female  126/72 at home, mosty sbp 130s.on losartan 50 mg daily.   Redness in her pelvic area. Last week thought she was having a urinary infection due to burning with urination but that improved.  Does not itch. Just stings.  Sweats a lot when she plays golf.  Occasionally will leak urine.   Using tylenol for her neck pain.  Neck pain doing much better after dry needling.   No LE edema, no shortness of breath.  Review of Systems:  Review of Systems  Constitutional:  Negative for chills, fever and weight loss.  HENT:  Negative for tinnitus.   Respiratory:  Negative for cough, sputum production and shortness of breath.   Cardiovascular:  Negative for chest pain, palpitations and leg swelling.  Gastrointestinal:  Negative for abdominal pain, constipation, diarrhea and heartburn.  Genitourinary:  Negative for dysuria, frequency and urgency.  Musculoskeletal:  Negative for back pain, falls, joint pain and myalgias.  Skin:  Positive for rash.  Neurological:  Negative for dizziness and headaches.  Psychiatric/Behavioral:  Negative for depression and memory loss. The patient does not have insomnia.     Past Medical History:  Diagnosis Date   Acute perichondritis of pinna    Allergic rhinitis due to pollen    Disorder of bone and  cartilage, unspecified    Encounter for long-term (current) use of other medications    History of echocardiogram    Echo 9/18: EF 65-70   History of exercise stress test 06/2017   ETT 9/18: no ischemia; hypertensive BP response   HLD (hyperlipidemia)    Osteoarthritis    Panic disorder    Unspecified dermatitis due to sun    Unspecified hypothyroidism    Past Surgical History:  Procedure Laterality Date   BREAST BIOPSY Right 07/14/2016   BREAST BIOPSY Right 07/15/2016   cataract surgery Bilateral 10/12/2016   childbirth     x2 "blocks"   COLONOSCOPY     x2   TONSILLECTOMY  1949   removed   Social History:   reports that she has never smoked. She has never used smokeless tobacco. She reports current alcohol use of about 2.0 standard drinks of alcohol per week. She reports that she does not use drugs.  Family History  Problem Relation Age of Onset   Stroke Mother    Heart failure Mother 72   Hypertension Mother    Heart attack Father 58   Hyperlipidemia Sister    Atrial fibrillation Sister    Thyroid disease Sister    Atrial fibrillation Brother    Breast cancer Maternal Grandmother 22   Thyroid disease Daughter    Fibromyalgia Daughter    Heart disease Son    Thyroid disease Son    Thyroid disease Son    Colon cancer Neg Hx  Medications: Patient's Medications  New Prescriptions   No medications on file  Previous Medications   ACETAMINOPHEN (TYLENOL) 650 MG CR TABLET    Take 1,300 mg by mouth in the morning and at bedtime.   ATORVASTATIN (LIPITOR) 10 MG TABLET    TAKE 1 TABLET BY MOUTH DAILY FOR CHOLESTEROL   CALCIUM PO    Take 1,000 mg by mouth daily.    CHOLECALCIFEROL (VITAMIN D3) 125 MCG (5000 UT) CAPS    Take one capsule by mouth once daily.   CITALOPRAM (CELEXA) 20 MG TABLET    Take 1 tablet (20 mg total) by mouth daily.   DICLOFENAC SODIUM (VOLTAREN) 1 % GEL    Apply 4 g topically 4 (four) times daily as needed.   FAMOTIDINE (PEPCID) 10 MG TABLET     Take 1 tablet (10 mg total) by mouth 2 (two) times daily as needed for heartburn or indigestion.   FISH OIL-OMEGA-3 FATTY ACIDS 1000 MG CAPSULE    Take 1 g by mouth daily.    FLUTICASONE (FLONASE) 50 MCG/ACT NASAL SPRAY    Place 1 spray into both nostrils daily.   LEVOTHYROXINE (SYNTHROID) 50 MCG TABLET    TAKE 1 TABLET BY MOUTH ONCE DAILY 30 MINUTES PRIOR TO BREAKFAST FOR THYROID   LOSARTAN (COZAAR) 50 MG TABLET    Take 1 tablet (50 mg total) by mouth daily.   MULTIPLE VITAMIN (MULTIVITAMIN) TABLET    Take 1 tablet by mouth daily.   NYSTATIN CREAM (MYCOSTATIN)    Apply 1 application topically 2 (two) times daily as needed.   OMEPRAZOLE (PRILOSEC) 20 MG CAPSULE    Take 1 capsule (20 mg total) by mouth daily.   TRIAMCINOLONE CREAM (KENALOG) 0.1 %    Apply 1 application topically daily as needed (itching).   Modified Medications   No medications on file  Discontinued Medications   No medications on file    Physical Exam:  Vitals:   03/19/23 0957  BP: 138/68  Pulse: 86  Resp: 16  Temp: (!) 97.2 F (36.2 C)  SpO2: 94%  Weight: 195 lb 9.6 oz (88.7 kg)  Height: 5\' 5"  (1.651 m)   Body mass index is 32.55 kg/m. Wt Readings from Last 3 Encounters:  03/19/23 195 lb 9.6 oz (88.7 kg)  11/02/22 196 lb (88.9 kg)  09/17/22 196 lb 2 oz (89 kg)    Physical Exam Constitutional:      General: She is not in acute distress.    Appearance: She is well-developed. She is not diaphoretic.  HENT:     Head: Normocephalic and atraumatic.     Mouth/Throat:     Pharynx: No oropharyngeal exudate.  Eyes:     Conjunctiva/sclera: Conjunctivae normal.     Pupils: Pupils are equal, round, and reactive to light.  Cardiovascular:     Rate and Rhythm: Normal rate and regular rhythm.     Heart sounds: Murmur heard.  Pulmonary:     Effort: Pulmonary effort is normal.     Breath sounds: Normal breath sounds.  Abdominal:     General: Bowel sounds are normal.     Palpations: Abdomen is soft.   Genitourinary:    Vagina: Erythema (with white patches) and tenderness present.  Musculoskeletal:     Cervical back: Normal range of motion and neck supple.     Right lower leg: No edema.     Left lower leg: No edema.  Skin:    General: Skin is warm and dry.  Neurological:     Mental Status: She is alert.  Psychiatric:        Mood and Affect: Mood normal.    Labs reviewed: Basic Metabolic Panel: Recent Labs    04/22/22 0858 10/26/22 0832  NA 139 141  K 4.6 4.3  CL 105 104  CO2 22 27  GLUCOSE 91 89  BUN 22 20  CREATININE 0.99 0.89  CALCIUM 8.9 9.4  TSH 1.59  --    Liver Function Tests: Recent Labs    04/22/22 0858 10/26/22 0832  AST 19 17  ALT 14 14  BILITOT 0.7 0.6  PROT 6.4 6.5   No results for input(s): "LIPASE", "AMYLASE" in the last 8760 hours. No results for input(s): "AMMONIA" in the last 8760 hours. CBC: Recent Labs    04/22/22 0858 10/26/22 0832  WBC 5.8 5.9  NEUTROABS 2,987 3,103  HGB 14.2 13.8  HCT 41.9 40.6  MCV 94.2 92.7  PLT 284 274   Lipid Panel: Recent Labs    04/22/22 0858  CHOL 180  HDL 70  LDLCALC 87  TRIG 125  CHOLHDL 2.6   TSH: Recent Labs    04/22/22 0858  TSH 1.59   A1C: Lab Results  Component Value Date   HGBA1C 5.2 03/20/2020     Assessment/Plan 1. Vaginal yeast infection - fluconazole (DIFLUCAN) 150 MG tablet; Take 1 tablet (150 mg total) by mouth daily.  Dispense: 2 tablet; Refill: 0 - miconazole (MONISTAT 7) 2 % vaginal cream; Place 1 Applicatorful vaginally at bedtime.  Dispense: 45 g; Refill: 0 To notify if symptoms worsen or fail to improve.   2. Primary hypertension -Blood pressure well controlled, goal bp <140/90 Continue current medications and dietary modifications follow metabolic panel - Complete Metabolic Panel with eGFR  3. Murmur Noted today will get echo for further evaluation at this time No shortness of breath or edema noted.  - ECHOCARDIOGRAM COMPLETE; Future   Tricha Ruggirello K.  [facility]ag[facility] [place] [facility] [phone]

## 2023-03-19 NOTE — Patient Instructions (Signed)
To take diflucan today and then repeat in 3 days.  To use cream every night for 7 days.

## 2023-03-20 LAB — COMPLETE METABOLIC PANEL WITH GFR
AG Ratio: 1.7 (calc) (ref 1.0–2.5)
ALT: 14 U/L (ref 6–29)
AST: 16 U/L (ref 10–35)
Albumin: 4 g/dL (ref 3.6–5.1)
Alkaline phosphatase (APISO): 53 U/L (ref 37–153)
BUN: 25 mg/dL (ref 7–25)
CO2: 26 mmol/L (ref 20–32)
Calcium: 9.2 mg/dL (ref 8.6–10.4)
Chloride: 104 mmol/L (ref 98–110)
Creat: 0.9 mg/dL (ref 0.60–0.95)
Globulin: 2.4 g/dL (calc) (ref 1.9–3.7)
Glucose, Bld: 86 mg/dL (ref 65–99)
Potassium: 4.7 mmol/L (ref 3.5–5.3)
Sodium: 138 mmol/L (ref 135–146)
Total Bilirubin: 0.7 mg/dL (ref 0.2–1.2)
Total Protein: 6.4 g/dL (ref 6.1–8.1)
eGFR: 65 mL/min/{1.73_m2} (ref >=60)

## 2023-03-23 ENCOUNTER — Ambulatory Visit (HOSPITAL_COMMUNITY): Payer: Medicare Other | Attending: Cardiology

## 2023-03-23 DIAGNOSIS — R011 Cardiac murmur, unspecified: Secondary | ICD-10-CM | POA: Diagnosis not present

## 2023-03-23 LAB — ECHOCARDIOGRAM COMPLETE
Area-P 1/2: 3.75 cm2
S' Lateral: 2.2 cm

## 2023-03-26 ENCOUNTER — Encounter: Payer: Self-pay | Admitting: Nurse Practitioner

## 2023-04-28 ENCOUNTER — Other Ambulatory Visit: Payer: Medicare Other

## 2023-05-03 ENCOUNTER — Ambulatory Visit: Payer: Medicare Other | Admitting: Nurse Practitioner

## 2023-05-06 ENCOUNTER — Other Ambulatory Visit: Payer: Medicare Other

## 2023-05-06 DIAGNOSIS — E785 Hyperlipidemia, unspecified: Secondary | ICD-10-CM | POA: Diagnosis not present

## 2023-05-06 DIAGNOSIS — K219 Gastro-esophageal reflux disease without esophagitis: Secondary | ICD-10-CM | POA: Diagnosis not present

## 2023-05-06 DIAGNOSIS — M542 Cervicalgia: Secondary | ICD-10-CM | POA: Diagnosis not present

## 2023-05-08 ENCOUNTER — Other Ambulatory Visit: Payer: Self-pay | Admitting: Nurse Practitioner

## 2023-05-08 DIAGNOSIS — I10 Essential (primary) hypertension: Secondary | ICD-10-CM

## 2023-05-10 ENCOUNTER — Encounter: Payer: Self-pay | Admitting: Nurse Practitioner

## 2023-05-10 ENCOUNTER — Ambulatory Visit: Payer: Medicare Other | Admitting: Nurse Practitioner

## 2023-05-10 VITALS — BP 128/84 | HR 77 | Temp 97.8°F | Ht 65.0 in | Wt 194.4 lb

## 2023-05-10 DIAGNOSIS — G8929 Other chronic pain: Secondary | ICD-10-CM

## 2023-05-10 DIAGNOSIS — M5441 Lumbago with sciatica, right side: Secondary | ICD-10-CM | POA: Diagnosis not present

## 2023-05-10 DIAGNOSIS — I10 Essential (primary) hypertension: Secondary | ICD-10-CM | POA: Diagnosis not present

## 2023-05-10 DIAGNOSIS — K219 Gastro-esophageal reflux disease without esophagitis: Secondary | ICD-10-CM

## 2023-05-10 DIAGNOSIS — F410 Panic disorder [episodic paroxysmal anxiety] without agoraphobia: Secondary | ICD-10-CM

## 2023-05-10 DIAGNOSIS — B3731 Acute candidiasis of vulva and vagina: Secondary | ICD-10-CM | POA: Diagnosis not present

## 2023-05-10 DIAGNOSIS — F411 Generalized anxiety disorder: Secondary | ICD-10-CM

## 2023-05-10 DIAGNOSIS — E785 Hyperlipidemia, unspecified: Secondary | ICD-10-CM

## 2023-05-10 MED ORDER — LOSARTAN POTASSIUM 50 MG PO TABS
50.0000 mg | ORAL_TABLET | Freq: Every day | ORAL | 3 refills | Status: DC
Start: 2023-05-10 — End: 2024-05-10

## 2023-05-10 MED ORDER — NYSTATIN 100000 UNIT/GM EX CREA: 1.0000 | TOPICAL_CREAM | Freq: Two times a day (BID) | CUTANEOUS | 11 refills | Status: AC | PRN

## 2023-05-10 MED ORDER — FLUCONAZOLE 150 MG PO TABS
ORAL_TABLET | ORAL | 0 refills | Status: DC
Start: 2023-05-10 — End: 2024-05-15

## 2023-05-10 NOTE — Progress Notes (Signed)
Careteam: Patient Care Team: Patricia Seller, NP as PCP - General (Geriatric Medicine)  PLACE OF SERVICE:  [facility]  Advanced Directive information Does Patient Have a Medical Advance Directive?: Yes, Type of Advance Directive: Living will, Does patient want to make changes to medical advance directive?: No - Patient declined  Allergies  Allergen Reactions   Shellfish Allergy Diarrhea and Nausea And Vomiting    Chief Complaint  Patient presents with   Medical Management of Chronic Issues    6 month follow-up. Discuss need for covid booster and flu vaccine (out of stock). Discuss omeprazole and famotidine. Out of Losartan x 2 days. Discuss diflucan.      HPI: Patient is a 82 y.o. female for routine follow up.   Htn- blood pressure varies, up and down.  Sbp 157 one day 125 the next.  Closer to 130 most days.   She been exercising more. Playing more golf.   She has a tingly feeling in her left upper leg.  Starts in her back and goes around.  Pain/discomfort where she has to move her leg due to tingling.  Bothers her mostly in the evening.   GERD- using pepcid twice day routinely over the last week. Eating more fresh fruits and vegetables.   Sister was diagnosised with GIST- continues to grow- reports doctors trying to find treatment plan. Went to the hospital with increase in abdominal pain and vomiting blood.  Going to [facility], lives in [place]. She is concerned over her.  Other sister lives in [place], getting more forgetful.    Review of Systems:  Review of Systems  Constitutional:  Negative for chills, fever and weight loss.  HENT:  Negative for tinnitus.   Respiratory:  Negative for cough, sputum production and shortness of breath.   Cardiovascular:  Negative for chest pain, palpitations and leg swelling.  Gastrointestinal:  Negative for abdominal pain, constipation, diarrhea and heartburn.  Genitourinary:  Negative for dysuria, frequency and urgency.   Musculoskeletal:  Positive for back pain and myalgias. Negative for falls and joint pain.  Skin: Negative.   Neurological:  Negative for dizziness and headaches.  Psychiatric/Behavioral:  Negative for depression and memory loss. The patient does not have insomnia.     Past Medical History:  Diagnosis Date   Acute perichondritis of pinna    Allergic rhinitis due to pollen    Disorder of bone and cartilage, unspecified    Encounter for long-term (current) use of other medications    History of echocardiogram    Echo 9/18: EF 65-70   History of exercise stress test 06/2017   ETT 9/18: no ischemia; hypertensive BP response   HLD (hyperlipidemia)    Osteoarthritis    Panic disorder    Unspecified dermatitis due to sun    Unspecified hypothyroidism    Past Surgical History:  Procedure Laterality Date   BREAST BIOPSY Right 07/14/2016   BREAST BIOPSY Right 07/15/2016   cataract surgery Bilateral 10/12/2016   childbirth     x2 "blocks"   COLONOSCOPY     x2   TONSILLECTOMY  1949   removed   Social History:   reports that she has never smoked. She has never used smokeless tobacco. She reports current alcohol use of about 2.0 standard drinks of alcohol per week. She reports that she does not use drugs.  Family History  Problem Relation Age of Onset   Stroke Mother    Heart failure Mother 48   Hypertension  Mother    Heart attack Father 54   Hyperlipidemia Sister    Atrial fibrillation Sister    Cancer Sister        Stomach Area   Thyroid disease Sister    Atrial fibrillation Brother    Breast cancer Maternal Grandmother 69   Thyroid disease Daughter    Fibromyalgia Daughter    Heart disease Son    Thyroid disease Son    Thyroid disease Son    Colon cancer Neg Hx     Medications: Patient's Medications  New Prescriptions   No medications on file  Previous Medications   ACETAMINOPHEN (TYLENOL) 650 MG CR TABLET    Take 1,300 mg by mouth in the morning and at bedtime.    ASPIRIN EC 81 MG TABLET    Take 81 mg by mouth daily. Swallow whole.   ATORVASTATIN (LIPITOR) 10 MG TABLET    TAKE 1 TABLET BY MOUTH DAILY FOR CHOLESTEROL   CALCIUM PO    Take 1,000 mg by mouth daily.    CHOLECALCIFEROL (VITAMIN D3) 125 MCG (5000 UT) CAPS    Take one capsule by mouth once daily.   CITALOPRAM (CELEXA) 20 MG TABLET    Take 1 tablet (20 mg total) by mouth daily.   DICLOFENAC SODIUM (VOLTAREN) 1 % GEL    Apply 4 g topically 4 (four) times daily as needed.   FAMOTIDINE (PEPCID) 10 MG TABLET    Take 1 tablet (10 mg total) by mouth 2 (two) times daily as needed for heartburn or indigestion.   FISH OIL-OMEGA-3 FATTY ACIDS 1000 MG CAPSULE    Take 1 g by mouth daily.    FLUCONAZOLE (DIFLUCAN) 150 MG TABLET    Take 1 tablet (150 mg total) by mouth daily.   FLUTICASONE (FLONASE) 50 MCG/ACT NASAL SPRAY    Place 1 spray into both nostrils daily.   LEVOTHYROXINE (SYNTHROID) 50 MCG TABLET    TAKE 1 TABLET BY MOUTH ONCE DAILY 30 MINUTES PRIOR TO BREAKFAST FOR THYROID   LOSARTAN (COZAAR) 50 MG TABLET    TAKE 1 TABLET (50 MG TOTAL) BY MOUTH DAILY.   MULTIPLE VITAMIN (MULTIVITAMIN) TABLET    Take 1 tablet by mouth daily.   NYSTATIN CREAM (MYCOSTATIN)    Apply 1 application topically 2 (two) times daily as needed.   OMEPRAZOLE (PRILOSEC) 20 MG CAPSULE    Take 1 capsule (20 mg total) by mouth daily.   TRIAMCINOLONE CREAM (KENALOG) 0.1 %    Apply 1 application topically daily as needed (itching).   Modified Medications   No medications on file  Discontinued Medications   MICONAZOLE (MONISTAT 7) 2 % VAGINAL CREAM    Place 1 Applicatorful vaginally at bedtime.    Physical Exam:  Vitals:   05/10/23 0857  BP: 128/84  Pulse: 77  Temp: 97.8 F (36.6 C)  TempSrc: Temporal  SpO2: 98%  Weight: 194 lb 6.4 oz (88.2 kg)  Height: 5\' 5"  (1.651 m)   Body mass index is 32.35 kg/m. Wt Readings from Last 3 Encounters:  05/10/23 194 lb 6.4 oz (88.2 kg)  03/19/23 195 lb 9.6 oz (88.7 kg)  11/02/22  196 lb (88.9 kg)    Physical Exam Constitutional:      General: She is not in acute distress.    Appearance: She is well-developed. She is not diaphoretic.  HENT:     Head: Normocephalic and atraumatic.     Mouth/Throat:     Pharynx: No oropharyngeal exudate.  Eyes:  Conjunctiva/sclera: Conjunctivae normal.     Pupils: Pupils are equal, round, and reactive to light.  Cardiovascular:     Rate and Rhythm: Normal rate and regular rhythm.     Heart sounds: Normal heart sounds.  Pulmonary:     Effort: Pulmonary effort is normal.     Breath sounds: Normal breath sounds.  Abdominal:     General: Bowel sounds are normal.     Palpations: Abdomen is soft.  Musculoskeletal:     Cervical back: Normal range of motion and neck supple.     Right lower leg: No edema.     Left lower leg: No edema.  Skin:    General: Skin is warm and dry.  Neurological:     Mental Status: She is alert.  Psychiatric:        Mood and Affect: Mood normal.     Labs reviewed: Basic Metabolic Panel: Recent Labs    10/26/22 0832 03/19/23 1030 05/06/23 0802  NA 141 138 138  K 4.3 4.7 4.5  CL 104 104 105  CO2 27 26 25   GLUCOSE 89 86 94  BUN 20 25 22   CREATININE 0.89 0.90 0.90  CALCIUM 9.4 9.2 9.3   Liver Function Tests: Recent Labs    10/26/22 0832 03/19/23 1030 05/06/23 0802  AST 17 16 20   ALT 14 14 15   BILITOT 0.6 0.7 0.6  PROT 6.5 6.4 6.3   No results for input(s): "LIPASE", "AMYLASE" in the last 8760 hours. No results for input(s): "AMMONIA" in the last 8760 hours. CBC: Recent Labs    10/26/22 0832 05/06/23 0802  WBC 5.9 5.6  NEUTROABS 3,103 2,514  HGB 13.8 12.7  HCT 40.6 37.8  MCV 92.7 95.5  PLT 274 299   Lipid Panel: Recent Labs    05/06/23 0802  CHOL 154  HDL 62  LDLCALC 69  TRIG 146  CHOLHDL 2.5   TSH: No results for input(s): "TSH" in the last 8760 hours. A1C: Lab Results  Component Value Date   HGBA1C 5.2 03/20/2020     Assessment/Plan 1. Primary  hypertension -doing well on current regimen -continue dietary modifications - losartan (COZAAR) 50 MG tablet; Take 1 tablet (50 mg total) by mouth daily.  Dispense: 90 tablet; Refill: 3 - COMPLETE METABOLIC PANEL WITH GFR; Future - CBC with Differential/Platelet; Future  2. Chronic right-sided low back pain with right-sided sciatica - Ambulatory referral to Physical Therapy for further evaluation and treatment  3. Vaginal yeast infection -has not completely resolved - fluconazole (DIFLUCAN) 150 MG tablet; Take one tablet then repeat after 72 hours  Dispense: 2 tablet; Refill: 0  4. Gastroesophageal reflux disease without esophagitis -stable on pepcid with dietary modifications  5. Hyperlipidemia with target LDL less than 100 -continues on lipitor with dietary modifications  6. Generalized anxiety disorder with panic attacks -overall stable, continues on celexa 20 mg daily  Return in about 6 months (around 11/10/2023) for routine follow up .  Patricia Cantu. Biagio Borg [facility] [phone]

## 2023-05-10 NOTE — Patient Instructions (Signed)
Can use naproxen 220 mg by mouth twice daily up to 5 days- take with food.

## 2023-05-11 ENCOUNTER — Telehealth: Payer: Self-pay

## 2023-05-11 NOTE — Telephone Encounter (Addendum)
Patient called to say she had asked for her PT referral to go to the same place as referral went in the past (reference referral from March 2024), however she received a call from a different PT Group. Patient is asking that we re-work the referral for the [facility] location that she mentioned at yesterday's visit.   [facility]

## 2023-05-11 NOTE — Telephone Encounter (Signed)
Below is the response from referral coordinator   Referral has been sent to the [facility] and the pt has been sent a Medical laboratory scientific officer.   There was no mention of a location on the referral. Please remind providers if the pt requests a location to note it on the referral moving forward.   Thanks,  Alcario Drought

## 2023-06-14 ENCOUNTER — Ambulatory Visit: Payer: Medicare Other | Admitting: Physical Therapy

## 2023-07-12 DIAGNOSIS — Z9842 Cataract extraction status, left eye: Secondary | ICD-10-CM | POA: Diagnosis not present

## 2023-07-12 DIAGNOSIS — Z9841 Cataract extraction status, right eye: Secondary | ICD-10-CM | POA: Diagnosis not present

## 2023-07-12 DIAGNOSIS — H5211 Myopia, right eye: Secondary | ICD-10-CM | POA: Diagnosis not present

## 2023-07-12 DIAGNOSIS — H0288B Meibomian gland dysfunction left eye, upper and lower eyelids: Secondary | ICD-10-CM | POA: Diagnosis not present

## 2023-07-13 ENCOUNTER — Ambulatory Visit: Payer: Medicare Other | Admitting: Podiatry

## 2023-07-13 ENCOUNTER — Encounter: Payer: Self-pay | Admitting: Podiatry

## 2023-07-13 DIAGNOSIS — M722 Plantar fascial fibromatosis: Secondary | ICD-10-CM | POA: Diagnosis not present

## 2023-07-13 MED ORDER — TRIAMCINOLONE ACETONIDE 10 MG/ML IJ SUSP
2.5000 mg | Freq: Once | INTRAMUSCULAR | Status: AC
Start: 2023-07-13 — End: 2023-07-13
  Administered 2023-07-13: 2.5 mg via INTRA_ARTICULAR

## 2023-07-13 MED ORDER — DEXAMETHASONE SODIUM PHOSPHATE 120 MG/30ML IJ SOLN
4.0000 mg | Freq: Once | INTRAMUSCULAR | Status: AC
Start: 2023-07-13 — End: 2023-07-13
  Administered 2023-07-13: 4 mg via INTRA_ARTICULAR

## 2023-07-13 NOTE — Patient Instructions (Signed)

## 2023-07-13 NOTE — Progress Notes (Signed)
  Subjective:  Patient ID: Patricia Cantu, female    DOB: October 31, 1940,   MRN: [id number]  Chief Complaint  Patient presents with   Foot Pain    Pt presents today for right heel pain that has been going for a few days now.    82 y.o. female presents for concern of right heel pain that started yesterday. Relates she had gone on a walk and did fine but then all of the sudden started having a lot of pain in the right heel. Relates anytime she steps on it very painful especially after sitting for a while. Denies any current treatments . Denies any other pedal complaints. Denies n/v/f/c.   Past Medical History:  Diagnosis Date   Acute perichondritis of pinna    Allergic rhinitis due to pollen    Disorder of bone and cartilage, unspecified    Encounter for long-term (current) use of other medications    History of echocardiogram    Echo 9/18: EF 65-70   History of exercise stress test 06/2017   ETT 9/18: no ischemia; hypertensive BP response   HLD (hyperlipidemia)    Osteoarthritis    Panic disorder    Unspecified dermatitis due to sun    Unspecified hypothyroidism     Objective:  Physical Exam: Vascular: DP/PT pulses 2/4 bilateral. CFT <3 seconds. Normal hair growth on digits. No edema.  Skin. No lacerations or abrasions bilateral feet.  Musculoskeletal: MMT 5/5 bilateral lower extremities in DF, PF, Inversion and Eversion. Deceased ROM in DF of ankle joint. Tender to the medial calcaneal tubercle right . No pain with achilles, PT or arch. No pain with calcaneal squeeze.  Neurological: Sensation intact to light touch.   Assessment:   1. Plantar fasciitis, right      Plan:  Patient was evaluated and treated and all questions answered. Discussed plantar fasciitis with patient.  X-rays reviewed from previous and discussed with patient. No acute fractures or dislocations noted. Mild spurring noted at inferior calcaneus.  Discussed treatment options including, ice, NSAIDS, supportive  shoes, bracing, and stretching. Stretching exercises provided to be done on a daily basis.   PF brace dispensed. Patient requesting injection today. Procedure note below.   Follow-up 6 weeks or sooner if any problems arise. In the meantime, encouraged to call the office with any questions, concerns, change in symptoms.   Procedure:  Discussed etiology, pathology, conservative vs. surgical therapies. At this time a plantar fascial injection was recommended.  The patient agreed and a sterile skin prep was applied.  An injection consisting of  1cc dexamethasone 0.5 cc kenalog and 1cc marcaine mixture was infiltrated at the point of maximal tenderness on the right Heel.  Bandaid applied. The patient tolerated this well and was given instructions for aftercare.    Louann Sjogren, DPM

## 2023-07-27 NOTE — Therapy (Unsigned)
OUTPATIENT PHYSICAL THERAPY THORACOLUMBAR EVALUATION   Patient Name: Patricia Cantu MRN: [id number] DOB:[birth year], 82 y.o., female Today's Date: 07/28/2023  END OF SESSION:  PT End of Session - 07/28/23 1120     Visit Number 1    Number of Visits 12    Date for PT Re-Evaluation 09/27/23    Authorization Type BC[employer]    Progress Note Due on Visit 10    PT Start Time 1045    PT Stop Time 1130    PT Time Calculation (min) 45 min    Activity Tolerance Patient tolerated treatment well    Behavior During Therapy WFL for tasks assessed/performed             Past Medical History:  Diagnosis Date   Acute perichondritis of pinna    Allergic rhinitis due to pollen    Disorder of bone and cartilage, unspecified    Encounter for long-term (current) use of other medications    History of echocardiogram    Echo 9/18: EF 65-70   History of exercise stress test 06/2017   ETT 9/18: no ischemia; hypertensive BP response   HLD (hyperlipidemia)    Osteoarthritis    Panic disorder    Unspecified dermatitis due to sun    Unspecified hypothyroidism    Past Surgical History:  Procedure Laterality Date   BREAST BIOPSY Right 07/14/2016   BREAST BIOPSY Right 07/15/2016   cataract surgery Bilateral 10/12/2016   childbirth     x2 "blocks"   COLONOSCOPY     x2   TONSILLECTOMY  1949   removed   Patient Active Problem List   Diagnosis Date Noted   Chronic pain of both knees 08/25/2018   Generalized anxiety disorder with panic attacks 01/27/2018   Overweight (BMI 25.0-29.9) 01/27/2018   Family history of MI (myocardial infarction) 04/12/2017   Insomnia 11/23/2013   Premature atrial complexes 02/20/2013   Osteoarthritis of both knees 01/02/2013   Hyperlipidemia with target LDL less than 100 01/02/2013   Vitamin D deficiency 01/02/2013   Hypothyroidism 01/02/2013   Depressive disorder, not elsewhere classified 01/02/2013    PCP: [name], NP  REFERRING PROVIDER: [name], NP  REFERRING DIAG: [phone] (ICD-10-CM) - Chronic right-sided low back pain with right-sided sciatica  Rationale for Evaluation and Treatment: Rehabilitation  THERAPY DIAG:  Abnormal posture  Other low back pain  Muscle weakness (generalized)  ONSET DATE: August  SUBJECTIVE:  SUBJECTIVE STATEMENT: Reports R lumbosacral pain extending to R thigh, ongoing August due to potential stress.  Symptoms steady over time.  Symptoms onset with driving, sitting.  Feels better on the move, able to get relief with position changes.    PERTINENT HISTORY:  Chronic right-sided low back pain with right-sided sciatica - Ambulatory referral to Physical Therapy for further evaluation and treatment  PAIN:  Are you having pain? Yes: NPRS scale: 5/10 Pain location: R lumbosacral region and R thigh Pain description: ache Aggravating factors: prolonged  Relieving factors: position changes  PRECAUTIONS: None  RED FLAGS: None   WEIGHT BEARING RESTRICTIONS: No  FALLS:  Has patient fallen in last 6 months? No  OCCUPATION: retired  PLOF: Independent  PATIENT GOALS: To get relief from my symptoms  NEXT MD VISIT: As needed  OBJECTIVE:  Note: Objective measures were completed at Evaluation unless otherwise noted.  DIAGNOSTIC FINDINGS:  none  PATIENT SURVEYS:  FOTO 67(71 predicted)  MUSCLE LENGTH: Hamstrings: Right 90 deg; Left 90 deg Thomas test: Right positive for symptoms  POSTURE: rounded shoulders, forward head, decreased lumbar lordosis, and increased thoracic kyphosis  PALPATION: TTP R piriformis  LUMBAR ROM:   AROM eval  Flexion 75%  Extension 50%  Right lateral flexion 75%  Left lateral flexion 75%  Right rotation   Left rotation    (Blank rows = not tested)  LOWER  EXTREMITY ROM:   WNL  Passive  Right eval Left eval  Hip flexion    Hip extension    Hip abduction    Hip adduction    Hip internal rotation    Hip external rotation    Knee flexion    Knee extension    Ankle dorsiflexion    Ankle plantarflexion    Ankle inversion    Ankle eversion     (Blank rows = not tested)  LOWER EXTREMITY MMT:    MMT Right eval Left eval  Hip flexion 4- 4-  Hip extension 4- 4-  Hip abduction 4- 4-  Hip adduction    Hip internal rotation    Hip external rotation    Knee flexion 4- 4-  Knee extension 4- 4-  Ankle dorsiflexion    Ankle plantarflexion 4- 4-  Ankle inversion    Ankle eversion     (Blank rows = not tested)  LUMBAR SPECIAL TESTS:  Straight leg raise test: Negative and Slump test: Positive for R thigh symptoms  FUNCTIONAL TESTS:  30 seconds chair stand test  GAIT: Distance walked: 45ftx2 Assistive device utilized: None Level of assistance: Complete Independence Comments: flexed spine/hip posture  TODAY'S TREATMENT:                                                                                                                              DATE: 07/28/23 Eval and HEP    PATIENT EDUCATION:  Education details: Discussed eval findings, rehab rationale and POC and patient is in agreement  Person educated: Patient Education method: Explanation Education comprehension: verbalized understanding and needs further education  HOME EXERCISE PROGRAM: Access Code: [email or web address] URL: [email or web address] Date: 07/28/2023 Prepared by: Gustavus Bryant  Exercises - Hip Flexor Stretch at [facility] of Bed  - 2 x daily - 5 x weekly - 1 sets - 2 reps - 30s hold - Static Prone on Elbows  - 2 x daily - 5 x weekly - 1 sets - 1 reps - 2 min hold - Clamshell  - 2 x daily - 5 x weekly - 1 sets - 15 reps  ASSESSMENT:  CLINICAL IMPRESSION: Patient is a 82 y.o. female who was seen today for physical therapy evaluation and treatment for  ongoing R lumbosacral discomfort and R anterior thigh paresthesias ongoing since August.  Symptoms relieved with position changes.  5x STS time [facility], BLE flexibility is good throughout.  Lumbar ROM restricted in extension, mild R piriformis discomfort noted.  R hip flexor tightness and core weakness noted.  OBJECTIVE IMPAIRMENTS: Abnormal gait, decreased activity tolerance, decreased endurance, decreased knowledge of condition, decreased mobility, decreased ROM, decreased strength, increased fascial restrictions, impaired flexibility, improper body mechanics, postural dysfunction, and pain.   ACTIVITY LIMITATIONS: carrying, lifting, bending, sitting, and standing  PARTICIPATION LIMITATIONS: driving and golf  PERSONAL FACTORS: Age, Fitness, Past/current experiences, and Time since onset of injury/illness/exacerbation are also affecting patient's functional outcome.   REHAB POTENTIAL: Good  CLINICAL DECISION MAKING: Stable/uncomplicated  EVALUATION COMPLEXITY: Low   GOALS: Goals reviewed with patient? No  SHORT TERM GOALS: Target date: 08/18/2023    Patient to demonstrate independence in HEP Baseline: [id number] Goal status: INITIAL  2.  Increase lumbar extension to 75% Baseline:  AROM eval  Flexion 75%  Extension 50%  Right lateral flexion 75%  Left lateral flexion 75%  Right rotation   Left rotation    Goal status: INITIAL    LONG TERM GOALS: Target date: 09/08/2023    Increase FOTO score to 71 Baseline: 67 Goal status: INITIAL  2.  Increase BLE strength to 4/5 Baseline:  MMT Right eval Left eval  Hip flexion 4- 4-  Hip extension 4- 4-  Hip abduction 4- 4-  Hip adduction    Hip internal rotation    Hip external rotation    Knee flexion 4- 4-  Knee extension 4- 4-  Ankle dorsiflexion    Ankle plantarflexion 4- 4-   Goal status: INITIAL  3.  Decrease pain levels to 1/10 Baseline: 2-3 level of discomfort Goal status: INITIAL  4.  Resolve R piriformis  tenderness Baseline: Moderate discomfort Goal status: INITIAL    PLAN:  PT FREQUENCY: 1-2x/week  PT DURATION: 6 weeks  PLANNED INTERVENTIONS: 97164- PT Re-evaluation, 97110-Therapeutic exercises, 97530- Therapeutic activity, 97112- Neuromuscular re-education, 97535- Self Care, 47829- Manual therapy, [id number]- Gait training, 97014- Electrical stimulation (unattended), Patient/Family education, Dry Needling, Joint mobilization, Spinal mobilization, Cryotherapy, and Moist heat.  PLAN FOR NEXT SESSION: HEP review and update, manual techniques as appropriate, aerobic tasks, ROM and flexibility activities, strengthening and PREs, TPDN, gait and balance training as needed     Hildred Laser, PT 07/28/2023, 11:50 AM

## 2023-07-28 ENCOUNTER — Other Ambulatory Visit: Payer: Self-pay

## 2023-07-28 ENCOUNTER — Ambulatory Visit: Payer: Medicare Other | Attending: Nurse Practitioner

## 2023-07-28 DIAGNOSIS — G8929 Other chronic pain: Secondary | ICD-10-CM | POA: Diagnosis not present

## 2023-07-28 DIAGNOSIS — R293 Abnormal posture: Secondary | ICD-10-CM | POA: Diagnosis not present

## 2023-07-28 DIAGNOSIS — M6281 Muscle weakness (generalized): Secondary | ICD-10-CM | POA: Diagnosis not present

## 2023-07-28 DIAGNOSIS — M5441 Lumbago with sciatica, right side: Secondary | ICD-10-CM | POA: Diagnosis not present

## 2023-07-28 DIAGNOSIS — M5459 Other low back pain: Secondary | ICD-10-CM | POA: Diagnosis not present

## 2023-08-09 NOTE — Therapy (Unsigned)
OUTPATIENT PHYSICAL THERAPY THORACOLUMBAR EVALUATION   Patient Name: Patricia Cantu MRN: [id number] DOB:[birth year], 82 y.o., female Today's Date: 08/11/2023  END OF SESSION:  PT End of Session - 08/11/23 0959     Visit Number 2    Number of Visits 12    Date for PT Re-Evaluation 09/27/23    Authorization Type BCBS    Progress Note Due on Visit 10    PT Start Time 1000    PT Stop Time 1040    PT Time Calculation (min) 40 min    Activity Tolerance Patient tolerated treatment well    Behavior During Therapy WFL for tasks assessed/performed              Past Medical History:  Diagnosis Date   Acute perichondritis of pinna    Allergic rhinitis due to pollen    Disorder of bone and cartilage, unspecified    Encounter for long-term (current) use of other medications    History of echocardiogram    Echo 9/18: EF 65-70   History of exercise stress test 06/2017   ETT 9/18: no ischemia; hypertensive BP response   HLD (hyperlipidemia)    Osteoarthritis    Panic disorder    Unspecified dermatitis due to sun    Unspecified hypothyroidism    Past Surgical History:  Procedure Laterality Date   BREAST BIOPSY Right 07/14/2016   BREAST BIOPSY Right 07/15/2016   cataract surgery Bilateral 10/12/2016   childbirth     x2 "blocks"   COLONOSCOPY     x2   TONSILLECTOMY  1949   removed   Patient Active Problem List   Diagnosis Date Noted   Chronic pain of both knees 08/25/2018   Generalized anxiety disorder with panic attacks 01/27/2018   Overweight (BMI 25.0-29.9) 01/27/2018   Family history of MI (myocardial infarction) 04/12/2017   Insomnia 11/23/2013   Premature atrial complexes 02/20/2013   Osteoarthritis of both knees 01/02/2013   Hyperlipidemia with target LDL less than 100 01/02/2013   Vitamin D deficiency 01/02/2013   Hypothyroidism 01/02/2013   Depressive disorder, not elsewhere classified 01/02/2013    PCP: [name], NP  REFERRING PROVIDER: [name], NP  REFERRING DIAG: [phone] (ICD-10-CM) - Chronic right-sided low back pain with right-sided sciatica  Rationale for Evaluation and Treatment: Rehabilitation  THERAPY DIAG:  Abnormal posture  Other low back pain  Muscle weakness (generalized)  ONSET DATE: August  SUBJECTIVE:  SUBJECTIVE STATEMENT: No changes since last session.  Has been compliant with HEP but unable to tolerate prone position due to back pain.  PERTINENT HISTORY:  Chronic right-sided low back pain with right-sided sciatica - Ambulatory referral to Physical Therapy for further evaluation and treatment  PAIN:  Are you having pain? Yes: NPRS scale: 5/10 Pain location: R lumbosacral region and R thigh Pain description: ache Aggravating factors: prolonged  Relieving factors: position changes  PRECAUTIONS: None  RED FLAGS: None   WEIGHT BEARING RESTRICTIONS: No  FALLS:  Has patient fallen in last 6 months? No  OCCUPATION: retired  PLOF: Independent  PATIENT GOALS: To get relief from my symptoms  NEXT MD VISIT: As needed  OBJECTIVE:  Note: Objective measures were completed at Evaluation unless otherwise noted.  DIAGNOSTIC FINDINGS:  none  PATIENT SURVEYS:  FOTO 67(71 predicted)  MUSCLE LENGTH: Hamstrings: Right 90 deg; Left 90 deg Thomas test: Right positive for symptoms  POSTURE: rounded shoulders, forward head, decreased lumbar lordosis, and increased thoracic kyphosis  PALPATION: TTP R piriformis  LUMBAR ROM:   AROM eval  Flexion 75%  Extension 50%  Right lateral flexion 75%  Left lateral flexion 75%  Right rotation   Left rotation    (Blank rows = not tested)  LOWER EXTREMITY ROM:   WNL  Passive  Right eval Left eval  Hip flexion    Hip extension    Hip abduction     Hip adduction    Hip internal rotation    Hip external rotation    Knee flexion    Knee extension    Ankle dorsiflexion    Ankle plantarflexion    Ankle inversion    Ankle eversion     (Blank rows = not tested)  LOWER EXTREMITY MMT:    MMT Right eval Left eval  Hip flexion 4- 4-  Hip extension 4- 4-  Hip abduction 4- 4-  Hip adduction    Hip internal rotation    Hip external rotation    Knee flexion 4- 4-  Knee extension 4- 4-  Ankle dorsiflexion    Ankle plantarflexion 4- 4-  Ankle inversion    Ankle eversion     (Blank rows = not tested)  LUMBAR SPECIAL TESTS:  Straight leg raise test: Negative and Slump test: Positive for R thigh symptoms  FUNCTIONAL TESTS:  30 seconds chair stand test  GAIT: Distance walked: 26ftx2 Assistive device utilized: None Level of assistance: Complete Independence Comments: flexed spine/hip posture  TODAY'S TREATMENT:          [facility] Adult PT Treatment:                                                DATE: 08/11/23 Therapeutic Exercise: Nustep L4 6 min Seated hamstring stretch 30s x2 B QL stretch 30s x2 B P-ball curl-ups 10x B, 10/10 unilaterally SKTC 30s x2 B Supine hip fallouts GTB 10x B, 10/10 unilaterally Manual Therapy: Piriformis release R 2 min  DATE: 07/28/23 Eval and HEP    PATIENT EDUCATION:  Education details: Discussed eval findings, rehab rationale and POC and patient is in agreement  Person educated: Patient Education method: Explanation Education comprehension: verbalized understanding and needs further education  HOME EXERCISE PROGRAM: Access Code: [email or web address] URL: [email or web address] Date: 08/11/2023 Prepared by: Gustavus Bryant  Exercises - Hip Flexor Stretch at [facility] of Bed  - 2 x daily - 5 x weekly - 1 sets - 2 reps - 30s hold - Static Prone on Elbows  - 2 x daily - 5 x  weekly - 1 sets - 1 reps - 2 min hold - Clamshell  - 2 x daily - 5 x weekly - 1 sets - 15 reps - Supine Piriformis Stretch with Foot on Ground  - 2 x daily - 5 x weekly - 1 sets - 2 reps - 30s hold  ASSESSMENT:  CLINICAL IMPRESSION: First f/u session.  Treatment focus was core strengthening and stretching tasks.  Marked core weakness observed, minimal tightness observed in BLEs. Incorporated hip strengthening and added piriformis release. Updated HEP as npted.  Patient is a 82 y.o. female who was seen today for physical therapy evaluation and treatment for ongoing R lumbosacral discomfort and R anterior thigh paresthesias ongoing since August.  Symptoms relieved with position changes.  5x STS time [facility], BLE flexibility is good throughout.  Lumbar ROM restricted in extension, mild R piriformis discomfort noted.  R hip flexor tightness and core weakness noted.  OBJECTIVE IMPAIRMENTS: Abnormal gait, decreased activity tolerance, decreased endurance, decreased knowledge of condition, decreased mobility, decreased ROM, decreased strength, increased fascial restrictions, impaired flexibility, improper body mechanics, postural dysfunction, and pain.   ACTIVITY LIMITATIONS: carrying, lifting, bending, sitting, and standing  PARTICIPATION LIMITATIONS: driving and golf  PERSONAL FACTORS: Age, Fitness, Past/current experiences, and Time since onset of injury/illness/exacerbation are also affecting patient's functional outcome.   REHAB POTENTIAL: Good  CLINICAL DECISION MAKING: Stable/uncomplicated  EVALUATION COMPLEXITY: Low   GOALS: Goals reviewed with patient? No  SHORT TERM GOALS: Target date: 08/18/2023    Patient to demonstrate independence in HEP Baseline: [id number] Goal status: INITIAL  2.  Increase lumbar extension to 75% Baseline:  AROM eval  Flexion 75%  Extension 50%  Right lateral flexion 75%  Left lateral flexion 75%  Right rotation   Left rotation    Goal status:  INITIAL    LONG TERM GOALS: Target date: 09/08/2023    Increase FOTO score to 71 Baseline: 67 Goal status: INITIAL  2.  Increase BLE strength to 4/5 Baseline:  MMT Right eval Left eval  Hip flexion 4- 4-  Hip extension 4- 4-  Hip abduction 4- 4-  Hip adduction    Hip internal rotation    Hip external rotation    Knee flexion 4- 4-  Knee extension 4- 4-  Ankle dorsiflexion    Ankle plantarflexion 4- 4-   Goal status: INITIAL  3.  Decrease pain levels to 1/10 Baseline: 2-3 level of discomfort Goal status: INITIAL  4.  Resolve R piriformis tenderness Baseline: Moderate discomfort Goal status: INITIAL    PLAN:  PT FREQUENCY: 1-2x/week  PT DURATION: 6 weeks  PLANNED INTERVENTIONS: 97164- PT Re-evaluation, 97110-Therapeutic exercises, 97530- Therapeutic activity, 97112- Neuromuscular re-education, 97535- Self Care, 63875- Manual therapy, [id number]- Gait training, 97014- Electrical stimulation (unattended), Patient/Family education, Dry Needling, Joint mobilization, Spinal mobilization, Cryotherapy, and Moist heat.  PLAN FOR NEXT SESSION: HEP review and update, manual techniques as appropriate, aerobic tasks, ROM and flexibility  activities, strengthening and PREs, TPDN, gait and balance training as needed     Hildred Laser, PT 08/11/2023, 10:41 AM

## 2023-08-11 ENCOUNTER — Ambulatory Visit: Payer: Medicare Other | Attending: Nurse Practitioner

## 2023-08-11 DIAGNOSIS — M5459 Other low back pain: Secondary | ICD-10-CM | POA: Insufficient documentation

## 2023-08-11 DIAGNOSIS — M6281 Muscle weakness (generalized): Secondary | ICD-10-CM | POA: Insufficient documentation

## 2023-08-11 DIAGNOSIS — R293 Abnormal posture: Secondary | ICD-10-CM | POA: Diagnosis not present

## 2023-08-12 NOTE — Therapy (Signed)
OUTPATIENT PHYSICAL THERAPY THORACOLUMBAR EVALUATION   Patient Name: Patricia Cantu MRN: [id number] DOB:October 20, 1940, 82 y.o., female Today's Date: 08/13/2023  END OF SESSION:  PT End of Session - 08/13/23 1046     Visit Number 3    Number of Visits 12    Date for PT Re-Evaluation 09/27/23    Authorization Type BCBS    Progress Note Due on Visit 10    PT Start Time 1045    PT Stop Time 1125    PT Time Calculation (min) 40 min    Activity Tolerance Patient tolerated treatment well    Behavior During Therapy WFL for tasks assessed/performed               Past Medical History:  Diagnosis Date   Acute perichondritis of pinna    Allergic rhinitis due to pollen    Disorder of bone and cartilage, unspecified    Encounter for long-term (current) use of other medications    History of echocardiogram    Echo 9/18: EF 65-70   History of exercise stress test 06/2017   ETT 9/18: no ischemia; hypertensive BP response   HLD (hyperlipidemia)    Osteoarthritis    Panic disorder    Unspecified dermatitis due to sun    Unspecified hypothyroidism    Past Surgical History:  Procedure Laterality Date   BREAST BIOPSY Right 07/14/2016   BREAST BIOPSY Right 07/15/2016   cataract surgery Bilateral 10/12/2016   childbirth     x2 "blocks"   COLONOSCOPY     x2   TONSILLECTOMY  1949   removed   Patient Active Problem List   Diagnosis Date Noted   Chronic pain of both knees 08/25/2018   Generalized anxiety disorder with panic attacks 01/27/2018   Overweight (BMI 25.0-29.9) 01/27/2018   Family history of MI (myocardial infarction) 04/12/2017   Insomnia 11/23/2013   Premature atrial complexes 02/20/2013   Osteoarthritis of both knees 01/02/2013   Hyperlipidemia with target LDL less than 100 01/02/2013   Vitamin D deficiency 01/02/2013   Hypothyroidism 01/02/2013   Depressive disorder, not elsewhere classified 01/02/2013    PCP: [name], NP  REFERRING PROVIDER:  [name], NP  REFERRING DIAG: [phone] (ICD-10-CM) - Chronic right-sided low back pain with right-sided sciatica  Rationale for Evaluation and Treatment: Rehabilitation  THERAPY DIAG:  Abnormal posture  Other low back pain  Muscle weakness (generalized)  ONSET DATE: August  SUBJECTIVE:  SUBJECTIVE STATEMENT: Last session beneficial as symptoms have lessened.  PERTINENT HISTORY:  Chronic right-sided low back pain with right-sided sciatica - Ambulatory referral to Physical Therapy for further evaluation and treatment  PAIN:  Are you having pain? Yes: NPRS scale: 5/10 Pain location: R lumbosacral region and R thigh Pain description: ache Aggravating factors: prolonged  Relieving factors: position changes  PRECAUTIONS: None  RED FLAGS: None   WEIGHT BEARING RESTRICTIONS: No  FALLS:  Has patient fallen in last 6 months? No  OCCUPATION: retired  PLOF: Independent  PATIENT GOALS: To get relief from my symptoms  NEXT MD VISIT: As needed  OBJECTIVE:  Note: Objective measures were completed at Evaluation unless otherwise noted.  DIAGNOSTIC FINDINGS:  none  PATIENT SURVEYS:  FOTO 67(71 predicted)  MUSCLE LENGTH: Hamstrings: Right 90 deg; Left 90 deg Thomas test: Right positive for symptoms  POSTURE: rounded shoulders, forward head, decreased lumbar lordosis, and increased thoracic kyphosis  PALPATION: TTP R piriformis  LUMBAR ROM:   AROM eval  Flexion 75%  Extension 50%  Right lateral flexion 75%  Left lateral flexion 75%  Right rotation   Left rotation    (Blank rows = not tested)  LOWER EXTREMITY ROM:   WNL  Passive  Right eval Left eval  Hip flexion    Hip extension    Hip abduction    Hip adduction    Hip internal rotation    Hip external  rotation    Knee flexion    Knee extension    Ankle dorsiflexion    Ankle plantarflexion    Ankle inversion    Ankle eversion     (Blank rows = not tested)  LOWER EXTREMITY MMT:    MMT Right eval Left eval  Hip flexion 4- 4-  Hip extension 4- 4-  Hip abduction 4- 4-  Hip adduction    Hip internal rotation    Hip external rotation    Knee flexion 4- 4-  Knee extension 4- 4-  Ankle dorsiflexion    Ankle plantarflexion 4- 4-  Ankle inversion    Ankle eversion     (Blank rows = not tested)  LUMBAR SPECIAL TESTS:  Straight leg raise test: Negative and Slump test: Positive for R thigh symptoms  FUNCTIONAL TESTS:  30 seconds chair stand test  GAIT: Distance walked: 78ftx2 Assistive device utilized: None Level of assistance: Complete Independence Comments: flexed spine/hip posture  TODAY'S TREATMENT:    [facility] Adult PT Treatment:                                                DATE: 08/13/23 Therapeutic Exercise: Nustep L4 6 min Seated hamstring stretch 30s x2 B QL stretch 30s x2 B P-ball curl-ups 12x B, 12/12 unilaterally Piriformis stretch(fig 4) 30s x2 B Supine hip fallouts GTB 12x B, 12/12 unilaterally Prone on elbows 2 min Manual Therapy: R piriformis release 3 min       [facility] Adult PT Treatment:                                                DATE: 08/11/23 Therapeutic Exercise: Nustep L4 6 min Seated hamstring stretch 30s x2 B QL stretch 30s x2 B P-ball curl-ups  10x B, 10/10 unilaterally SKTC 30s x2 B Supine hip fallouts GTB 10x B, 10/10 unilaterally Manual Therapy: Piriformis release R 2 min                                                                                                                       DATE: 07/28/23 Eval and HEP    PATIENT EDUCATION:  Education details: Discussed eval findings, rehab rationale and POC and patient is in agreement  Person educated: Patient Education method: Explanation Education comprehension: verbalized  understanding and needs further education  HOME EXERCISE PROGRAM: Access Code: [email or web address] URL: [email or web address] Date: 08/13/2023 Prepared by: Gustavus Bryant  Exercises - Hip Flexor Stretch at [facility] of Bed  - 2 x daily - 5 x weekly - 1 sets - 2 reps - 30s hold - Static Prone on Elbows  - 2 x daily - 5 x weekly - 1 sets - 1 reps - 2 min hold - Clamshell  - 2 x daily - 5 x weekly - 1 sets - 15 reps - Supine Piriformis Stretch with Foot on Ground  - 2 x daily - 5 x weekly - 1 sets - 2 reps - 30s hold - Hooklying Single Leg Bent Knee Fallouts with Resistance  - 2 x daily - 5 x weekly - 1 sets - 15 reps  ASSESSMENT:  CLINICAL IMPRESSION: Did well following last session and felt stretch and manual techniques were beneficial.  Continued to stretch lumbosacral region, emphasize core strength and release piriformis tightness.  Increased reps as noted to challenge patient and promote endurance.  Responding well to manual techniques  Patient is a 82 y.o. female who was seen today for physical therapy evaluation and treatment for ongoing R lumbosacral discomfort and R anterior thigh paresthesias ongoing since August.  Symptoms relieved with position changes.  5x STS time [facility], BLE flexibility is good throughout.  Lumbar ROM restricted in extension, mild R piriformis discomfort noted.  R hip flexor tightness and core weakness noted.  OBJECTIVE IMPAIRMENTS: Abnormal gait, decreased activity tolerance, decreased endurance, decreased knowledge of condition, decreased mobility, decreased ROM, decreased strength, increased fascial restrictions, impaired flexibility, improper body mechanics, postural dysfunction, and pain.   ACTIVITY LIMITATIONS: carrying, lifting, bending, sitting, and standing  PARTICIPATION LIMITATIONS: driving and golf  PERSONAL FACTORS: Age, Fitness, Past/current experiences, and Time since onset of injury/illness/exacerbation are also affecting patient's functional  outcome.   REHAB POTENTIAL: Good  CLINICAL DECISION MAKING: Stable/uncomplicated  EVALUATION COMPLEXITY: Low   GOALS: Goals reviewed with patient? No  SHORT TERM GOALS: Target date: 08/18/2023    Patient to demonstrate independence in HEP Baseline: [id number] Goal status: INITIAL  2.  Increase lumbar extension to 75% Baseline:  AROM eval  Flexion 75%  Extension 50%  Right lateral flexion 75%  Left lateral flexion 75%  Right rotation   Left rotation    Goal status: INITIAL    LONG TERM GOALS: Target date: 09/08/2023    Increase FOTO score to 71 Baseline:  67 Goal status: INITIAL  2.  Increase BLE strength to 4/5 Baseline:  MMT Right eval Left eval  Hip flexion 4- 4-  Hip extension 4- 4-  Hip abduction 4- 4-  Hip adduction    Hip internal rotation    Hip external rotation    Knee flexion 4- 4-  Knee extension 4- 4-  Ankle dorsiflexion    Ankle plantarflexion 4- 4-   Goal status: INITIAL  3.  Decrease pain levels to 1/10 Baseline: 2-3 level of discomfort Goal status: INITIAL  4.  Resolve R piriformis tenderness Baseline: Moderate discomfort Goal status: INITIAL    PLAN:  PT FREQUENCY: 1-2x/week  PT DURATION: 6 weeks  PLANNED INTERVENTIONS: 97164- PT Re-evaluation, 97110-Therapeutic exercises, 97530- Therapeutic activity, 97112- Neuromuscular re-education, 97535- Self Care, 96295- Manual therapy, [id number]- Gait training, 97014- Electrical stimulation (unattended), Patient/Family education, Dry Needling, Joint mobilization, Spinal mobilization, Cryotherapy, and Moist heat.  PLAN FOR NEXT SESSION: HEP review and update, manual techniques as appropriate, aerobic tasks, ROM and flexibility activities, strengthening and PREs, TPDN, gait and balance training as needed     Hildred Laser, PT 08/13/2023, 11:53 AM

## 2023-08-13 ENCOUNTER — Ambulatory Visit: Payer: Medicare Other

## 2023-08-13 DIAGNOSIS — M5459 Other low back pain: Secondary | ICD-10-CM

## 2023-08-13 DIAGNOSIS — R293 Abnormal posture: Secondary | ICD-10-CM

## 2023-08-13 DIAGNOSIS — M6281 Muscle weakness (generalized): Secondary | ICD-10-CM

## 2023-08-18 ENCOUNTER — Ambulatory Visit: Payer: Medicare Other

## 2023-08-18 DIAGNOSIS — M5459 Other low back pain: Secondary | ICD-10-CM

## 2023-08-18 DIAGNOSIS — M6281 Muscle weakness (generalized): Secondary | ICD-10-CM

## 2023-08-18 DIAGNOSIS — R293 Abnormal posture: Secondary | ICD-10-CM

## 2023-08-18 NOTE — Therapy (Signed)
OUTPATIENT PHYSICAL THERAPY THORACOLUMBAR EVALUATION   Patient Name: Patricia Cantu MRN: [id number] DOB:Jul 18, 1941, 82 y.o., female Today's Date: 08/18/2023  END OF SESSION:  PT End of Session - 08/18/23 0958     Visit Number 4    Number of Visits 12    Date for PT Re-Evaluation 09/27/23    Authorization Type BCBS    Progress Note Due on Visit 10    PT Start Time 1000    PT Stop Time 1040    PT Time Calculation (min) 40 min    Activity Tolerance Patient tolerated treatment well    Behavior During Therapy WFL for tasks assessed/performed                Past Medical History:  Diagnosis Date   Acute perichondritis of pinna    Allergic rhinitis due to pollen    Disorder of bone and cartilage, unspecified    Encounter for long-term (current) use of other medications    History of echocardiogram    Echo 9/18: EF 65-70   History of exercise stress test 06/2017   ETT 9/18: no ischemia; hypertensive BP response   HLD (hyperlipidemia)    Osteoarthritis    Panic disorder    Unspecified dermatitis due to sun    Unspecified hypothyroidism    Past Surgical History:  Procedure Laterality Date   BREAST BIOPSY Right 07/14/2016   BREAST BIOPSY Right 07/15/2016   cataract surgery Bilateral 10/12/2016   childbirth     x2 "blocks"   COLONOSCOPY     x2   TONSILLECTOMY  1949   removed   Patient Active Problem List   Diagnosis Date Noted   Chronic pain of both knees 08/25/2018   Generalized anxiety disorder with panic attacks 01/27/2018   Overweight (BMI 25.0-29.9) 01/27/2018   Family history of MI (myocardial infarction) 04/12/2017   Insomnia 11/23/2013   Premature atrial complexes 02/20/2013   Osteoarthritis of both knees 01/02/2013   Hyperlipidemia with target LDL less than 100 01/02/2013   Vitamin D deficiency 01/02/2013   Hypothyroidism 01/02/2013   Depressive disorder, not elsewhere classified 01/02/2013    PCP: [name], NP  REFERRING PROVIDER:  [name], NP  REFERRING DIAG: [phone] (ICD-10-CM) - Chronic right-sided low back pain with right-sided sciatica  Rationale for Evaluation and Treatment: Rehabilitation  THERAPY DIAG:  Abnormal posture  Other low back pain  Muscle weakness (generalized)  ONSET DATE: August  SUBJECTIVE:  SUBJECTIVE STATEMENT: Has been compliant with HEP.  Symptoms less intense, less frequent and of less duration. Still notes aggravation from golf but able to manage symptoms with stretching.  PERTINENT HISTORY:  Chronic right-sided low back pain with right-sided sciatica - Ambulatory referral to Physical Therapy for further evaluation and treatment  PAIN:  Are you having pain? Yes: NPRS scale: 5/10 Pain location: R lumbosacral region and R thigh Pain description: ache Aggravating factors: prolonged  Relieving factors: position changes  PRECAUTIONS: None  RED FLAGS: None   WEIGHT BEARING RESTRICTIONS: No  FALLS:  Has patient fallen in last 6 months? No  OCCUPATION: retired  PLOF: Independent  PATIENT GOALS: To get relief from my symptoms  NEXT MD VISIT: As needed  OBJECTIVE:  Note: Objective measures were completed at Evaluation unless otherwise noted.  DIAGNOSTIC FINDINGS:  none  PATIENT SURVEYS:  FOTO 67(71 predicted)  MUSCLE LENGTH: Hamstrings: Right 90 deg; Left 90 deg Thomas test: Right positive for symptoms  POSTURE: rounded shoulders, forward head, decreased lumbar lordosis, and increased thoracic kyphosis  PALPATION: TTP R piriformis  LUMBAR ROM:   AROM eval  Flexion 75%  Extension 50%  Right lateral flexion 75%  Left lateral flexion 75%  Right rotation   Left rotation    (Blank rows = not tested)  LOWER EXTREMITY ROM:   WNL  Passive  Right eval  Left eval  Hip flexion    Hip extension    Hip abduction    Hip adduction    Hip internal rotation    Hip external rotation    Knee flexion    Knee extension    Ankle dorsiflexion    Ankle plantarflexion    Ankle inversion    Ankle eversion     (Blank rows = not tested)  LOWER EXTREMITY MMT:    MMT Right eval Left eval  Hip flexion 4- 4-  Hip extension 4- 4-  Hip abduction 4- 4-  Hip adduction    Hip internal rotation    Hip external rotation    Knee flexion 4- 4-  Knee extension 4- 4-  Ankle dorsiflexion    Ankle plantarflexion 4- 4-  Ankle inversion    Ankle eversion     (Blank rows = not tested)  LUMBAR SPECIAL TESTS:  Straight leg raise test: Negative and Slump test: Positive for R thigh symptoms  FUNCTIONAL TESTS:  30 seconds chair stand test  GAIT: Distance walked: 32ftx2 Assistive device utilized: None Level of assistance: Complete Independence Comments: flexed spine/hip posture  TODAY'S TREATMENT:    [facility] Adult PT Treatment:                                                DATE: 08/18/23 Therapeutic Exercise: Nustep L4 8 min Introduced seated core exercises of hip tosses, shoulder tosses, chops and Victories, 15 reps with 2000g ball. Seated hamstring stretch 30s x2 B Prone on elbows 2 min Prone press up 10x R piriformis stretch with towel 30s x2 Manual Therapy: R piriformis release 3 min PA mobs L5-1 Grade III  [facility] Adult PT Treatment:  DATE: 08/13/23 Therapeutic Exercise: Nustep L4 6 min Seated hamstring stretch 30s x2 B QL stretch 30s x2 B P-ball curl-ups 12x B, 12/12 unilaterally Piriformis stretch(fig 4) 30s x2 B Supine hip fallouts GTB 12x B, 12/12 unilaterally Prone on elbows 2 min Manual Therapy: R piriformis release 3 min       [facility] Adult PT Treatment:                                                DATE: 08/11/23 Therapeutic Exercise: Nustep L4 6 min Seated hamstring stretch 30s x2  B QL stretch 30s x2 B P-ball curl-ups 10x B, 10/10 unilaterally SKTC 30s x2 B Supine hip fallouts GTB 10x B, 10/10 unilaterally Manual Therapy: Piriformis release R 2 min                                                                                                                       DATE: 07/28/23 Eval and HEP    PATIENT EDUCATION:  Education details: Discussed eval findings, rehab rationale and POC and patient is in agreement  Person educated: Patient Education method: Explanation Education comprehension: verbalized understanding and needs further education  HOME EXERCISE PROGRAM: Access Code: [email or web address] URL: [email or web address] Date: 08/13/2023 Prepared by: Gustavus Bryant  Exercises - Hip Flexor Stretch at [place] [facility] of Bed  - 2 x daily - 5 x weekly - 1 sets - 2 reps - 30s hold - Static Prone on Elbows  - 2 x daily - 5 x weekly - 1 sets - 1 reps - 2 min hold - Clamshell  - 2 x daily - 5 x weekly - 1 sets - 15 reps - Supine Piriformis Stretch with Foot on Ground  - 2 x daily - 5 x weekly - 1 sets - 2 reps - 30s hold - Hooklying Single Leg Bent Knee Fallouts with Resistance  - 2 x daily - 5 x weekly - 1 sets - 15 reps  ASSESSMENT:  CLINICAL IMPRESSION: Increased time on aerobic component and incorporated additional tasks to promote lumbar extension.  Advanced piriformis stretch to accommodate increased mobility  Patient is a 82 y.o. female who was seen today for physical therapy evaluation and treatment for ongoing R lumbosacral discomfort and R anterior thigh paresthesias ongoing since August.  Symptoms relieved with position changes.  5x STS time [facility], BLE flexibility is good throughout.  Lumbar ROM restricted in extension, mild R piriformis discomfort noted.  R hip flexor tightness and core weakness noted.  OBJECTIVE IMPAIRMENTS: Abnormal gait, decreased activity tolerance, decreased endurance, decreased knowledge of condition, decreased mobility, decreased ROM,  decreased strength, increased fascial restrictions, impaired flexibility, improper body mechanics, postural dysfunction, and pain.   ACTIVITY LIMITATIONS: carrying, lifting, bending, sitting, and standing  PARTICIPATION LIMITATIONS: driving and golf  PERSONAL FACTORS: Age, Fitness, Past/current experiences, and Time since onset of injury/illness/exacerbation are  also affecting patient's functional outcome.   REHAB POTENTIAL: Good  CLINICAL DECISION MAKING: Stable/uncomplicated  EVALUATION COMPLEXITY: Low   GOALS: Goals reviewed with patient? No  SHORT TERM GOALS: Target date: 08/18/2023    Patient to demonstrate independence in HEP Baseline: [id number] Goal status: INITIAL  2.  Increase lumbar extension to 75% Baseline:  AROM eval  Flexion 75%  Extension 50%  Right lateral flexion 75%  Left lateral flexion 75%  Right rotation   Left rotation    Goal status: INITIAL    LONG TERM GOALS: Target date: 09/08/2023    Increase FOTO score to 71 Baseline: 67 Goal status: INITIAL  2.  Increase BLE strength to 4/5 Baseline:  MMT Right eval Left eval  Hip flexion 4- 4-  Hip extension 4- 4-  Hip abduction 4- 4-  Hip adduction    Hip internal rotation    Hip external rotation    Knee flexion 4- 4-  Knee extension 4- 4-  Ankle dorsiflexion    Ankle plantarflexion 4- 4-   Goal status: INITIAL  3.  Decrease pain levels to 1/10 Baseline: 2-3 level of discomfort Goal status: INITIAL  4.  Resolve R piriformis tenderness Baseline: Moderate discomfort Goal status: INITIAL    PLAN:  PT FREQUENCY: 1-2x/week  PT DURATION: 6 weeks  PLANNED INTERVENTIONS: 97164- PT Re-evaluation, 97110-Therapeutic exercises, 97530- Therapeutic activity, 97112- Neuromuscular re-education, 97535- Self Care, 27253- Manual therapy, [id number]- Gait training, 97014- Electrical stimulation (unattended), Patient/Family education, Dry Needling, Joint mobilization, Spinal mobilization,  Cryotherapy, and Moist heat.  PLAN FOR NEXT SESSION: HEP review and update, manual techniques as appropriate, aerobic tasks, ROM and flexibility activities, strengthening and PREs, TPDN, gait and balance training as needed     Hildred Laser, PT 08/18/2023, 11:09 AM

## 2023-08-20 ENCOUNTER — Ambulatory Visit: Payer: Medicare Other

## 2023-08-24 ENCOUNTER — Ambulatory Visit: Payer: Medicare Other | Admitting: Podiatry

## 2023-08-24 ENCOUNTER — Encounter: Payer: Self-pay | Admitting: Podiatry

## 2023-08-24 DIAGNOSIS — K080 Exfoliation of teeth due to systemic causes: Secondary | ICD-10-CM | POA: Diagnosis not present

## 2023-08-24 DIAGNOSIS — M722 Plantar fascial fibromatosis: Secondary | ICD-10-CM

## 2023-08-24 NOTE — Progress Notes (Signed)
  Subjective:  Patient ID: Patricia Cantu, female    DOB: [birth year],   MRN: [id number]  No chief complaint on file.   82 y.o. female presents for follow-up of right foot plantar fasciitis. Relates a lot better. Does have some tenderness but overall doing really well. Has been stretching and bracing and wearing supportive shoes. Relates injection did help. Denies any other pedal complaints. Denies n/v/f/c.   Past Medical History:  Diagnosis Date   Acute perichondritis of pinna    Allergic rhinitis due to pollen    Disorder of bone and cartilage, unspecified    Encounter for long-term (current) use of other medications    History of echocardiogram    Echo 9/18: EF 65-70   History of exercise stress test 06/2017   ETT 9/18: no ischemia; hypertensive BP response   HLD (hyperlipidemia)    Osteoarthritis    Panic disorder    Unspecified dermatitis due to sun    Unspecified hypothyroidism     Objective:  Physical Exam: Vascular: DP/PT pulses 2/4 bilateral. CFT <3 seconds. Normal hair growth on digits. No edema.  Skin. No lacerations or abrasions bilateral feet.  Musculoskeletal: MMT 5/5 bilateral lower extremities in DF, PF, Inversion and Eversion. Deceased ROM in DF of ankle joint. Minimally tender to the medial calcaneal tubercle right . No pain with achilles, PT or arch. No pain with calcaneal squeeze.  Neurological: Sensation intact to light touch.   Assessment:   1. Plantar fasciitis, right       Plan:  Patient was evaluated and treated and all questions answered. Discussed plantar fasciitis with patient.  X-rays reviewed from previous and discussed with patient. No acute fractures or dislocations noted. Mild spurring noted at inferior calcaneus.  Discussed treatment options including, ice, NSAIDS, supportive shoes, bracing, and stretching.  Continue stretching and support.  Follow-up as needed   Patricia Cantu, DPM

## 2023-08-24 NOTE — Therapy (Unsigned)
OUTPATIENT PHYSICAL THERAPY TREATMENT NOTE  Patient Name: Patricia Cantu MRN: [id number] DOB:09-28-1941, 82 y.o., female Today's Date: 08/25/2023  END OF SESSION:  PT End of Session - 08/25/23 1000     Visit Number 5    Number of Visits 12    Date for PT Re-Evaluation 09/27/23    Authorization Type BCBS    Progress Note Due on Visit 10    PT Start Time 1000    PT Stop Time 1040    PT Time Calculation (min) 40 min    Activity Tolerance Patient tolerated treatment well    Behavior During Therapy WFL for tasks assessed/performed                 Past Medical History:  Diagnosis Date   Acute perichondritis of pinna    Allergic rhinitis due to pollen    Disorder of bone and cartilage, unspecified    Encounter for long-term (current) use of other medications    History of echocardiogram    Echo 9/18: EF 65-70   History of exercise stress test 06/2017   ETT 9/18: no ischemia; hypertensive BP response   HLD (hyperlipidemia)    Osteoarthritis    Panic disorder    Unspecified dermatitis due to sun    Unspecified hypothyroidism    Past Surgical History:  Procedure Laterality Date   BREAST BIOPSY Right 07/14/2016   BREAST BIOPSY Right 07/15/2016   cataract surgery Bilateral 10/12/2016   childbirth     x2 "blocks"   COLONOSCOPY     x2   TONSILLECTOMY  1949   removed   Patient Active Problem List   Diagnosis Date Noted   Chronic pain of both knees 08/25/2018   Generalized anxiety disorder with panic attacks 01/27/2018   Overweight (BMI 25.0-29.9) 01/27/2018   Family history of MI (myocardial infarction) 04/12/2017   Insomnia 11/23/2013   Premature atrial complexes 02/20/2013   Osteoarthritis of both knees 01/02/2013   Hyperlipidemia with target LDL less than 100 01/02/2013   Vitamin D deficiency 01/02/2013   Hypothyroidism 01/02/2013   Depressive disorder, not elsewhere classified 01/02/2013    PCP: [name], NP  REFERRING PROVIDER: [name], NP  REFERRING DIAG: [phone] (ICD-10-CM) - Chronic right-sided low back pain with right-sided sciatica  Rationale for Evaluation and Treatment: Rehabilitation  THERAPY DIAG:  Abnormal posture  Other low back pain  Muscle weakness (generalized)  ONSET DATE: August  SUBJECTIVE:  SUBJECTIVE STATEMENT: Has been doing well.  Cannot readily recall last episode of significant symptoms.  PERTINENT HISTORY:  Chronic right-sided low back pain with right-sided sciatica - Ambulatory referral to Physical Therapy for further evaluation and treatment  PAIN:  Are you having pain? Yes: NPRS scale: 5/10 Pain location: R lumbosacral region and R thigh Pain description: ache Aggravating factors: prolonged  Relieving factors: position changes  PRECAUTIONS: None  RED FLAGS: None   WEIGHT BEARING RESTRICTIONS: No  FALLS:  Has patient fallen in last 6 months? No  OCCUPATION: retired  PLOF: Independent  PATIENT GOALS: To get relief from my symptoms  NEXT MD VISIT: As needed  OBJECTIVE:  Note: Objective measures were completed at Evaluation unless otherwise noted.  DIAGNOSTIC FINDINGS:  none  PATIENT SURVEYS:  FOTO 67(71 predicted)  MUSCLE LENGTH: Hamstrings: Right 90 deg; Left 90 deg Thomas test: Right positive for symptoms  POSTURE: rounded shoulders, forward head, decreased lumbar lordosis, and increased thoracic kyphosis  PALPATION: TTP R piriformis  LUMBAR ROM:   AROM eval  Flexion 75%  Extension 50%  Right lateral flexion 75%  Left lateral flexion 75%  Right rotation   Left rotation    (Blank rows = not tested)  LOWER EXTREMITY ROM:   WNL  Passive  Right eval Left eval  Hip flexion    Hip extension    Hip abduction    Hip adduction    Hip internal  rotation    Hip external rotation    Knee flexion    Knee extension    Ankle dorsiflexion    Ankle plantarflexion    Ankle inversion    Ankle eversion     (Blank rows = not tested)  LOWER EXTREMITY MMT:    MMT Right eval Left eval  Hip flexion 4- 4-  Hip extension 4- 4-  Hip abduction 4- 4-  Hip adduction    Hip internal rotation    Hip external rotation    Knee flexion 4- 4-  Knee extension 4- 4-  Ankle dorsiflexion    Ankle plantarflexion 4- 4-  Ankle inversion    Ankle eversion     (Blank rows = not tested)  LUMBAR SPECIAL TESTS:  Straight leg raise test: Negative and Slump test: Positive for R thigh symptoms  FUNCTIONAL TESTS:  30 seconds chair stand test  GAIT: Distance walked: 64ftx2 Assistive device utilized: None Level of assistance: Complete Independence Comments: flexed spine/hip posture  TODAY'S TREATMENT:    [facility] Adult PT Treatment:                                                DATE: 08/25/23 Therapeutic Exercise: Nustep L5 8 min Introduced seated core exercises of hip tosses, shoulder tosses, chops and Victories, 15 reps with 3000g ball. Seated hamstring stretch 30s x2 B R piriformis stretch with towel 30s x2 Supine hip fallouts BluTB 15x B, 15/15 unilaterally S/L clams BluTB 15x B Manual Therapy: R piriformis release 2 min  [facility] Adult PT Treatment:                                                DATE: 08/18/23 Therapeutic Exercise: Nustep L4 8 min Introduced seated core exercises of  hip tosses, shoulder tosses, chops and Victories, 15 reps with 2000g ball. Seated hamstring stretch 30s x2 B Prone on elbows 2 min Prone press up 10x R piriformis stretch with towel 30s x2 Manual Therapy: R piriformis release 2 min PA mobs L5-1 Grade III  [facility] Adult PT Treatment:                                                DATE: 08/13/23 Therapeutic Exercise: Nustep L4 6 min Seated hamstring stretch 30s x2 B QL stretch 30s x2 B P-ball curl-ups 12x B,  12/12 unilaterally Piriformis stretch(fig 4) 30s x2 B Supine hip fallouts GTB 12x B, 12/12 unilaterally Prone on elbows 2 min Manual Therapy: R piriformis release 3 min       [facility] Adult PT Treatment:                                                DATE: 08/11/23 Therapeutic Exercise: Nustep L4 6 min Seated hamstring stretch 30s x2 B QL stretch 30s x2 B P-ball curl-ups 10x B, 10/10 unilaterally SKTC 30s x2 B Supine hip fallouts GTB 10x B, 10/10 unilaterally Manual Therapy: Piriformis release R 2 min                                                                                                                       DATE: 07/28/23 Eval and HEP    PATIENT EDUCATION:  Education details: Discussed eval findings, rehab rationale and POC and patient is in agreement  Person educated: Patient Education method: Explanation Education comprehension: verbalized understanding and needs further education  HOME EXERCISE PROGRAM: Access Code: [email or web address] URL: [email or web address] Date: 08/13/2023 Prepared by: Gustavus Bryant  Exercises - Hip Flexor Stretch at [facility] of Bed  - 2 x daily - 5 x weekly - 1 sets - 2 reps - 30s hold - Static Prone on Elbows  - 2 x daily - 5 x weekly - 1 sets - 1 reps - 2 min hold - Clamshell  - 2 x daily - 5 x weekly - 1 sets - 15 reps - Supine Piriformis Stretch with Foot on Ground  - 2 x daily - 5 x weekly - 1 sets - 2 reps - 30s hold - Hooklying Single Leg Bent Knee Fallouts with Resistance  - 2 x daily - 5 x weekly - 1 sets - 15 reps  ASSESSMENT:  CLINICAL IMPRESSION: Added resistance and weight as noted to challenge patient.  Symptoms continue to diminish to the point where she cannot recall.  Incorporated additional hip strengthening exercises.  Patient is a 82 y.o. female who was seen today for physical therapy evaluation and treatment for ongoing R lumbosacral discomfort and R anterior  thigh paresthesias ongoing since August.  Symptoms relieved  with position changes.  5x STS time [facility], BLE flexibility is good throughout.  Lumbar ROM restricted in extension, mild R piriformis discomfort noted.  R hip flexor tightness and core weakness noted.  OBJECTIVE IMPAIRMENTS: Abnormal gait, decreased activity tolerance, decreased endurance, decreased knowledge of condition, decreased mobility, decreased ROM, decreased strength, increased fascial restrictions, impaired flexibility, improper body mechanics, postural dysfunction, and pain.   ACTIVITY LIMITATIONS: carrying, lifting, bending, sitting, and standing  PARTICIPATION LIMITATIONS: driving and golf  PERSONAL FACTORS: Age, Fitness, Past/current experiences, and Time since onset of injury/illness/exacerbation are also affecting patient's functional outcome.   REHAB POTENTIAL: Good  CLINICAL DECISION MAKING: Stable/uncomplicated  EVALUATION COMPLEXITY: Low   GOALS: Goals reviewed with patient? No  SHORT TERM GOALS: Target date: 08/18/2023    Patient to demonstrate independence in HEP Baseline: [id number] Goal status: Met  2.  Increase lumbar extension to 75% Baseline:  AROM eval  Flexion 75%  Extension 50%  Right lateral flexion 75%  Left lateral flexion 75%  Right rotation   Left rotation    Goal status: INITIAL    LONG TERM GOALS: Target date: 09/08/2023    Increase FOTO score to 71 Baseline: 67 Goal status: INITIAL  2.  Increase BLE strength to 4/5 Baseline:  MMT Right eval Left eval  Hip flexion 4- 4-  Hip extension 4- 4-  Hip abduction 4- 4-  Hip adduction    Hip internal rotation    Hip external rotation    Knee flexion 4- 4-  Knee extension 4- 4-  Ankle dorsiflexion    Ankle plantarflexion 4- 4-   Goal status: INITIAL  3.  Decrease pain levels to 1/10 Baseline: 2-3 level of discomfort Goal status: INITIAL  4.  Resolve R piriformis tenderness Baseline: Moderate discomfort Goal status: INITIAL    PLAN:  PT FREQUENCY: 1-2x/week  PT  DURATION: 6 weeks  PLANNED INTERVENTIONS: 97164- PT Re-evaluation, 97110-Therapeutic exercises, 97530- Therapeutic activity, 97112- Neuromuscular re-education, 97535- Self Care, 65784- Manual therapy, [id number]- Gait training, 97014- Electrical stimulation (unattended), Patient/Family education, Dry Needling, Joint mobilization, Spinal mobilization, Cryotherapy, and Moist heat.  PLAN FOR NEXT SESSION: HEP review and update, manual techniques as appropriate, aerobic tasks, ROM and flexibility activities, strengthening and PREs, TPDN, gait and balance training as needed     Hildred Laser, PT 08/25/2023, 10:42 AM

## 2023-08-25 ENCOUNTER — Ambulatory Visit: Payer: Medicare Other

## 2023-08-25 DIAGNOSIS — R293 Abnormal posture: Secondary | ICD-10-CM

## 2023-08-25 DIAGNOSIS — M5459 Other low back pain: Secondary | ICD-10-CM

## 2023-08-25 DIAGNOSIS — M6281 Muscle weakness (generalized): Secondary | ICD-10-CM | POA: Diagnosis not present

## 2023-08-25 NOTE — Therapy (Signed)
OUTPATIENT PHYSICAL THERAPY TREATMENT NOTE  Patient Name: Patricia Cantu MRN: [id number] DOB:09/02/1941, 82 y.o., female Today's Date: 08/27/2023  END OF SESSION:  PT End of Session - 08/27/23 1002     Visit Number 6    Number of Visits 12    Date for PT Re-Evaluation 09/27/23    Authorization Type [employer]    Progress Note Due on Visit 10    PT Start Time 1000    PT Stop Time 1040    PT Time Calculation (min) 40 min    Activity Tolerance Patient tolerated treatment well    Behavior During Therapy WFL for tasks assessed/performed                  Past Medical History:  Diagnosis Date   Acute perichondritis of pinna    Allergic rhinitis due to pollen    Disorder of bone and cartilage, unspecified    Encounter for long-term (current) use of other medications    History of echocardiogram    Echo 9/18: EF 65-70   History of exercise stress test 06/2017   ETT 9/18: no ischemia; hypertensive BP response   HLD (hyperlipidemia)    Osteoarthritis    Panic disorder    Unspecified dermatitis due to sun    Unspecified hypothyroidism    Past Surgical History:  Procedure Laterality Date   BREAST BIOPSY Right 07/14/2016   BREAST BIOPSY Right 07/15/2016   cataract surgery Bilateral 10/12/2016   childbirth     x2 "blocks"   COLONOSCOPY     x2   TONSILLECTOMY  1949   removed   Patient Active Problem List   Diagnosis Date Noted   Chronic pain of both knees 08/25/2018   Generalized anxiety disorder with panic attacks 01/27/2018   Overweight (BMI 25.0-29.9) 01/27/2018   Family history of MI (myocardial infarction) 04/12/2017   Insomnia 11/23/2013   Premature atrial complexes 02/20/2013   Osteoarthritis of both knees 01/02/2013   Hyperlipidemia with target LDL less than 100 01/02/2013   Vitamin D deficiency 01/02/2013   Hypothyroidism 01/02/2013   Depressive disorder, not elsewhere classified 01/02/2013    PCP: [name], NP  REFERRING PROVIDER: [name], NP  REFERRING DIAG: [phone] (ICD-10-CM) - Chronic right-sided low back pain with right-sided sciatica  Rationale for Evaluation and Treatment: Rehabilitation  THERAPY DIAG:  Abnormal posture  Other low back pain  Muscle weakness (generalized)  ONSET DATE: August  SUBJECTIVE:  SUBJECTIVE STATEMENT: Did well following last session but symptoms spiked to 9/10 w/o injury.  Symptoms have since resolved to minimal.  Discussed TPDN and patient requests to wait until next session pending symptoms.  PERTINENT HISTORY:  Chronic right-sided low back pain with right-sided sciatica - Ambulatory referral to Physical Therapy for further evaluation and treatment  PAIN:  Are you having pain? Yes: NPRS scale: 5/10 Pain location: R lumbosacral region and R thigh Pain description: ache Aggravating factors: prolonged  Relieving factors: position changes  PRECAUTIONS: None  RED FLAGS: None   WEIGHT BEARING RESTRICTIONS: No  FALLS:  Has patient fallen in last 6 months? No  OCCUPATION: retired  PLOF: Independent  PATIENT GOALS: To get relief from my symptoms  NEXT MD VISIT: As needed  OBJECTIVE:  Note: Objective measures were completed at Evaluation unless otherwise noted.  DIAGNOSTIC FINDINGS:  none  PATIENT SURVEYS:  FOTO 67(71 predicted)  MUSCLE LENGTH: Hamstrings: Right 90 deg; Left 90 deg Thomas test: Right positive for symptoms  POSTURE: rounded shoulders, forward head, decreased lumbar lordosis, and increased thoracic kyphosis  PALPATION: TTP R piriformis  LUMBAR ROM:   AROM eval  Flexion 75%  Extension 50%  Right lateral flexion 75%  Left lateral flexion 75%  Right rotation   Left rotation    (Blank rows = not tested)  LOWER EXTREMITY ROM:   WNL  Passive   Right eval Left eval  Hip flexion    Hip extension    Hip abduction    Hip adduction    Hip internal rotation    Hip external rotation    Knee flexion    Knee extension    Ankle dorsiflexion    Ankle plantarflexion    Ankle inversion    Ankle eversion     (Blank rows = not tested)  LOWER EXTREMITY MMT:    MMT Right eval Left eval  Hip flexion 4- 4-  Hip extension 4- 4-  Hip abduction 4- 4-  Hip adduction    Hip internal rotation    Hip external rotation    Knee flexion 4- 4-  Knee extension 4- 4-  Ankle dorsiflexion    Ankle plantarflexion 4- 4-  Ankle inversion    Ankle eversion     (Blank rows = not tested)  LUMBAR SPECIAL TESTS:  Straight leg raise test: Negative and Slump test: Positive for R thigh symptoms  FUNCTIONAL TESTS:  30 seconds chair stand test  GAIT: Distance walked: 58ftx2 Assistive device utilized: None Level of assistance: Complete Independence Comments: flexed spine/hip posture  TODAY'S TREATMENT:    [facility] Adult PT Treatment:                                                DATE: 08/27/23 Therapeutic Exercise: Nustep L4 6 min Introduced seated core exercises of hip tosses, shoulder tosses, chops and Victories, 15 reps with 3000g ball. Seated hamstring stretch 30s x2 B R piriformis stretch with towel 30s x2 Supine hip fallouts BluTB 15x B, 15/15 unilaterally S/L clams BluTB 15x B Prone on elbows 2 min R hip flexor stretch 30s x2 Manual Therapy: R piriformis release 2 min PA mobs L5-1 Grade III  [facility] Adult PT Treatment:  DATE: 08/25/23 Therapeutic Exercise: Nustep L5 8 min Introduced seated core exercises of hip tosses, shoulder tosses, chops and Victories, 15 reps with 3000g ball. Seated hamstring stretch 30s x2 B R piriformis stretch with towel 30s x2 Supine hip fallouts BluTB 15x B, 15/15 unilaterally S/L clams BluTB 15x B Manual Therapy: R piriformis release 2 min  [facility] Adult PT  Treatment:                                                DATE: 08/18/23 Therapeutic Exercise: Nustep L4 8 min Introduced seated core exercises of hip tosses, shoulder tosses, chops and Victories, 15 reps with 2000g ball. Seated hamstring stretch 30s x2 B Prone on elbows 2 min Prone press up 10x R piriformis stretch with towel 30s x2 Manual Therapy: R piriformis release 2 min PA mobs L5-1 Grade III  [facility] Adult PT Treatment:                                                DATE: 08/13/23 Therapeutic Exercise: Nustep L4 6 min Seated hamstring stretch 30s x2 B QL stretch 30s x2 B P-ball curl-ups 12x B, 12/12 unilaterally Piriformis stretch(fig 4) 30s x2 B Supine hip fallouts GTB 12x B, 12/12 unilaterally Prone on elbows 2 min Manual Therapy: R piriformis release 3 min       [facility] Adult PT Treatment:                                                DATE: 08/11/23 Therapeutic Exercise: Nustep L4 6 min Seated hamstring stretch 30s x2 B QL stretch 30s x2 B P-ball curl-ups 10x B, 10/10 unilaterally SKTC 30s x2 B Supine hip fallouts GTB 10x B, 10/10 unilaterally Manual Therapy: Piriformis release R 2 min                                                                                                                       DATE: 07/28/23 Eval and HEP    PATIENT EDUCATION:  Education details: Discussed eval findings, rehab rationale and POC and patient is in agreement  Person educated: Patient Education method: Explanation Education comprehension: verbalized understanding and needs further education  HOME EXERCISE PROGRAM: Access Code: [email or web address] URL: [email or web address] Date: 08/13/2023 Prepared by: Gustavus Bryant  Exercises - Hip Flexor Stretch at [facility] of Bed  - 2 x daily - 5 x weekly - 1 sets - 2 reps - 30s hold - Static Prone on Elbows  - 2 x daily - 5 x weekly - 1 sets - 1 reps - 2 min hold -  Clamshell  - 2 x daily - 5 x weekly - 1 sets - 15 reps - Supine  Piriformis Stretch with Foot on Ground  - 2 x daily - 5 x weekly - 1 sets - 2 reps - 30s hold - Hooklying Single Leg Bent Knee Fallouts with Resistance  - 2 x daily - 5 x weekly - 1 sets - 15 reps  ASSESSMENT:  CLINICAL IMPRESSION: Spike in symptoms attributed to cold weather onset.  Today's session continued with stretching and flexibility tasks.  Patient is a 82 y.o. female who was seen today for physical therapy evaluation and treatment for ongoing R lumbosacral discomfort and R anterior thigh paresthesias ongoing since August.  Symptoms relieved with position changes.  5x STS time [facility], BLE flexibility is good throughout.  Lumbar ROM restricted in extension, mild R piriformis discomfort noted.  R hip flexor tightness and core weakness noted.  OBJECTIVE IMPAIRMENTS: Abnormal gait, decreased activity tolerance, decreased endurance, decreased knowledge of condition, decreased mobility, decreased ROM, decreased strength, increased fascial restrictions, impaired flexibility, improper body mechanics, postural dysfunction, and pain.   ACTIVITY LIMITATIONS: carrying, lifting, bending, sitting, and standing  PARTICIPATION LIMITATIONS: driving and golf  PERSONAL FACTORS: Age, Fitness, Past/current experiences, and Time since onset of injury/illness/exacerbation are also affecting patient's functional outcome.   REHAB POTENTIAL: Good  CLINICAL DECISION MAKING: Stable/uncomplicated  EVALUATION COMPLEXITY: Low   GOALS: Goals reviewed with patient? No  SHORT TERM GOALS: Target date: 08/18/2023    Patient to demonstrate independence in HEP Baseline: [id number] Goal status: Met  2.  Increase lumbar extension to 75% Baseline:  AROM eval  Flexion 75%  Extension 50%  Right lateral flexion 75%  Left lateral flexion 75%  Right rotation   Left rotation    Goal status: INITIAL    LONG TERM GOALS: Target date: 09/08/2023    Increase FOTO score to 71 Baseline: 67 Goal status:  INITIAL  2.  Increase BLE strength to 4/5 Baseline:  MMT Right eval Left eval  Hip flexion 4- 4-  Hip extension 4- 4-  Hip abduction 4- 4-  Hip adduction    Hip internal rotation    Hip external rotation    Knee flexion 4- 4-  Knee extension 4- 4-  Ankle dorsiflexion    Ankle plantarflexion 4- 4-   Goal status: INITIAL  3.  Decrease pain levels to 1/10 Baseline: 2-3 level of discomfort Goal status: INITIAL  4.  Resolve R piriformis tenderness Baseline: Moderate discomfort Goal status: INITIAL    PLAN:  PT FREQUENCY: 1-2x/week  PT DURATION: 6 weeks  PLANNED INTERVENTIONS: 97164- PT Re-evaluation, 97110-Therapeutic exercises, 97530- Therapeutic activity, 97112- Neuromuscular re-education, 97535- Self Care, 25366- Manual therapy, [id number]- Gait training, 97014- Electrical stimulation (unattended), Patient/Family education, Dry Needling, Joint mobilization, Spinal mobilization, Cryotherapy, and Moist heat.  PLAN FOR NEXT SESSION: HEP review and update, manual techniques as appropriate, aerobic tasks, ROM and flexibility activities, strengthening and PREs, TPDN, gait and balance training as needed     Hildred Laser, PT 08/27/2023, 10:43 AM

## 2023-08-27 ENCOUNTER — Ambulatory Visit: Payer: Medicare Other

## 2023-08-27 DIAGNOSIS — M6281 Muscle weakness (generalized): Secondary | ICD-10-CM

## 2023-08-27 DIAGNOSIS — M5459 Other low back pain: Secondary | ICD-10-CM

## 2023-08-27 DIAGNOSIS — R293 Abnormal posture: Secondary | ICD-10-CM

## 2023-08-27 NOTE — Therapy (Deleted)
OUTPATIENT PHYSICAL THERAPY TREATMENT NOTE  Patient Name: Patricia Cantu MRN: [id number] DOB:1940-11-24, 82 y.o., female Today's Date: 08/27/2023  END OF SESSION:         Past Medical History:  Diagnosis Date   Acute perichondritis of pinna    Allergic rhinitis due to pollen    Disorder of bone and cartilage, unspecified    Encounter for long-term (current) use of other medications    History of echocardiogram    Echo 9/18: EF 65-70   History of exercise stress test 06/2017   ETT 9/18: no ischemia; hypertensive BP response   HLD (hyperlipidemia)    Osteoarthritis    Panic disorder    Unspecified dermatitis due to sun    Unspecified hypothyroidism    Past Surgical History:  Procedure Laterality Date   BREAST BIOPSY Right 07/14/2016   BREAST BIOPSY Right 07/15/2016   cataract surgery Bilateral 10/12/2016   childbirth     x2 "blocks"   COLONOSCOPY     x2   TONSILLECTOMY  1949   removed   Patient Active Problem List   Diagnosis Date Noted   Chronic pain of both knees 08/25/2018   Generalized anxiety disorder with panic attacks 01/27/2018   Overweight (BMI 25.0-29.9) 01/27/2018   Family history of MI (myocardial infarction) 04/12/2017   Insomnia 11/23/2013   Premature atrial complexes 02/20/2013   Osteoarthritis of both knees 01/02/2013   Hyperlipidemia with target LDL less than 100 01/02/2013   Vitamin D deficiency 01/02/2013   Hypothyroidism 01/02/2013   Depressive disorder, not elsewhere classified 01/02/2013    PCP: [name], NP  REFERRING PROVIDER: [name], NP  REFERRING DIAG: M54.41,G89.29 (ICD-10-CM) - Chronic right-sided low back pain with right-sided sciatica  Rationale for Evaluation and Treatment: Rehabilitation  THERAPY DIAG:  No diagnosis found.  ONSET DATE: August  SUBJECTIVE:                                                                                                                                                                                             SUBJECTIVE STATEMENT: Did well following last session but symptoms spiked to 9/10 w/o injury.  Symptoms have since resolved to minimal.  Discussed TPDN and patient requests to wait until next session pending symptoms.  PERTINENT HISTORY:  Chronic right-sided low back pain with right-sided sciatica - Ambulatory referral to Physical Therapy for further evaluation and treatment  PAIN:  Are you having pain? Yes: NPRS scale: 5/10 Pain location: R lumbosacral region and R thigh Pain description: ache Aggravating factors: prolonged  Relieving factors: position changes  PRECAUTIONS: None  RED FLAGS: None  WEIGHT BEARING RESTRICTIONS: No  FALLS:  Has patient fallen in last 6 months? No  OCCUPATION: retired  PLOF: Independent  PATIENT GOALS: To get relief from my symptoms  NEXT MD VISIT: As needed  OBJECTIVE:  Note: Objective measures were completed at Evaluation unless otherwise noted.  DIAGNOSTIC FINDINGS:  none  PATIENT SURVEYS:  FOTO 67(71 predicted)  MUSCLE LENGTH: Hamstrings: Right 90 deg; Left 90 deg Thomas test: Right positive for symptoms  POSTURE: rounded shoulders, forward head, decreased lumbar lordosis, and increased thoracic kyphosis  PALPATION: TTP R piriformis  LUMBAR ROM:   AROM eval  Flexion 75%  Extension 50%  Right lateral flexion 75%  Left lateral flexion 75%  Right rotation   Left rotation    (Blank rows = not tested)  LOWER EXTREMITY ROM:   WNL  Passive  Right eval Left eval  Hip flexion    Hip extension    Hip abduction    Hip adduction    Hip internal rotation    Hip external rotation    Knee flexion    Knee extension    Ankle dorsiflexion    Ankle plantarflexion    Ankle inversion    Ankle eversion     (Blank rows = not tested)  LOWER EXTREMITY MMT:    MMT Right eval Left eval  Hip flexion 4- 4-  Hip extension 4- 4-  Hip abduction 4- 4-  Hip adduction    Hip internal  rotation    Hip external rotation    Knee flexion 4- 4-  Knee extension 4- 4-  Ankle dorsiflexion    Ankle plantarflexion 4- 4-  Ankle inversion    Ankle eversion     (Blank rows = not tested)  LUMBAR SPECIAL TESTS:  Straight leg raise test: Negative and Slump test: Positive for R thigh symptoms  FUNCTIONAL TESTS:  30 seconds chair stand test  GAIT: Distance walked: 29ftx2 Assistive device utilized: None Level of assistance: Complete Independence Comments: flexed spine/hip posture  TODAY'S TREATMENT:    [facility] Adult PT Treatment:                                                DATE: 08/27/23 Therapeutic Exercise: Nustep L4 6 min Introduced seated core exercises of hip tosses, shoulder tosses, chops and Victories, 15 reps with 3000g ball. Seated hamstring stretch 30s x2 B R piriformis stretch with towel 30s x2 Supine hip fallouts BluTB 15x B, 15/15 unilaterally S/L clams BluTB 15x B Prone on elbows 2 min R hip flexor stretch 30s x2 Manual Therapy: R piriformis release 2 min PA mobs L5-1 Grade III  [facility] Adult PT Treatment:                                                DATE: 08/25/23 Therapeutic Exercise: Nustep L5 8 min Introduced seated core exercises of hip tosses, shoulder tosses, chops and Victories, 15 reps with 3000g ball. Seated hamstring stretch 30s x2 B R piriformis stretch with towel 30s x2 Supine hip fallouts BluTB 15x B, 15/15 unilaterally S/L clams BluTB 15x B Manual Therapy: R piriformis release 2 min  [facility] Adult PT Treatment:  DATE: 08/18/23 Therapeutic Exercise: Nustep L4 8 min Introduced seated core exercises of hip tosses, shoulder tosses, chops and Victories, 15 reps with 2000g ball. Seated hamstring stretch 30s x2 B Prone on elbows 2 min Prone press up 10x R piriformis stretch with towel 30s x2 Manual Therapy: R piriformis release 2 min PA mobs L5-1 Grade III  [facility] Adult PT Treatment:                                                 DATE: 08/13/23 Therapeutic Exercise: Nustep L4 6 min Seated hamstring stretch 30s x2 B QL stretch 30s x2 B P-ball curl-ups 12x B, 12/12 unilaterally Piriformis stretch(fig 4) 30s x2 B Supine hip fallouts GTB 12x B, 12/12 unilaterally Prone on elbows 2 min Manual Therapy: R piriformis release 3 min       [facility] Adult PT Treatment:                                                DATE: 08/11/23 Therapeutic Exercise: Nustep L4 6 min Seated hamstring stretch 30s x2 B QL stretch 30s x2 B P-ball curl-ups 10x B, 10/10 unilaterally SKTC 30s x2 B Supine hip fallouts GTB 10x B, 10/10 unilaterally Manual Therapy: Piriformis release R 2 min                                                                                                                       DATE: 07/28/23 Eval and HEP    PATIENT EDUCATION:  Education details: Discussed eval findings, rehab rationale and POC and patient is in agreement  Person educated: Patient Education method: Explanation Education comprehension: verbalized understanding and needs further education  HOME EXERCISE PROGRAM: Access Code: [email or web address] URL: [email or web address] Date: 08/13/2023 Prepared by: Gustavus Bryant  Exercises - Hip Flexor Stretch at [facility] of Bed  - 2 x daily - 5 x weekly - 1 sets - 2 reps - 30s hold - Static Prone on Elbows  - 2 x daily - 5 x weekly - 1 sets - 1 reps - 2 min hold - Clamshell  - 2 x daily - 5 x weekly - 1 sets - 15 reps - Supine Piriformis Stretch with Foot on Ground  - 2 x daily - 5 x weekly - 1 sets - 2 reps - 30s hold - Hooklying Single Leg Bent Knee Fallouts with Resistance  - 2 x daily - 5 x weekly - 1 sets - 15 reps  ASSESSMENT:  CLINICAL IMPRESSION: Spike in symptoms attributed to cold weather onset.  Today's session continued with stretching and flexibility tasks.  Patient is a 82 y.o. female who was seen today for physical therapy evaluation and treatment for  ongoing R lumbosacral  discomfort and R anterior thigh paresthesias ongoing since August.  Symptoms relieved with position changes.  5x STS time [facility], BLE flexibility is good throughout.  Lumbar ROM restricted in extension, mild R piriformis discomfort noted.  R hip flexor tightness and core weakness noted.  OBJECTIVE IMPAIRMENTS: Abnormal gait, decreased activity tolerance, decreased endurance, decreased knowledge of condition, decreased mobility, decreased ROM, decreased strength, increased fascial restrictions, impaired flexibility, improper body mechanics, postural dysfunction, and pain.   ACTIVITY LIMITATIONS: carrying, lifting, bending, sitting, and standing  PARTICIPATION LIMITATIONS: driving and golf  PERSONAL FACTORS: Age, Fitness, Past/current experiences, and Time since onset of injury/illness/exacerbation are also affecting patient's functional outcome.   REHAB POTENTIAL: Good  CLINICAL DECISION MAKING: Stable/uncomplicated  EVALUATION COMPLEXITY: Low   GOALS: Goals reviewed with patient? No  SHORT TERM GOALS: Target date: 08/18/2023    Patient to demonstrate independence in HEP Baseline: [id number] Goal status: Met  2.  Increase lumbar extension to 75% Baseline:  AROM eval  Flexion 75%  Extension 50%  Right lateral flexion 75%  Left lateral flexion 75%  Right rotation   Left rotation    Goal status: INITIAL    LONG TERM GOALS: Target date: 09/08/2023    Increase FOTO score to 71 Baseline: 67 Goal status: INITIAL  2.  Increase BLE strength to 4/5 Baseline:  MMT Right eval Left eval  Hip flexion 4- 4-  Hip extension 4- 4-  Hip abduction 4- 4-  Hip adduction    Hip internal rotation    Hip external rotation    Knee flexion 4- 4-  Knee extension 4- 4-  Ankle dorsiflexion    Ankle plantarflexion 4- 4-   Goal status: INITIAL  3.  Decrease pain levels to 1/10 Baseline: 2-3 level of discomfort Goal status: INITIAL  4.  Resolve R piriformis  tenderness Baseline: Moderate discomfort Goal status: INITIAL    PLAN:  PT FREQUENCY: 1-2x/week  PT DURATION: 6 weeks  PLANNED INTERVENTIONS: 97164- PT Re-evaluation, 97110-Therapeutic exercises, 97530- Therapeutic activity, 97112- Neuromuscular re-education, 97535- Self Care, 16109- Manual therapy, [id number]- Gait training, 97014- Electrical stimulation (unattended), Patient/Family education, Dry Needling, Joint mobilization, Spinal mobilization, Cryotherapy, and Moist heat.  PLAN FOR NEXT SESSION: HEP review and update, manual techniques as appropriate, aerobic tasks, ROM and flexibility activities, strengthening and PREs, TPDN, gait and balance training as needed     Hildred Laser, PT 08/27/2023, 11:46 AM

## 2023-08-30 ENCOUNTER — Telehealth: Payer: Self-pay

## 2023-08-30 ENCOUNTER — Ambulatory Visit: Payer: Medicare Other

## 2023-08-30 NOTE — Telephone Encounter (Addendum)
TC due to missed visit.  Spoke directly to patient who related she cancelled today's visit at her last session.  Patient will not be charged as a no show.

## 2023-09-01 ENCOUNTER — Ambulatory Visit: Payer: Medicare Other

## 2023-09-01 DIAGNOSIS — M5459 Other low back pain: Secondary | ICD-10-CM | POA: Diagnosis not present

## 2023-09-01 DIAGNOSIS — R293 Abnormal posture: Secondary | ICD-10-CM | POA: Diagnosis not present

## 2023-09-01 DIAGNOSIS — M6281 Muscle weakness (generalized): Secondary | ICD-10-CM | POA: Diagnosis not present

## 2023-09-01 NOTE — Therapy (Signed)
OUTPATIENT PHYSICAL THERAPY TREATMENT NOTE  Patient Name: Patricia Cantu MRN: [id number] DOB:06-23-1941, 82 y.o., female Today's Date: 09/01/2023  END OF SESSION:  PT End of Session - 09/01/23 1014     Visit Number 7    Number of Visits 12    Date for PT Re-Evaluation 09/27/23    Authorization Type [employer]    Progress Note Due on Visit 10    PT Start Time 1015    PT Stop Time 1058   5 minute taken out for TPDN   PT Time Calculation (min) 43 min    Activity Tolerance Patient tolerated treatment well    Behavior During Therapy [facility] for tasks assessed/performed                   Past Medical History:  Diagnosis Date   Acute perichondritis of pinna    Allergic rhinitis due to pollen    Disorder of bone and cartilage, unspecified    Encounter for long-term (current) use of other medications    History of echocardiogram    Echo 9/18: EF 65-70   History of exercise stress test 06/2017   ETT 9/18: no ischemia; hypertensive BP response   HLD (hyperlipidemia)    Osteoarthritis    Panic disorder    Unspecified dermatitis due to sun    Unspecified hypothyroidism    Past Surgical History:  Procedure Laterality Date   BREAST BIOPSY Right 07/14/2016   BREAST BIOPSY Right 07/15/2016   cataract surgery Bilateral 10/12/2016   childbirth     x2 "blocks"   COLONOSCOPY     x2   TONSILLECTOMY  1949   removed   Patient Active Problem List   Diagnosis Date Noted   Chronic pain of both knees 08/25/2018   Generalized anxiety disorder with panic attacks 01/27/2018   Overweight (BMI 25.0-29.9) 01/27/2018   Family history of MI (myocardial infarction) 04/12/2017   Insomnia 11/23/2013   Premature atrial complexes 02/20/2013   Osteoarthritis of both knees 01/02/2013   Hyperlipidemia with target LDL less than 100 01/02/2013   Vitamin D deficiency 01/02/2013   Hypothyroidism 01/02/2013   Depressive disorder, not elsewhere classified 01/02/2013    PCP: [name],  NP  REFERRING PROVIDER: [name], NP  REFERRING DIAG: [phone] (ICD-10-CM) - Chronic right-sided low back pain with right-sided sciatica  Rationale for Evaluation and Treatment: Rehabilitation  THERAPY DIAG:  Abnormal posture  Other low back pain  Muscle weakness (generalized)  ONSET DATE: August  SUBJECTIVE:  SUBJECTIVE STATEMENT:  Pt presents to PT with reports of continued LBP, although less today. Was able to practice golf yesterday and had some symptoms down her R LE. Has been compliant with HEP with no adverse effect.   PERTINENT HISTORY:  Chronic right-sided low back pain with right-sided sciatica - Ambulatory referral to Physical Therapy for further evaluation and treatment  PAIN:  Are you having pain? Yes: NPRS scale: 5/10 Pain location: R lumbosacral region and R thigh Pain description: ache Aggravating factors: prolonged  Relieving factors: position changes  PRECAUTIONS: None  RED FLAGS: None   WEIGHT BEARING RESTRICTIONS: No  FALLS:  Has patient fallen in last 6 months? No  OCCUPATION: retired  PLOF: Independent  PATIENT GOALS: To get relief from my symptoms  NEXT MD VISIT: As needed  OBJECTIVE:  Note: Objective measures were completed at Evaluation unless otherwise noted.  DIAGNOSTIC FINDINGS:  none  PATIENT SURVEYS:  FOTO 67(71 predicted)  MUSCLE LENGTH: Hamstrings: Right 90 deg; Left 90 deg Thomas test: Right positive for symptoms  POSTURE: rounded shoulders, forward head, decreased lumbar lordosis, and increased thoracic kyphosis  PALPATION: TTP R piriformis  LUMBAR ROM:   AROM eval  Flexion 75%  Extension 50%  Right lateral flexion 75%  Left lateral flexion 75%  Right rotation   Left rotation    (Blank rows = not  tested)  LOWER EXTREMITY ROM:   WNL  Passive  Right eval Left eval  Hip flexion    Hip extension    Hip abduction    Hip adduction    Hip internal rotation    Hip external rotation    Knee flexion    Knee extension    Ankle dorsiflexion    Ankle plantarflexion    Ankle inversion    Ankle eversion     (Blank rows = not tested)  LOWER EXTREMITY MMT:    MMT Right eval Left eval  Hip flexion 4- 4-  Hip extension 4- 4-  Hip abduction 4- 4-  Hip adduction    Hip internal rotation    Hip external rotation    Knee flexion 4- 4-  Knee extension 4- 4-  Ankle dorsiflexion    Ankle plantarflexion 4- 4-  Ankle inversion    Ankle eversion     (Blank rows = not tested)  LUMBAR SPECIAL TESTS:  Straight leg raise test: Negative and Slump test: Positive for R thigh symptoms  FUNCTIONAL TESTS:  30 seconds chair stand test  GAIT: Distance walked: 18ftx2 Assistive device utilized: None Level of assistance: Complete Independence Comments: flexed spine/hip posture  TODAY'S TREATMENT:    [facility] Adult PT Treatment:                                                DATE: 09/01/23 Therapeutic Exercise: Nustep L5 x 5 min while taking subjective Supine PPT x 15 - 5" hold Supine clamshell 2x15 blue band Bridge with blue band 2x10 Supine pilates SLR 2x10 Pallof press x 10 - 7# Manual Therapy: Skilled palpation of trigger points for TPDN around lumbar spine Trigger Point Dry Needling Treatment: Pre-treatment instruction: Patient instructed on dry needling rationale, procedures, and possible side effects including pain during treatment (achy,cramping feeling), bruising, drop of blood, lightheadedness, nausea, sweating. Patient Consent Given: Yes Education handout provided: No Muscles treated: right lumbar multifidi  Needle size and  number: .30x13mm x 3 Electrical stimulation performed: Yes Parameters: Milli x 5 min (low freq, low intensity); Micro x 5 min (high freq, high  intensity) Treatment response/outcome: Twitch response elicited and Palpable decrease in muscle tension Post-treatment instructions: Patient instructed to expect possible mild to moderate muscle soreness later today and/or tomorrow. Patient instructed in methods to reduce muscle soreness and to continue prescribed HEP. If patient was dry needled over the lung field, patient was instructed on signs and symptoms of pneumothorax and, however unlikely, to see immediate medical attention should they occur. Patient was also educated on signs and symptoms of infection and to seek medical attention should they occur. Patient verbalized understanding of these instructions and education.  [facility] Adult PT Treatment:                                                DATE: 08/27/23 Therapeutic Exercise: Nustep L4 6 min Introduced seated core exercises of hip tosses, shoulder tosses, chops and Victories, 15 reps with 3000g ball. Seated hamstring stretch 30s x2 B R piriformis stretch with towel 30s x2 Supine hip fallouts BluTB 15x B, 15/15 unilaterally S/L clams BluTB 15x B Prone on elbows 2 min R hip flexor stretch 30s x2 Manual Therapy: R piriformis release 2 min PA mobs L5-1 Grade III  [facility] Adult PT Treatment:                                                DATE: 08/25/23 Therapeutic Exercise: Nustep L5 8 min Introduced seated core exercises of hip tosses, shoulder tosses, chops and Victories, 15 reps with 3000g ball. Seated hamstring stretch 30s x2 B R piriformis stretch with towel 30s x2 Supine hip fallouts BluTB 15x B, 15/15 unilaterally S/L clams BluTB 15x B Manual Therapy: R piriformis release 2 min  [facility] Adult PT Treatment:                                                DATE: 08/18/23 Therapeutic Exercise: Nustep L4 8 min Introduced seated core exercises of hip tosses, shoulder tosses, chops and Victories, 15 reps with 2000g ball. Seated hamstring stretch 30s x2 B Prone on elbows 2 min Prone  press up 10x R piriformis stretch with towel 30s x2 Manual Therapy: R piriformis release 2 min PA mobs L5-1 Grade III  [facility] Adult PT Treatment:                                                DATE: 08/13/23 Therapeutic Exercise: Nustep L4 6 min Seated hamstring stretch 30s x2 B QL stretch 30s x2 B P-ball curl-ups 12x B, 12/12 unilaterally Piriformis stretch(fig 4) 30s x2 B Supine hip fallouts GTB 12x B, 12/12 unilaterally Prone on elbows 2 min Manual Therapy: R piriformis release 3 min       [facility] Adult PT Treatment:  DATE: 08/11/23 Therapeutic Exercise: Nustep L4 6 min Seated hamstring stretch 30s x2 B QL stretch 30s x2 B P-ball curl-ups 10x B, 10/10 unilaterally SKTC 30s x2 B Supine hip fallouts GTB 10x B, 10/10 unilaterally Manual Therapy: Piriformis release R 2 min                                                                                                                       DATE: 07/28/23 Eval and HEP    PATIENT EDUCATION:  Education details: Discussed eval findings, rehab rationale and POC and patient is in agreement  Person educated: Patient Education method: Explanation Education comprehension: verbalized understanding and needs further education  HOME EXERCISE PROGRAM: Access Code: [email or web address] URL: [email or web address] Date: 08/13/2023 Prepared by: Gustavus Bryant  Exercises - Hip Flexor Stretch at [facility] of Bed  - 2 x daily - 5 x weekly - 1 sets - 2 reps - 30s hold - Static Prone on Elbows  - 2 x daily - 5 x weekly - 1 sets - 1 reps - 2 min hold - Clamshell  - 2 x daily - 5 x weekly - 1 sets - 15 reps - Supine Piriformis Stretch with Foot on Ground  - 2 x daily - 5 x weekly - 1 sets - 2 reps - 30s hold - Hooklying Single Leg Bent Knee Fallouts with Resistance  - 2 x daily - 5 x weekly - 1 sets - 15 reps  ASSESSMENT:  CLINICAL IMPRESSION:  Pt was able to complete all prescribed exercises with no  adverse effect or increase in pain. Responded well to TDPN and ESTIM today, noting decrease tightness in lower back post session. Exercises focused on proximal hip and core strengthening or decreasing back and R LE pain. Will continue to be seen and progressed as able per POC.   EVAL: Patient is a 82 y.o. female who was seen today for physical therapy evaluation and treatment for ongoing R lumbosacral discomfort and R anterior thigh paresthesias ongoing since August.  Symptoms relieved with position changes.  5x STS time [facility], BLE flexibility is good throughout.  Lumbar ROM restricted in extension, mild R piriformis discomfort noted.  R hip flexor tightness and core weakness noted.  OBJECTIVE IMPAIRMENTS: Abnormal gait, decreased activity tolerance, decreased endurance, decreased knowledge of condition, decreased mobility, decreased ROM, decreased strength, increased fascial restrictions, impaired flexibility, improper body mechanics, postural dysfunction, and pain.   ACTIVITY LIMITATIONS: carrying, lifting, bending, sitting, and standing  PARTICIPATION LIMITATIONS: driving and golf  PERSONAL FACTORS: Age, Fitness, Past/current experiences, and Time since onset of injury/illness/exacerbation are also affecting patient's functional outcome.   REHAB POTENTIAL: Good  CLINICAL DECISION MAKING: Stable/uncomplicated  EVALUATION COMPLEXITY: Low   GOALS: Goals reviewed with patient? No  SHORT TERM GOALS: Target date: 08/18/2023    Patient to demonstrate independence in HEP Baseline: [id number] Goal status: Met  2.  Increase lumbar extension to 75% Baseline:  AROM eval  Flexion 75%  Extension 50%  Right lateral flexion  75%  Left lateral flexion 75%  Right rotation   Left rotation    Goal status: INITIAL    LONG TERM GOALS: Target date: 09/08/2023    Increase FOTO score to 71 Baseline: 67 Goal status: INITIAL  2.  Increase BLE strength to 4/5 Baseline:  MMT Right eval  Left eval  Hip flexion 4- 4-  Hip extension 4- 4-  Hip abduction 4- 4-  Hip adduction    Hip internal rotation    Hip external rotation    Knee flexion 4- 4-  Knee extension 4- 4-  Ankle dorsiflexion    Ankle plantarflexion 4- 4-   Goal status: INITIAL  3.  Decrease pain levels to 1/10 Baseline: 2-3 level of discomfort Goal status: INITIAL  4.  Resolve R piriformis tenderness Baseline: Moderate discomfort Goal status: INITIAL    PLAN:  PT FREQUENCY: 1-2x/week  PT DURATION: 6 weeks  PLANNED INTERVENTIONS: 97164- PT Re-evaluation, 97110-Therapeutic exercises, 97530- Therapeutic activity, 97112- Neuromuscular re-education, 97535- Self Care, [id number]- Manual therapy, [id number]- Gait training, 97014- Electrical stimulation (unattended), Patient/Family education, Dry Needling, Joint mobilization, Spinal mobilization, Cryotherapy, and Moist heat.  PLAN FOR NEXT SESSION: HEP review and update, manual techniques as appropriate, aerobic tasks, ROM and flexibility activities, strengthening and PREs, TPDN, gait and balance training as needed     Eloy End, PT 09/01/2023, 11:17 AM

## 2023-09-06 NOTE — Therapy (Unsigned)
OUTPATIENT PHYSICAL THERAPY TREATMENT NOTE  Patient Name: Patricia Cantu MRN: [id number] DOB:[birth year], 82 y.o., female Today's Date: 09/08/2023  END OF SESSION:  PT End of Session - 09/08/23 1003     Visit Number 8    Number of Visits 12    Date for PT Re-Evaluation 09/27/23    Authorization Type [employer]    Progress Note Due on Visit 10    PT Start Time 1000    PT Stop Time 1040    PT Time Calculation (min) 40 min    Activity Tolerance Patient tolerated treatment well    Behavior During Therapy WFL for tasks assessed/performed                    Past Medical History:  Diagnosis Date   Acute perichondritis of pinna    Allergic rhinitis due to pollen    Disorder of bone and cartilage, unspecified    Encounter for long-term (current) use of other medications    History of echocardiogram    Echo 9/18: EF 65-70   History of exercise stress test 06/2017   ETT 9/18: no ischemia; hypertensive BP response   HLD (hyperlipidemia)    Osteoarthritis    Panic disorder    Unspecified dermatitis due to sun    Unspecified hypothyroidism    Past Surgical History:  Procedure Laterality Date   BREAST BIOPSY Right 07/14/2016   BREAST BIOPSY Right 07/15/2016   cataract surgery Bilateral 10/12/2016   childbirth     x2 "blocks"   COLONOSCOPY     x2   TONSILLECTOMY  1949   removed   Patient Active Problem List   Diagnosis Date Noted   Chronic pain of both knees 08/25/2018   Generalized anxiety disorder with panic attacks 01/27/2018   Overweight (BMI 25.0-29.9) 01/27/2018   Family history of MI (myocardial infarction) 04/12/2017   Insomnia 11/23/2013   Premature atrial complexes 02/20/2013   Osteoarthritis of both knees 01/02/2013   Hyperlipidemia with target LDL less than 100 01/02/2013   Vitamin D deficiency 01/02/2013   Hypothyroidism 01/02/2013   Depressive disorder, not elsewhere classified 01/02/2013    PCP: [name], NP  REFERRING PROVIDER: [name], NP  REFERRING DIAG: [phone] (ICD-10-CM) - Chronic right-sided low back pain with right-sided sciatica  Rationale for Evaluation and Treatment: Rehabilitation  THERAPY DIAG:  Abnormal posture  Other low back pain  Muscle weakness (generalized)  ONSET DATE: August  SUBJECTIVE:  SUBJECTIVE STATEMENT: Describes continued R hip/gluteal symptoms as well as R lumbar discomfort.  Back pain resolved with lumbar support.  PERTINENT HISTORY:  Chronic right-sided low back pain with right-sided sciatica - Ambulatory referral to Physical Therapy for further evaluation and treatment  PAIN:  Are you having pain? Yes: NPRS scale: 5/10 Pain location: R lumbosacral region and R thigh Pain description: ache Aggravating factors: prolonged  Relieving factors: position changes  PRECAUTIONS: None  RED FLAGS: None   WEIGHT BEARING RESTRICTIONS: No  FALLS:  Has patient fallen in last 6 months? No  OCCUPATION: retired  PLOF: Independent  PATIENT GOALS: To get relief from my symptoms  NEXT MD VISIT: As needed  OBJECTIVE:  Note: Objective measures were completed at Evaluation unless otherwise noted.  DIAGNOSTIC FINDINGS:  none  PATIENT SURVEYS:  FOTO 67(71 predicted)  MUSCLE LENGTH: Hamstrings: Right 90 deg; Left 90 deg Thomas test: Right positive for symptoms  POSTURE: rounded shoulders, forward head, decreased lumbar lordosis, and increased thoracic kyphosis  PALPATION: TTP R piriformis  LUMBAR ROM:   AROM eval  Flexion 75%  Extension 50%  Right lateral flexion 75%  Left lateral flexion 75%  Right rotation   Left rotation    (Blank rows = not tested)  LOWER EXTREMITY ROM:   WNL  Passive  Right eval Left eval  Hip flexion    Hip extension    Hip abduction     Hip adduction    Hip internal rotation    Hip external rotation    Knee flexion    Knee extension    Ankle dorsiflexion    Ankle plantarflexion    Ankle inversion    Ankle eversion     (Blank rows = not tested)  LOWER EXTREMITY MMT:    MMT Right eval Left eval  Hip flexion 4- 4-  Hip extension 4- 4-  Hip abduction 4- 4-  Hip adduction    Hip internal rotation    Hip external rotation    Knee flexion 4- 4-  Knee extension 4- 4-  Ankle dorsiflexion    Ankle plantarflexion 4- 4-  Ankle inversion    Ankle eversion     (Blank rows = not tested)  LUMBAR SPECIAL TESTS:  Straight leg raise test: Negative and Slump test: Positive for R thigh symptoms  FUNCTIONAL TESTS:  30 seconds chair stand test  GAIT: Distance walked: 59ftx2 Assistive device utilized: None Level of assistance: Complete Independence Comments: flexed spine/hip posture  TODAY'S TREATMENT:    [facility] Adult PT Treatment:                                                DATE: 09/08/23 Therapeutic Exercise: Nustep L4  R piriformis stretch 30s x2 R clamshells 15x2 R QL stretch 30s x2 Manual Therapy: Skilled palpation to identify taught and irritable bands in R gluteus medius Trigger Point Dry Needling Treatment: Pre-treatment instruction: Patient instructed on dry needling rationale, procedures, and possible side effects including pain during treatment (achy,cramping feeling), bruising, drop of blood, lightheadedness, nausea, sweating. Patient Consent Given: Yes Education handout provided: Previously provided Muscles treated: R   Needle size and number: .30x1mm x 1 Electrical stimulation performed: No Parameters: N/A Treatment response/outcome: Twitch response elicited and Palpable decrease in muscle tension Post-treatment instructions: Patient instructed to expect possible mild to moderate muscle soreness later today and/or  tomorrow. Patient instructed in methods to reduce muscle soreness and to continue  prescribed HEP. If patient was dry needled over the lung field, patient was instructed on signs and symptoms of pneumothorax and, however unlikely, to see immediate medical attention should they occur. Patient was also educated on signs and symptoms of infection and to seek medical attention should they occur. Patient verbalized understanding of these instructions and education.   [facility] Adult PT Treatment:                                                DATE: 09/01/23 Therapeutic Exercise: Nustep L5 x 5 min while taking subjective Supine PPT x 15 - 5" hold Supine clamshell 2x15 blue band Bridge with blue band 2x10 Supine pilates SLR 2x10 Pallof press x 10 - 7# Manual Therapy: Skilled palpation of trigger points for TPDN around lumbar spine Trigger Point Dry Needling Treatment: Pre-treatment instruction: Patient instructed on dry needling rationale, procedures, and possible side effects including pain during treatment (achy,cramping feeling), bruising, drop of blood, lightheadedness, nausea, sweating. Patient Consent Given: Yes Education handout provided: No Muscles treated: right lumbar multifidi  Needle size and number: .30x73mm x 3 Electrical stimulation performed: Yes Parameters: Milli x 5 min (low freq, low intensity); Micro x 5 min (high freq, high intensity) Treatment response/outcome: Twitch response elicited and Palpable decrease in muscle tension Post-treatment instructions: Patient instructed to expect possible mild to moderate muscle soreness later today and/or tomorrow. Patient instructed in methods to reduce muscle soreness and to continue prescribed HEP. If patient was dry needled over the lung field, patient was instructed on signs and symptoms of pneumothorax and, however unlikely, to see immediate medical attention should they occur. Patient was also educated on signs and symptoms of infection and to seek medical attention should they occur. Patient verbalized understanding of  these instructions and education.  [facility] Adult PT Treatment:                                                DATE: 08/27/23 Therapeutic Exercise: Nustep L4 6 min Introduced seated core exercises of hip tosses, shoulder tosses, chops and Victories, 15 reps with 3000g ball. Seated hamstring stretch 30s x2 B R piriformis stretch with towel 30s x2 Supine hip fallouts BluTB 15x B, 15/15 unilaterally S/L clams BluTB 15x B Prone on elbows 2 min R hip flexor stretch 30s x2 Manual Therapy: R piriformis release 2 min PA mobs L5-1 Grade III  [facility] Adult PT Treatment:                                                DATE: 08/25/23 Therapeutic Exercise: Nustep L5 8 min Introduced seated core exercises of hip tosses, shoulder tosses, chops and Victories, 15 reps with 3000g ball. Seated hamstring stretch 30s x2 B R piriformis stretch with towel 30s x2 Supine hip fallouts BluTB 15x B, 15/15 unilaterally S/L clams BluTB 15x B Manual Therapy: R piriformis release 2 min  [facility] Adult PT Treatment:  DATE: 08/18/23 Therapeutic Exercise: Nustep L4 8 min Introduced seated core exercises of hip tosses, shoulder tosses, chops and Victories, 15 reps with 2000g ball. Seated hamstring stretch 30s x2 B Prone on elbows 2 min Prone press up 10x R piriformis stretch with towel 30s x2 Manual Therapy: R piriformis release 2 min PA mobs L5-1 Grade III  [facility] Adult PT Treatment:                                                DATE: 08/13/23 Therapeutic Exercise: Nustep L4 6 min Seated hamstring stretch 30s x2 B QL stretch 30s x2 B P-ball curl-ups 12x B, 12/12 unilaterally Piriformis stretch(fig 4) 30s x2 B Supine hip fallouts GTB 12x B, 12/12 unilaterally Prone on elbows 2 min Manual Therapy: R piriformis release 3 min       [facility] Adult PT Treatment:                                                DATE: 08/11/23 Therapeutic Exercise: Nustep L4 6 min Seated hamstring  stretch 30s x2 B QL stretch 30s x2 B P-ball curl-ups 10x B, 10/10 unilaterally SKTC 30s x2 B Supine hip fallouts GTB 10x B, 10/10 unilaterally Manual Therapy: Piriformis release R 2 min                                                                                                                       DATE: 07/28/23 Eval and HEP    PATIENT EDUCATION:  Education details: Discussed eval findings, rehab rationale and POC and patient is in agreement  Person educated: Patient Education method: Explanation Education comprehension: verbalized understanding and needs further education  HOME EXERCISE PROGRAM: Access Code: [email or web address] URL: [email or web address] Date: 08/13/2023 Prepared by: Gustavus Bryant  Exercises - Hip Flexor Stretch at [facility] of Bed  - 2 x daily - 5 x weekly - 1 sets - 2 reps - 30s hold - Static Prone on Elbows  - 2 x daily - 5 x weekly - 1 sets - 1 reps - 2 min hold - Clamshell  - 2 x daily - 5 x weekly - 1 sets - 15 reps - Supine Piriformis Stretch with Foot on Ground  - 2 x daily - 5 x weekly - 1 sets - 2 reps - 30s hold - Hooklying Single Leg Bent Knee Fallouts with Resistance  - 2 x daily - 5 x weekly - 1 sets - 15 reps  ASSESSMENT:  CLINICAL IMPRESSION: Focus of today was TPDN to R hip region/gluteus medius with exacerbation of symptoms reported.  Followed up with stretching tasks, light aerobic work and gentle PREs.  Good tolerance to TPDN and will assess benefit at next session.  EVAL: Patient is a 82 y.o. female who was seen today for physical therapy evaluation and treatment for ongoing R lumbosacral discomfort and R anterior thigh paresthesias ongoing since August.  Symptoms relieved with position changes.  5x STS time [facility], BLE flexibility is good throughout.  Lumbar ROM restricted in extension, mild R piriformis discomfort noted.  R hip flexor tightness and core weakness noted.  OBJECTIVE IMPAIRMENTS: Abnormal gait, decreased activity tolerance,  decreased endurance, decreased knowledge of condition, decreased mobility, decreased ROM, decreased strength, increased fascial restrictions, impaired flexibility, improper body mechanics, postural dysfunction, and pain.   ACTIVITY LIMITATIONS: carrying, lifting, bending, sitting, and standing  PARTICIPATION LIMITATIONS: driving and golf  PERSONAL FACTORS: Age, Fitness, Past/current experiences, and Time since onset of injury/illness/exacerbation are also affecting patient's functional outcome.   REHAB POTENTIAL: Good  CLINICAL DECISION MAKING: Stable/uncomplicated  EVALUATION COMPLEXITY: Low   GOALS: Goals reviewed with patient? No  SHORT TERM GOALS: Target date: 08/18/2023    Patient to demonstrate independence in HEP Baseline: [id number] Goal status: Met  2.  Increase lumbar extension to 75% Baseline:  AROM eval  Flexion 75%  Extension 50%  Right lateral flexion 75%  Left lateral flexion 75%  Right rotation   Left rotation    Goal status: INITIAL    LONG TERM GOALS: Target date: 09/08/2023    Increase FOTO score to 71 Baseline: 67 Goal status: INITIAL  2.  Increase BLE strength to 4/5 Baseline:  MMT Right eval Left eval  Hip flexion 4- 4-  Hip extension 4- 4-  Hip abduction 4- 4-  Hip adduction    Hip internal rotation    Hip external rotation    Knee flexion 4- 4-  Knee extension 4- 4-  Ankle dorsiflexion    Ankle plantarflexion 4- 4-   Goal status: INITIAL  3.  Decrease pain levels to 1/10 Baseline: 2-3 level of discomfort Goal status: INITIAL  4.  Resolve R piriformis tenderness Baseline: Moderate discomfort Goal status: INITIAL    PLAN:  PT FREQUENCY: 1-2x/week  PT DURATION: 6 weeks  PLANNED INTERVENTIONS: 97164- PT Re-evaluation, 97110-Therapeutic exercises, 97530- Therapeutic activity, 97112- Neuromuscular re-education, 97535- Self Care, 16109- Manual therapy, [id number]- Gait training, 97014- Electrical stimulation (unattended),  Patient/Family education, Dry Needling, Joint mobilization, Spinal mobilization, Cryotherapy, and Moist heat.  PLAN FOR NEXT SESSION: HEP review and update, manual techniques as appropriate, aerobic tasks, ROM and flexibility activities, strengthening and PREs, TPDN, gait and balance training as needed     Hildred Laser, PT 09/08/2023, 10:44 AM

## 2023-09-08 ENCOUNTER — Ambulatory Visit: Payer: Medicare Other | Attending: Nurse Practitioner

## 2023-09-08 DIAGNOSIS — M5459 Other low back pain: Secondary | ICD-10-CM | POA: Diagnosis not present

## 2023-09-08 DIAGNOSIS — R293 Abnormal posture: Secondary | ICD-10-CM | POA: Insufficient documentation

## 2023-09-08 DIAGNOSIS — M6281 Muscle weakness (generalized): Secondary | ICD-10-CM | POA: Diagnosis not present

## 2023-09-10 ENCOUNTER — Ambulatory Visit (INDEPENDENT_AMBULATORY_CARE_PROVIDER_SITE_OTHER): Payer: Medicare Other | Admitting: Nurse Practitioner

## 2023-09-10 DIAGNOSIS — E2839 Other primary ovarian failure: Secondary | ICD-10-CM

## 2023-09-10 DIAGNOSIS — Z0000 Encounter for general adult medical examination without abnormal findings: Secondary | ICD-10-CM

## 2023-09-10 NOTE — Progress Notes (Signed)
   This service is provided via telemedicine  No vital signs collected/recorded due to the encounter was a telemedicine visit.   Location of patient (ex: home, work):  Home  Patient consents to a telephone visit: Yes  Location of the provider (ex: office, home):  [facility], Office   Name of any referring provider:  N/A  Names of all persons participating in the telemedicine service and their role in the encounter:  S.Chrae B/CMA, Abbey Chatters, NP, and Patient   Time spent on call:  9 min with medical assistant

## 2023-09-10 NOTE — Progress Notes (Signed)
Subjective:   Patricia Cantu is a 82 y.o. female who presents for Medicare Annual (Subsequent) preventive examination.  Visit Complete: Virtual I connected with  Dellie Burns on 09/10/23 by a video and audio enabled telemedicine application and verified that I am speaking with the correct person using two identifiers.  Patient Location: Home  Provider Location: Office/Clinic  I discussed the limitations of evaluation and management by telemedicine. The patient expressed understanding and agreed to proceed.  Vital Signs: Because this visit was a virtual/telehealth visit, some criteria may be missing or patient reported. Any vitals not documented were not able to be obtained and vitals that have been documented are patient reported.    Cardiac Risk Factors include: advanced age (>52men, >36 women);hypertension;dyslipidemia     Objective:    There were no vitals filed for this visit. There is no height or weight on file to calculate BMI.     09/10/2023    3:20 PM 07/28/2023   10:44 AM 05/10/2023    8:57 AM 03/19/2023   10:01 AM 01/14/2023    8:34 AM 11/02/2022    1:53 PM 09/17/2022   10:06 AM  Advanced Directives  Does Patient Have a Medical Advance Directive? Yes Yes Yes Yes Yes Yes Yes  Type of Advance Directive Living will  Living will Healthcare Power of [place];Living will Living will;Healthcare Power of [place];Out of facility DNR (pink MOST or yellow form) Living will Living will  Does patient want to make changes to medical advance directive? No - Patient declined No - Patient declined No - Patient declined No - Patient declined No - Patient declined No - Patient declined No - Patient declined  Copy of Healthcare Power of Attorney in Chart?    Yes - validated most recent copy scanned in chart (See row information) No - copy requested      Current Medications (verified) Outpatient Encounter Medications as of 09/10/2023  Medication Sig   acetaminophen (TYLENOL) 650 MG CR  tablet Take 1,300 mg by mouth in the morning and at bedtime.   aspirin EC 81 MG tablet Take 81 mg by mouth daily. Swallow whole.   atorvastatin (LIPITOR) 10 MG tablet TAKE 1 TABLET BY MOUTH DAILY FOR CHOLESTEROL   CALCIUM PO Take 1,000 mg by mouth daily.    Cholecalciferol (VITAMIN D3) 125 MCG (5000 UT) CAPS Take one capsule by mouth once daily.   citalopram (CELEXA) 20 MG tablet Take 1 tablet (20 mg total) by mouth daily.   diclofenac Sodium (VOLTAREN) 1 % GEL Apply 4 g topically 4 (four) times daily as needed.   famotidine (PEPCID) 10 MG tablet Take 1 tablet (10 mg total) by mouth 2 (two) times daily as needed for heartburn or indigestion.   fish oil-omega-3 fatty acids 1000 MG capsule Take 1 g by mouth daily.    fluconazole (DIFLUCAN) 150 MG tablet Take one tablet then repeat after 72 hours   fluticasone (FLONASE) 50 MCG/ACT nasal spray Place 1 spray into both nostrils daily.   levothyroxine (SYNTHROID) 50 MCG tablet TAKE 1 TABLET BY MOUTH ONCE DAILY 30 MINUTES PRIOR TO BREAKFAST FOR THYROID   losartan (COZAAR) 50 MG tablet Take 1 tablet (50 mg total) by mouth daily.   Multiple Vitamin (MULTIVITAMIN) tablet Take 1 tablet by mouth daily.   nystatin cream (MYCOSTATIN) Apply 1 Application topically 2 (two) times daily as needed.   triamcinolone cream (KENALOG) 0.1 % Apply 1 application topically daily as needed (itching).    No  facility-administered encounter medications on file as of 09/10/2023.    Allergies (verified) Shellfish allergy   History: Past Medical History:  Diagnosis Date   Acute perichondritis of pinna    Allergic rhinitis due to pollen    Disorder of bone and cartilage, unspecified    Encounter for long-term (current) use of other medications    History of echocardiogram    Echo 9/18: EF 65-70   History of exercise stress test 06/2017   ETT 9/18: no ischemia; hypertensive BP response   HLD (hyperlipidemia)    Osteoarthritis    Panic disorder    Unspecified  dermatitis due to sun    Unspecified hypothyroidism    Past Surgical History:  Procedure Laterality Date   BREAST BIOPSY Right 07/14/2016   BREAST BIOPSY Right 07/15/2016   cataract surgery Bilateral 10/12/2016   childbirth     x2 "blocks"   COLONOSCOPY     x2   TONSILLECTOMY  1949   removed   Family History  Problem Relation Age of Onset   Stroke Mother    Heart failure Mother 80   Hypertension Mother    Heart attack Father 75   Hyperlipidemia Sister    Atrial fibrillation Sister    Cancer Sister        Stomach Area   Thyroid disease Sister    Atrial fibrillation Brother    Breast cancer Maternal Grandmother 10   Thyroid disease Daughter    Fibromyalgia Daughter    Heart disease Son    Thyroid disease Son    Thyroid disease Son    Colon cancer Neg Hx    Social History   Socioeconomic History   Marital status: Married    Spouse name: Not on file   Number of children: Not on file   Years of education: Not on file   Highest education level: Master's degree (e.g., MA, MS, MEng, MEd, MSW, MBA)  Occupational History   Occupation: retired  Tobacco Use   Smoking status: Never   Smokeless tobacco: Never  Vaping Use   Vaping status: Never Used  Substance and Sexual Activity   Alcohol use: Yes    Alcohol/week: 2.0 standard drinks of alcohol    Types: 2 Glasses of wine per week   Drug use: No   Sexual activity: Yes  Other Topics Concern   Not on file  Social History Narrative   She is retired. 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 She is married and has 3 children and 6 grandchildren. She is originally from [place]. She has lived in [place]. She lived in [place] for 7 years. She denies tobacco abuse. She drinks alcohol on occasion.   Social Determinants of Health   Financial Resource Strain: Low Risk  (03/15/2023)   Overall Financial Resource Strain (CARDIA)    Difficulty of Paying Living  Expenses: Not hard at all  Food Insecurity: No Food Insecurity (03/15/2023)   Hunger Vital Sign    Worried About Running Out of Food in the Last Year: Never true    Ran Out of Food in the Last Year: Never true  Transportation Needs: No Transportation Needs (03/15/2023)   PRAPARE - Administrator, Civil Service (Medical): No    Lack of Transportation (Non-Medical): No  Physical Activity: Sufficiently Active (03/15/2023)   Exercise Vital Sign    Days of Exercise per Week: 3 days    Minutes  of Exercise per Session: 100 min  Stress: No Stress Concern Present (03/15/2023)   [employer] of Occupational Health - Occupational Stress Questionnaire    Feeling of Stress : Only a little  Social Connections: Socially Integrated (03/15/2023)   Social Connection and Isolation Panel [NHANES]    Frequency of Communication with Friends and Family: Three times a week    Frequency of Social Gatherings with Friends and Family: Three times a week    Attends Religious Services: More than 4 times per year    Active Member of Clubs or Organizations: Yes    Attends Engineer, structural: More than 4 times per year    Marital Status: Married    Tobacco Counseling Counseling given: Not Answered   Clinical Intake:  Pre-visit preparation completed: Yes  Pain : No/denies pain     BMI - recorded: 31 Nutritional Status: BMI > 30  Obese Diabetes: No  How often do you need to have someone help you when you read instructions, pamphlets, or other written materials from your doctor or pharmacy?: 1 - Never         Activities of Daily Living    09/10/2023    3:47 PM  In your present state of health, do you have any difficulty performing the following activities:  Hearing? 1  Vision? 0  Difficulty concentrating or making decisions? 0  Walking or climbing stairs? 0  Dressing or bathing? 0  Doing errands, shopping? 0  Preparing Food and eating ? N  Using the Toilet? N  In the  past six months, have you accidently leaked urine? Y  Do you have problems with loss of bowel control? N  Managing your Medications? N  Managing your Finances? N  Housekeeping or managing your Housekeeping? N    Patient Care Team: [name], NP as PCP - General (Geriatric Medicine) Blair Promise, OD (Optometry)  Indicate any recent Medical Services you may have received from other than [facility] providers in the past year (date may be approximate).     Assessment:   This is a routine wellness examination for Patricia Cantu.  Hearing/Vision screen Hearing Screening - Comments:: Hearing loss in both ears, seen audiologist. Recommended 1 year follow-up  Vision Screening - Comments:: Last eye exam less than 12 months ago, BULAKOWSKI, NEILL    Goals Addressed   None    Depression Screen    09/10/2023    3:18 PM 01/14/2023    8:35 AM 09/17/2022   10:06 AM 09/07/2022    2:52 PM 10/31/2021    8:06 AM 08/12/2021    3:38 PM 09/19/2020    9:10 AM  PHQ 2/9 Scores  PHQ - 2 Score 0 0 0 0 0 0 0    Fall Risk    09/10/2023    3:18 PM 09/10/2023    3:17 PM 05/10/2023    9:11 AM 03/19/2023   10:01 AM 01/14/2023    8:34 AM  Fall Risk   Falls in the past year? 0 0 1 0 0  Number falls in past yr: 0  0 0 0  Injury with Fall? 0  0 0 0  Risk for fall due to : No Fall Risks  No Fall Risks No Fall Risks   Follow up Falls evaluation completed  Falls evaluation completed Falls evaluation completed     MEDICARE RISK AT HOME: Medicare Risk at Home Any stairs in or around the home?: Yes If so, are there any without handrails?:  No Home free of loose throw rugs in walkways, pet beds, electrical cords, etc?: Yes Adequate lighting in your home to reduce risk of falls?: Yes Life alert?: No Use of a cane, walker or w/c?: No Grab bars in the bathroom?: Yes Shower chair or bench in shower?: No Elevated toilet seat or a handicapped toilet?: Yes  TIMED UP AND GO:  Was the test performed?  No     Cognitive Function:    08/08/2019    2:26 PM 08/01/2018    9:40 AM 12/17/2016    9:20 AM 11/28/2015    1:48 PM  MMSE - Mini Mental State Exam  Not completed:    --  Orientation to time 5 5 5 5   Orientation to Place 5 5 5 5   Registration 3 3 3 3   Attention/ Calculation 5 5 5 5   Recall 3 3 3 2   Language- name 2 objects 2 2 2 2   Language- repeat 1 1 1 1   Language- follow 3 step command 3 3 3 3   Language- read & follow direction 1 1 1 1   Write a sentence 1 1 1 1   Copy design 1 1 1 1   Total score 30 30 30 29         09/10/2023    3:20 PM 09/07/2022    2:55 PM 08/12/2021    3:43 PM 08/09/2020    1:10 PM  6CIT Screen  What Year? 0 points 0 points 0 points 0 points  What month? 0 points 0 points 0 points 0 points  What time? 0 points 0 points 0 points 0 points  Count back from 20 0 points 0 points 0 points 2 points  Months in reverse 0 points 0 points 0 points 0 points  Repeat phrase 2 points 0 points 0 points 0 points  Total Score 2 points 0 points 0 points 2 points    Immunizations Immunization History  Administered Date(s) Administered   Fluad Quad(high Dose 65+) 08/08/2019, 06/19/2021   Hepatitis A 02/17/2013   Hepatitis A, Adult 01/18/2014   Hepatitis B 07/22/2006, 08/24/2006, 01/21/2007   Influenza, High Dose Seasonal PF 06/21/2017, 08/01/2018, 08/26/2020, 07/06/2022, 07/06/2023   Influenza,inj,Quad PF,6+ Mos 06/15/2013, 08/03/2014, 07/26/2015, 05/28/2016   Moderna Covid-19 Fall Seasonal Vaccine 69yrs & older 07/06/2023   Moderna Covid-19 Vaccine Bivalent Booster 35yrs & up 07/12/2022   PFIZER(Purple Top)SARS-COV-2 Vaccination 10/17/2019, 11/06/2019, 07/10/2020, 01/03/2021   Pfizer Covid-19 Vaccine Bivalent Booster 73yrs & up 08/08/2021   Pneumococcal Conjugate-13 11/29/2014   Pneumococcal Polysaccharide-23 10/05/2021   Respiratory Syncytial Virus Vaccine,Recomb Aduvanted(Arexvy) 07/20/2022   Td 10/06/2007   Tdap 03/15/2018   Zoster Recombinant(Shingrix) 02/02/2018,  04/25/2018    TDAP status: Up to date  Flu Vaccine status: Up to date  Pneumococcal vaccine status: Up to date  Covid-19 vaccine status: Information provided on how to obtain vaccines.   Qualifies for Shingles Vaccine? Yes   Zostavax completed No   Shingrix Completed?: Yes  Screening Tests Health Maintenance  Topic Date Due   COVID-19 Vaccine (8 - 2023-24 season) 11/06/2023   Medicare Annual Wellness (AWV)  09/09/2024   DTaP/Tdap/Td (3 - Td or Tdap) 03/15/2028   Pneumonia Vaccine 67+ Years old  Completed   INFLUENZA VACCINE  Completed   DEXA SCAN  Completed   Zoster Vaccines- Shingrix  Completed   HPV VACCINES  Aged Out   Hepatitis C Screening  Discontinued    Health Maintenance  There are no preventive care reminders to display for this patient.  Colorectal cancer screening: No longer required.   Mammogram status: No longer required due to age.  Bone Density status: Completed march 22. Results reflect: Bone density results: OSTEOPENIA. Repeat every 2 years.  Lung Cancer Screening: (Low Dose CT Chest recommended if Age 52-80 years, 20 pack-year currently smoking OR have quit w/in 15years.) does not qualify.   Lung Cancer Screening Referral: na  Additional Screening:  Hepatitis C Screening: does not qualify; Completed   Vision Screening: Recommended annual ophthalmology exams for early detection of glaucoma and other disorders of the eye. Is the patient up to date with their annual eye exam?  Yes  Who is the provider or what is the name of the office in which the patient attends annual eye exams? [facility] If pt is not established with a provider, would they like to be referred to a provider to establish care? No .   Dental Screening: Recommended annual dental exams for proper oral hygiene    Community Resource Referral / Chronic Care Management: CRR required this visit?  No   CCM required this visit?  No     Plan:     I have personally  reviewed and noted the following in the patient's chart:   Medical and social history Use of alcohol, tobacco or illicit drugs  Current medications and supplements including opioid prescriptions. Patient is not currently taking opioid prescriptions. Functional ability and status Nutritional status Physical activity Advanced directives List of other physicians Hospitalizations, surgeries, and ER visits in previous 12 months Vitals Screenings to include cognitive, depression, and falls Referrals and appointments  In addition, I have reviewed and discussed with patient certain preventive protocols, quality metrics, and best practice recommendations. A written personalized care plan for preventive services as well as general preventive health recommendations were provided to patient.     [name], NP   09/10/2023   After Visit Summary: (MyChart) Due to this being a telephonic visit, the after visit summary with patients personalized plan was offered to patient via MyChart

## 2023-09-15 ENCOUNTER — Ambulatory Visit: Payer: Medicare Other

## 2023-09-24 NOTE — Therapy (Addendum)
 OUTPATIENT PHYSICAL THERAPY TREATMENT NOTE/DISCHARGE SUMMARY  Patient Name: Patricia Cantu MRN: [id number] DOB:[birth year], 82 y.o., female Today's Date: 09/27/2023  PHYSICAL THERAPY DISCHARGE SUMMARY  Visits from Start of Care: 9  Current functional level related to goals / functional outcomes: Goals met   Remaining deficits: none   Education / Equipment: HEP   Patient agrees to discharge. Patient goals were met. Patient is being discharged due to being pleased with the current functional level.   END OF SESSION:  PT End of Session - 09/27/23 1400     Visit Number 9    Number of Visits 12    Date for PT Re-Evaluation 09/27/23    Authorization Type BC[employer]    Progress Note Due on Visit 10    PT Start Time 1400    PT Stop Time 1440    PT Time Calculation (min) 40 min    Activity Tolerance Patient tolerated treatment well    Behavior During Therapy WFL for tasks assessed/performed                     Past Medical History:  Diagnosis Date   Acute perichondritis of pinna    Allergic rhinitis due to pollen    Disorder of bone and cartilage, unspecified    Encounter for long-term (current) use of other medications    History of echocardiogram    Echo 9/18: EF 65-70   History of exercise stress test 06/2017   ETT 9/18: no ischemia; hypertensive BP response   HLD (hyperlipidemia)    Osteoarthritis    Panic disorder    Unspecified dermatitis due to sun    Unspecified hypothyroidism    Past Surgical History:  Procedure Laterality Date   BREAST BIOPSY Right 07/14/2016   BREAST BIOPSY Right 07/15/2016   cataract surgery Bilateral 10/12/2016   childbirth     x2 "blocks"   COLONOSCOPY     x2   TONSILLECTOMY  1949   removed   Patient Active Problem List   Diagnosis Date Noted   Chronic pain of both knees 08/25/2018   Generalized anxiety disorder with panic attacks 01/27/2018   Overweight (BMI 25.0-29.9) 01/27/2018   Family history of MI (myocardial  infarction) 04/12/2017   Insomnia 11/23/2013   Premature atrial complexes 02/20/2013   Osteoarthritis of both knees 01/02/2013   Hyperlipidemia with target LDL less than 100 01/02/2013   Vitamin D deficiency 01/02/2013   Hypothyroidism 01/02/2013   Depressive disorder, not elsewhere classified 01/02/2013    PCP: [name], NP  REFERRING PROVIDER: [name], NP  REFERRING DIAG: [phone] (ICD-10-CM) - Chronic right-sided low back pain with right-sided sciatica  Rationale for Evaluation and Treatment: Rehabilitation  THERAPY DIAG:  Abnormal posture  Other low back pain  Muscle weakness (generalized)  ONSET DATE: August  SUBJECTIVE:  SUBJECTIVE STATEMENT: Doing very well. Only notes infrequent levels of discomfort not enough to limit activity  PERTINENT HISTORY:  Chronic right-sided low back pain with right-sided sciatica - Ambulatory referral to Physical Therapy for further evaluation and treatment  PAIN:  Are you having pain? Yes: NPRS scale: 1-2/10 Pain location: R lumbosacral region and R thigh Pain description: ache Aggravating factors: prolonged  Relieving factors: position changes  PRECAUTIONS: None  RED FLAGS: None   WEIGHT BEARING RESTRICTIONS: No  FALLS:  Has patient fallen in last 6 months? No  OCCUPATION: retired  PLOF: Independent  PATIENT GOALS: To get relief from my symptoms  NEXT MD VISIT: As needed  OBJECTIVE:  Note: Objective measures were completed at Evaluation unless otherwise noted.  DIAGNOSTIC FINDINGS:  none  PATIENT SURVEYS:  FOTO 67(71 predicted)  09/27/23 84%  MUSCLE LENGTH: Hamstrings: Right 90 deg; Left 90 deg Thomas test: Right positive for symptoms  POSTURE: rounded shoulders, forward head, decreased lumbar  lordosis, and increased thoracic kyphosis  PALPATION: TTP R piriformis  LUMBAR ROM:   AROM eval 09/27/23  Flexion 75% 75%  Extension 50% 75%  Right lateral flexion 75% 75%  Left lateral flexion 75% 75%  Right rotation    Left rotation     (Blank rows = not tested)  LOWER EXTREMITY ROM:   WNL  Passive  Right eval Left eval  Hip flexion    Hip extension    Hip abduction    Hip adduction    Hip internal rotation    Hip external rotation    Knee flexion    Knee extension    Ankle dorsiflexion    Ankle plantarflexion    Ankle inversion    Ankle eversion     (Blank rows = not tested)  LOWER EXTREMITY MMT:    MMT Right eval Left eval  Hip flexion 4- 4-  Hip extension 4- 4-  Hip abduction 4- 4-  Hip adduction    Hip internal rotation    Hip external rotation    Knee flexion 4- 4-  Knee extension 4- 4-  Ankle dorsiflexion    Ankle plantarflexion 4- 4-  Ankle inversion    Ankle eversion     (Blank rows = not tested)  LUMBAR SPECIAL TESTS:  Straight leg raise test: Negative and Slump test: Positive for R thigh symptoms  FUNCTIONAL TESTS:  09/27/23 5x STS 12s arms crossed   GAIT: Distance walked: 10ftx2 Assistive device utilized: None Level of assistance: Complete Independence Comments: flexed spine/hip posture  TODAY'S TREATMENT:    [facility] Adult PT Treatment:                                                DATE: 09/27/23 Therapeutic Exercise: PKB with strap 30s x2 R abduction with extension bias 10x HEP review and update Re-assessment  [facility] Adult PT Treatment:                                                DATE: 09/08/23 Therapeutic Exercise: Nustep L4  R piriformis stretch 30s x2 R clamshells 15x2 R QL stretch 30s x2 Manual Therapy: Skilled palpation to identify taught and irritable bands in R gluteus medius Trigger Point Dry Needling  Treatment: Pre-treatment instruction: Patient instructed on dry needling rationale, procedures, and possible side  effects including pain during treatment (achy,cramping feeling), bruising, drop of blood, lightheadedness, nausea, sweating. Patient Consent Given: Yes Education handout provided: Previously provided Muscles treated: R   Needle size and number: .30x39mm x 1 Electrical stimulation performed: No Parameters: N/A Treatment response/outcome: Twitch response elicited and Palpable decrease in muscle tension Post-treatment instructions: Patient instructed to expect possible mild to moderate muscle soreness later today and/or tomorrow. Patient instructed in methods to reduce muscle soreness and to continue prescribed HEP. If patient was dry needled over the lung field, patient was instructed on signs and symptoms of pneumothorax and, however unlikely, to see immediate medical attention should they occur. Patient was also educated on signs and symptoms of infection and to seek medical attention should they occur. Patient verbalized understanding of these instructions and education.   [facility] Adult PT Treatment:                                                DATE: 09/01/23 Therapeutic Exercise: Nustep L5 x 5 min while taking subjective Supine PPT x 15 - 5" hold Supine clamshell 2x15 blue band Bridge with blue band 2x10 Supine pilates SLR 2x10 Pallof press x 10 - 7# Manual Therapy: Skilled palpation of trigger points for TPDN around lumbar spine Trigger Point Dry Needling Treatment: Pre-treatment instruction: Patient instructed on dry needling rationale, procedures, and possible side effects including pain during treatment (achy,cramping feeling), bruising, drop of blood, lightheadedness, nausea, sweating. Patient Consent Given: Yes Education handout provided: No Muscles treated: right lumbar multifidi  Needle size and number: .30x4mm x 3 Electrical stimulation performed: Yes Parameters: Milli x 5 min (low freq, low intensity); Micro x 5 min (high freq, high intensity) Treatment response/outcome:  Twitch response elicited and Palpable decrease in muscle tension Post-treatment instructions: Patient instructed to expect possible mild to moderate muscle soreness later today and/or tomorrow. Patient instructed in methods to reduce muscle soreness and to continue prescribed HEP. If patient was dry needled over the lung field, patient was instructed on signs and symptoms of pneumothorax and, however unlikely, to see immediate medical attention should they occur. Patient was also educated on signs and symptoms of infection and to seek medical attention should they occur. Patient verbalized understanding of these instructions and education.  [facility] Adult PT Treatment:                                                DATE: 08/27/23 Therapeutic Exercise: Nustep L4 6 min Introduced seated core exercises of hip tosses, shoulder tosses, chops and Victories, 15 reps with 3000g ball. Seated hamstring stretch 30s x2 B R piriformis stretch with towel 30s x2 Supine hip fallouts BluTB 15x B, 15/15 unilaterally S/L clams BluTB 15x B Prone on elbows 2 min R hip flexor stretch 30s x2 Manual Therapy: R piriformis release 2 min PA mobs L5-1 Grade III  [facility] Adult PT Treatment:  DATE: 08/25/23 Therapeutic Exercise: Nustep L5 8 min Introduced seated core exercises of hip tosses, shoulder tosses, chops and Victories, 15 reps with 3000g ball. Seated hamstring stretch 30s x2 B R piriformis stretch with towel 30s x2 Supine hip fallouts BluTB 15x B, 15/15 unilaterally S/L clams BluTB 15x B Manual Therapy: R piriformis release 2 min  [facility] Adult PT Treatment:                                                DATE: 08/18/23 Therapeutic Exercise: Nustep L4 8 min Introduced seated core exercises of hip tosses, shoulder tosses, chops and Victories, 15 reps with 2000g ball. Seated hamstring stretch 30s x2 B Prone on elbows 2 min Prone press up 10x R piriformis stretch with  towel 30s x2 Manual Therapy: R piriformis release 2 min PA mobs L5-1 Grade III  [facility] Adult PT Treatment:                                                DATE: 08/13/23 Therapeutic Exercise: Nustep L4 6 min Seated hamstring stretch 30s x2 B QL stretch 30s x2 B P-ball curl-ups 12x B, 12/12 unilaterally Piriformis stretch(fig 4) 30s x2 B Supine hip fallouts GTB 12x B, 12/12 unilaterally Prone on elbows 2 min Manual Therapy: R piriformis release 3 min       [facility] Adult PT Treatment:                                                DATE: 08/11/23 Therapeutic Exercise: Nustep L4 6 min Seated hamstring stretch 30s x2 B QL stretch 30s x2 B P-ball curl-ups 10x B, 10/10 unilaterally SKTC 30s x2 B Supine hip fallouts GTB 10x B, 10/10 unilaterally Manual Therapy: Piriformis release R 2 min                                                                                                                       DATE: 07/28/23 Eval and HEP    PATIENT EDUCATION:  Education details: Discussed eval findings, rehab rationale and POC and patient is in agreement  Person educated: Patient Education method: Explanation Education comprehension: verbalized understanding and needs further education  HOME EXERCISE PROGRAM: Access Code: [email or web address] URL: [email or web address] Date: 08/13/2023 Prepared by: Gustavus Bryant  Exercises - Hip Flexor Stretch at [facility] of Bed  - 2 x daily - 5 x weekly - 1 sets - 2 reps - 30s hold - Static Prone on Elbows  - 2 x daily - 5 x weekly - 1 sets - 1 reps - 2 min hold -  Clamshell  - 2 x daily - 5 x weekly - 1 sets - 15 reps - Supine Piriformis Stretch with Foot on Ground  - 2 x daily - 5 x weekly - 1 sets - 2 reps - 30s hold - Hooklying Single Leg Bent Knee Fallouts with Resistance  - 2 x daily - 5 x weekly - 1 sets - 15 reps  ASSESSMENT:  CLINICAL IMPRESSION: Minimal symptoms reported.  Re-assessment show good progress towards goals. Outstanding issues  consist of R quad tightness and myofascial restrictions.  5x STS time shows functional LE strength.  HEP reviewed and updated, patient to f/u in 2-3 weeks to determine need to continue PT  EVAL: Patient is a 82 y.o. female who was seen today for physical therapy evaluation and treatment for ongoing R lumbosacral discomfort and R anterior thigh paresthesias ongoing since August.  Symptoms relieved with position changes.  5x STS time [facility], BLE flexibility is good throughout.  Lumbar ROM restricted in extension, mild R piriformis discomfort noted.  R hip flexor tightness and core weakness noted.  OBJECTIVE IMPAIRMENTS: Abnormal gait, decreased activity tolerance, decreased endurance, decreased knowledge of condition, decreased mobility, decreased ROM, decreased strength, increased fascial restrictions, impaired flexibility, improper body mechanics, postural dysfunction, and pain.   ACTIVITY LIMITATIONS: carrying, lifting, bending, sitting, and standing  PARTICIPATION LIMITATIONS: driving and golf  PERSONAL FACTORS: Age, Fitness, Past/current experiences, and Time since onset of injury/illness/exacerbation are also affecting patient's functional outcome.   REHAB POTENTIAL: Good  CLINICAL DECISION MAKING: Stable/uncomplicated  EVALUATION COMPLEXITY: Low   GOALS: Goals reviewed with patient? No  SHORT TERM GOALS: Target date: 08/18/2023    Patient to demonstrate independence in HEP Baseline: [id number] Goal status: Met  2.  Increase lumbar extension to 75% Baseline:  AROM eval 09/27/23  Flexion 75% 75%  Extension 50% 75%  Right lateral flexion 75% 75%  Left lateral flexion 75% 75%   Goal status: Met    LONG TERM GOALS: Target date: 09/08/2023    Increase FOTO score to 71 Baseline: 67; 09/27/23 84% Goal status: Met  2.  Increase BLE strength to 4/5 Baseline:  MMT Right eval Left eval  Hip flexion 4- 4-  Hip extension 4- 4-  Hip abduction 4- 4-  Hip adduction    Hip  internal rotation    Hip external rotation    Knee flexion 4- 4-  Knee extension 4- 4-  Ankle dorsiflexion    Ankle plantarflexion 4- 4-   Goal status: INITIAL  3.  Decrease pain levels to 1/10 Baseline: 2-3 level of discomfort; 09/27/23 0/10 at times Goal status: Met  4.  Resolve R piriformis tenderness Baseline: Moderate discomfort Goal status: Ongoing    PLAN:  PT FREQUENCY: 1-2x/week  PT DURATION: 6 weeks  PLANNED INTERVENTIONS: 97164- PT Re-evaluation, 97110-Therapeutic exercises, 97530- Therapeutic activity, 97112- Neuromuscular re-education, 97535- Self Care, 16109- Manual therapy, [id number]- Gait training, 97014- Electrical stimulation (unattended), Patient/Family education, Dry Needling, Joint mobilization, Spinal mobilization, Cryotherapy, and Moist heat.  PLAN FOR NEXT SESSION: HEP review and update, manual techniques as appropriate, aerobic tasks, ROM and flexibility activities, strengthening and PREs, TPDN, gait and balance training as needed     Hildred Laser, PT 09/27/2023, 2:43 PM

## 2023-09-27 ENCOUNTER — Ambulatory Visit: Payer: Medicare Other

## 2023-09-27 DIAGNOSIS — M5459 Other low back pain: Secondary | ICD-10-CM

## 2023-09-27 DIAGNOSIS — M6281 Muscle weakness (generalized): Secondary | ICD-10-CM

## 2023-09-27 DIAGNOSIS — R293 Abnormal posture: Secondary | ICD-10-CM

## 2023-10-14 DIAGNOSIS — L814 Other melanin hyperpigmentation: Secondary | ICD-10-CM | POA: Diagnosis not present

## 2023-10-14 DIAGNOSIS — L821 Other seborrheic keratosis: Secondary | ICD-10-CM | POA: Diagnosis not present

## 2023-10-14 DIAGNOSIS — L570 Actinic keratosis: Secondary | ICD-10-CM | POA: Diagnosis not present

## 2023-10-14 DIAGNOSIS — L820 Inflamed seborrheic keratosis: Secondary | ICD-10-CM | POA: Diagnosis not present

## 2023-10-14 DIAGNOSIS — L538 Other specified erythematous conditions: Secondary | ICD-10-CM | POA: Diagnosis not present

## 2023-10-14 DIAGNOSIS — L309 Dermatitis, unspecified: Secondary | ICD-10-CM | POA: Diagnosis not present

## 2023-10-14 DIAGNOSIS — D225 Melanocytic nevi of trunk: Secondary | ICD-10-CM | POA: Diagnosis not present

## 2023-10-17 ENCOUNTER — Other Ambulatory Visit: Payer: Self-pay | Admitting: Medical Genetics

## 2023-10-21 ENCOUNTER — Other Ambulatory Visit: Payer: Self-pay | Admitting: Nurse Practitioner

## 2023-10-21 DIAGNOSIS — E785 Hyperlipidemia, unspecified: Secondary | ICD-10-CM

## 2023-10-27 ENCOUNTER — Ambulatory Visit: Payer: Medicare Other

## 2023-11-02 ENCOUNTER — Telehealth: Payer: Self-pay

## 2023-11-02 NOTE — Telephone Encounter (Addendum)
Patient call this morning to ask if it is necessary to do lab work before or during the six months follow-up on February 3rd. Please Advise  Message sent to [name], NP

## 2023-11-02 NOTE — Telephone Encounter (Signed)
Left message on voicemail for patient to return call when available

## 2023-11-02 NOTE — Telephone Encounter (Signed)
Everything was stable last time, we can hold off on labs at this time.

## 2023-11-04 ENCOUNTER — Other Ambulatory Visit (HOSPITAL_COMMUNITY)
Admission: RE | Admit: 2023-11-04 | Discharge: 2023-11-04 | Disposition: A | Discharge status: Home / Self Care | Payer: Self-pay | Source: Ambulatory Visit | Attending: Oncology | Admitting: Oncology

## 2023-11-08 ENCOUNTER — Encounter: Payer: Self-pay | Admitting: Nurse Practitioner

## 2023-11-08 ENCOUNTER — Ambulatory Visit (INDEPENDENT_AMBULATORY_CARE_PROVIDER_SITE_OTHER): Payer: Medicare Other | Admitting: Nurse Practitioner

## 2023-11-08 VITALS — BP 100/60 | HR 70 | Temp 97.6°F | Ht 65.0 in

## 2023-11-08 DIAGNOSIS — K219 Gastro-esophageal reflux disease without esophagitis: Secondary | ICD-10-CM

## 2023-11-08 DIAGNOSIS — I10 Essential (primary) hypertension: Secondary | ICD-10-CM

## 2023-11-08 DIAGNOSIS — Z66 Do not resuscitate: Secondary | ICD-10-CM

## 2023-11-08 DIAGNOSIS — E785 Hyperlipidemia, unspecified: Secondary | ICD-10-CM

## 2023-11-08 DIAGNOSIS — F411 Generalized anxiety disorder: Secondary | ICD-10-CM | POA: Diagnosis not present

## 2023-11-08 DIAGNOSIS — M4712 Other spondylosis with myelopathy, cervical region: Secondary | ICD-10-CM

## 2023-11-08 DIAGNOSIS — E039 Hypothyroidism, unspecified: Secondary | ICD-10-CM

## 2023-11-08 DIAGNOSIS — F410 Panic disorder [episodic paroxysmal anxiety] without agoraphobia: Secondary | ICD-10-CM

## 2023-11-08 NOTE — Progress Notes (Signed)
Careteam: Patient Care Team: [name], NP as PCP - General (Geriatric Medicine) Blair Promise, OD (Optometry)  PLACE OF SERVICE:  [facility]  Advanced Directive information Does Patient Have a Medical Advance Directive?: Yes, Type of Advance Directive: Healthcare Power of [place];Living will;Out of facility DNR (pink MOST or yellow form), Pre-existing out of facility DNR order (yellow form or pink MOST form): Yellow form placed in chart (order not valid for inpatient use), Does patient want to make changes to medical advance directive?: No - Patient declined  Allergies  Allergen Reactions   Shellfish Allergy Diarrhea and Nausea And Vomiting    Chief Complaint  Patient presents with   Medical Management of Chronic Issues    6 month follow-up      HPI: Patient is a 83 y.o. female for routine follow up.   She reports she is doing great She has been doing weight watchers. Exercising She has lost 10 lbs through [employer] to the exercise classes at the club 3 times a week  Does not feel like she needs any more PT, she feels like her body is doing well She played golf last week and feels like her upper body is much stronger.   Review of Systems:  Review of Systems  Constitutional:  Negative for chills, fever and weight loss.  HENT:  Negative for tinnitus.   Respiratory:  Negative for cough, sputum production and shortness of breath.   Cardiovascular:  Negative for chest pain, palpitations and leg swelling.  Gastrointestinal:  Negative for abdominal pain, constipation, diarrhea and heartburn.  Genitourinary:  Negative for dysuria, frequency and urgency.  Musculoskeletal:  Negative for back pain, falls, joint pain and myalgias.  Skin: Negative.   Neurological:  Negative for dizziness and headaches.  Psychiatric/Behavioral:  Negative for depression and memory loss. The patient does not have insomnia.     Past Medical History:  Diagnosis Date   Acute  perichondritis of pinna    Allergic rhinitis due to pollen    Disorder of bone and cartilage, unspecified    Encounter for long-term (current) use of other medications    History of echocardiogram    Echo 9/18: EF 65-70   History of exercise stress test 06/2017   ETT 9/18: no ischemia; hypertensive BP response   HLD (hyperlipidemia)    Osteoarthritis    Panic disorder    Unspecified dermatitis due to sun    Unspecified hypothyroidism    Past Surgical History:  Procedure Laterality Date   BREAST BIOPSY Right 07/14/2016   BREAST BIOPSY Right 07/15/2016   cataract surgery Bilateral 10/12/2016   childbirth     x2 "blocks"   COLONOSCOPY     x2   TONSILLECTOMY  1949   removed   Social History:   reports that she has never smoked. She has never used smokeless tobacco. She reports current alcohol use of about 2.0 standard drinks of alcohol per week. She reports that she does not use drugs.  Family History  Problem Relation Age of Onset   Stroke Mother    Heart failure Mother 39   Hypertension Mother    Heart attack Father 66   Hyperlipidemia Sister    Atrial fibrillation Sister    Cancer Sister        Stomach Area   Thyroid disease Sister    Atrial fibrillation Brother    Breast cancer Maternal Grandmother 59   Thyroid disease Daughter    Fibromyalgia Daughter  Heart disease Son    Thyroid disease Son    Thyroid disease Son    Colon cancer Neg Hx     Medications: Patient's Medications  New Prescriptions   No medications on file  Previous Medications   ACETAMINOPHEN (TYLENOL) 650 MG CR TABLET    Take 1,300 mg by mouth in the morning and at bedtime.   ASPIRIN EC 81 MG TABLET    Take 81 mg by mouth daily. Swallow whole.   ATORVASTATIN (LIPITOR) 10 MG TABLET    TAKE 1 TABLET BY MOUTH DAILY FOR CHOLESTEROL   CALCIUM PO    Take 1,000 mg by mouth daily.    CHOLECALCIFEROL (VITAMIN D3) 125 MCG (5000 UT) CAPS    Take one capsule by mouth once daily.   CITALOPRAM (CELEXA)  20 MG TABLET    Take 1 tablet (20 mg total) by mouth daily.   DICLOFENAC SODIUM (VOLTAREN) 1 % GEL    Apply 4 g topically 4 (four) times daily as needed.   FAMOTIDINE (PEPCID) 10 MG TABLET    Take 1 tablet (10 mg total) by mouth 2 (two) times daily as needed for heartburn or indigestion.   FISH OIL-OMEGA-3 FATTY ACIDS 1000 MG CAPSULE    Take 1 g by mouth daily.    FLUCONAZOLE (DIFLUCAN) 150 MG TABLET    Take one tablet then repeat after 72 hours   FLUTICASONE (FLONASE) 50 MCG/ACT NASAL SPRAY    Place 1 spray into both nostrils daily.   KETOCONAZOLE (NIZORAL) 2 % CREAM    Apply 1 Application topically daily. As needed   LEVOTHYROXINE (SYNTHROID) 50 MCG TABLET    TAKE 1 TABLET BY MOUTH ONCE DAILY 30 MINUTES PRIOR TO BREAKFAST FOR THYROID   LOSARTAN (COZAAR) 50 MG TABLET    Take 1 tablet (50 mg total) by mouth daily.   MULTIPLE VITAMIN (MULTIVITAMIN) TABLET    Take 1 tablet by mouth daily.   NYSTATIN CREAM (MYCOSTATIN)    Apply 1 Application topically 2 (two) times daily as needed.   TRIAMCINOLONE CREAM (KENALOG) 0.1 %    Apply 1 application topically daily as needed (itching).   Modified Medications   No medications on file  Discontinued Medications   No medications on file    Physical Exam:  Vitals:   11/08/23 1341  BP: 100/60  Pulse: 70  Temp: 97.6 F (36.4 C)  SpO2: 98%  Height: 5\' 5"  (1.651 m)   Body mass index is 32.35 kg/m. Wt Readings from Last 3 Encounters:  05/10/23 194 lb 6.4 oz (88.2 kg)  03/19/23 195 lb 9.6 oz (88.7 kg)  11/02/22 196 lb (88.9 kg)    Physical Exam Constitutional:      General: She is not in acute distress.    Appearance: She is well-developed. She is not diaphoretic.  HENT:     Head: Normocephalic and atraumatic.     Mouth/Throat:     Pharynx: No oropharyngeal exudate.  Eyes:     Conjunctiva/sclera: Conjunctivae normal.     Pupils: Pupils are equal, round, and reactive to light.  Cardiovascular:     Rate and Rhythm: Normal rate and regular  rhythm.     Heart sounds: Normal heart sounds.  Pulmonary:     Effort: Pulmonary effort is normal.     Breath sounds: Normal breath sounds.  Abdominal:     General: Bowel sounds are normal.     Palpations: Abdomen is soft.  Musculoskeletal:     Cervical back:  Normal range of motion and neck supple.     Right lower leg: No edema.     Left lower leg: No edema.  Skin:    General: Skin is warm and dry.  Neurological:     Mental Status: She is alert.  Psychiatric:        Mood and Affect: Mood normal.     Labs reviewed: Basic Metabolic Panel: Recent Labs    03/19/23 1030 05/06/23 0802  NA 138 138  K 4.7 4.5  CL 104 105  CO2 26 25  GLUCOSE 86 94  BUN 25 22  CREATININE 0.90 0.90  CALCIUM 9.2 9.3  TSH  --  3.24   Liver Function Tests: Recent Labs    03/19/23 1030 05/06/23 0802  AST 16 20  ALT 14 15  BILITOT 0.7 0.6  PROT 6.4 6.3   No results for input(s): "LIPASE", "AMYLASE" in the last 8760 hours. No results for input(s): "AMMONIA" in the last 8760 hours. CBC: Recent Labs    05/06/23 0802  WBC 5.6  NEUTROABS 2,514  HGB 12.7  HCT 37.8  MCV 95.5  PLT 299   Lipid Panel: Recent Labs    05/06/23 0802  CHOL 154  HDL 62  LDLCALC 69  TRIG 146  CHOLHDL 2.5   TSH: Recent Labs    05/06/23 0802  TSH 3.24   A1C: Lab Results  Component Value Date   HGBA1C 5.2 03/20/2020     Assessment/Plan 1. Primary hypertension (Primary) -Blood pressure well controlled, goal bp <140/90 Continue current medications and dietary modifications follow metabolic panel - COMPLETE METABOLIC PANEL WITH GFR; Future - CBC with Differential/Platelet; Future  2. Gastroesophageal reflux disease without esophagitis -well controlled on current regimen   3. Hyperlipidemia with target LDL less than 100 Continues on Lipitor and ASA - Lipid panel; Future - COMPLETE METABOLIC PANEL WITH GFR; Future  4. Generalized anxiety disorder with panic attacks Well controlled on  celexa  5. Osteoarthritis of cervical spine with myelopathy Doing well at this time, continue exercising as tolerates and tylenol PRN  6. Hypothyroidism, unspecified type -well controlled on synthroid 50 mcg  - TSH; Future  7. DNR (do not resuscitate) - Do not attempt resuscitation (DNR)   Return in about 6 months (around 05/07/2024) for routine follow up, labs before visit .  Janene Harvey. Biagio Borg [facility] [phone]

## 2023-11-15 LAB — GENECONNECT MOLECULAR SCREEN: Genetic Analysis Overall Interpretation: NEGATIVE

## 2023-12-04 ENCOUNTER — Other Ambulatory Visit: Payer: Self-pay | Admitting: Nurse Practitioner

## 2023-12-04 DIAGNOSIS — E039 Hypothyroidism, unspecified: Secondary | ICD-10-CM

## 2023-12-15 ENCOUNTER — Other Ambulatory Visit: Payer: Self-pay | Admitting: Nurse Practitioner

## 2023-12-15 DIAGNOSIS — F410 Panic disorder [episodic paroxysmal anxiety] without agoraphobia: Secondary | ICD-10-CM

## 2024-01-24 ENCOUNTER — Encounter: Payer: Self-pay | Admitting: Nurse Practitioner

## 2024-01-28 DIAGNOSIS — M170 Bilateral primary osteoarthritis of knee: Secondary | ICD-10-CM | POA: Diagnosis not present

## 2024-02-23 DIAGNOSIS — K080 Exfoliation of teeth due to systemic causes: Secondary | ICD-10-CM | POA: Diagnosis not present

## 2024-03-24 DIAGNOSIS — M1712 Unilateral primary osteoarthritis, left knee: Secondary | ICD-10-CM | POA: Diagnosis not present

## 2024-05-04 ENCOUNTER — Other Ambulatory Visit: Payer: Self-pay | Admitting: Nurse Practitioner

## 2024-05-04 DIAGNOSIS — I10 Essential (primary) hypertension: Secondary | ICD-10-CM

## 2024-05-09 ENCOUNTER — Other Ambulatory Visit: Payer: Medicare Other

## 2024-05-10 ENCOUNTER — Other Ambulatory Visit: Payer: Self-pay | Admitting: Nurse Practitioner

## 2024-05-10 DIAGNOSIS — I10 Essential (primary) hypertension: Secondary | ICD-10-CM

## 2024-05-12 ENCOUNTER — Other Ambulatory Visit: Payer: Medicare Other

## 2024-05-12 DIAGNOSIS — I10 Essential (primary) hypertension: Secondary | ICD-10-CM

## 2024-05-12 DIAGNOSIS — E785 Hyperlipidemia, unspecified: Secondary | ICD-10-CM

## 2024-05-12 DIAGNOSIS — E039 Hypothyroidism, unspecified: Secondary | ICD-10-CM

## 2024-05-13 LAB — CBC WITH DIFFERENTIAL/PLATELET
Absolute Lymphocytes: 1903 {cells}/uL (ref 850–3900)
Absolute Monocytes: 680 {cells}/uL (ref 200–950)
Basophils Absolute: 57 {cells}/uL (ref 0–200)
Basophils Relative: 0.9 %
Eosinophils Absolute: 277 {cells}/uL (ref 15–500)
Eosinophils Relative: 4.4 %
HCT: 40.9 % (ref 35.0–45.0)
Hemoglobin: 13.5 g/dL (ref 11.7–15.5)
MCH: 32.6 pg (ref 27.0–33.0)
MCHC: 33 g/dL (ref 32.0–36.0)
MCV: 98.8 fL (ref 80.0–100.0)
MPV: 10 fL (ref 7.5–12.5)
Monocytes Relative: 10.8 %
Neutro Abs: 3383 {cells}/uL (ref 1500–7800)
Neutrophils Relative %: 53.7 %
Platelets: 287 Thousand/uL (ref 140–400)
RBC: 4.14 Million/uL (ref 3.80–5.10)
RDW: 12.8 % (ref 11.0–15.0)
Total Lymphocyte: 30.2 %
WBC: 6.3 Thousand/uL (ref 3.8–10.8)

## 2024-05-13 LAB — COMPREHENSIVE METABOLIC PANEL WITH GFR
AG Ratio: 1.8 (calc) (ref 1.0–2.5)
ALT: 14 U/L (ref 6–29)
AST: 17 U/L (ref 10–35)
Albumin: 4 g/dL (ref 3.6–5.1)
Alkaline phosphatase (APISO): 55 U/L (ref 37–153)
BUN/Creatinine Ratio: 20 (calc) (ref 6–22)
BUN: 20 mg/dL (ref 7–25)
CO2: 29 mmol/L (ref 20–32)
Calcium: 9 mg/dL (ref 8.6–10.4)
Chloride: 99 mmol/L (ref 98–110)
Creat: 1.02 mg/dL — ABNORMAL HIGH (ref 0.60–0.95)
Globulin: 2.2 g/dL (ref 1.9–3.7)
Glucose, Bld: 87 mg/dL (ref 65–99)
Potassium: 4.6 mmol/L (ref 3.5–5.3)
Sodium: 135 mmol/L (ref 135–146)
Total Bilirubin: 0.8 mg/dL (ref 0.2–1.2)
Total Protein: 6.2 g/dL (ref 6.1–8.1)
eGFR: 55 mL/min/1.73m2 — ABNORMAL LOW (ref >=60)

## 2024-05-13 LAB — LIPID PANEL
Cholesterol: 173 mg/dL (ref <200)
HDL: 75 mg/dL (ref >=50)
LDL Cholesterol (Calc): 79 mg/dL
Non-HDL Cholesterol (Calc): 98 mg/dL (ref <130)
Total CHOL/HDL Ratio: 2.3 (calc) (ref <5.0)
Triglycerides: 105 mg/dL (ref <150)

## 2024-05-13 LAB — TSH: TSH: 2.36 m[IU]/L (ref 0.40–4.50)

## 2024-05-15 ENCOUNTER — Ambulatory Visit (INDEPENDENT_AMBULATORY_CARE_PROVIDER_SITE_OTHER): Payer: Medicare Other | Admitting: Nurse Practitioner

## 2024-05-15 ENCOUNTER — Encounter: Payer: Self-pay | Admitting: Nurse Practitioner

## 2024-05-15 VITALS — BP 130/72 | HR 62 | Resp 10 | Ht 65.0 in | Wt 186.2 lb

## 2024-05-15 DIAGNOSIS — K219 Gastro-esophageal reflux disease without esophagitis: Secondary | ICD-10-CM | POA: Insufficient documentation

## 2024-05-15 DIAGNOSIS — K080 Exfoliation of teeth due to systemic causes: Secondary | ICD-10-CM | POA: Diagnosis not present

## 2024-05-15 DIAGNOSIS — E039 Hypothyroidism, unspecified: Secondary | ICD-10-CM | POA: Diagnosis not present

## 2024-05-15 DIAGNOSIS — I10 Essential (primary) hypertension: Secondary | ICD-10-CM | POA: Insufficient documentation

## 2024-05-15 DIAGNOSIS — M170 Bilateral primary osteoarthritis of knee: Secondary | ICD-10-CM | POA: Diagnosis not present

## 2024-05-15 DIAGNOSIS — E559 Vitamin D deficiency, unspecified: Secondary | ICD-10-CM

## 2024-05-15 DIAGNOSIS — E785 Hyperlipidemia, unspecified: Secondary | ICD-10-CM

## 2024-05-15 DIAGNOSIS — F411 Generalized anxiety disorder: Secondary | ICD-10-CM

## 2024-05-15 NOTE — Progress Notes (Signed)
 Careteam: Patient Care Team: Caro Harlene POUR, NP as PCP - General (Geriatric Medicine) Portia Fireman, OD (Optometry) Gaspar Kung, MD as Referring Physician (Orthopedic Surgery)  PLACE OF SERVICE:  [facility]  Advanced Directive information    Allergies  Allergen Reactions   Shellfish Allergy Diarrhea and Nausea And Vomiting    Chief Complaint  Patient presents with   Medical Management of Chronic Issues    Routine follow up // has more indigestion than usual wants to discuss labs      HPI:  Discussed the use of AI scribe software for clinical note transcription with the patient, who gave verbal consent to proceed.  History of Present Illness Patricia Cantu is an 83 year old female who presents for follow-up visit.  She has experienced a slight decrease in kidney function. She is not using NSAIDs. Her thyroid  function, blood counts, liver function, electrolytes, and cholesterol levels are normal.  She has a history of panic attacks, with the last significant episode occurring ten years ago after her brother-in-law's sudden death. She is currently taking Celexa  20 mg daily for mood and is doing well with it.  She experiences occasional indigestion, which she associates with tomato products and possibly alcohol. She uses famotidine  as needed to manage these symptoms.  She has a history of rashes under her breasts, for which she uses ketoconazole cream, niacinamide cream, and triamcinolone  cream.   She was active during a recent cruise, walking extensively and maintaining her weight. She experienced occasional morning nausea during her trip, which resolved after getting up and about.   Review of Systems:  Review of Systems  Constitutional:  Negative for chills, fever and weight loss.  HENT:  Negative for tinnitus.   Respiratory:  Negative for cough, sputum production and shortness of breath.   Cardiovascular:  Negative for chest pain, palpitations and leg  swelling.  Gastrointestinal:  Negative for abdominal pain, constipation, diarrhea and heartburn.  Genitourinary:  Negative for dysuria, frequency and urgency.  Musculoskeletal:  Negative for back pain, falls, joint pain and myalgias.  Skin: Negative.   Neurological:  Negative for dizziness and headaches.  Psychiatric/Behavioral:  Negative for depression and memory loss. The patient does not have insomnia.     Past Medical History:  Diagnosis Date   Acute perichondritis of pinna    Allergic rhinitis due to pollen    Disorder of bone and cartilage, unspecified    Encounter for long-term (current) use of other medications    History of echocardiogram    Echo 9/18: EF 65-70   History of exercise stress test 06/2017   ETT 9/18: no ischemia; hypertensive BP response   HLD (hyperlipidemia)    Osteoarthritis    Panic disorder    Unspecified dermatitis due to sun    Unspecified hypothyroidism    Past Surgical History:  Procedure Laterality Date   BREAST BIOPSY Right 07/14/2016   BREAST BIOPSY Right 07/15/2016   cataract surgery Bilateral 10/12/2016   childbirth     x2 blocks   COLONOSCOPY     x2   TONSILLECTOMY  1949   removed   Social History:   reports that she has never smoked. She has never used smokeless tobacco. She reports current alcohol use of about 2.0 standard drinks of alcohol per week. She reports that she does not use drugs.  Family History  Problem Relation Age of Onset   Stroke Mother    Heart failure Mother 70   Hypertension Mother  Heart attack Father 68   Hyperlipidemia Sister    Atrial fibrillation Sister    Cancer Sister        Stomach Area   Thyroid  disease Sister    Atrial fibrillation Brother    Breast cancer Maternal Grandmother 2   Thyroid  disease Daughter    Fibromyalgia Daughter    Heart disease Son    Thyroid  disease Son    Thyroid  disease Son    Colon cancer Neg Hx     Medications: Patient's Medications  New Prescriptions   No  medications on file  Previous Medications   ACETAMINOPHEN (TYLENOL) 650 MG CR TABLET    Take 1,300 mg by mouth in the morning and at bedtime.   ASPIRIN EC 81 MG TABLET    Take 81 mg by mouth daily. Swallow whole.   ATORVASTATIN  (LIPITOR) 10 MG TABLET    TAKE 1 TABLET BY MOUTH DAILY FOR CHOLESTEROL   CALCIUM  PO    Take 1,000 mg by mouth daily.    CHOLECALCIFEROL (VITAMIN D3) 125 MCG (5000 UT) CAPS    Take one capsule by mouth once daily.   CITALOPRAM  (CELEXA ) 20 MG TABLET    TAKE 1 TABLET (20 MG TOTAL) BY MOUTH DAILY.   DICLOFENAC  SODIUM (VOLTAREN ) 1 % GEL    Apply 4 g topically 4 (four) times daily as needed.   FAMOTIDINE  (PEPCID ) 10 MG TABLET    Take 1 tablet (10 mg total) by mouth 2 (two) times daily as needed for heartburn or indigestion.   FISH OIL-OMEGA-3 FATTY ACIDS 1000 MG CAPSULE    Take 1 g by mouth daily.    FLUTICASONE  (FLONASE ) 50 MCG/ACT NASAL SPRAY    Place 1 spray into both nostrils daily.   KETOCONAZOLE (NIZORAL) 2 % CREAM    Apply 1 Application topically daily. As needed   LEVOTHYROXINE  (SYNTHROID ) 50 MCG TABLET    TAKE 1 TABLET BY MOUTH ONCE DAILY 30 MINUTES PRIOR TO BREAKFAST FOR THYROID    LOSARTAN  (COZAAR ) 50 MG TABLET    TAKE 1 TABLET (50 MG TOTAL) BY MOUTH DAILY.   MULTIPLE VITAMIN (MULTIVITAMIN) TABLET    Take 1 tablet by mouth daily.   NYSTATIN  CREAM (MYCOSTATIN )    Apply 1 Application topically 2 (two) times daily as needed.   TRIAMCINOLONE  CREAM (KENALOG ) 0.1 %    Apply 1 application topically daily as needed (itching).   Modified Medications   No medications on file  Discontinued Medications   FLUCONAZOLE  (DIFLUCAN ) 150 MG TABLET    Take one tablet then repeat after 72 hours    Physical Exam:  Vitals:   05/15/24 1420  BP: 130/72  Pulse: 62  Resp: 10  SpO2: 95%  Weight: 186 lb 3.2 oz (84.5 kg)  Height: 5' 5 (1.651 m)   Body mass index is 30.99 kg/m. Wt Readings from Last 3 Encounters:  05/15/24 186 lb 3.2 oz (84.5 kg)  05/10/23 194 lb 6.4 oz (88.2 kg)   03/19/23 195 lb 9.6 oz (88.7 kg)    Physical Exam Constitutional:      General: She is not in acute distress.    Appearance: She is well-developed. She is not diaphoretic.  HENT:     Head: Normocephalic and atraumatic.     Mouth/Throat:     Pharynx: No oropharyngeal exudate.  Eyes:     Conjunctiva/sclera: Conjunctivae normal.     Pupils: Pupils are equal, round, and reactive to light.  Cardiovascular:     Rate and Rhythm:  Normal rate and regular rhythm.     Heart sounds: Normal heart sounds.  Pulmonary:     Effort: Pulmonary effort is normal.     Breath sounds: Normal breath sounds.  Abdominal:     General: Bowel sounds are normal.     Palpations: Abdomen is soft.  Musculoskeletal:     Cervical back: Normal range of motion and neck supple.     Right lower leg: No edema.     Left lower leg: No edema.  Skin:    General: Skin is warm and dry.  Neurological:     Mental Status: She is alert.  Psychiatric:        Mood and Affect: Mood normal.     Labs reviewed: Basic Metabolic Panel: Recent Labs    05/12/24 0826  NA 135  K 4.6  CL 99  CO2 29  GLUCOSE 87  BUN 20  CREATININE 1.02*  CALCIUM  9.0  TSH 2.36   Liver Function Tests: Recent Labs    05/12/24 0826  AST 17  ALT 14  BILITOT 0.8  PROT 6.2   No results for input(s): LIPASE, AMYLASE in the last 8760 hours. No results for input(s): AMMONIA in the last 8760 hours. CBC: Recent Labs    05/12/24 0826  WBC 6.3  NEUTROABS 3,383  HGB 13.5  HCT 40.9  MCV 98.8  PLT 287   Lipid Panel: Recent Labs    05/12/24 0826  CHOL 173  HDL 75  LDLCALC 79  TRIG 105  CHOLHDL 2.3   TSH: Recent Labs    05/12/24 0826  TSH 2.36   A1C: Lab Results  Component Value Date   HGBA1C 5.2 03/20/2020     Assessment/Plan Primary hypertension Assessment & Plan: Blood pressure well controlled, goal bp <140/90 Continue current medications and dietary modifications follow metabolic panel  Orders: -      Basic metabolic panel with GFR; Future -     CBC with Differential/Platelet; Future -     Comprehensive metabolic panel with GFR; Future  Gastroesophageal reflux disease without esophagitis Assessment & Plan: Continue dietary modifications, uses pepcid  PRN   Hyperlipidemia with target LDL less than 100 Assessment & Plan: At goal, continues diet and exercise.  Continues on lipitor.   Orders: -     Lipid panel; Future  Hypothyroidism, unspecified type  TSH at goal, continue synthroid    Primary osteoarthritis of both knees Assessment & Plan: Stable at this time, uses tylenol PRN   GAD (generalized anxiety disorder)  Stable on celexa   Vitamin D  deficiency Assessment & Plan: Continues on vit d supplement.   Orders: -     VITAMIN D  25 Hydroxy (Vit-D Deficiency, Fractures); Future   Return in about 1 year (around 05/15/2025) for routine follow up, labs prior to visit.  Azarion Hove K. Caro BODILY [facility] [phone]

## 2024-05-15 NOTE — Assessment & Plan Note (Signed)
 Stable at this time, uses tylenol PRN

## 2024-05-15 NOTE — Assessment & Plan Note (Signed)
 Continues on vit d supplement

## 2024-05-15 NOTE — Assessment & Plan Note (Signed)
 At goal, continues diet and exercise.  Continues on lipitor.

## 2024-05-15 NOTE — Assessment & Plan Note (Signed)
 Blood pressure well controlled, goal bp <140/90 Continue current medications and dietary modifications follow metabolic panel

## 2024-05-15 NOTE — Assessment & Plan Note (Signed)
 Continue dietary modifications, uses pepcid  PRN

## 2024-05-29 ENCOUNTER — Other Ambulatory Visit

## 2024-05-29 DIAGNOSIS — I10 Essential (primary) hypertension: Secondary | ICD-10-CM | POA: Diagnosis not present

## 2024-05-29 DIAGNOSIS — E559 Vitamin D deficiency, unspecified: Secondary | ICD-10-CM

## 2024-05-29 DIAGNOSIS — E785 Hyperlipidemia, unspecified: Secondary | ICD-10-CM | POA: Diagnosis not present

## 2024-05-30 ENCOUNTER — Ambulatory Visit: Payer: Self-pay | Admitting: Nurse Practitioner

## 2024-05-30 ENCOUNTER — Encounter: Payer: Self-pay | Admitting: Nurse Practitioner

## 2024-05-30 LAB — CBC WITH DIFFERENTIAL/PLATELET
Absolute Lymphocytes: 1782 {cells}/uL (ref 850–3900)
Absolute Monocytes: 616 {cells}/uL (ref 200–950)
Basophils Absolute: 67 {cells}/uL (ref 0–200)
Basophils Relative: 1 %
Eosinophils Absolute: 188 {cells}/uL (ref 15–500)
Eosinophils Relative: 2.8 %
HCT: 40.1 % (ref 35.0–45.0)
Hemoglobin: 13.3 g/dL (ref 11.7–15.5)
MCH: 32.5 pg (ref 27.0–33.0)
MCHC: 33.2 g/dL (ref 32.0–36.0)
MCV: 98 fL (ref 80.0–100.0)
MPV: 10 fL (ref 7.5–12.5)
Monocytes Relative: 9.2 %
Neutro Abs: 4047 {cells}/uL (ref 1500–7800)
Neutrophils Relative %: 60.4 %
Platelets: 279 Thousand/uL (ref 140–400)
RBC: 4.09 Million/uL (ref 3.80–5.10)
RDW: 12.5 % (ref 11.0–15.0)
Total Lymphocyte: 26.6 %
WBC: 6.7 Thousand/uL (ref 3.8–10.8)

## 2024-05-30 LAB — LIPID PANEL
Cholesterol: 161 mg/dL (ref <200)
HDL: 66 mg/dL (ref >=50)
LDL Cholesterol (Calc): 74 mg/dL
Non-HDL Cholesterol (Calc): 95 mg/dL (ref <130)
Total CHOL/HDL Ratio: 2.4 (calc) (ref <5.0)
Triglycerides: 127 mg/dL (ref <150)

## 2024-05-30 LAB — BASIC METABOLIC PANEL WITH GFR
BUN/Creatinine Ratio: 22 (calc) (ref 6–22)
BUN: 22 mg/dL (ref 7–25)
CO2: 29 mmol/L (ref 20–32)
Calcium: 9 mg/dL (ref 8.6–10.4)
Chloride: 100 mmol/L (ref 98–110)
Creat: 1.01 mg/dL — ABNORMAL HIGH (ref 0.60–0.95)
Glucose, Bld: 91 mg/dL (ref 65–99)
Potassium: 4.7 mmol/L (ref 3.5–5.3)
Sodium: 135 mmol/L (ref 135–146)
eGFR: 56 mL/min/1.73m2 — ABNORMAL LOW (ref >=60)

## 2024-05-30 LAB — VITAMIN D 25 HYDROXY (VIT D DEFICIENCY, FRACTURES): Vit D, 25-Hydroxy: 65 ng/mL (ref 30–100)

## 2024-06-07 ENCOUNTER — Other Ambulatory Visit: Payer: Self-pay | Admitting: Nurse Practitioner

## 2024-06-07 DIAGNOSIS — E039 Hypothyroidism, unspecified: Secondary | ICD-10-CM

## 2024-06-08 DIAGNOSIS — H10012 Acute follicular conjunctivitis, left eye: Secondary | ICD-10-CM | POA: Diagnosis not present

## 2024-06-26 ENCOUNTER — Other Ambulatory Visit (HOSPITAL_BASED_OUTPATIENT_CLINIC_OR_DEPARTMENT_OTHER)

## 2024-07-19 DIAGNOSIS — H903 Sensorineural hearing loss, bilateral: Secondary | ICD-10-CM | POA: Diagnosis not present

## 2024-07-19 NOTE — Progress Notes (Signed)
 Otolaryngology Clinic Note  HPI:    Patricia Cantu is a 83 y.o. female who presents as a return patient.  Ms. Eskin returns today for follow-up of hearing loss.  She reports no acute problems.  She still notes that there were occasional circumstances where she may not feel like she hears as well as she would like.  There is no history of chronic ear problems such as infections or surgery.  No history of long-term loud noise exposure.  PMH/Meds/All/SocHx/FamHx/ROS:   Medical History[1]  Surgical History[2]  No family history of bleeding disorders, wound healing problems or difficulty with anesthesia.      Current Medications[3]  A complete ROS was performed with pertinent positives/negatives noted in the HPI. The remainder of the ROS are negative.    Physical Exam:    Temp 97.1 F (36.2 C) (Temporal)   Ht 1.651 m (5' 5)   Wt 84.6 kg (186 lb 6.4 oz)   BMI 31.02 kg/m   Constitutional:  Patient appears well-nourished and well-developed. No acute distress.   Head/Face: Facial features are symmetric. Skull is normocephalic. Hair and scalp are normal. Normal temporal artery pulses. TMJ shows no joint deformity swelling or erythema.   Eyes: Pupils are equal, round and reactive to light. Conjunctiva and lids are normal. Normal extraocular mobility. Normal vision by patient report.   Ears:     Right: Pinna and external meatus normal, normal ear canal skin and caliber without excessive cerumen or drainage. Tympanic membranes intact without effusion or infection.     Left: Pinna and external meatus normal, normal ear canal skin and caliber without excessive cerumen or drainage. Tympanic membranes intact without effusion or infection.    Nose/Sinus/Nasopharynx: Septum is normal. Normal nasal mucosa. Normal inferior turbinates.    Oral cavity/Oropharynx: Lips normal, teeth and gums normal with good dentition, normal oral vestibule. Normal floor of mouth, tongue and oral mucosa, no  mucosal lesions, ulcer or mass, normal tongue mobility.  Hard and soft palate normal with normal mobility. One plus tonsils, no erythema or exudate. Base of tongue, retromolar trigone and oral pharynx normal. Normal sensation, mobility and gag.   Neck: No cervical lymphadenopathy, mass or swelling. Salivary glands normal to palpation without swelling, erythema or mass. Normal facial nerve function. Normal thyroid  gland palpation.   Neurological: Alert and oriented to self, place and time.  Normal reflexes and motor skills, balance and coordination.   Psychiatric: No unusual anxiety or evidence of depression. Appropriate affect.     Independent Review of Additional Tests or Records:  Audiological evaluation: Audiometry: Right ear shows normal thresholds from 250 Hz through 1000 Hz then downsloping from mild to moderate loss at 8000 Hz.  The left ear shows mild loss from 2 and 50 Hz through 8000 Hz with the exception of normal thresholds at 1000 Hz and 3000 Hz. SRT's: 20 dB HL in both ears. Word recognition scores: Right ear shows 84%.  The left ear shows 92%.  Both with presentation levels of 60 dB HL with 40 dB EM.  Procedures:  None   Impression & Plans:   1) sensorineural hearing loss-bilateral   Hearing protection discussed Patient is medically cleared for hearing aids Hearing aid evaluation if desired Return to clinic 1 year audio first or as needed if any changes noted  Alm RAMAN. Spainhour, PA-C [facility]        [1] No past medical history on file. [2] No past surgical history on file. [3]  Current Outpatient  Medications:  .  aspirin 81 mg EC tablet, Take 81 mg by mouth daily., Disp: , Rfl:  .  atorvastatin  (LIPITOR) 10 mg tablet, Take 10 mg by mouth daily. for cholesterol, Disp: , Rfl:  .  cholecalciferol (VITAMIN D3) 2,000 unit cap capsule, Take 1 capsule by mouth., Disp: , Rfl:  .  citalopram  (CeleXA ) 20 mg tablet, Take 20 mg by mouth daily., Disp: ,  Rfl:  .  DOCOSAHEXAENOIC ACID ORAL, Take by mouth., Disp: , Rfl:  .  fluticasone  propionate (FLONASE ) 50 mcg/spray nasal spray, Administer 1 spray into affected nostril(s)., Disp: , Rfl:  .  ketoconazole (NIZORAL) 2 % cream, APPLY TWICE A DAY TO AFFECTED AREAS FOR 3 WEEKS AS NEEDED FOR FLARE, Disp: , Rfl:  .  levothyroxine  (SYNTHROID ) 50 mcg tablet, Take 50 mcg by mouth every morning., Disp: , Rfl:  .  losartan  (COZAAR ) 50 mg tablet, Take 50 mg by mouth daily., Disp: , Rfl:  .  multivitamin (THERAGRAN) tab tablet, Take 1 tablet by mouth daily., Disp: , Rfl:  .  nystatin  (MYCOSTATIN ) 100,000 unit/gram cream, Apply 1 Application topically., Disp: , Rfl:  .  triamcinolone  acetonide (KENALOG ) 0.1 % cream, APPLY TWICE A DAY TO RASH FOR 2 WEEKS ON, 2 WEEKS OFF. REPEAT AS NEEDED. ADD ON TOP OF KETOCONAZOLE CREAM., Disp: , Rfl:

## 2024-07-20 DIAGNOSIS — Z9841 Cataract extraction status, right eye: Secondary | ICD-10-CM | POA: Diagnosis not present

## 2024-07-20 DIAGNOSIS — H5211 Myopia, right eye: Secondary | ICD-10-CM | POA: Diagnosis not present

## 2024-07-20 DIAGNOSIS — Z9842 Cataract extraction status, left eye: Secondary | ICD-10-CM | POA: Diagnosis not present

## 2024-07-30 ENCOUNTER — Emergency Department (HOSPITAL_COMMUNITY)

## 2024-07-30 ENCOUNTER — Emergency Department (HOSPITAL_COMMUNITY)
Admission: EM | Admit: 2024-07-30 | Discharge: 2024-07-30 | Disposition: A | Discharge status: Home / Self Care | Attending: Emergency Medicine | Admitting: Emergency Medicine

## 2024-07-30 ENCOUNTER — Other Ambulatory Visit: Payer: Self-pay

## 2024-07-30 ENCOUNTER — Encounter (HOSPITAL_COMMUNITY): Payer: Self-pay | Admitting: Emergency Medicine

## 2024-07-30 DIAGNOSIS — Z7982 Long term (current) use of aspirin: Secondary | ICD-10-CM | POA: Insufficient documentation

## 2024-07-30 DIAGNOSIS — I700 Atherosclerosis of aorta: Secondary | ICD-10-CM | POA: Diagnosis not present

## 2024-07-30 DIAGNOSIS — I10 Essential (primary) hypertension: Secondary | ICD-10-CM | POA: Insufficient documentation

## 2024-07-30 DIAGNOSIS — R519 Headache, unspecified: Secondary | ICD-10-CM | POA: Diagnosis not present

## 2024-07-30 DIAGNOSIS — Z79899 Other long term (current) drug therapy: Secondary | ICD-10-CM | POA: Insufficient documentation

## 2024-07-30 DIAGNOSIS — R448 Other symptoms and signs involving general sensations and perceptions: Secondary | ICD-10-CM

## 2024-07-30 DIAGNOSIS — I672 Cerebral atherosclerosis: Secondary | ICD-10-CM | POA: Diagnosis not present

## 2024-07-30 DIAGNOSIS — I6523 Occlusion and stenosis of bilateral carotid arteries: Secondary | ICD-10-CM | POA: Diagnosis not present

## 2024-07-30 DIAGNOSIS — R030 Elevated blood-pressure reading, without diagnosis of hypertension: Secondary | ICD-10-CM | POA: Diagnosis not present

## 2024-07-30 LAB — BASIC METABOLIC PANEL WITH GFR
Anion gap: 9 (ref 5–15)
BUN: 22 mg/dL (ref 8–23)
CO2: 25 mmol/L (ref 22–32)
Calcium: 8.8 mg/dL — ABNORMAL LOW (ref 8.9–10.3)
Chloride: 103 mmol/L (ref 98–111)
Creatinine, Ser: 1.02 mg/dL — ABNORMAL HIGH (ref 0.44–1.00)
GFR, Estimated: 55 mL/min — ABNORMAL LOW (ref >60)
Glucose, Bld: 95 mg/dL (ref 70–99)
Potassium: 4.2 mmol/L (ref 3.5–5.1)
Sodium: 137 mmol/L (ref 135–145)

## 2024-07-30 LAB — CBC WITH DIFFERENTIAL/PLATELET
Abs Immature Granulocytes: 0.02 K/uL (ref 0.00–0.07)
Basophils Absolute: 0.1 K/uL (ref 0.0–0.1)
Basophils Relative: 1 %
Eosinophils Absolute: 0.1 K/uL (ref 0.0–0.5)
Eosinophils Relative: 1 %
HCT: 40.6 % (ref 36.0–46.0)
Hemoglobin: 13.5 g/dL (ref 12.0–15.0)
Immature Granulocytes: 0 %
Lymphocytes Relative: 21 %
Lymphs Abs: 1.9 K/uL (ref 0.7–4.0)
MCH: 32.4 pg (ref 26.0–34.0)
MCHC: 33.3 g/dL (ref 30.0–36.0)
MCV: 97.4 fL (ref 80.0–100.0)
Monocytes Absolute: 0.8 K/uL (ref 0.1–1.0)
Monocytes Relative: 9 %
Neutro Abs: 5.8 K/uL (ref 1.7–7.7)
Neutrophils Relative %: 68 %
Platelets: 268 K/uL (ref 150–400)
RBC: 4.17 MIL/uL (ref 3.87–5.11)
RDW: 13.7 % (ref 11.5–15.5)
WBC: 8.6 K/uL (ref 4.0–10.5)
nRBC: 0 % (ref 0.0–0.2)

## 2024-07-30 MED ORDER — IOHEXOL 350 MG/ML SOLN
75.0000 mL | Freq: Once | INTRAVENOUS | Status: AC | PRN
  Administered 2024-07-30: 75 mL via INTRAVENOUS

## 2024-07-30 MED ORDER — HYDRALAZINE HCL 20 MG/ML IJ SOLN
5.0000 mg | Freq: Once | INTRAMUSCULAR | Status: AC
  Administered 2024-07-30: 5 mg via INTRAVENOUS
  Filled 2024-07-30: qty 1

## 2024-07-30 NOTE — ED Triage Notes (Signed)
 Per [facility] pt coming from church- reports about 2 hours ago started experiencing pressure to right side of her head. Hx of HTN. Initial BP 180/90. Denies any chest pain, shortness of breath or numbness. States pressure in head has relieved mostly.

## 2024-07-30 NOTE — Discharge Instructions (Addendum)
 You were seen today for left-sided facial pressure that lasted about 30 minutes today.  Your imaging today did show some mild to moderate P2 stenosis as well as some mild atherosclerotic findings in carotids.  However exam and imaging otherwise very reassuring.  Would still have you follow-up with your PCP for further evaluation and possible referral to neurology.  Recommend continuing your aspirin therapy as well as monitoring symptoms.  If you been having new or worsening symptoms which include unilateral weakness, slurring of words, confusion, uncontrollable headache or vision changes, please return to the ED sooner for immediate evaluation.

## 2024-07-30 NOTE — ED Provider Notes (Signed)
 [place] EMERGENCY DEPARTMENT AT [facility] Provider Note   CSN: [id number] Arrival date & time: 07/30/24  1229     Patient presents with: Hypertension   Patricia Cantu is a 83 y.o. female patient with history of hyperlipidemia who presents to the emergency department today for further evaluation of left-sided facial pressure that occurred while at church around 11 AM.  She states that she was sitting there when she started having this pressure on the left side of her face went down to the neck.  Did not go into the extremities.  This lasted approximately about 30 minutes and is currently improving.  Primarily localized to the left side of the neck now.  She denies any focal weakness or numbness.  Was found to also have elevated blood pressure.  She does take antihypertensive medication and has not missed any doses and her blood pressure typically runs in the 120s systolic.  She denies any chest pain or shortness of breath.  Patient not anticoagulated.   Hypertension       Prior to Admission medications   Medication Sig Start Date End Date Taking? Authorizing Provider  acetaminophen (TYLENOL) 650 MG CR tablet Take 1,300 mg by mouth in the morning and at bedtime.    [provider]  aspirin EC 81 MG tablet Take 81 mg by mouth daily. Swallow whole.    [provider]  atorvastatin  (LIPITOR) 10 MG tablet TAKE 1 TABLET BY MOUTH DAILY FOR CHOLESTEROL 06/07/24   Eubanks, Jessica K, NP  CALCIUM  PO Take 1,000 mg by mouth daily.     [provider]  Cholecalciferol (VITAMIN D ) 50 MCG (2000 UT) CAPS Take 1 capsule by mouth 3 (three) times a week.    [provider]  Cholecalciferol (VITAMIN D3) 125 MCG (5000 UT) CAPS Take one capsule by mouth once daily.    [provider]  citalopram  (CELEXA ) 20 MG tablet TAKE 1 TABLET (20 MG TOTAL) BY MOUTH DAILY. 12/16/23   Eubanks, Jessica K, NP  diclofenac  Sodium (VOLTAREN ) 1 % GEL Apply 4 g topically 4  (four) times daily as needed.    [provider]  famotidine  (PEPCID ) 10 MG tablet Take 1 tablet (10 mg total) by mouth 2 (two) times daily as needed for heartburn or indigestion. 11/02/22   Caro Harlene POUR, NP  fish oil-omega-3 fatty acids 1000 MG capsule Take 1 g by mouth daily.     [provider]  fluticasone  (FLONASE ) 50 MCG/ACT nasal spray Place 1 spray into both nostrils daily. 03/19/23   Eubanks, Jessica K, NP  ketoconazole (NIZORAL) 2 % cream Apply 1 Application topically daily. As needed    [provider]  levothyroxine  (SYNTHROID ) 50 MCG tablet TAKE 1 TABLET BY MOUTH ONCE DAILY 30 MINUTES PRIOR TO BREAKFAST FOR THYROID  10/21/23   Caro Harlene POUR, NP  losartan  (COZAAR ) 50 MG tablet TAKE 1 TABLET (50 MG TOTAL) BY MOUTH DAILY. 05/10/24   Eubanks, Jessica K, NP  Multiple Vitamin (MULTIVITAMIN) tablet Take 1 tablet by mouth daily.    [provider]  nystatin  cream (MYCOSTATIN ) Apply 1 Application topically 2 (two) times daily as needed. 05/10/23   Eubanks, Jessica K, NP  triamcinolone  cream (KENALOG ) 0.1 % Apply 1 application topically daily as needed (itching).  03/24/16   [provider]    Allergies: Shellfish allergy    Review of Systems  All other systems reviewed and are negative.   Updated Vital Signs BP (!) 186/66  Pulse 64   Temp (!) 97.3 F (36.3 C) (Oral)   Resp (!) 22   Ht 5' 4.5 (1.638 m)   Wt 81.6 kg   SpO2 100%   BMI 30.42 kg/m   Physical Exam Vitals and nursing note reviewed.  Constitutional:      General: She is not in acute distress.    Appearance: Normal appearance.  HENT:     Head: Normocephalic and atraumatic.  Eyes:     General:        Right eye: No discharge.        Left eye: No discharge.  Cardiovascular:     Comments: Regular rate and rhythm.  S1/S2 are distinct without any evidence of murmur, rubs, or gallops.  Radial pulses are 2+ bilaterally.  Dorsalis pedis pulses are 2+ bilaterally.  No  evidence of pedal edema. Pulmonary:     Comments: Clear to auscultation bilaterally.  Normal effort.  No respiratory distress.  No evidence of wheezes, rales, or rhonchi heard throughout. Abdominal:     General: Abdomen is flat. Bowel sounds are normal. There is no distension.     Tenderness: There is no abdominal tenderness. There is no guarding or rebound.  Musculoskeletal:        General: Normal range of motion.     Cervical back: Neck supple.  Skin:    General: Skin is warm and dry.     Findings: No rash.  Neurological:     General: No focal deficit present.     Mental Status: She is alert.     Comments: Cranial nerves II through XII are intact.  5/5 strength to the upper and lower extremities.  Normal sensation to the upper and lower extremities.  Extract movements are intact without any evidence of entrapment.  Alert and oriented x 3.  Psychiatric:        Mood and Affect: Mood normal.        Behavior: Behavior normal.     (all labs ordered are listed, but only abnormal results are displayed) Labs Reviewed  BASIC METABOLIC PANEL WITH GFR - Abnormal; Notable for the following components:      Result Value   Creatinine, Ser 1.02 (*)    Calcium  8.8 (*)    GFR, Estimated 55 (*)    All other components within normal limits  CBC WITH DIFFERENTIAL/PLATELET    EKG: EKG Interpretation Date/Time:  Sunday July 30 2024 13:10:15 EDT Ventricular Rate:  61 PR Interval:  194 QRS Duration:  66 QT Interval:  416 QTC Calculation: 418 R Axis:   24  Text Interpretation: Normal sinus rhythm Low voltage QRS Nonspecific ST abnormality Abnormal ECG When compared with ECG of 02-Apr-2017 15:43, PREVIOUS ECG IS PRESENT Confirmed by Patricia Coy [phone]) on 07/30/2024 1:25:27 PM  Radiology: CT Head Wo Contrast Result Date: 07/30/2024 EXAM: CT HEAD WITHOUT CONTRAST 07/30/2024 01:52:19 PM TECHNIQUE: CT of the head was performed without the administration of intravenous contrast. Automated  exposure control, iterative reconstruction, and/or weight based adjustment of the mA/kV was utilized to reduce the radiation dose to as low as reasonably achievable. COMPARISON: None available. CLINICAL HISTORY: Headache, new onset (Age >= 51y). FINDINGS: BRAIN AND VENTRICLES: No acute hemorrhage. No evidence of acute infarct. No hydrocephalus. No extra-axial collection. No mass effect or midline shift. Periventricular white matter changes demonstrate some progression and are mildly advanced for age. Intracranial atherosclerosis. ORBITS: Bilateral lens replacements are noted. The globes and orbits are otherwise within normal limits. SINUSES:  No acute abnormality. SOFT TISSUES AND SKULL: No acute soft tissue abnormality. No skull fracture. IMPRESSION: 1. No acute intracranial abnormality. 2. Mildly advanced periventricular white matter changes for age, with interval progression. 3. Intracranial atherosclerosis. Electronically signed by: Lonni Necessary MD 07/30/2024 02:08 PM EDT RP Workstation: [id number]   DG Chest 2 View Result Date: 07/30/2024 CLINICAL DATA:  Elevated blood pressure. EXAM: CHEST - 2 VIEW COMPARISON:  Radiograph 08/30/2019, CT 03/31/2021 FINDINGS: The cardiomediastinal contours are normal. No definite radiographic correlate to the right upper lobe nodule on CT. Pulmonary vasculature is normal. No consolidation, pleural effusion, or pneumothorax. No acute osseous abnormalities are seen. IMPRESSION: No active cardiopulmonary disease. Electronically Signed   By: Andrea Gasman M.D.   On: 07/30/2024 14:05     Procedures   Medications Ordered in the ED  hydrALAZINE (APRESOLINE) injection 5 mg (has no administration in time range)    Clinical Course as of 07/30/24 1520  Sun Jul 30, 2024  1513 L sided facial pressure while at church @ 1100. No focal Sx with episode lasting 30 minutes. Now has pressure in neck. Abnormal HTN today. Waiting on CTA head and neck to r/o vertebral artery  dissection. Normal neuro exam. Anticipate going home with follow up with primary care. Provided some hydralazine.  [CB]    Clinical Course User Index [CB] Beola Terrall RAMAN, PA-C   Medical Decision Making Patricia Cantu is a 83 y.o. female patient who presents to the emergency department today for further evaluation of left-sided facial pressure.  Given the patient's elevated blood pressure and facial pressure TIA is on the concern in addition to vertebral dissection.  Patient does not have any significant focal deficits today.  CT Noncon was clean.  Rest of her labs were all reassuring.  Will give the patient some hydralazine for blood pressure in addition to getting a CTA head and neck.  If this is normal, I do feel the patient can likely be dispo.  Due to shift change, the rest the patient's care will be transferred to oncoming provider.  CTA head and neck still pending.   Amount and/or Complexity of Data Reviewed Radiology: ordered.  Risk Prescription drug management.     Final diagnoses:  None    ED Discharge Orders     None          Patricia Cantu, [place] 07/30/24 1520    Patricia Maude BROCKS, MD 07/31/24 [phone]

## 2024-07-30 NOTE — ED Provider Notes (Signed)
  Physical Exam  BP 136/67   Pulse 73   Temp (!) 97.3 F (36.3 C) (Oral)   Resp 12   Ht 5' 4.5 (1.638 m)   Wt 81.6 kg   SpO2 100%   BMI 30.42 kg/m   Physical Exam Vitals and nursing note reviewed.  Constitutional:      General: She is not in acute distress.    Appearance: Normal appearance. She is not ill-appearing.  Eyes:     General:        Right eye: No discharge.        Left eye: No discharge.     Extraocular Movements: Extraocular movements intact.     Conjunctiva/sclera: Conjunctivae normal.  Cardiovascular:     Rate and Rhythm: Normal rate and regular rhythm.  Pulmonary:     Effort: Pulmonary effort is normal. No respiratory distress.  Musculoskeletal:     Cervical back: Normal range of motion. No rigidity.  Neurological:     General: No focal deficit present.     Mental Status: She is alert and oriented to person, place, and time. Mental status is at baseline.     Sensory: No sensory deficit.     Motor: No weakness.     Procedures  Procedures  ED Course / MDM   Clinical Course as of 07/30/24 1747  Sun Jul 30, 2024  1513 L sided facial pressure while at church @ 1100. No focal Sx with episode lasting 30 minutes. Now has pressure in neck. Abnormal HTN today. Waiting on CTA head and neck to r/o vertebral artery dissection. Normal neuro exam. Anticipate going home with follow up with primary care. Provided some hydralazine.  [CB]    Clinical Course User Index [CB] Beola Terrall RAMAN, PA-C   Medical Decision Making Amount and/or Complexity of Data Reviewed Radiology: ordered.  Risk Prescription drug management.   See previous provider note.  Patient care transferred over from [facility] following PA-C.  At time of handoff, awaiting CTA head and neck, rule out vertebral artery dissection.  CTA did show some moderate P2 stenosis as well as mild atherosclerosis to carotid arteries.  Otherwise unremarkable.  Patient still currently symptomatic at this time on  evaluation, noted to be improved and not experiencing any symptoms.  Blood pressure controlled at this time.  With current presentation, will discharge with PCP follow-up, evaluation.  Patient noted that she has a PCP appointment in the upcoming week.  Low suspicion for any emergent condition present this time.  With no focal neurological deficits or findings, low suspicion for TIA, CVA.   Patient vital signs have remained stable throughout the course of patient's time in the ED. Low suspicion for any other emergent pathology at this time. I believe this patient is safe to be discharged. Provided strict return to ER precautions. Patient expressed agreement and understanding of plan. All questions were answered.       Beola Terrall RAMAN, PA-C 07/30/24 1749    Simon Lavonia SAILOR, MD 07/30/24 [phone]

## 2024-07-30 NOTE — ED Provider Triage Note (Signed)
 Emergency Medicine Provider Triage Evaluation Note  Patricia Cantu , a 83 y.o. female  was evaluated in triage.  Pt complains of sudden onset of right sided head pressure.  Not really pain.  No neck pain.  No chest pain no abdominal pain no nausea vomiting no shortness of breath no visual changes no speech problems.  No weakness no numbness.  Patient does have a history of hypertension and is on Cozaar .  Head significantly improved at the moment..  Review of Systems  Positive: As above Negative: As above  Physical Exam  BP (!) 151/130   Pulse 64   Temp 98.2 F (36.8 C)   Resp 20   Ht 1.638 m (5' 4.5)   Wt 81.6 kg   SpO2 98%   BMI 30.42 kg/m  Gen:  Awake, no distress   Head neck: Nontender full range of motion Resp: Normal effort  MSK:  Moves extremities without difficulty  Neuro: No neurofocal deficits Other:    Medical Decision Making  Medically screening exam initiated at 12:58 PM.  Appropriate orders placed.  Patricia Cantu was informed that the remainder of the evaluation will be completed by another provider, this initial triage assessment does not replace that evaluation, and the importance of remaining in the ED until their evaluation is complete.  Patient fairly asymptomatic blood pressure still elevated now 151/130.  Apparently originally it was 180/90 at church with EMS.  Get head CT CBC basic metabolic panel chest x-ray because of the blood pressure being so high and EKG.  Patient can go to waiting room.   Elly Haffey, MD 07/30/24 [phone]

## 2024-07-31 ENCOUNTER — Ambulatory Visit (INDEPENDENT_AMBULATORY_CARE_PROVIDER_SITE_OTHER): Admitting: Adult Health

## 2024-07-31 ENCOUNTER — Ambulatory Visit: Payer: Self-pay

## 2024-07-31 ENCOUNTER — Encounter: Payer: Self-pay | Admitting: Adult Health

## 2024-07-31 VITALS — BP 138/78 | HR 67 | Temp 96.2°F | Resp 18 | Ht 65.0 in | Wt 187.4 lb

## 2024-07-31 DIAGNOSIS — I10 Essential (primary) hypertension: Secondary | ICD-10-CM

## 2024-07-31 DIAGNOSIS — I2510 Atherosclerotic heart disease of native coronary artery without angina pectoris: Secondary | ICD-10-CM | POA: Diagnosis not present

## 2024-07-31 DIAGNOSIS — R519 Headache, unspecified: Secondary | ICD-10-CM | POA: Diagnosis not present

## 2024-07-31 DIAGNOSIS — J302 Other seasonal allergic rhinitis: Secondary | ICD-10-CM

## 2024-07-31 DIAGNOSIS — I83812 Varicose veins of left lower extremities with pain: Secondary | ICD-10-CM

## 2024-07-31 DIAGNOSIS — E039 Hypothyroidism, unspecified: Secondary | ICD-10-CM | POA: Diagnosis not present

## 2024-07-31 MED ORDER — HYDRALAZINE HCL 10 MG PO TABS
10.0000 mg | ORAL_TABLET | Freq: Two times a day (BID) | ORAL | 1 refills | Status: AC | PRN
Start: 2024-07-31 — End: ?

## 2024-07-31 MED ORDER — LORATADINE 10 MG PO TABS: 10.0000 mg | ORAL_TABLET | Freq: Every day | ORAL | Status: AC | PRN

## 2024-07-31 NOTE — Progress Notes (Signed)
 [facility]  Provider:  Jereld Serum DNP  Code Status:  DNR  Goals of Care:     07/30/2024   12:44 PM  Advanced Directives  Does Patient Have a Medical Advance Directive? No     Chief Complaint  Patient presents with   Hospitalization Follow-up    ER follow up-lm 07/31/24     Discussed the use of AI scribe software for clinical note transcription with the patient, who gave verbal consent to proceed.  HPI: Patient is a 83 y.o. female seen today for ER follow up. She is accompanied by her husband.  She experienced left-sided head pressure while sitting in church, accompanied by dizziness, which made her feel unable to stand or walk. Her systolic blood pressure was recorded at 180-210 mmHg, prompting emergency services to be called yesterday, 07/30/24. At the hospital, she underwent chest X-rays and two CT scans to rule out a transient ischemic attack or stroke. The CT scan showed no acute intracranial abnormalities. Chest x-ray was negative for acute cardiopulmonary disease.  She has a history of hypertension, managed with losartan  50 mg daily. During the emergency visit, she was administered hydralazine to manage her elevated blood pressure. At home, her blood pressure readings have been variable, with a recent reading of 161 mmHg in the morning before taking her medication. Over the past week, her blood pressure has been higher than usual, fluctuating between 145 and 160 mmHg.  She also has a history of hypothyroidism, managed with levothyroxine  50 mcg daily. Her recent metabolic panel indicated chronic kidney disease stage 3A, and she recalls being told about this in the past.  She has a family history of cardiovascular issues, with her mother having died of strokes at age 71 and her father of a heart attack at age 62. She is concerned about her own coronary artery disease, for which she takes atorvastatin  10 mg daily and a baby aspirin 81 mg daily. Her cholesterol levels  are well-managed, with an LDL of 74 mg/dL.  She has noticed a bothersome varicose vein in her right thigh, which causes discomfort when walking. She has a family history of varicose veins, with her daughter having undergone treatment for them.  No weakness in hands, vision changes, or difficulty walking. No swelling in legs.    Past Medical History:  Diagnosis Date   Acute perichondritis of pinna    Allergic rhinitis due to pollen    Disorder of bone and cartilage, unspecified    Encounter for long-term (current) use of other medications    History of echocardiogram    Echo 9/18: EF 65-70   History of exercise stress test 06/2017   ETT 9/18: no ischemia; hypertensive BP response   HLD (hyperlipidemia)    Osteoarthritis    Panic disorder    Unspecified dermatitis due to sun    Unspecified hypothyroidism     Past Surgical History:  Procedure Laterality Date   BREAST BIOPSY Right 07/14/2016   BREAST BIOPSY Right 07/15/2016   cataract surgery Bilateral 10/12/2016   childbirth     x2 blocks   COLONOSCOPY     x2   TONSILLECTOMY  1949   removed    Allergies  Allergen Reactions   Shellfish Allergy Diarrhea and Nausea And Vomiting    Outpatient Encounter Medications as of 07/31/2024  Medication Sig   acetaminophen (TYLENOL) 650 MG CR tablet Take 1,300 mg by mouth in the morning and at bedtime.   aspirin EC 81 MG tablet  Take 81 mg by mouth daily. Swallow whole.   atorvastatin  (LIPITOR) 10 MG tablet TAKE 1 TABLET BY MOUTH DAILY FOR CHOLESTEROL   CALCIUM  PO Take 1,000 mg by mouth daily.    Cholecalciferol (VITAMIN D ) 50 MCG (2000 UT) CAPS Take 1 capsule by mouth 3 (three) times a week.   citalopram  (CELEXA ) 20 MG tablet TAKE 1 TABLET (20 MG TOTAL) BY MOUTH DAILY.   diclofenac  Sodium (VOLTAREN ) 1 % GEL Apply 4 g topically 4 (four) times daily as needed.   famotidine  (PEPCID ) 10 MG tablet Take 1 tablet (10 mg total) by mouth 2 (two) times daily as needed for heartburn or  indigestion.   fish oil-omega-3 fatty acids 1000 MG capsule Take 1 g by mouth daily.    fluticasone  (FLONASE ) 50 MCG/ACT nasal spray Place 1 spray into both nostrils daily.   hydrALAZINE (APRESOLINE) 10 MG tablet Take 1 tablet (10 mg total) by mouth 2 (two) times daily as needed. For SBP >= 160   ketoconazole (NIZORAL) 2 % cream Apply 1 Application topically daily. As needed   levothyroxine  (SYNTHROID ) 50 MCG tablet TAKE 1 TABLET BY MOUTH ONCE DAILY 30 MINUTES PRIOR TO BREAKFAST FOR THYROID    loratadine (CLARITIN) 10 MG tablet Take 1 tablet (10 mg total) by mouth daily as needed for allergies.   losartan  (COZAAR ) 50 MG tablet TAKE 1 TABLET (50 MG TOTAL) BY MOUTH DAILY.   Multiple Vitamin (MULTIVITAMIN) tablet Take 1 tablet by mouth daily.   nystatin  cream (MYCOSTATIN ) Apply 1 Application topically 2 (two) times daily as needed.   triamcinolone  cream (KENALOG ) 0.1 % Apply 1 application topically daily as needed (itching).    Cholecalciferol (VITAMIN D3) 125 MCG (5000 UT) CAPS Take one capsule by mouth once daily. (Patient not taking: Reported on 07/31/2024)   No facility-administered encounter medications on file as of 07/31/2024.    Review of Systems:  Review of Systems  Constitutional:  Negative for appetite change, chills, fatigue and fever.  HENT:  Negative for congestion, hearing loss, rhinorrhea and sore throat.   Eyes: Negative.   Respiratory:  Negative for cough, shortness of breath and wheezing.   Cardiovascular:  Negative for chest pain, palpitations and leg swelling.  Gastrointestinal:  Negative for abdominal pain, constipation, diarrhea, nausea and vomiting.  Genitourinary:  Negative for dysuria.  Musculoskeletal:  Negative for arthralgias, back pain and myalgias.  Skin:  Negative for color change, rash and wound.  Neurological:  Negative for dizziness, weakness and headaches.  Psychiatric/Behavioral:  Negative for behavioral problems. The patient is not nervous/anxious.      Health Maintenance  Topic Date Due   Influenza Vaccine  05/05/2024   COVID-19 Vaccine (8 - 2025-26 season) 06/05/2024   Medicare Annual Wellness (AWV)  09/09/2024   DTaP/Tdap/Td (3 - Td or Tdap) 03/15/2028   Pneumococcal Vaccine: 50+ Years  Completed   DEXA SCAN  Completed   Zoster Vaccines- Shingrix  Completed   Meningococcal B Vaccine  Aged Out   Hepatitis B Vaccines 19-59 Average Risk  Discontinued   Hepatitis C Screening  Discontinued    Physical Exam: Vitals:   07/31/24 1118  BP: 138/78  Pulse: 67  Resp: 18  Temp: (!) 96.2 F (35.7 C)  SpO2: 92%  Weight: 187 lb 6.4 oz (85 kg)  Height: 5' 5 (1.651 m)   Body mass index is 31.18 kg/m. Physical Exam Constitutional:      Appearance: Normal appearance.  HENT:     Head: Normocephalic and atraumatic.  Nose: Nose normal.     Mouth/Throat:     Mouth: Mucous membranes are moist.  Eyes:     Conjunctiva/sclera: Conjunctivae normal.  Cardiovascular:     Rate and Rhythm: Normal rate and regular rhythm.  Pulmonary:     Effort: Pulmonary effort is normal.     Breath sounds: Normal breath sounds.  Abdominal:     General: Bowel sounds are normal.     Palpations: Abdomen is soft.  Musculoskeletal:        General: Normal range of motion.     Cervical back: Normal range of motion.  Skin:    General: Skin is warm and dry.     Comments: Left inner upper thigh with greenish varicose veins  Neurological:     General: No focal deficit present.     Mental Status: She is alert and oriented to person, place, and time.  Psychiatric:        Mood and Affect: Mood normal.        Behavior: Behavior normal.        Thought Content: Thought content normal.        Judgment: Judgment normal.     Labs reviewed: Basic Metabolic Panel: Recent Labs    05/12/24 0826 05/29/24 0836 07/30/24 1358  NA 135 135 137  K 4.6 4.7 4.2  CL 99 100 103  CO2 29 29 25   GLUCOSE 87 91 95  BUN 20 22 22   CREATININE 1.02* 1.01* 1.02*   CALCIUM  9.0 9.0 8.8*  TSH 2.36  --   --    Liver Function Tests: Recent Labs    05/12/24 0826  AST 17  ALT 14  BILITOT 0.8  PROT 6.2   No results for input(s): LIPASE, AMYLASE in the last 8760 hours. No results for input(s): AMMONIA in the last 8760 hours. CBC: Recent Labs    05/12/24 0826 05/29/24 0836 07/30/24 1358  WBC 6.3 6.7 8.6  NEUTROABS 3,383 4,047 5.8  HGB 13.5 13.3 13.5  HCT 40.9 40.1 40.6  MCV 98.8 98.0 97.4  PLT 287 279 268   Lipid Panel: Recent Labs    05/12/24 0826 05/29/24 0836  CHOL 173 161  HDL 75 66  LDLCALC 79 74  TRIG 105 127  CHOLHDL 2.3 2.4   Lab Results  Component Value Date   HGBA1C 5.2 03/20/2020    Procedures since last visit: CT ANGIO HEAD NECK W WO CM Result Date: 07/30/2024 EXAM: CTA HEAD AND NECK WITH AND WITHOUT 07/30/2024 03:25:02 PM TECHNIQUE: CTA of the head and neck was performed with and without the administration of 75 mL of intravenous iohexol (OMNIPAQUE) 350 MG/ML injection. Multiplanar 2D and/or 3D reformatted images are provided for review. Automated exposure control, iterative reconstruction, and/or weight based adjustment of the mA/kV was utilized to reduce the radiation dose to as low as reasonably achievable. Stenosis of the internal carotid arteries measured using NASCET criteria. COMPARISON: None available CLINICAL HISTORY: Facial pressure. Per [facility] pt coming from church- reports about 2 hours ago started experiencing pressure to right side of her head. Hx of HTN. Initial BP 180/90. Denies any chest pain, shortness of breath or numbness. States pressure in head has relieved mostly. FINDINGS: CTA NECK: AORTIC ARCH AND ARCH VESSELS: Atherosclerotic calcifications are present at the aorta and great vessel origins without aneurysm or focal stenosis. An aberrant right subclavian artery is noted. No dissection or arterial injury. CERVICAL CAROTID ARTERIES: Minimal atherosclerotic change is present at the right carotid  bifurcation and proximal right ICA without significant stenosis. Minimal calcification is present at the left carotid bifurcation without significant stenosis. No dissection or arterial injury. CERVICAL VERTEBRAL ARTERIES: The vertebral arteries are codominant. No dissection, arterial injury, or significant stenosis. LUNGS AND MEDIASTINUM: Unremarkable. SOFT TISSUES: No acute abnormality. BONES: No acute abnormality. CTA HEAD: ANTERIOR CIRCULATION: No significant stenosis of the internal carotid arteries. No significant stenosis of the anterior cerebral arteries. No significant stenosis of the middle cerebral arteries. No aneurysm. POSTERIOR CIRCULATION: Moderate stenosis is present in the distal P2 segments bilaterally. No significant stenosis of the basilar artery. No significant stenosis of the vertebral arteries. No aneurysm. OTHER: No dural venous sinus thrombosis on this non-dedicated study. IMPRESSION: 1. Moderate bilateral distal P2 segment stenosis. 2. Minimal atherosclerotic change at the right carotid bifurcation and proximal right ICA, and minimal calcification at the left carotid bifurcation, without significant stenosis. 3. Atherosclerotic calcifications at the aorta and great vessel origins, without aneurysm or focal stenosis. Electronically signed by: Lonni Necessary MD 07/30/2024 04:05 PM EDT RP Workstation: [id number]   CT Head Wo Contrast Result Date: 07/30/2024 EXAM: CT HEAD WITHOUT CONTRAST 07/30/2024 01:52:19 PM TECHNIQUE: CT of the head was performed without the administration of intravenous contrast. Automated exposure control, iterative reconstruction, and/or weight based adjustment of the mA/kV was utilized to reduce the radiation dose to as low as reasonably achievable. COMPARISON: None available. CLINICAL HISTORY: Headache, new onset (Age >= 51y). FINDINGS: BRAIN AND VENTRICLES: No acute hemorrhage. No evidence of acute infarct. No hydrocephalus. No extra-axial collection. No mass  effect or midline shift. Periventricular white matter changes demonstrate some progression and are mildly advanced for age. Intracranial atherosclerosis. ORBITS: Bilateral lens replacements are noted. The globes and orbits are otherwise within normal limits. SINUSES: No acute abnormality. SOFT TISSUES AND SKULL: No acute soft tissue abnormality. No skull fracture. IMPRESSION: 1. No acute intracranial abnormality. 2. Mildly advanced periventricular white matter changes for age, with interval progression. 3. Intracranial atherosclerosis. Electronically signed by: Lonni Necessary MD 07/30/2024 02:08 PM EDT RP Workstation: [id number]   DG Chest 2 View Result Date: 07/30/2024 CLINICAL DATA:  Elevated blood pressure. EXAM: CHEST - 2 VIEW COMPARISON:  Radiograph 08/30/2019, CT 03/31/2021 FINDINGS: The cardiomediastinal contours are normal. No definite radiographic correlate to the right upper lobe nodule on CT. Pulmonary vasculature is normal. No consolidation, pleural effusion, or pneumothorax. No acute osseous abnormalities are seen. IMPRESSION: No active cardiopulmonary disease. Electronically Signed   By: Andrea Gasman M.D.   On: 07/30/2024 14:05    Assessment/Plan  1. Pressure in head (Primary) -  CT head was negative for acute infarct -  Symptoms resolved without evidence of stroke or TIA. Likely exacerbated by stress.  2. Primary hypertension -  Managed with losartan . Recent hypertensive urgency episode. Current blood pressure acceptable. Home readings variable. - Continue losartan  50 mg daily. - Prescribe hydralazine 10 mg twice daily as needed for systolic blood pressure > =160 mmHg. - Monitor blood pressure daily and log readings. - hydrALAZINE (APRESOLINE) 10 MG tablet; Take 1 tablet (10 mg total) by mouth 2 (two) times daily as needed. For SBP >= 160  Dispense: 30 tablet; Refill: 1  3. Hypothyroidism, unspecified type Lab Results  Component Value Date   TSH 2.36 05/12/2024    -   Managed with levothyroxine . - Continue levothyroxine  50 mcg daily.  4. Coronary artery disease involving native coronary artery of native heart without angina pectoris -  denies chest pains -  Managed with atorvastatin  and  aspirin. LDL well-controlled. Family history noted. - Continue atorvastatin  10 mg daily. - Continue aspirin 81 mg daily. - Encourage consumption of green leafy and cruciferous vegetables.  5. Seasonal allergies -  Symptoms include sneezing and throat irritation. - Continue loratadine as needed.  - loratadine (CLARITIN) 10 MG tablet; Take 1 tablet (10 mg total) by mouth daily as needed for allergies.  6. Varicose Veins of left lower extremity with pain -  Causing discomfort, especially when standing. No swelling. Family history noted. - Apply ACE wrap to relieve pressure. - Consider compression stockings if symptoms persist.   Labs/tests ordered:  None   Return if symptoms worsen or fail to improve.  Lainie Daubert Medina-Vargas, NP

## 2024-07-31 NOTE — Telephone Encounter (Signed)
 FYI Only or Action Required?: Action required by provider: request for appointment.  Patient was last seen in primary care on 05/15/2024 by Cantu Harlene POUR, NP.  Called Nurse Triage reporting No chief complaint on file..  Symptoms began today.  Interventions attempted: Nothing.  Symptoms are: unchanged .  Triage Disposition: See PCP Within 2 Weeks  Patient/caregiver understands and will follow disposition?: Yes   Copied from CRM [id number]. Topic: Clinical - Red Word Triage >> Jul 31, 2024  9:10 AM Patricia Cantu wrote: Red Word that prompted transfer to Nurse Triage: patient was in ER yesterday for blood pressure was  210 , was carry to hospital by ambalance yesterday  pt. just woke up this morning and have not taking her blood pressure and stated she feeling okay this morning, patient provider harlene Cantu want to patient to be seen today 07/31/24    ----------------------------------------------------------------------- From previous Reason for Contact - Scheduling: Patient/patient representative is calling to schedule an appointment. Refer to attachments for appointment information. Reason for Disposition  Requesting regular office appointment  Answer Assessment - Initial Assessment Questions 1. REASON FOR CALL: What is the main reason for your call? or How can I best help you?     Schedule a follow up appointment with Patricia Caro, NP  2. SYMPTOMS : Do you have any symptoms?      None at this time  3. OTHER QUESTIONS: Do you have any other questions?     No other questions  Protocols used: Information Only Call - No Triage-A-AH

## 2024-07-31 NOTE — Telephone Encounter (Signed)
Patient scheduled to see PCP today.

## 2024-08-12 ENCOUNTER — Encounter: Payer: Self-pay | Admitting: Nurse Practitioner

## 2024-08-12 DIAGNOSIS — I10 Essential (primary) hypertension: Secondary | ICD-10-CM

## 2024-08-14 DIAGNOSIS — M170 Bilateral primary osteoarthritis of knee: Secondary | ICD-10-CM | POA: Diagnosis not present

## 2024-08-14 MED ORDER — LOSARTAN POTASSIUM 100 MG PO TABS: 100.0000 mg | ORAL_TABLET | Freq: Every day | ORAL | 1 refills | Status: DC

## 2024-08-18 ENCOUNTER — Ambulatory Visit: Payer: Self-pay

## 2024-08-18 NOTE — Telephone Encounter (Signed)
 See Triage Notes from Triage Nurse

## 2024-08-18 NOTE — Telephone Encounter (Signed)
 FYI Only or Action Required?: FYI only for provider: appointment scheduled on 08/21/2024.  Patient was last seen in primary care on 07/31/2024 by Medina-Vargas, Jereld BROCKS, NP.  Called Nurse Triage reporting Hypertension.  Symptoms began a week ago.  Interventions attempted: Prescription medications: hydralazine, losartan .  Symptoms are: unchanged.  Triage Disposition: See PCP When Office is Open (Within 3 Days)  Patient/caregiver understands and will follow disposition?: Yes  Copied from CRM #[id number]. Topic: Clinical - Red Word Triage >> Aug 18, 2024  2:07 PM Merlynn A wrote: Red Word that prompted transfer to Nurse Triage: High BP 150-176, chills, weakness Reason for Disposition  Systolic BP >= 160 OR Diastolic >= 100  Answer Assessment - Initial Assessment Questions 1. BLOOD PRESSURE: What is your blood pressure? Did you take at least two measurements 5 minutes apart?     Measured 2 hours after medication, 158/89 2. ONSET: When did you take your blood pressure?     Elevated this week 3. HOW: How did you take your blood pressure? (e.g., automatic home BP monitor, visiting nurse)     Automatic cuff 4. HISTORY: Do you have a history of high blood pressure?     Positive for history of HTN 5. MEDICINES: Are you taking any medicines for blood pressure? Have you missed any doses recently?     Losartan , recent increase in dosage, hydralazine PRN No change in BP 6. OTHER SYMPTOMS: Do you have any symptoms? (e.g., blurred vision, chest pain, difficulty breathing, headache, weakness)     Shaky, negative for all other symptoms  Protocols used: Blood Pressure - High-A-AH

## 2024-08-19 ENCOUNTER — Other Ambulatory Visit (HOSPITAL_BASED_OUTPATIENT_CLINIC_OR_DEPARTMENT_OTHER)

## 2024-08-21 ENCOUNTER — Ambulatory Visit: Admitting: Nurse Practitioner

## 2024-08-21 ENCOUNTER — Ambulatory Visit: Admitting: Adult Health

## 2024-08-21 VITALS — BP 146/92 | HR 79 | Temp 97.7°F | Ht 65.0 in | Wt 192.2 lb

## 2024-08-21 DIAGNOSIS — I129 Hypertensive chronic kidney disease with stage 1 through stage 4 chronic kidney disease, or unspecified chronic kidney disease: Secondary | ICD-10-CM | POA: Diagnosis not present

## 2024-08-21 DIAGNOSIS — F411 Generalized anxiety disorder: Secondary | ICD-10-CM | POA: Diagnosis not present

## 2024-08-21 DIAGNOSIS — I2510 Atherosclerotic heart disease of native coronary artery without angina pectoris: Secondary | ICD-10-CM | POA: Diagnosis not present

## 2024-08-21 DIAGNOSIS — E039 Hypothyroidism, unspecified: Secondary | ICD-10-CM

## 2024-08-21 DIAGNOSIS — N1831 Chronic kidney disease, stage 3a: Secondary | ICD-10-CM

## 2024-08-21 DIAGNOSIS — F410 Panic disorder [episodic paroxysmal anxiety] without agoraphobia: Secondary | ICD-10-CM

## 2024-08-21 MED ORDER — METOPROLOL TARTRATE 25 MG PO TABS
12.5000 mg | ORAL_TABLET | Freq: Two times a day (BID) | ORAL | 3 refills | Status: AC
Start: 2024-08-21 — End: ?

## 2024-08-21 NOTE — Progress Notes (Signed)
 [facility] clinic  Provider:  Jereld Serum DNP  Code Status:  DNR  Goals of Care:     07/30/2024   12:44 PM  Advanced Directives  Does Patient Have a Medical Advance Directive? No     Chief Complaint  Patient presents with   Hypertension   Gastroesophageal Reflux    Patient states that its more frequent than usual    Discussed the use of AI scribe software for clinical note transcription with the patient, who gave verbal consent to proceed.   HPI: Patient is a 83 y.o. female seen today for an acute visit for uncontrolled hypertension.  Her blood pressure has been elevated since her last hospital visit on July 30, 2024, with home readings consistently high, including 166/94, 162/94, 164/94, 147/94, 151/94, 160/94, 158/94, and 159/94. She is currently taking losartan  100 mg and hydralazine 10 mg twice a day as needed for systolic blood pressure > = 160, but her blood pressure remains high in the mornings despite medication. No chest pain or dizziness reported. She follows a low-salt diet, prepares her own meals, and has recently gained weight after a cruise, noting a previous weight of 178 lbs last year.   She has a history of panic disorder and takes Celexa  20 mg daily. She recalls an episode of panic when her brother-in-law died suddenly, which prevented her from traveling to the airport. She describes the panic as 'that whole panic stuff that I didn't want.'  She has coronary artery disease and takes a baby aspirin 81 mg daily and atorvastatin  10 mg daily. Her cholesterol levels were checked two months ago and were reported as good, with an LDL of 74.  She has hypothyroidism and takes levothyroxine  50 mcg daily. Her last TSH was within normal limits at 2.36 on May 12, 2024.  She has chronic kidney disease stage 3A, with a GFR of 55 as of her last test. 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 She drives regularly and recently traveled  to [place]  to visit a relative. She lives in [facility], 15 minutes from the clinic.      Past Medical History:  Diagnosis Date   Acute perichondritis of pinna    Allergic rhinitis due to pollen    Disorder of bone and cartilage, unspecified    Encounter for long-term (current) use of other medications    History of echocardiogram    Echo 9/18: EF 65-70   History of exercise stress test 06/2017   ETT 9/18: no ischemia; hypertensive BP response   HLD (hyperlipidemia)    Osteoarthritis    Panic disorder    Unspecified dermatitis due to sun    Unspecified hypothyroidism     Past Surgical History:  Procedure Laterality Date   BREAST BIOPSY Right 07/14/2016   BREAST BIOPSY Right 07/15/2016   cataract surgery Bilateral 10/12/2016   childbirth     x2 blocks   COLONOSCOPY     x2   TONSILLECTOMY  1949   removed    Allergies  Allergen Reactions   Shellfish Allergy Diarrhea and Nausea And Vomiting    Outpatient Encounter Medications as of 08/21/2024  Medication Sig   acetaminophen (TYLENOL) 650 MG CR tablet Take 1,300 mg by mouth in the morning and at bedtime.   aspirin EC 81 MG tablet Take 81 mg by mouth daily. Swallow whole.   atorvastatin  (LIPITOR) 10 MG tablet TAKE 1 TABLET BY MOUTH DAILY FOR CHOLESTEROL  CALCIUM  PO Take 1,000 mg by mouth daily.    Cholecalciferol (VITAMIN D3) 125 MCG (5000 UT) CAPS Take one capsule by mouth once daily. (Patient taking differently: Every other day)   citalopram  (CELEXA ) 20 MG tablet TAKE 1 TABLET (20 MG TOTAL) BY MOUTH DAILY.   diclofenac  Sodium (VOLTAREN ) 1 % GEL Apply 4 g topically 4 (four) times daily as needed.   famotidine  (PEPCID ) 10 MG tablet Take 1 tablet (10 mg total) by mouth 2 (two) times daily as needed for heartburn or indigestion.   fish oil-omega-3 fatty acids 1000 MG capsule Take 1 g by mouth daily.    fluticasone  (FLONASE ) 50 MCG/ACT nasal spray Place 1 spray into both nostrils daily.   hydrALAZINE (APRESOLINE) 10 MG  tablet Take 1 tablet (10 mg total) by mouth 2 (two) times daily as needed. For SBP >= 160   ketoconazole (NIZORAL) 2 % cream Apply 1 Application topically daily. As needed   levothyroxine  (SYNTHROID ) 50 MCG tablet TAKE 1 TABLET BY MOUTH ONCE DAILY 30 MINUTES PRIOR TO BREAKFAST FOR THYROID    loratadine (CLARITIN) 10 MG tablet Take 1 tablet (10 mg total) by mouth daily as needed for allergies.   losartan  (COZAAR ) 100 MG tablet Take 1 tablet (100 mg total) by mouth daily.   metoprolol tartrate (LOPRESSOR) 25 MG tablet Take 0.5 tablets (12.5 mg total) by mouth 2 (two) times daily.   Multiple Vitamin (MULTIVITAMIN) tablet Take 1 tablet by mouth daily.   nystatin  cream (MYCOSTATIN ) Apply 1 Application topically 2 (two) times daily as needed.   triamcinolone  cream (KENALOG ) 0.1 % Apply 1 application topically daily as needed (itching).    Cholecalciferol (VITAMIN D ) 50 MCG (2000 UT) CAPS Take 1 capsule by mouth 3 (three) times a week. (Patient not taking: Reported on 08/21/2024)   No facility-administered encounter medications on file as of 08/21/2024.    Review of Systems:  Review of Systems  Constitutional:  Negative for appetite change, chills, fatigue and fever.  HENT:  Negative for congestion, hearing loss, rhinorrhea and sore throat.   Eyes: Negative.   Respiratory:  Negative for cough, shortness of breath and wheezing.   Cardiovascular:  Negative for chest pain, palpitations and leg swelling.  Gastrointestinal:  Negative for abdominal pain, constipation, diarrhea, nausea and vomiting.  Genitourinary:  Negative for dysuria.  Musculoskeletal:  Negative for arthralgias, back pain and myalgias.  Skin:  Negative for color change, rash and wound.  Neurological:  Negative for dizziness, weakness and headaches.  Psychiatric/Behavioral:  Negative for behavioral problems. The patient is not nervous/anxious.     Health Maintenance  Topic Date Due   Influenza Vaccine  05/05/2024   COVID-19  Vaccine (8 - 2025-26 season) 06/05/2024   Medicare Annual Wellness (AWV)  09/09/2024   DTaP/Tdap/Td (3 - Td or Tdap) 03/15/2028   Pneumococcal Vaccine: 50+ Years  Completed   DEXA SCAN  Completed   Zoster Vaccines- Shingrix  Completed   Meningococcal B Vaccine  Aged Out   Hepatitis B Vaccines 19-59 Average Risk  Discontinued   Hepatitis C Screening  Discontinued    Physical Exam: Vitals:   08/21/24 1455  BP: (!) 146/92  Pulse: 79  Temp: 97.7 F (36.5 C)  TempSrc: Temporal  SpO2: 94%  Weight: 192 lb 3.2 oz (87.2 kg)  Height: 5' 5 (1.651 m)   Body mass index is 31.98 kg/m. Physical Exam Constitutional:      General: She is not in acute distress.    Appearance: She is  obese.  HENT:     Head: Normocephalic and atraumatic.     Nose: Nose normal.     Mouth/Throat:     Mouth: Mucous membranes are moist.  Eyes:     Conjunctiva/sclera: Conjunctivae normal.  Cardiovascular:     Rate and Rhythm: Normal rate and regular rhythm.  Pulmonary:     Effort: Pulmonary effort is normal.     Breath sounds: Normal breath sounds.  Abdominal:     General: Bowel sounds are normal.     Palpations: Abdomen is soft.  Musculoskeletal:        General: Normal range of motion.     Cervical back: Normal range of motion.  Skin:    General: Skin is warm and dry.  Neurological:     General: No focal deficit present.     Mental Status: She is alert and oriented to person, place, and time.  Psychiatric:        Mood and Affect: Mood normal.        Behavior: Behavior normal.        Thought Content: Thought content normal.        Judgment: Judgment normal.     Labs reviewed: Basic Metabolic Panel: Recent Labs    05/12/24 0826 05/29/24 0836 07/30/24 1358  NA 135 135 137  K 4.6 4.7 4.2  CL 99 100 103  CO2 29 29 25   GLUCOSE 87 91 95  BUN 20 22 22   CREATININE 1.02* 1.01* 1.02*  CALCIUM  9.0 9.0 8.8*  TSH 2.36  --   --    Liver Function Tests: Recent Labs    05/12/24 0826  AST 17   ALT 14  BILITOT 0.8  PROT 6.2   No results for input(s): LIPASE, AMYLASE in the last 8760 hours. No results for input(s): AMMONIA in the last 8760 hours. CBC: Recent Labs    05/12/24 0826 05/29/24 0836 07/30/24 1358  WBC 6.3 6.7 8.6  NEUTROABS 3,383 4,047 5.8  HGB 13.5 13.3 13.5  HCT 40.9 40.1 40.6  MCV 98.8 98.0 97.4  PLT 287 279 268   Lipid Panel: Recent Labs    05/12/24 0826 05/29/24 0836  CHOL 173 161  HDL 75 66  LDLCALC 79 74  TRIG 105 127  CHOLHDL 2.3 2.4   Lab Results  Component Value Date   HGBA1C 5.2 03/20/2020    Procedures since last visit: CT ANGIO HEAD NECK W WO CM Result Date: 07/30/2024 EXAM: CTA HEAD AND NECK WITH AND WITHOUT 07/30/2024 03:25:02 PM TECHNIQUE: CTA of the head and neck was performed with and without the administration of 75 mL of intravenous iohexol (OMNIPAQUE) 350 MG/ML injection. Multiplanar 2D and/or 3D reformatted images are provided for review. Automated exposure control, iterative reconstruction, and/or weight based adjustment of the mA/kV was utilized to reduce the radiation dose to as low as reasonably achievable. Stenosis of the internal carotid arteries measured using NASCET criteria. COMPARISON: None available CLINICAL HISTORY: Facial pressure. Per [facility] pt coming from church- reports about 2 hours ago started experiencing pressure to right side of her head. Hx of HTN. Initial BP 180/90. Denies any chest pain, shortness of breath or numbness. States pressure in head has relieved mostly. FINDINGS: CTA NECK: AORTIC ARCH AND ARCH VESSELS: Atherosclerotic calcifications are present at the aorta and great vessel origins without aneurysm or focal stenosis. An aberrant right subclavian artery is noted. No dissection or arterial injury. CERVICAL CAROTID ARTERIES: Minimal atherosclerotic change is present at the right  carotid bifurcation and proximal right ICA without significant stenosis. Minimal calcification is present at the left  carotid bifurcation without significant stenosis. No dissection or arterial injury. CERVICAL VERTEBRAL ARTERIES: The vertebral arteries are codominant. No dissection, arterial injury, or significant stenosis. LUNGS AND MEDIASTINUM: Unremarkable. SOFT TISSUES: No acute abnormality. BONES: No acute abnormality. CTA HEAD: ANTERIOR CIRCULATION: No significant stenosis of the internal carotid arteries. No significant stenosis of the anterior cerebral arteries. No significant stenosis of the middle cerebral arteries. No aneurysm. POSTERIOR CIRCULATION: Moderate stenosis is present in the distal P2 segments bilaterally. No significant stenosis of the basilar artery. No significant stenosis of the vertebral arteries. No aneurysm. OTHER: No dural venous sinus thrombosis on this non-dedicated study. IMPRESSION: 1. Moderate bilateral distal P2 segment stenosis. 2. Minimal atherosclerotic change at the right carotid bifurcation and proximal right ICA, and minimal calcification at the left carotid bifurcation, without significant stenosis. 3. Atherosclerotic calcifications at the aorta and great vessel origins, without aneurysm or focal stenosis. Electronically signed by: Lonni Necessary MD 07/30/2024 04:05 PM EDT RP Workstation: [id number]   CT Head Wo Contrast Result Date: 07/30/2024 EXAM: CT HEAD WITHOUT CONTRAST 07/30/2024 01:52:19 PM TECHNIQUE: CT of the head was performed without the administration of intravenous contrast. Automated exposure control, iterative reconstruction, and/or weight based adjustment of the mA/kV was utilized to reduce the radiation dose to as low as reasonably achievable. COMPARISON: None available. CLINICAL HISTORY: Headache, new onset (Age >= 51y). FINDINGS: BRAIN AND VENTRICLES: No acute hemorrhage. No evidence of acute infarct. No hydrocephalus. No extra-axial collection. No mass effect or midline shift. Periventricular white matter changes demonstrate some progression and are mildly  advanced for age. Intracranial atherosclerosis. ORBITS: Bilateral lens replacements are noted. The globes and orbits are otherwise within normal limits. SINUSES: No acute abnormality. SOFT TISSUES AND SKULL: No acute soft tissue abnormality. No skull fracture. IMPRESSION: 1. No acute intracranial abnormality. 2. Mildly advanced periventricular white matter changes for age, with interval progression. 3. Intracranial atherosclerosis. Electronically signed by: Lonni Necessary MD 07/30/2024 02:08 PM EDT RP Workstation: [id number]   DG Chest 2 View Result Date: 07/30/2024 CLINICAL DATA:  Elevated blood pressure. EXAM: CHEST - 2 VIEW COMPARISON:  Radiograph 08/30/2019, CT 03/31/2021 FINDINGS: The cardiomediastinal contours are normal. No definite radiographic correlate to the right upper lobe nodule on CT. Pulmonary vasculature is normal. No consolidation, pleural effusion, or pneumothorax. No acute osseous abnormalities are seen. IMPRESSION: No active cardiopulmonary disease. Electronically Signed   By: Andrea Gasman M.D.   On: 07/30/2024 14:05    Assessment/Plan  1. Benign hypertension with stage 3a chronic kidney disease (HCC) (Primary) -  uncontrolled with current regimen. CKD stage 3A with GFR 55. Atherosclerotic heart disease with moderate stenosis. LDL slightly above target. - Started metoprolol tartrate 25 mg, half tablet twice daily. - Continue losartan  and hydralazine as needed. - Referred to cardiology for uncontrolled hypertension and coronary artery disease. - Advised low-salt diet and weight management. - Monitor blood pressure and heart rate at home. - Scheduled follow-up appointment in one month. - metoprolol tartrate (LOPRESSOR) 25 MG tablet; Take 0.5 tablets (12.5 mg total) by mouth 2 (two) times daily.  Dispense: 30 tablet; Refill: 3 - Ambulatory referral to Cardiology  2. Coronary artery disease involving native coronary artery of native heart without angina pectoris - CT  angio head and neck done on 10/26 showed moderate bilateral distal P2 segment stenosis, minimal atherosclerotic changes of the right carotid bifurcation and proximal right ICA and minimal calcification at the  left carotid bifurcation without significant stenosis, atherosclerotic calcifications at the aorta and great vessel origins, without aneurysm or focal stenosis. -  referred to cardiology  3. Generalized anxiety disorder with panic attacks -  Managed with Celexa . - Continue Celexa  20 mg daily.   4. Hypothyroidism, unspecified type -  Well-managed with levothyroxine . TSH within normal range. - Continue levothyroxine  50 mcg daily.     Labs/tests ordered:  None   Return in about 4 weeks (around 09/18/2024), or if symptoms worsen or fail to improve.  Anaissa Macfadden Medina-Vargas, NP

## 2024-09-15 ENCOUNTER — Encounter: Payer: Medicare Other | Admitting: Nurse Practitioner

## 2024-09-18 ENCOUNTER — Ambulatory Visit (INDEPENDENT_AMBULATORY_CARE_PROVIDER_SITE_OTHER): Payer: Self-pay | Admitting: Nurse Practitioner

## 2024-09-18 ENCOUNTER — Encounter: Payer: Self-pay | Admitting: Nurse Practitioner

## 2024-09-18 ENCOUNTER — Other Ambulatory Visit: Payer: Self-pay | Admitting: Nurse Practitioner

## 2024-09-18 VITALS — BP 126/72 | HR 77 | Temp 96.0°F | Resp 18 | Ht 65.0 in | Wt 195.6 lb

## 2024-09-18 DIAGNOSIS — Z0000 Encounter for general adult medical examination without abnormal findings: Secondary | ICD-10-CM

## 2024-09-18 MED ORDER — TRAZODONE HCL 50 MG PO TABS: 25.0000 mg | ORAL_TABLET | Freq: Every evening | ORAL | 3 refills | Status: DC | PRN

## 2024-09-18 NOTE — Patient Instructions (Signed)
 Ms. Prosser,  Thank you for taking the time for your Medicare Wellness Visit. I appreciate your continued commitment to your health goals. Please review the care plan we discussed, and feel free to reach out if I can assist you further.  Please note that Annual Wellness Visits do not include a physical exam. Some assessments may be limited, especially if the visit was conducted virtually. If needed, we may recommend an in-person follow-up with your provider.  Ongoing Care Seeing your primary care provider every 3 to 6 months helps us  monitor your health and provide consistent, personalized care.   Referrals If a referral was made during today's visit and you haven't received any updates within two weeks, please contact the referred provider directly to check on the status.  Recommended Screenings:  Health Maintenance  Topic Date Due   COVID-19 Vaccine (8 - 2025-26 season) 06/05/2024   Medicare Annual Wellness Visit  09/09/2024   DTaP/Tdap/Td vaccine (3 - Td or Tdap) 03/15/2028   Pneumococcal Vaccine for age over 70  Completed   Flu Shot  Completed   Osteoporosis screening with Bone Density Scan  Completed   Zoster (Shingles) Vaccine  Completed   Meningitis B Vaccine  Aged Out   Hepatitis B Vaccine  Discontinued   Hepatitis C Screening  Discontinued       09/18/2024    2:59 PM  Advanced Directives  Does Patient Have a Medical Advance Directive? Yes  Type of Estate Agent of [place];Living will  Does patient want to make changes to medical advance directive? No - Patient declined  Copy of Healthcare Power of Attorney in Chart? Yes - validated most recent copy scanned in chart (See row information)    Vision: Annual vision screenings are recommended for early detection of glaucoma, cataracts, and diabetic retinopathy. These exams can also reveal signs of chronic conditions such as diabetes and high blood pressure.  Dental: Annual dental screenings help detect  early signs of oral cancer, gum disease, and other conditions linked to overall health, including heart disease and diabetes.  Please see the attached documents for additional preventive care recommendations.

## 2024-09-18 NOTE — Progress Notes (Signed)
 Chief Complaint  Patient presents with   Medicare Wellness     MEDICARE AWV, SEQUENTIAL. Patient has not gotten her covid vaccine but will get it done.     Subjective:   Patricia Cantu is a 83 y.o. female who presents for a Medicare Annual Wellness Visit.  Visit info / Clinical Intake: Medicare Wellness Visit Type:: Subsequent Annual Wellness Visit Persons participating in visit and providing information:: patient Medicare Wellness Visit Mode:: In-person (required for WTM) Interpreter Needed?: No Pre-visit prep was completed: no AWV questionnaire completed by patient prior to visit?: yes Date:: 09/18/24 Living arrangements:: lives with spouse/significant other Patient's Overall Health Status Rating: very good Typical amount of pain: some Does pain affect daily life?: no  Dietary Habits and Nutritional Risks How many meals a day?: 3 Eats fruit and vegetables daily?: yes Most meals are obtained by: preparing own meals; eating out In the last 2 weeks, have you had any of the following?: none Diabetic:: no  Functional Status Activities of Daily Living (to include ambulation/medication): Independent Ambulation: Independent Medication Administration: Independent Home Management (perform basic housework or laundry): Independent Manage your own finances?: yes Primary transportation is: driving Concerns about vision?: no *vision screening is required for WTM* Concerns about hearing?: no  Fall Screening Falls in the past year?: 0 Number of falls in past year: 0 Was there an injury with Fall?: 0 Fall Risk Category Calculator: 0 Patient Fall Risk Level: Low Fall Risk  Fall Risk Patient at Risk for Falls Due to: No Fall Risks Fall risk Follow up: Falls evaluation completed  Home and Transportation Safety: All rugs have non-skid backing?: yes All stairs or steps have railings?: N/A, no stairs Grab bars in the bathtub or shower?: yes Have non-skid surface in bathtub or  shower?: yes Good home lighting?: yes Regular seat belt use?: yes Hospital stays in the last year:: no  Cognitive Assessment Difficulty concentrating, remembering, or making decisions? : no Will 6CIT or Mini Cog be Completed: yes What year is it?: 0 points What month is it?: 0 points Give patient an address phrase to remember (5 components): [address], [place], [place] About what time is it?: 0 points Count backwards from 20 to 1: 0 points Say the months of the year in reverse: 0 points Repeat the address phrase from earlier: 2 points (1545 incorrect) 6 CIT Score: 2 points  Advance Directives (For Healthcare) Does Patient Have a Medical Advance Directive?: Yes Does patient want to make changes to medical advance directive?: No - Patient declined Type of Advance Directive: Healthcare Power of [place]; Living will Copy of Healthcare Power of Attorney in Chart?: Yes - validated most recent copy scanned in chart (See row information) Copy of Living Will in Chart?: Yes - validated most recent copy scanned in chart (See row information) Out of facility DNR (pink MOST or yellow form) in Chart? (Ambulatory ONLY): Yes - validated most recent copy scanned in chart Pre-existing out of facility DNR order (yellow form or pink MOST form): Yellow form placed in chart (order not valid for inpatient use)  Reviewed/Updated  Reviewed/Updated: Reviewed All (Medical, Surgical, Family, Medications, Allergies, Care Teams, Patient Goals)    Allergies (verified) Shellfish allergy   Current Medications (verified) Outpatient Encounter Medications as of 09/18/2024  Medication Sig   acetaminophen (TYLENOL) 650 MG CR tablet Take 1,300 mg by mouth in the morning and at bedtime.   aspirin EC 81 MG tablet Take 81 mg by mouth daily. Swallow whole.  atorvastatin  (LIPITOR) 10 MG tablet TAKE 1 TABLET BY MOUTH DAILY FOR CHOLESTEROL   CALCIUM  PO Take 1,000 mg by mouth daily.    Cholecalciferol  (VITAMIN D3) 125 MCG (5000 UT) CAPS Take one capsule by mouth once daily. (Patient taking differently: Every other day)   citalopram  (CELEXA ) 20 MG tablet TAKE 1 TABLET (20 MG TOTAL) BY MOUTH DAILY.   diclofenac  Sodium (VOLTAREN ) 1 % GEL Apply 4 g topically 4 (four) times daily as needed.   famotidine  (PEPCID ) 10 MG tablet Take 1 tablet (10 mg total) by mouth 2 (two) times daily as needed for heartburn or indigestion.   fish oil-omega-3 fatty acids 1000 MG capsule Take 1 g by mouth daily.    fluticasone  (FLONASE ) 50 MCG/ACT nasal spray Place 1 spray into both nostrils daily.   hydrALAZINE  (APRESOLINE ) 10 MG tablet Take 1 tablet (10 mg total) by mouth 2 (two) times daily as needed. For SBP >= 160   ketoconazole (NIZORAL) 2 % cream Apply 1 Application topically daily. As needed   levothyroxine  (SYNTHROID ) 50 MCG tablet TAKE 1 TABLET BY MOUTH ONCE DAILY 30 MINUTES PRIOR TO BREAKFAST FOR THYROID    loratadine  (CLARITIN ) 10 MG tablet Take 1 tablet (10 mg total) by mouth daily as needed for allergies.   losartan  (COZAAR ) 100 MG tablet Take 1 tablet (100 mg total) by mouth daily.   metoprolol  tartrate (LOPRESSOR ) 25 MG tablet Take 0.5 tablets (12.5 mg total) by mouth 2 (two) times daily.   Multiple Vitamin (MULTIVITAMIN) tablet Take 1 tablet by mouth daily.   nystatin  cream (MYCOSTATIN ) Apply 1 Application topically 2 (two) times daily as needed.   triamcinolone  cream (KENALOG ) 0.1 % Apply 1 application topically daily as needed (itching).    [DISCONTINUED] Cholecalciferol (VITAMIN D ) 50 MCG (2000 UT) CAPS Take 1 capsule by mouth 3 (three) times a week. (Patient not taking: Reported on 09/18/2024)   No facility-administered encounter medications on file as of 09/18/2024.    History: Past Medical History:  Diagnosis Date   Acute perichondritis of pinna    Allergic rhinitis due to pollen    Disorder of bone and cartilage, unspecified    Encounter for long-term (current) use of other medications     History of echocardiogram    Echo 9/18: EF 65-70   History of exercise stress test 06/2017   ETT 9/18: no ischemia; hypertensive BP response   HLD (hyperlipidemia)    Osteoarthritis    Panic disorder    Unspecified dermatitis due to sun    Unspecified hypothyroidism    Past Surgical History:  Procedure Laterality Date   BREAST BIOPSY Right 07/14/2016   BREAST BIOPSY Right 07/15/2016   cataract surgery Bilateral 10/12/2016   childbirth     x2 blocks   COLONOSCOPY     x2   TONSILLECTOMY  1949   removed   Family History  Problem Relation Age of Onset   Stroke Mother    Heart failure Mother 63   Hypertension Mother    Heart attack Father 58   Hyperlipidemia Sister    Atrial fibrillation Sister    Cancer Sister        Stomach Area   Thyroid  disease Sister    Atrial fibrillation Brother    Breast cancer Maternal Grandmother 61   Thyroid  disease Daughter    Fibromyalgia Daughter    Heart disease Son    Thyroid  disease Son    Thyroid  disease Son    Colon cancer Neg Hx  Social History   Occupational History   Occupation: retired  Tobacco Use   Smoking status: Never   Smokeless tobacco: Never  Vaping Use   Vaping status: Never Used  Substance and Sexual Activity   Alcohol use: Yes    Alcohol/week: 2.0 standard drinks of alcohol    Types: 2 Glasses of wine per week   Drug use: No   Sexual activity: Yes   Tobacco Counseling Counseling given: Not Answered  SDOH Screenings   Food Insecurity: No Food Insecurity (08/21/2024)  Housing: Low Risk (08/21/2024)  Transportation Needs: No Transportation Needs (08/21/2024)  Alcohol Screen: Low Risk (08/21/2024)  Depression (PHQ2-9): Low Risk (09/18/2024)  Financial Resource Strain: Low Risk (08/21/2024)  Physical Activity: Sufficiently Active (08/21/2024)  Social Connections: Socially Integrated (08/21/2024)  Stress: Stress Concern Present (08/21/2024)  Tobacco Use: Low Risk (09/18/2024)   See flowsheets for full  screening details  Depression Screen PHQ 2 & 9 Depression Scale- Over the past 2 weeks, how often have you been bothered by any of the following problems? Little interest or pleasure in doing things: 0 Feeling down, depressed, or hopeless (PHQ Adolescent also includes...irritable): 0 PHQ-2 Total Score: 0     Goals Addressed   None          Objective:    Today's Vitals   09/18/24 1504  BP: 126/72  Pulse: 77  Resp: 18  Temp: (!) 96 F (35.6 C)  SpO2: 96%  Weight: 195 lb 9.6 oz (88.7 kg)  Height: 5' 5 (1.651 m)  PainSc: 0-No pain   Body mass index is 32.55 kg/m.  Hearing/Vision screen Hearing Screening - Comments:: Patient has no problem with hearing  Vision Screening - Comments:: Patient has no problem with vision Immunizations and Health Maintenance Health Maintenance  Topic Date Due   COVID-19 Vaccine (8 - 2025-26 season) 06/05/2024   Medicare Annual Wellness (AWV)  09/09/2024   DTaP/Tdap/Td (3 - Td or Tdap) 03/15/2028   Pneumococcal Vaccine: 50+ Years  Completed   Influenza Vaccine  Completed   Bone Density Scan  Completed   Zoster Vaccines- Shingrix  Completed   Meningococcal B Vaccine  Aged Out   Hepatitis B Vaccines 19-59 Average Risk  Discontinued   Hepatitis C Screening  Discontinued        Assessment/Plan:  This is a routine wellness examination for Aino.  Patient Care Team: Caro Harlene POUR, NP as PCP - General (Geriatric Medicine) Portia Fireman, OD (Optometry) Gaspar Kung, MD as Referring Physician (Orthopedic Surgery)  I have personally reviewed and noted the following in the patients chart:   Medical and social history Use of alcohol, tobacco or illicit drugs  Current medications and supplements including opioid prescriptions. Functional ability and status Nutritional status Physical activity Advanced directives List of other physicians Hospitalizations, surgeries, and ER visits in previous 12 months Vitals Screenings to  include cognitive, depression, and falls Referrals and appointments  No orders of the defined types were placed in this encounter.  In addition, I have reviewed and discussed with patient certain preventive protocols, quality metrics, and best practice recommendations. A written personalized care plan for preventive services as well as general preventive health recommendations were provided to patient.   Harlene POUR Caro, NP   09/18/2024   No follow-ups on file.  After Visit Summary: (In Person-Printed) AVS printed and given to the patient

## 2024-10-06 ENCOUNTER — Encounter: Payer: Self-pay | Admitting: Internal Medicine

## 2024-10-06 ENCOUNTER — Ambulatory Visit: Attending: Internal Medicine | Admitting: Internal Medicine

## 2024-10-06 ENCOUNTER — Ambulatory Visit

## 2024-10-06 VITALS — BP 145/78 | HR 51 | Ht 65.0 in | Wt 195.4 lb

## 2024-10-06 DIAGNOSIS — R001 Bradycardia, unspecified: Secondary | ICD-10-CM | POA: Diagnosis not present

## 2024-10-06 DIAGNOSIS — I10 Essential (primary) hypertension: Secondary | ICD-10-CM

## 2024-10-06 DIAGNOSIS — Z8673 Personal history of transient ischemic attack (TIA), and cerebral infarction without residual deficits: Secondary | ICD-10-CM

## 2024-10-06 MED ORDER — IRBESARTAN 300 MG PO TABS: 300.0000 mg | ORAL_TABLET | Freq: Every day | ORAL | 3 refills | Status: AC

## 2024-10-06 NOTE — Progress Notes (Unsigned)
 Enrolled patient for a 14 day Zio XT  monitor to be mailed to patients home

## 2024-10-06 NOTE — Progress Notes (Signed)
 " Cardiology Office Note:  .    Date:  10/06/2024  ID:  [name], DOB Jun 03, 1941, MRN [id number] PCP: Caro Harlene POUR, NP  [facility] Providers Cardiologist:  Stanly DELENA Leavens, MD     CC: TIA eval Consulted for the evaluation of HTN at the behest of Ms. Eubanks   History of Present Illness: Patricia Cantu    Patricia Cantu is a 84 y.o. female who presents with blood pressure fluctuations and muscle aches.  She recently visited the emergency department on October 26th, where multiple tests were conducted, ruling out a stroke. She was advised to follow up to ensure her carotid arteries are functioning properly.  She has experienced episodes of high blood pressure, noting that it was elevated yesterday, prompting her to take an extra dose of her blood pressure medication. She is currently taking hydralazine  as needed, losartan , and metoprolol  tartrate, but is uncertain about the necessity of all three medications. She is uncertain about the necessity of all three medications.  She reports muscle aches occurring partway through the day and questions if these are related to her blood pressure medications. She has been on atorvastatin  for a long time to manage her cholesterol and mentions no prior muscle aches associated with it.  Her sleep has been poor, which she believes is affecting her overall well-being. She is reading a book on sleep to try and improve this.  No chest pain, pressure, or tightness recently. She has not had heart rhythm monitoring in the past.  Her family history is significant for atrial fibrillation, with both sisters and her oldest son affected. Her father died of a heart attack.  Discussed the use of AI scribe software for clinical note transcription with the patient, who gave verbal consent to proceed.   Relevant histories: .  Social  - from [place] ; when to [place]  [employer] ROS: As per HPI.   Studies Reviewed: .     Cardiac Studies & Procedures    ______________________________________________________________________________________________   STRESS TESTS  EXERCISE TOLERANCE TEST (ETT) 06/18/2017  Interpretation Summary  Blood pressure demonstrated a hypertensive response to exercise.  There was no ST segment deviation noted during stress.  No T wave inversion was noted during stress.  Negative, adequate stress test.   ECHOCARDIOGRAM  ECHOCARDIOGRAM COMPLETE 03/23/2023  Narrative ECHOCARDIOGRAM REPORT    Patient Name:   Patricia Cantu Date of Exam: 03/23/2023 Medical Rec #:  [id number]     Height:       65.0 in Accession #:    [id number]    Weight:       195.6 lb Date of Birth:  1941-05-14     BSA:          1.960 m Patient Age:    81 years      BP:           138/68 mmHg Patient Gender: F             HR:           59 bpm. Exam Location:  [address]  Procedure: 2D Echo, Cardiac Doppler and Color Doppler  Indications:    R01.1 Murmur  History:        Patient has prior history of Echocardiogram examinations, most recent 06/18/2017. Risk Factors:Dyslipidemia.  Sonographer:    Carl Coma RDCS Referring Phys: [id number] JESSICA K EUBANKS  IMPRESSIONS   1. Left ventricular ejection fraction, by estimation, is 65 to 70%. The left ventricle has normal  function. The left ventricle has no regional wall motion abnormalities. There is mild left ventricular hypertrophy. Left ventricular diastolic parameters were normal. 2. Right ventricular systolic function is normal. The right ventricular size is normal. There is normal pulmonary artery systolic pressure. The estimated right ventricular systolic pressure is 29.4 mmHg. 3. The mitral valve is normal in structure. Trivial mitral valve regurgitation. No evidence of mitral stenosis. 4. The aortic valve is tricuspid. Aortic valve regurgitation is trivial. Aortic valve sclerosis is present, with no evidence of aortic valve stenosis. 5. The inferior vena cava is normal in  size with greater than 50% respiratory variability, suggesting right atrial pressure of 3 mmHg.  FINDINGS Left Ventricle: Left ventricular ejection fraction, by estimation, is 65 to 70%. The left ventricle has normal function. The left ventricle has no regional wall motion abnormalities. The left ventricular internal cavity size was normal in size. There is mild left ventricular hypertrophy. Left ventricular diastolic parameters were normal.  Right Ventricle: The right ventricular size is normal. No increase in right ventricular wall thickness. Right ventricular systolic function is normal. There is normal pulmonary artery systolic pressure. The tricuspid regurgitant velocity is 2.57 m/s, and with an assumed right atrial pressure of 3 mmHg, the estimated right ventricular systolic pressure is 29.4 mmHg.  Left Atrium: Left atrial size was normal in size.  Right Atrium: Right atrial size was normal in size.  Pericardium: Trivial pericardial effusion is present.  Mitral Valve: The mitral valve is normal in structure. Trivial mitral valve regurgitation. No evidence of mitral valve stenosis.  Tricuspid Valve: The tricuspid valve is normal in structure. Tricuspid valve regurgitation is trivial.  Aortic Valve: The aortic valve is tricuspid. Aortic valve regurgitation is trivial. Aortic valve sclerosis is present, with no evidence of aortic valve stenosis.  Pulmonic Valve: The pulmonic valve was not well visualized. Pulmonic valve regurgitation is not visualized.  Aorta: The aortic root and ascending aorta are structurally normal, with no evidence of dilitation.  Venous: The inferior vena cava is normal in size with greater than 50% respiratory variability, suggesting right atrial pressure of 3 mmHg.  IAS/Shunts: The interatrial septum was not well visualized.   LEFT VENTRICLE PLAX 2D LVIDd:         3.50 cm   Diastology LVIDs:         2.20 cm   LV e' medial:    7.13 cm/s LV PW:         1.20  cm   LV E/e' medial:  11.2 LV IVS:        1.20 cm   LV e' lateral:   6.26 cm/s LVOT diam:     1.90 cm   LV E/e' lateral: 12.7 LV SV:         66 LV SV Index:   34 LVOT Area:     2.84 cm   RIGHT VENTRICLE             IVC RV Basal diam:  3.60 cm     IVC diam: 1.40 cm RV S prime:     11.90 cm/s TAPSE (M-mode): 2.4 cm  LEFT ATRIUM             Index        RIGHT ATRIUM           Index LA diam:        3.80 cm 1.94 cm/m   RA Area:     12.20 cm LA Vol (A2C):   23.5  ml 11.99 ml/m  RA Volume:   32.10 ml  16.38 ml/m LA Vol (A4C):   39.6 ml 20.21 ml/m LA Biplane Vol: 32.4 ml 16.53 ml/m AORTIC VALVE LVOT Vmax:   95.60 cm/s LVOT Vmean:  62.550 cm/s LVOT VTI:    0.234 m  AORTA Ao Root diam: 3.40 cm Ao Asc diam:  3.20 cm  MITRAL VALVE               TRICUSPID VALVE MV Area (PHT): 3.75 cm    TR Peak grad:   26.4 mmHg MV Decel Time: 203 msec    TR Vmax:        257.00 cm/s MV E velocity: 79.65 cm/s MV A velocity: 69.95 cm/s  SHUNTS MV E/A ratio:  1.14        Systemic VTI:  0.23 m Systemic Diam: 1.90 cm  Lonni Nanas MD Electronically signed by Lonni Nanas MD Signature Date/Time: 03/23/2023/10:50:40 AM    Final          ______________________________________________________________________________________________       Physical Exam:    VS:  BP (!) 145/78   Pulse (!) 51   Ht 5' 5 (1.651 m)   Wt 195 lb 6.4 oz (88.6 kg)   SpO2 96%   BMI 32.52 kg/m    Wt Readings from Last 3 Encounters:  10/06/24 195 lb 6.4 oz (88.6 kg)  09/18/24 195 lb 9.6 oz (88.7 kg)  08/21/24 192 lb 3.2 oz (87.2 kg)    Gen:  No distress   Neck: No JVD Cardiac: No Rubs or Gallops, no Murmur, regular bradycardia, +2 radial pulses Respiratory: Clear to auscultation bilaterally, normal effort, normal  respiratory rate GI: Soft, nontender, non-distended  MS: No  edema;  moves all extremities Integument: Skin feels warm Neuro:  At time of evaluation, alert and oriented to  person/place/time/situation  Psych: Normal affect, patient feels ok  ASSESSMENT AND PLAN: .    Nonobstructive carotid artery disease and aortic atherosclerosis for secondary stroke prevention (query or TIA in November) Minimal nonobstructive carotid artery disease and aortic atherosclerosis identified on CT scan. No significant coronary artery calcifications. Cholesterol levels well-managed as of August 2024. Family history of atrial fibrillation increases risk for stroke and TIA. No prior heart rhythm monitoring performed. - Ordered two-week heart monitor to evaluate for atrial fibrillation or atrial flutter. - Continue atorvastatin  10 mg PO daily unless myalgias persist, then pause atorvastatin  and consider alternative medication. - Continue aspirin for secondary stroke prevention.  Hypertension Blood pressure readings show stage 1 hypertension with occasional elevations to 155 mmHg. Current regimen includes losartan , hydralazine , and metoprolol . Blood pressure not labile but requires optimization. Metoprolol  may not be necessary if blood pressure is controlled and heart monitor shows no arrhythmias. - Switched losartan  to irbesartan 300 mg for improved blood pressure control. - Ordered BMP in two weeks to monitor kidney function and electrolytes. - Scheduled follow-up in three months to review blood pressure control and heart monitor results.  Sinus bradycardia Persistent sinus bradycardia noted on previous EKGs. Asymptomatic with no significant bradycardia-related symptoms. Metoprolol  may contribute to bradycardia but is not currently discontinued. - Continue to monitor heart rate and symptoms. - Will consider discontinuing metoprolol  if heart monitor shows no arrhythmias and blood pressure is well controlled.  Hyperlipidemia Cholesterol levels well-managed as of August 2024. Atorvastatin  used for cholesterol management and secondary stroke prevention. Myalgias reported but not clearly  linked to atorvastatin . - Continue atorvastatin  10 mg PO daily unless myalgias persist,  then pause atorvastatin  and consider alternative medication.  Stanly Leavens, MD FASE [facility] Cardiologist [facility]  [address] [place], [address] [place], [place] [address] [phone]  12:56 PM  "

## 2024-10-06 NOTE — Patient Instructions (Signed)
 Medication Instructions:  Your physician has recommended you make the following change in your medication:  STOP: Losartan   START: irbesartan (Avapro) 300 mg by mouth once daily  *If you need a refill on your cardiac medications before your next appointment, please call your pharmacy*  Lab Work: BMP: in 2 weeks at any [facility]  If you have labs (blood work) drawn today and your tests are completely normal, you will receive your results only by: MyChart Message (if you have MyChart) OR A paper copy in the mail If you have any lab test that is abnormal or we need to change your treatment, we will call you to review the results.  Testing/Procedures: Your physician has requested that you wear a heart monitor.   Follow-Up: At [facility], you and your health needs are our priority.  As part of our continuing mission to provide you with exceptional heart care, our providers are all part of one team.  This team includes your primary Cardiologist (physician) and Advanced Practice Providers or APPs (Physician Assistants and Nurse Practitioners) who all work together to provide you with the care you need, when you need it.  Your next appointment:   3 month(s)  Provider:   Stanly DELENA Leavens, MD or One of our Advanced Practice Providers (APPs): Morse Clause, PA-C  Lamarr Satterfield, NP Miriam Shams, NP  Olivia Pavy, PA-C Josefa Beauvais, NP  Leontine Salen, PA-C Orren Fabry, PA-C  [place], PA-C Ernest Dick, NP  Damien Braver, NP Jon Hails, PA-C  Waddell Donath, PA-C    Dayna Dunn, PA-C  Scott Weaver, PA-C Lum Louis, NP Katlyn West, NP Callie Goodrich, PA-C  Xika Zhao, NP Sheng Haley, PA-C    Kathleen Johnson, PA-C     Other Instructions GEOFFRY HEWS- Long Term Monitor Instructions  Your physician has requested you wear a ZIO patch monitor for 14 days.  This is a single patch monitor. [employer] supplies one patch monitor per enrollment. Additional stickers are not  available. Please do not apply patch if you will be having a Nuclear Stress Test,  Echocardiogram, Cardiac CT, MRI, or Chest Xray during the period you would be wearing the  monitor. The patch cannot be worn during these tests. You cannot remove and re-apply the  ZIO XT patch monitor.  Your ZIO patch monitor will be mailed 3 day USPS to your address on file. It may take 3-5 days  to receive your monitor after you have been enrolled.  Once you have received your monitor, please review the enclosed instructions. Your monitor  has already been registered assigning a specific monitor serial # to you.  Billing and Patient Assistance Program Information  We have supplied [employer] with any of your insurance information on file for billing purposes. [employer] offers a sliding scale Patient Assistance Program for patients that do not have  insurance, or whose insurance does not completely cover the cost of the ZIO monitor.  You must apply for the Patient Assistance Program to qualify for this discounted rate.  To apply, please call [employer] at [phone], select option 4, select option 2, ask to apply for  Patient Assistance Program. Meredeth will ask your household income, and how many people  are in your household. They will quote your out-of-pocket cost based on that information.  [employer] will also be able to set up a 27-month, interest-free payment plan if needed.  Applying the monitor   Shave hair from upper left chest.  Hold abrader disc by  orange tab. Rub abrader in 40 strokes over the upper left chest as  indicated in your monitor instructions.  Clean area with 4 enclosed alcohol pads. Let dry.  Apply patch as indicated in monitor instructions. Patch will be placed under collarbone on left  side of chest with arrow pointing upward.  Rub patch adhesive wings for 2 minutes. Remove white label marked 1. Remove the white  label marked 2. Rub patch adhesive wings for 2 additional minutes.   While looking in a mirror, press and release button in center of patch. A small green light will  flash 3-4 times. This will be your only indicator that the monitor has been turned on.  Do not shower for the first 24 hours. You may shower after the first 24 hours.  Press the button if you feel a symptom. You will hear a small click. Record Date, Time and  Symptom in the Patient Logbook.  When you are ready to remove the patch, follow instructions on the last 2 pages of Patient  Logbook. Stick patch monitor onto the last page of Patient Logbook.  Place Patient Logbook in the blue and white box. Use locking tab on box and tape box closed  securely. The blue and white box has prepaid postage on it. Please place it in the mailbox as  soon as possible. Your physician should have your test results approximately 7 days after the  monitor has been mailed back to [facility].  Call [facility] Customer Care at [phone] if you have questions regarding  your ZIO XT patch monitor. Call them immediately if you see an orange light blinking on your  monitor.  If your monitor falls off in less than 4 days, contact our Monitor department at [phone].  If your monitor becomes loose or falls off after 4 days call [employer] at [phone] for  suggestions on securing your monitor

## 2024-10-09 ENCOUNTER — Ambulatory Visit (INDEPENDENT_AMBULATORY_CARE_PROVIDER_SITE_OTHER): Admitting: Nurse Practitioner

## 2024-10-09 ENCOUNTER — Encounter: Payer: Self-pay | Admitting: Nurse Practitioner

## 2024-10-09 VITALS — BP 132/78 | HR 61 | Temp 96.9°F | Resp 18 | Ht 65.0 in | Wt 195.4 lb

## 2024-10-09 DIAGNOSIS — I83813 Varicose veins of bilateral lower extremities with pain: Secondary | ICD-10-CM | POA: Diagnosis not present

## 2024-10-09 DIAGNOSIS — G4700 Insomnia, unspecified: Secondary | ICD-10-CM

## 2024-10-09 DIAGNOSIS — F411 Generalized anxiety disorder: Secondary | ICD-10-CM

## 2024-10-09 DIAGNOSIS — E039 Hypothyroidism, unspecified: Secondary | ICD-10-CM

## 2024-10-09 DIAGNOSIS — F410 Panic disorder [episodic paroxysmal anxiety] without agoraphobia: Secondary | ICD-10-CM

## 2024-10-09 DIAGNOSIS — E785 Hyperlipidemia, unspecified: Secondary | ICD-10-CM | POA: Diagnosis not present

## 2024-10-09 DIAGNOSIS — I10 Essential (primary) hypertension: Secondary | ICD-10-CM | POA: Diagnosis not present

## 2024-10-09 NOTE — Progress Notes (Signed)
 "   Careteam: Patient Care Team: Caro Harlene POUR, NP as PCP - General (Geriatric Medicine) Santo Stanly LABOR, MD as PCP - Cardiology (Cardiology) Portia Fireman, [place] (Optometry) Gaspar Kung, MD as Referring Physician (Orthopedic Surgery)  PLACE OF SERVICE:  [facility]  Advanced Directive information    Allergies[1]  Chief Complaint  Patient presents with   Medical Management of Chronic Issues    4 weeks follow-up, need covid vaccine    HPI:  Discussed the use of AI scribe software for clinical note transcription with the patient, who gave verbal consent to proceed.  History of Present Illness Patricia Cantu is an 84 year old female who presents for a follow-up on her blood pressure management and cardiology consultation.  Her blood pressure has been well-controlled recently on Avapro  after discontinuing losartan . Metoprolol  is still being used despite concerns about its contribution to bradycardia- cardiology has ordered event monitor to monitor HR.   She experiences body aches during the day, which she suspects might be related to lack of sleep. She has been working on improving her sleep hygiene and recently slept six hours straight. She previously tried Trazodone  for sleep but found it ineffective and discontinued it after a week.  She mentions experiencing symptoms suggestive of restless leg syndrome, similar to her sister's condition. She has been performing yoga exercises, such as the child's pose, which she finds helpful in alleviating the symptoms.  She has varicose veins, primarily in her right leg, which are painful at times. Her mother also had varicose veins.  She has been on Celexa  for anxiety for 25 years and finds it helpful, especially during stressful events.   She is also on Lipitor for cholesterol management   Synthroid  for thyroid  function, both of which have been stable in recent tests.  No dizziness or lightheadedness.   Review of Systems:   Review of Systems  Constitutional:  Negative for chills, fever and weight loss.  HENT:  Negative for tinnitus.   Respiratory:  Negative for cough, sputum production and shortness of breath.   Cardiovascular:  Negative for chest pain, palpitations and leg swelling.  Gastrointestinal:  Negative for abdominal pain, constipation, diarrhea and heartburn.  Genitourinary:  Negative for dysuria, frequency and urgency.  Musculoskeletal:  Negative for back pain, falls, joint pain and myalgias.  Skin: Negative.   Neurological:  Negative for dizziness and headaches.  Psychiatric/Behavioral:  Negative for depression and memory loss. The patient does not have insomnia.     Past Medical History:  Diagnosis Date   Acute perichondritis of pinna    Allergic rhinitis due to pollen    Disorder of bone and cartilage, unspecified    Encounter for long-term (current) use of other medications    History of echocardiogram    Echo 9/18: EF 65-70   History of exercise stress test 06/2017   ETT 9/18: no ischemia; hypertensive BP response   HLD (hyperlipidemia)    Osteoarthritis    Panic disorder    Unspecified dermatitis due to sun    Unspecified hypothyroidism    Past Surgical History:  Procedure Laterality Date   BREAST BIOPSY Right 07/14/2016   BREAST BIOPSY Right 07/15/2016   cataract surgery Bilateral 10/12/2016   childbirth     x2 blocks   COLONOSCOPY     x2   TONSILLECTOMY  1949   removed   Social History:   reports that she has never smoked. She has never used smokeless tobacco. She reports current alcohol  use of about 2.0 standard drinks of alcohol per week. She reports that she does not use drugs.  Family History  Problem Relation Age of Onset   Stroke Mother    Heart failure Mother 19   Hypertension Mother    Heart attack Father 77   Hyperlipidemia Sister    Atrial fibrillation Sister    Cancer Sister        Stomach Area   Thyroid  disease Sister    Atrial fibrillation  Brother    Breast cancer Maternal Grandmother 22   Thyroid  disease Daughter    Fibromyalgia Daughter    Heart disease Son    Thyroid  disease Son    Thyroid  disease Son    Colon cancer Neg Hx     Medications: Patient's Medications  New Prescriptions   No medications on file  Previous Medications   ACETAMINOPHEN (TYLENOL) 650 MG CR TABLET    Take 1,300 mg by mouth in the morning and at bedtime.   ASPIRIN EC 81 MG TABLET    Take 81 mg by mouth daily. Swallow whole.   ATORVASTATIN  (LIPITOR) 10 MG TABLET    TAKE 1 TABLET BY MOUTH DAILY FOR CHOLESTEROL   CALCIUM  PO    Take 1,000 mg by mouth daily.    CHOLECALCIFEROL (VITAMIN D3) 125 MCG (5000 UT) CAPS    Take one capsule by mouth once daily.   CITALOPRAM  (CELEXA ) 20 MG TABLET    TAKE 1 TABLET (20 MG TOTAL) BY MOUTH DAILY.   DICLOFENAC  SODIUM (VOLTAREN ) 1 % GEL    Apply 4 g topically 4 (four) times daily as needed.   FAMOTIDINE  (PEPCID ) 10 MG TABLET    Take 1 tablet (10 mg total) by mouth 2 (two) times daily as needed for heartburn or indigestion.   FISH OIL-OMEGA-3 FATTY ACIDS 1000 MG CAPSULE    Take 1 g by mouth daily.    FLUTICASONE  (FLONASE ) 50 MCG/ACT NASAL SPRAY    Place 1 spray into both nostrils daily.   HYDRALAZINE  (APRESOLINE ) 10 MG TABLET    Take 1 tablet (10 mg total) by mouth 2 (two) times daily as needed. For SBP >= 160   IRBESARTAN  (AVAPRO ) 300 MG TABLET    Take 1 tablet (300 mg total) by mouth daily.   KETOCONAZOLE (NIZORAL) 2 % CREAM    Apply 1 Application topically daily. As needed   LEVOTHYROXINE  (SYNTHROID ) 50 MCG TABLET    TAKE 1 TABLET BY MOUTH ONCE DAILY 30 MINUTES PRIOR TO BREAKFAST FOR THYROID    LORATADINE  (CLARITIN ) 10 MG TABLET    Take 1 tablet (10 mg total) by mouth daily as needed for allergies.   METOPROLOL  TARTRATE (LOPRESSOR ) 25 MG TABLET    Take 0.5 tablets (12.5 mg total) by mouth 2 (two) times daily.   MULTIPLE VITAMIN (MULTIVITAMIN) TABLET    Take 1 tablet by mouth daily.   NYSTATIN  CREAM (MYCOSTATIN )     Apply 1 Application topically 2 (two) times daily as needed.   TRAZODONE  (DESYREL ) 50 MG TABLET    Take 0.5 tablets (25 mg total) by mouth at bedtime as needed for sleep.   TRIAMCINOLONE  CREAM (KENALOG ) 0.1 %    Apply 1 application topically daily as needed (itching).   Modified Medications   No medications on file  Discontinued Medications   No medications on file    Physical Exam:  Vitals:   10/09/24 0911  BP: 132/78  Pulse: 61  Resp: 18  Temp: (!) 96.9 F (36.1 C)  SpO2: 97%  Weight: 195 lb 6.4 oz (88.6 kg)  Height: 5' 5 (1.651 m)   Body mass index is 32.52 kg/m. Wt Readings from Last 3 Encounters:  10/09/24 195 lb 6.4 oz (88.6 kg)  10/06/24 195 lb 6.4 oz (88.6 kg)  09/18/24 195 lb 9.6 oz (88.7 kg)    Physical Exam Constitutional:      General: She is not in acute distress.    Appearance: She is well-developed. She is not diaphoretic.  HENT:     Head: Normocephalic and atraumatic.     Mouth/Throat:     Pharynx: No oropharyngeal exudate.  Eyes:     Conjunctiva/sclera: Conjunctivae normal.     Pupils: Pupils are equal, round, and reactive to light.  Cardiovascular:     Rate and Rhythm: Normal rate and regular rhythm.     Heart sounds: Normal heart sounds.  Pulmonary:     Effort: Pulmonary effort is normal.     Breath sounds: Normal breath sounds.  Abdominal:     General: Bowel sounds are normal.     Palpations: Abdomen is soft.  Musculoskeletal:     Cervical back: Normal range of motion and neck supple.     Right lower leg: No edema.     Left lower leg: No edema.  Skin:    General: Skin is warm and dry.  Neurological:     Mental Status: She is alert.  Psychiatric:        Mood and Affect: Mood normal.     Labs reviewed: Basic Metabolic Panel: Recent Labs    05/12/24 0826 05/29/24 0836 07/30/24 1358  NA 135 135 137  K 4.6 4.7 4.2  CL 99 100 103  CO2 29 29 25   GLUCOSE 87 91 95  BUN 20 22 22   CREATININE 1.02* 1.01* 1.02*  CALCIUM  9.0 9.0 8.8*   TSH 2.36  --   --    Liver Function Tests: Recent Labs    05/12/24 0826  AST 17  ALT 14  BILITOT 0.8  PROT 6.2   No results for input(s): LIPASE, AMYLASE in the last 8760 hours. No results for input(s): AMMONIA in the last 8760 hours. CBC: Recent Labs    05/12/24 0826 05/29/24 0836 07/30/24 1358  WBC 6.3 6.7 8.6  NEUTROABS 3,383 4,047 5.8  HGB 13.5 13.3 13.5  HCT 40.9 40.1 40.6  MCV 98.8 98.0 97.4  PLT 287 279 268   Lipid Panel: Recent Labs    05/12/24 0826 05/29/24 0836  CHOL 173 161  HDL 75 66  LDLCALC 79 74  TRIG 105 127  CHOLHDL 2.3 2.4   TSH: Recent Labs    05/12/24 0826  TSH 2.36   A1C: Lab Results  Component Value Date   HGBA1C 5.2 03/20/2020     Assessment/Plan   Assessment & Plan Primary hypertension Blood pressure well-controlled. Cardiologist recommended Avapro  for better control.  - Continue Avapro  for blood pressure control. - Continue metoprolol  with monitoring for bradycardia.  14-day heart monitoring per cardiology  Varicose veins of bilateral lower extremities with pain Varicose veins with pain, more in right leg. Support stockings provide relief. Discussed potential referral to vascular specialist. - Use thigh-high support stockings for symptom relief. - Consider referral to vascular specialist for further evaluation and management.  Insomnia Improvement with lifestyle modifications.  - Continue lifestyle modifications for sleep improvement.  Generalized anxiety disorder Anxiety well-managed on Celexa  20 mg.  - Continue Celexa  20 mg daily.  Hyperlipidemia Cholesterol  levels well-controlled on Lipitor. - Continue Lipitor for cholesterol management.  Hypothyroidism Thyroid  function stable on Synthroid  50 mcg. - Continue Synthroid  50 mcg daily.  Return in about 6 months (around 04/08/2025) for routine follow up, labs prior to visit.:   Patricia Cantu K. Caro BODILY [facility] [phone]       [1]  Allergies Allergen Reactions   Shellfish Allergy Diarrhea and Nausea And Vomiting   "

## 2024-10-09 NOTE — Patient Instructions (Addendum)
 SABRA

## 2024-10-11 ENCOUNTER — Other Ambulatory Visit: Payer: Self-pay | Admitting: Nurse Practitioner

## 2024-10-11 ENCOUNTER — Telehealth: Payer: Self-pay

## 2024-10-11 DIAGNOSIS — Z1231 Encounter for screening mammogram for malignant neoplasm of breast: Secondary | ICD-10-CM

## 2024-10-11 DIAGNOSIS — M25552 Pain in left hip: Secondary | ICD-10-CM

## 2024-10-11 NOTE — Telephone Encounter (Signed)
 Please call pt and clarify exactly what she needs ortho referral for

## 2024-10-11 NOTE — Telephone Encounter (Signed)
 Copied from CRM [id number]. Topic: Referral - Request for Referral >> Oct 11, 2024  3:39 PM Susanna ORN wrote: Did the patient discuss referral with their provider in the last year? No (If No - schedule appointment) (If Yes - send message)  Appointment offered? Yes but patient states she was just in office on 10/09/24  Type of order/referral and detailed reason for visit: referral to Ortho  Preference of office, provider, location: Ortho on [address] in [place], [place]  If referral order, have you been seen by this specialty before? N/A (If Yes, this issue or another issue? When? Where?  Can we respond through MyChart? Yes

## 2024-10-12 NOTE — Telephone Encounter (Signed)
 Patient returned call and expressed that she has experienced left hip pain/discomfort since October 2025. Patient has tried homeopathic methods to treat pain with minimal relief (heat therapy). Patient had to stop exercising as it aggravates pain

## 2024-10-12 NOTE — Telephone Encounter (Signed)
Noted, referral has been placed

## 2024-10-12 NOTE — Telephone Encounter (Signed)
 Opened in error

## 2024-10-12 NOTE — Telephone Encounter (Signed)
 Left message for patient to return call to office to clarify the need for the ortho referral.

## 2024-10-18 ENCOUNTER — Other Ambulatory Visit: Payer: Self-pay | Admitting: Nurse Practitioner

## 2024-10-18 DIAGNOSIS — E785 Hyperlipidemia, unspecified: Secondary | ICD-10-CM

## 2024-10-20 LAB — BASIC METABOLIC PANEL WITH GFR
BUN/Creatinine Ratio: 22 (ref 12–28)
BUN: 22 mg/dL (ref 8–27)
CO2: 23 mmol/L (ref 20–29)
Calcium: 9.1 mg/dL (ref 8.7–10.3)
Chloride: 98 mmol/L (ref 96–106)
Creatinine, Ser: 0.98 mg/dL (ref 0.57–1.00)
Glucose: 108 mg/dL — ABNORMAL HIGH (ref 70–99)
Potassium: 4.4 mmol/L (ref 3.5–5.2)
Sodium: 135 mmol/L (ref 134–144)
eGFR: 58 mL/min/1.73 — ABNORMAL LOW

## 2024-10-23 ENCOUNTER — Ambulatory Visit: Payer: Self-pay

## 2024-10-24 ENCOUNTER — Ambulatory Visit
Admission: RE | Admit: 2024-10-24 | Discharge: 2024-10-24 | Disposition: A | Source: Ambulatory Visit | Attending: Nurse Practitioner | Admitting: Nurse Practitioner

## 2024-10-24 DIAGNOSIS — Z1231 Encounter for screening mammogram for malignant neoplasm of breast: Secondary | ICD-10-CM

## 2024-11-01 DIAGNOSIS — I10 Essential (primary) hypertension: Secondary | ICD-10-CM | POA: Diagnosis not present

## 2024-11-01 DIAGNOSIS — R001 Bradycardia, unspecified: Secondary | ICD-10-CM | POA: Diagnosis not present

## 2024-11-01 DIAGNOSIS — Z8673 Personal history of transient ischemic attack (TIA), and cerebral infarction without residual deficits: Secondary | ICD-10-CM | POA: Diagnosis not present

## 2024-12-13 ENCOUNTER — Other Ambulatory Visit (HOSPITAL_BASED_OUTPATIENT_CLINIC_OR_DEPARTMENT_OTHER)

## 2024-12-27 ENCOUNTER — Ambulatory Visit: Admitting: Emergency Medicine

## 2025-04-20 ENCOUNTER — Other Ambulatory Visit

## 2025-04-23 ENCOUNTER — Ambulatory Visit: Admitting: Nurse Practitioner

## 2025-05-16 ENCOUNTER — Other Ambulatory Visit: Payer: Self-pay

## 2025-05-18 ENCOUNTER — Ambulatory Visit: Payer: Self-pay | Admitting: Nurse Practitioner

## 2025-09-21 ENCOUNTER — Ambulatory Visit: Admitting: Nurse Practitioner
